# Patient Record
Sex: Female | Born: 1945
Health system: Southern US, Community
[De-identification: ages and names within clinical notes are randomized; demographics above are authoritative.]

## PROBLEM LIST (undated history)

## (undated) DIAGNOSIS — K861 Other chronic pancreatitis: Secondary | ICD-10-CM

## (undated) DIAGNOSIS — T4145XA Adverse effect of unspecified anesthetic, initial encounter: Secondary | ICD-10-CM

## (undated) DIAGNOSIS — T7840XA Allergy, unspecified, initial encounter: Secondary | ICD-10-CM

## (undated) DIAGNOSIS — E119 Type 2 diabetes mellitus without complications: Secondary | ICD-10-CM

## (undated) DIAGNOSIS — Z9889 Other specified postprocedural states: Secondary | ICD-10-CM

## (undated) DIAGNOSIS — T8859XA Other complications of anesthesia, initial encounter: Secondary | ICD-10-CM

## (undated) DIAGNOSIS — R112 Nausea with vomiting, unspecified: Secondary | ICD-10-CM

## (undated) DIAGNOSIS — J45909 Unspecified asthma, uncomplicated: Secondary | ICD-10-CM

## (undated) DIAGNOSIS — Z8739 Personal history of other diseases of the musculoskeletal system and connective tissue: Secondary | ICD-10-CM

## (undated) DIAGNOSIS — I10 Essential (primary) hypertension: Secondary | ICD-10-CM

## (undated) DIAGNOSIS — K859 Acute pancreatitis without necrosis or infection, unspecified: Secondary | ICD-10-CM

## (undated) DIAGNOSIS — E785 Hyperlipidemia, unspecified: Secondary | ICD-10-CM

## (undated) DIAGNOSIS — K219 Gastro-esophageal reflux disease without esophagitis: Secondary | ICD-10-CM

## (undated) HISTORY — PX: ABDOMINAL HYSTERECTOMY: SHX81

## (undated) HISTORY — DX: Hyperlipidemia, unspecified: E78.5

## (undated) HISTORY — PX: APPENDECTOMY: SHX54

## (undated) HISTORY — DX: Essential (primary) hypertension: I10

## (undated) HISTORY — PX: CHOLECYSTECTOMY: SHX55

## (undated) HISTORY — DX: Gastro-esophageal reflux disease without esophagitis: K21.9

## (undated) HISTORY — DX: Other chronic pancreatitis: K86.1

## (undated) HISTORY — DX: Allergy, unspecified, initial encounter: T78.40XA

## (undated) HISTORY — DX: Personal history of other diseases of the musculoskeletal system and connective tissue: Z87.39

## (undated) HISTORY — DX: Type 2 diabetes mellitus without complications: E11.9

## (undated) HISTORY — DX: Unspecified asthma, uncomplicated: J45.909

## (undated) HISTORY — PX: COLONOSCOPY: SHX174

---
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
## 1998-04-27 ENCOUNTER — Ambulatory Visit (HOSPITAL_COMMUNITY): Admission: RE | Admit: 1998-04-27 | Discharge: 1998-04-27 | Payer: Self-pay | Admitting: Internal Medicine

## 1998-04-27 ENCOUNTER — Encounter: Payer: Self-pay | Admitting: Internal Medicine

## 1998-04-28 ENCOUNTER — Emergency Department (HOSPITAL_COMMUNITY): Admission: EM | Admit: 1998-04-28 | Discharge: 1998-04-28 | Payer: Self-pay

## 1998-05-20 ENCOUNTER — Encounter: Admission: RE | Admit: 1998-05-20 | Discharge: 1998-06-12 | Payer: Self-pay | Admitting: *Deleted

## 2003-03-22 HISTORY — PX: OTHER SURGICAL HISTORY: SHX169

## 2003-05-05 ENCOUNTER — Encounter: Admission: RE | Admit: 2003-05-05 | Discharge: 2003-05-05 | Payer: Self-pay | Admitting: Family Medicine

## 2004-05-20 ENCOUNTER — Ambulatory Visit: Payer: Self-pay

## 2004-06-03 ENCOUNTER — Ambulatory Visit: Payer: Self-pay | Admitting: Oncology

## 2012-06-25 ENCOUNTER — Other Ambulatory Visit: Payer: Self-pay | Admitting: Family Medicine

## 2012-06-30 ENCOUNTER — Other Ambulatory Visit: Payer: Self-pay | Admitting: Family Medicine

## 2012-07-02 ENCOUNTER — Telehealth: Payer: Self-pay | Admitting: Physician Assistant

## 2012-07-02 MED ORDER — LISINOPRIL 20 MG PO TABS: 20.0000 mg | ORAL_TABLET | Freq: Every day | ORAL | Status: DC

## 2012-07-02 NOTE — Telephone Encounter (Signed)
Medication refilled per protocol.Patient needs to be seen before any further refills 

## 2012-09-25 ENCOUNTER — Other Ambulatory Visit: Payer: Self-pay | Admitting: Physician Assistant

## 2012-10-31 ENCOUNTER — Encounter: Payer: Self-pay | Admitting: Family Medicine

## 2012-10-31 ENCOUNTER — Other Ambulatory Visit: Payer: Self-pay | Admitting: Physician Assistant

## 2012-11-23 ENCOUNTER — Other Ambulatory Visit: Payer: Self-pay | Admitting: Physician Assistant

## 2012-11-23 NOTE — Telephone Encounter (Signed)
Medication refilled per protocol. 

## 2012-11-26 ENCOUNTER — Encounter: Payer: Self-pay | Admitting: Family Medicine

## 2012-11-28 ENCOUNTER — Encounter: Payer: Self-pay | Admitting: Physician Assistant

## 2012-11-28 ENCOUNTER — Ambulatory Visit (INDEPENDENT_AMBULATORY_CARE_PROVIDER_SITE_OTHER): Payer: Medicare Other | Admitting: Physician Assistant

## 2012-11-28 VITALS — BP 152/86 | HR 80 | Temp 98.0°F | Resp 18 | Ht 64.0 in | Wt 174.0 lb

## 2012-11-28 DIAGNOSIS — E119 Type 2 diabetes mellitus without complications: Secondary | ICD-10-CM

## 2012-11-28 DIAGNOSIS — K219 Gastro-esophageal reflux disease without esophagitis: Secondary | ICD-10-CM

## 2012-11-28 DIAGNOSIS — Z862 Personal history of diseases of the blood and blood-forming organs and certain disorders involving the immune mechanism: Secondary | ICD-10-CM

## 2012-11-28 DIAGNOSIS — T7840XA Allergy, unspecified, initial encounter: Secondary | ICD-10-CM | POA: Insufficient documentation

## 2012-11-28 DIAGNOSIS — Z8739 Personal history of other diseases of the musculoskeletal system and connective tissue: Secondary | ICD-10-CM

## 2012-11-28 DIAGNOSIS — E785 Hyperlipidemia, unspecified: Secondary | ICD-10-CM

## 2012-11-28 DIAGNOSIS — J45909 Unspecified asthma, uncomplicated: Secondary | ICD-10-CM | POA: Insufficient documentation

## 2012-11-28 DIAGNOSIS — E559 Vitamin D deficiency, unspecified: Secondary | ICD-10-CM

## 2012-11-28 DIAGNOSIS — I10 Essential (primary) hypertension: Secondary | ICD-10-CM

## 2012-11-28 LAB — COMPLETE METABOLIC PANEL WITH GFR
ALT: 21 U/L (ref 0–35)
CO2: 27 mEq/L (ref 19–32)
Calcium: 9.7 mg/dL (ref 8.4–10.5)
Chloride: 103 mEq/L (ref 96–112)
Creat: 0.68 mg/dL (ref 0.50–1.10)
GFR, Est African American: >89 mL/min
GFR, Est Non African American: >89 mL/min
Glucose, Bld: 109 mg/dL — ABNORMAL HIGH (ref 70–99)
Total Bilirubin: 0.4 mg/dL (ref 0.3–1.2)
Total Protein: 6.9 g/dL (ref 6.0–8.3)

## 2012-11-28 LAB — LIPID PANEL
HDL: 50 mg/dL (ref >39)
LDL Cholesterol: 68 mg/dL (ref 0–99)
Triglycerides: 363 mg/dL — ABNORMAL HIGH (ref <150)

## 2012-11-28 LAB — MICROALBUMIN, URINE: Microalb, Ur: 4.95 mg/dL — ABNORMAL HIGH (ref 0.00–1.89)

## 2012-11-28 LAB — HEMOGLOBIN A1C: Hgb A1c MFr Bld: 6.1 % — ABNORMAL HIGH (ref <5.7)

## 2012-11-28 NOTE — Progress Notes (Signed)
Patient ID: Jennifer Porter MRN: [id number], DOB: Sep 17, 1945, 67 y.o. Date of Encounter: @DATE @  Chief Complaint:  Chief Complaint  Patient presents with  . 6 month follow up    is fasting    HPI: 67 y.o. year old white obese female  presents for routine followup office visit.  Her last regular office visit with with me on 11/25/2011. She says that she did retire this past March. 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 She says that she does walk for 15-20 minutes once a day either in the morning or the evening depending on the weather etc. As well she is staying active with taking care of her chickens and also gardening and landscaping. As well she says that she is careful with her diet. She says she really has never been one to " snack and munch" and tries to watch what she does eat for meals.  She states that she checks her blood pressure periodically. She usually gets good readings. She states that she has not yet taken her medication today.  She has no complaints today. She has no chest pressure tightness or heaviness with her 20 minute walk.   Past Medical History  Diagnosis Date  . Hypertension   . GERD (gastroesophageal reflux disease)     hiatal hernia  . Diabetes mellitus without complication   . Allergy     seasonal  . Asthma   . H/O: gout   . Hyperlipidemia      Home Meds: See attached medication section for current medication list. Any medications entered into computer today will not appear on this note's list. The medications listed below were entered prior to today. Current Outpatient Prescriptions on File Prior to Visit  Medication Sig Dispense Refill  . Choline Fenofibrate (FENOFIBRIC ACID) 135 MG CPDR TAKE ONE CAPSULE BY MOUTH AT BEDTIME  90 capsule  0  . lisinopril (PRINIVIL,ZESTRIL) 20 MG tablet TAKE ONE TABLET   90 tablet  0   No current facility-administered medications on file prior to visit.    Allergies:  Allergies  Allergen Reactions   . Codeine Nausea And Vomiting  . Macrobid [Nitrofurantoin] Hives    History   Social History  . Marital Status: Single    Spouse Name: N/A    Number of Children: N/A  . Years of Education: N/A   Occupational History  . Not on file.   Social History Main Topics  . Smoking status: Never Smoker   . Smokeless tobacco: Never Used  . Alcohol Use: No  . Drug Use: No  . Sexual Activity: Not on file   Other Topics Concern  . Not on file   Social History Narrative   Retired March 2014.   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 Stroke Mother   . Diabetes Brother   . Cancer Neg Hx   . Heart disease Neg Hx      Review of Systems:  See HPI for pertinent ROS. All other ROS negative.    Physical Exam: Blood pressure 152/86, pulse 80, temperature 98 F (36.7 C), temperature source Oral, resp. rate 18, height 5\' 4"  (1.626 m), weight 174 lb (78.926 kg)., Body mass index is 29.85 kg/(m^2). General: Obese white female .Appears in no acute distress. Neck: Supple. No thyromegaly. No lymphadenopathy. No carotid bruits. Lungs: Clear bilaterally to auscultation without wheezes, rales, or rhonchi. Breathing is unlabored. Heart: RRR with  S1 S2. No murmurs, rubs, or gallops. Abdomen: Soft, non-tender, non-distended with normoactive bowel sounds. No hepatomegaly. No rebound/guarding. No obvious abdominal masses. Musculoskeletal:  Strength and tone normal for age. Extremities/Skin: Warm and dry. No clubbing or cyanosis. No edema. No rashes or suspicious lesions. Neuro: Alert and oriented X 3. Moves all extremities spontaneously. Gait is normal. CNII-XII grossly in tact. Psych:  Responds to questions appropriately with a normal affect. Eidetic for exam: See attached section. Inspection is normal. Sensation is normal there is trace to no palpable dorsalis pedis or posterior tibial pulses bilaterally.      ASSESSMENT AND PLAN:  67 y.o.  year old female with  1. Diabetes mellitus without complication - COMPLETE METABOLIC PANEL WITH GFR - Hemoglobin A1c - Microalbumin, urine-- last microalbumin was May 2013  She has no symptoms of peripheral neuropathy. Discuss diabetic eye exam with her at her next visit with me.  2. Hypertension I discussed with her the blood pressure reading today is elevated. However her last reading here have been good. As well she checks it regularly at home and gets good readings. She has not yet taken her medication today the last dose was greater than 24 hours ago. We'll monitor for now. Blood pressure elevated again at the next office visit then we'll increase medicines. - COMPLETE METABOLIC PANEL WITH GFR  3. Hyperlipidemia - COMPLETE METABOLIC PANEL WITH GFR - Lipid panel  4. GERD (gastroesophageal reflux disease)  5. H/O: gout  6. Vitamin D deficiency - Vit D  25 hydroxy (rtn osteoporosis monitoring)  7 hormone replacement therapy: We have discussed risk multiple times including cardiovascular risk blood clot risk and risk of breast and uterine cancer. She is aware of risk. Has opted to continue the medication. Without the medication she has severe hot flashes and other symptoms.  #8 she has a history of a hysterectomy. This was not performed secondary to any cancer. Therefore she does not need repeat Pap smear.  #9 mammogram: She says she had this performed in the spring of 2014. He did have to go back for a repeat test but this was negative.  10 DEXA scan: August 2011: Normal  11 colonoscopy: July 2008. Repeat 10 years.  12: Immunizations: Pneumovax given here November 2012 I discussed Fosamax with her November 2012. Have discussed it again since then. She kept forgetting to check with insurance regarding coverage. Today she is afraid that the insurance may have changed because of her recent retirement. She will contact the insurance again and then followup with [place] regarding  this  May 2013 CBC and TSH were performed and both were normal.  Needs to have regular office visit in 3 months.  Murray Hodgkins [place], [place], [facility] 11/28/2012 2:59 PM

## 2012-11-29 LAB — VITAMIN D 25 HYDROXY (VIT D DEFICIENCY, FRACTURES): Vit D, 25-Hydroxy: 45 ng/mL (ref 30–89)

## 2012-12-13 ENCOUNTER — Other Ambulatory Visit: Payer: Self-pay | Admitting: Physician Assistant

## 2012-12-13 NOTE — Telephone Encounter (Signed)
Medication refilled per protocol. 

## 2012-12-24 ENCOUNTER — Telehealth: Payer: Self-pay | Admitting: Family Medicine

## 2012-12-24 MED ORDER — OMEPRAZOLE 20 MG PO CPDR: 20.0000 mg | DELAYED_RELEASE_CAPSULE | Freq: Every day | ORAL | Status: DC

## 2012-12-24 MED ORDER — DICLOFENAC SODIUM ER 100 MG PO TB24: 100.0000 mg | ORAL_TABLET | Freq: Every day | ORAL | Status: DC

## 2012-12-24 NOTE — Telephone Encounter (Signed)
done

## 2012-12-24 NOTE — Telephone Encounter (Signed)
May refill both medications for one year.   

## 2012-12-24 NOTE — Telephone Encounter (Signed)
Pt has Diclofenac XR 100mg   One daily PRN       And Omeprazole 20mg  one daily as needed  Pt is requesting refills

## 2012-12-25 ENCOUNTER — Other Ambulatory Visit: Payer: Self-pay | Admitting: Physician Assistant

## 2012-12-25 NOTE — Telephone Encounter (Signed)
Medication refilled per protocol. 

## 2013-01-07 ENCOUNTER — Other Ambulatory Visit: Payer: Self-pay | Admitting: Family Medicine

## 2013-01-07 NOTE — Telephone Encounter (Signed)
Pt transferring all her medications to [facility] from [facility].  Does not need refills now. Just want to make  Pharmacy her Primary Pharmacy  DONE

## 2013-01-10 ENCOUNTER — Telehealth: Payer: Self-pay | Admitting: Family Medicine

## 2013-01-10 NOTE — Telephone Encounter (Signed)
Patient has transferred her presciptions for Metformin,Fenofibrate, Fluticasone NS and Gabapentin to [facility]

## 2013-01-11 ENCOUNTER — Telehealth: Payer: Self-pay | Admitting: Physician Assistant

## 2013-01-11 MED ORDER — GABAPENTIN 300 MG PO CAPS: 300.0000 mg | ORAL_CAPSULE | Freq: Two times a day (BID) | ORAL | Status: DC

## 2013-01-11 MED ORDER — METFORMIN HCL 500 MG PO TABS: 500.0000 mg | ORAL_TABLET | Freq: Two times a day (BID) | ORAL | Status: DC

## 2013-01-11 NOTE — Telephone Encounter (Signed)
gabapentin refilled  All her other med's have been refilled in the past month

## 2013-01-11 NOTE — Telephone Encounter (Signed)
Patient needs all meds refills except Metformin it has already been filled.  [facility]  [phone]

## 2013-01-11 NOTE — Telephone Encounter (Signed)
Patient needs her metformin 500 mg. To be called in to [facility]   [phone]

## 2013-01-11 NOTE — Telephone Encounter (Signed)
Done

## 2013-01-15 ENCOUNTER — Other Ambulatory Visit: Payer: Self-pay | Admitting: Family Medicine

## 2013-01-15 ENCOUNTER — Telehealth: Payer: Self-pay | Admitting: Family Medicine

## 2013-01-15 MED ORDER — FENOFIBRIC ACID 135 MG PO CPDR: 1.0000 | DELAYED_RELEASE_CAPSULE | Freq: Every day | ORAL | Status: DC

## 2013-01-15 MED ORDER — FLUTICASONE PROPIONATE 50 MCG/ACT NA SUSP: 2.0000 | Freq: Every day | NASAL | Status: DC

## 2013-01-15 NOTE — Telephone Encounter (Deleted)
[facility]  . They are needing the correct SIG: for

## 2013-01-15 NOTE — Telephone Encounter (Signed)
Medication refilled per protocol. 

## 2013-02-22 ENCOUNTER — Ambulatory Visit (INDEPENDENT_AMBULATORY_CARE_PROVIDER_SITE_OTHER): Payer: Medicare Other | Admitting: Family Medicine

## 2013-02-22 DIAGNOSIS — Z23 Encounter for immunization: Secondary | ICD-10-CM

## 2013-04-18 ENCOUNTER — Ambulatory Visit (INDEPENDENT_AMBULATORY_CARE_PROVIDER_SITE_OTHER): Payer: Medicare Other | Admitting: Physician Assistant

## 2013-04-18 ENCOUNTER — Encounter: Payer: Self-pay | Admitting: Physician Assistant

## 2013-04-18 VITALS — BP 164/94 | HR 96 | Temp 97.8°F | Resp 18 | Wt 174.0 lb

## 2013-04-18 DIAGNOSIS — R0789 Other chest pain: Secondary | ICD-10-CM

## 2013-04-18 DIAGNOSIS — R071 Chest pain on breathing: Secondary | ICD-10-CM

## 2013-04-18 MED ORDER — DICLOFENAC SODIUM ER 100 MG PO TB24: 100.0000 mg | ORAL_TABLET | Freq: Every day | ORAL | Status: DC | PRN

## 2013-04-18 NOTE — Progress Notes (Signed)
Patient ID: Jennifer Porter MRN: [id number], DOB: 03-Oct-1945, 68 y.o. Date of Encounter: @DATE @  Chief Complaint:  Chief Complaint  Patient presents with  . c/o chest pain x 2 days    ??reflux  head neck,back  pain  took Prilosec no help    HPI: 68 y.o. year old obese white female  presents with following symptoms:  She states that for about one week she was having some discomfort in her abdomen and had been using some Prilosec and Gaviscon for this. Then, she states that on Tuesday evening (04/16/2013), she started to also have discomfort into her chest. Because of the symptoms she had been having over the past week,  she felt this must be heartburn so she took Prilosec. However she got no relief with this.  On the following day, Wednesday, she changed and took Prevacid and also decreased her eating to eating just crackers.  However, the pain still did not improve. She states that there has been some amount of discomfort across her chest constantly ever since Tuesday evening. The pain has not been coming and going, but has been constant. She also notes that on Tuesday she took 2 walks with no problems. Yesterday  she was afraid to exert herself as much but she still walked for 5 or 10 minutes. She states  that the chest discomfort was the same while she was doing that exertion as it had been at rest. She had no increased chest discomfort and no increased dyspnea on exertion with walking.   Past Medical History  Diagnosis Date  . Hypertension   . GERD (gastroesophageal reflux disease)     hiatal hernia  . Diabetes mellitus without complication   . Allergy     seasonal  . Asthma   . H/O: gout   . Hyperlipidemia      Home Meds: See attached medication section for current medication list. Any medications entered into computer today will not appear on this note's list. The medications listed below were entered prior to today. Current Outpatient Prescriptions on File Prior to  Visit  Medication Sig Dispense Refill  . aspirin 81 MG tablet Take 81 mg by mouth daily.      . calcium carbonate (OS-CAL) 600 MG TABS tablet Take 600 mg by mouth daily with breakfast.      . Choline Fenofibrate (FENOFIBRIC ACID) 135 MG CPDR Take 1 capsule by mouth at bedtime.  90 capsule  1  . lisinopril (PRINIVIL,ZESTRIL) 20 MG tablet Take 1 tablet (20 mg total) by mouth daily.  90 tablet  1  . metFORMIN (GLUCOPHAGE) 500 MG tablet Take 1 tablet (500 mg total) by mouth 2 (two) times daily with a meal.  60 tablet  5  . Multiple Vitamin (MULTIVITAMIN) tablet Take 1 tablet by mouth daily.      Marland Kitchen omeprazole (PRILOSEC) 20 MG capsule Take 1 capsule (20 mg total) by mouth daily.  30 capsule  6  . PREMARIN 0.625 MG tablet TAKE ONE TABLET BY MOUTH EVERY DAY  30 tablet  11   No current facility-administered medications on file prior to visit.    Allergies:  Allergies  Allergen Reactions  . Codeine Nausea And Vomiting  . Macrobid [Nitrofurantoin] Hives    History   Social History  . Marital Status: Single    Spouse Name: N/A    Number of Children: N/A  . Years of Education: N/A   Occupational History  . Not on file.  Social History Main Topics  . Smoking status: Never Smoker   . Smokeless tobacco: Never Used  . Alcohol Use: No  . Drug Use: No  . Sexual Activity: Not on file   Other Topics Concern  . Not on file   Social History Narrative   Retired March 2014.   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 Stroke Mother   . Diabetes Brother   . Cancer Neg Hx   . Heart disease Neg Hx      Review of Systems:  See HPI for pertinent ROS. All other ROS negative.    Physical Exam: Blood pressure 144/84, pulse 96, temperature 97.8 F (36.6 C), temperature source Oral, resp. rate 18, weight 174 lb (78.926 kg), SpO2 99.00%., Body mass index is 29.85 kg/(m^2). General: Obese WF. Appears in no acute distress. Neck: Supple. No  thyromegaly. No lymphadenopathy. Lungs: Clear bilaterally to auscultation without wheezes, rales, or rhonchi. Breathing is unlabored. Heart: RRR with S1 S2. No murmurs, rubs, or gallops. Chest Wall: Extremely tender with palpation of entire chest wall, bilaterally. Extreme tenderness throughout the right chest, above the level of the breast, as well as under the breast. Also tender in the peristernal area bilaterally. Also tender with palpation of the left chest wall. Also very tender with palpation of the periscapular area of her back. Abdomen: Soft, non-tender, non-distended with normoactive bowel sounds. No hepatomegaly. No rebound/guarding. No obvious abdominal masses. Musculoskeletal:  Strength and tone normal for age. Extremities/Skin: Warm and dry.  No rashes or suspicious lesions. Neuro: Alert and oriented X 3. Moves all extremities spontaneously. Gait is normal. CNII-XII grossly in tact. Psych:  Responds to questions appropriately with a normal affect.   12-lead EKG shows normal sinus rhythm with nonspecific ST-T changes. She does have poor anterior R-wave progression but this was present on her EKG in her chart dated 05/18/2004  ASSESSMENT AND PLAN:  68 y.o. year old female with  1. Chest wall pain I reviewed her medication list and includes diclofenac. However she states she has not been taking meds in quite some time. She says that she only uses that when she is having severe back pain. I told her to start taking this daily for the next week--take it with food. We discussed that she does have diabetes, hypertension, hyperlipidemia which are put her at increased risk for developing heart disease. However, I discussed that her symptoms and her physical exam are not consistent with this current chest pain being secondary to CAD. However, if she were to develop different symptoms with exertional chest discomfort or exertional dyspnea, then followup immediately.   - Diclofenac Sodium CR 100  MG 24 hr tablet; Take 1 tablet (100 mg total) by mouth daily as needed.  Dispense: 30 tablet; Refill: 2   Signed, [address] [place], [place], [facility] 04/18/2013 1:52 PM

## 2013-05-09 ENCOUNTER — Encounter: Payer: Self-pay | Admitting: Physician Assistant

## 2013-06-06 ENCOUNTER — Ambulatory Visit (INDEPENDENT_AMBULATORY_CARE_PROVIDER_SITE_OTHER): Payer: Medicare Other | Admitting: Physician Assistant

## 2013-06-06 ENCOUNTER — Encounter: Payer: Self-pay | Admitting: Physician Assistant

## 2013-06-06 VITALS — BP 154/86 | HR 80 | Temp 97.8°F | Resp 18 | Ht 63.25 in | Wt 168.0 lb

## 2013-06-06 DIAGNOSIS — E119 Type 2 diabetes mellitus without complications: Secondary | ICD-10-CM

## 2013-06-06 DIAGNOSIS — J45909 Unspecified asthma, uncomplicated: Secondary | ICD-10-CM

## 2013-06-06 DIAGNOSIS — I10 Essential (primary) hypertension: Secondary | ICD-10-CM

## 2013-06-06 DIAGNOSIS — Z862 Personal history of diseases of the blood and blood-forming organs and certain disorders involving the immune mechanism: Secondary | ICD-10-CM

## 2013-06-06 DIAGNOSIS — Z8639 Personal history of other endocrine, nutritional and metabolic disease: Secondary | ICD-10-CM

## 2013-06-06 DIAGNOSIS — Z8739 Personal history of other diseases of the musculoskeletal system and connective tissue: Secondary | ICD-10-CM

## 2013-06-06 DIAGNOSIS — K219 Gastro-esophageal reflux disease without esophagitis: Secondary | ICD-10-CM

## 2013-06-06 DIAGNOSIS — Z23 Encounter for immunization: Secondary | ICD-10-CM

## 2013-06-06 DIAGNOSIS — E785 Hyperlipidemia, unspecified: Secondary | ICD-10-CM

## 2013-06-06 DIAGNOSIS — T7840XA Allergy, unspecified, initial encounter: Secondary | ICD-10-CM

## 2013-06-06 LAB — LIPID PANEL
Cholesterol: 183 mg/dL (ref 0–200)
HDL: 45 mg/dL (ref >39)
LDL Cholesterol: 93 mg/dL (ref 0–99)
Total CHOL/HDL Ratio: 4.1 Ratio
Triglycerides: 226 mg/dL — ABNORMAL HIGH (ref <150)
VLDL: 45 mg/dL — ABNORMAL HIGH (ref 0–40)

## 2013-06-06 LAB — COMPLETE METABOLIC PANEL WITH GFR
ALBUMIN: 4.2 g/dL (ref 3.5–5.2)
ALT: 13 U/L (ref 0–35)
AST: 19 U/L (ref 0–37)
Alkaline Phosphatase: 16 U/L — ABNORMAL LOW (ref 39–117)
BUN: 13 mg/dL (ref 6–23)
CO2: 25 meq/L (ref 19–32)
Calcium: 9.6 mg/dL (ref 8.4–10.5)
Chloride: 102 mEq/L (ref 96–112)
Creat: 0.65 mg/dL (ref 0.50–1.10)
GLUCOSE: 121 mg/dL — AB (ref 70–99)
POTASSIUM: 4.3 meq/L (ref 3.5–5.3)
Sodium: 142 mEq/L (ref 135–145)
Total Bilirubin: 0.5 mg/dL (ref 0.2–1.2)
Total Protein: 6.5 g/dL (ref 6.0–8.3)

## 2013-06-06 LAB — HEMOGLOBIN A1C
HEMOGLOBIN A1C: 6.4 % — AB (ref <5.7)
MEAN PLASMA GLUCOSE: 137 mg/dL — AB (ref <117)

## 2013-06-06 MED ORDER — AMLODIPINE BESYLATE 5 MG PO TABS: 5.0000 mg | ORAL_TABLET | Freq: Every day | ORAL | Status: DC

## 2013-06-06 NOTE — Progress Notes (Signed)
Patient ID: Jennifer Porter MRN: [id number], DOB: 25-Dec-1945, 68 y.o. Date of Encounter: @DATE @  Chief Complaint:  Chief Complaint  Patient presents with  . 6 mth diab check    is fasting    HPI: 68 y.o. year old female  presents for [name].  She has no complaints today. 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 Today she says that she and her husband will be getting busier coming up soon--planting their garden. Says that he also plans to buy 6 more chickens. Says that she already has a 6 chickens.  As well she has been walking 15-20 minutes once a day either in the morning or the evening depending on the weather weather etc.  As well she has told me in the past that she is careful with her diet. Says that she has never really been one to snack or munch and tries to watch what she eats for meals.  She is taking her lisinopril as directed. No adverse effects and no lightheadedness or edema.  She reports that even with exertion she has no chest pressure heaviness tightness or increased dyspnea.   Past Medical History  Diagnosis Date  . Hypertension   . GERD (gastroesophageal reflux disease)     hiatal hernia  . Diabetes mellitus without complication   . Allergy     seasonal  . Asthma   . H/O: gout   . Hyperlipidemia      Home Meds: See attached medication section for current medication list. Any medications entered into computer today will not appear on this note's list. The medications listed below were entered prior to today. Current Outpatient Prescriptions on File Prior to Visit  Medication Sig Dispense Refill  . aspirin 81 MG tablet Take 81 mg by mouth daily.      . calcium carbonate (OS-CAL) 600 MG TABS tablet Take 600 mg by mouth daily with breakfast.      . Choline Fenofibrate (FENOFIBRIC ACID) 135 MG CPDR Take 1 capsule by mouth at bedtime.  90 capsule  1  . Diclofenac Sodium CR 100 MG 24 hr tablet Take 1 tablet (100 mg total) by  mouth daily as needed.  30 tablet  2  . fluticasone (FLONASE) 50 MCG/ACT nasal spray Place 2 sprays into the nose daily as needed.      . gabapentin (NEURONTIN) 300 MG capsule Take 300 mg by mouth daily.      Marland Kitchen lisinopril (PRINIVIL,ZESTRIL) 20 MG tablet Take 1 tablet (20 mg total) by mouth daily.  90 tablet  1  . metFORMIN (GLUCOPHAGE) 500 MG tablet Take 1 tablet (500 mg total) by mouth 2 (two) times daily with a meal.  60 tablet  5  . Multiple Vitamin (MULTIVITAMIN) tablet Take 1 tablet by mouth daily.      Marland Kitchen omeprazole (PRILOSEC) 20 MG capsule Take 1 capsule (20 mg total) by mouth daily.  30 capsule  6  . PREMARIN 0.625 MG tablet TAKE ONE TABLET BY MOUTH EVERY DAY  30 tablet  11   No current facility-administered medications on file prior to visit.    Allergies:  Allergies  Allergen Reactions  . Codeine Nausea And Vomiting  . Macrobid [Nitrofurantoin] Hives    History   Social History  . Marital Status: Single    Spouse Name: N/A    Number of Children: N/A  . Years of Education: N/A   Occupational History  . Not on file.  Social History Main Topics  . Smoking status: Never Smoker   . Smokeless tobacco: Never Used  . Alcohol Use: No  . Drug Use: No  . Sexual Activity: Not on file   Other Topics Concern  . Not on file   Social History Narrative   Retired March 2014.   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 Stroke Mother   . Diabetes Brother   . Cancer Neg Hx   . Heart disease Neg Hx      Review of Systems:  See HPI for pertinent ROS. All other ROS negative.    Physical Exam: Blood pressure 154/86, pulse 80, temperature 97.8 F (36.6 C), temperature source Oral, resp. rate 18, height 5' 3.25" (1.607 m), weight 168 lb (76.204 kg)., Body mass index is 29.51 kg/(m^2). General: Obese WF. Appears in no acute distress. Neck: Supple. No thyromegaly. No lymphadenopathy. No carotid bruits. Lungs: Clear  bilaterally to auscultation without wheezes, rales, or rhonchi. Breathing is unlabored. Heart: RRR with S1 S2. No murmurs, rubs, or gallops. Abdomen: Soft, non-tender, non-distended with normoactive bowel sounds. No hepatomegaly. No rebound/guarding. No obvious abdominal masses. Musculoskeletal:  Strength and tone normal for age. Extremities/Skin: Warm and dry. No clubbing or cyanosis. No edema. No rashes or suspicious lesions. Neuro: Alert and oriented X 3. Moves all extremities spontaneously. Gait is normal. CNII-XII grossly in tact. Psych:  Responds to questions appropriately with a normal affect. Diabetic Foot exam: Inspection is normal. Sensation is normal and intact. There is trace palpable dorsalis pedis or posterior tibial pulses bilaterally.      ASSESSMENT AND PLAN:  68 y.o. year old female with  1. Diabetes mellitus without complication  Last microalbumin was 11/2012.  She is on ACE inhibitor. Lisinopril 20 mg daily. She is on no statin. However last LDL is at 68 which is goal. She is on aspirin 81 mg daily.  She has no symptoms of peripheral neuropathy. She has had no diabetic eye exam in over 2 years. He is agreeable for me to do referral to, which is today.  - COMPLETE METABOLIC PANEL WITH GFR - Hemoglobin A1c - Ambulatory referral to Ophthalmology  2. Hypertension Blood Pressure was elevated at last visit and is elevated again today. Will add Norvasc 5 mg. Cont.  lisinopril 20 mg. - COMPLETE METABOLIC PANEL WITH GFR - amLODipine (NORVASC) 5 MG tablet; Take 1 tablet (5 mg total) by mouth daily.  Dispense: 30 tablet; Refill: 3  3. Hyperlipidemia On no Statin. Last LDL 68. - COMPLETE METABOLIC PANEL WITH GFR - Lipid panel  4. GERD (gastroesophageal reflux disease)  5. Allergy  6. Asthma  7. H/O: gout No Recent flare.  8. Hormone replacement therapy: We have discussed risk multiple times including cardiovascular risk of blood clots risk and risk of breast  cancer and uterine cancer. She is aware of risk. Has opted to continue the medication. Without the medication she has severe hot flashes and other symptoms.  9. Has history of hysterectomy. This was not performed secondary to any cancer. Therefore she does not need repeat Pap smear.  10. Mammogram: She had this performed in 2014. Aware to repeat when due this year.  11.  DEXA.: August 2011: Normal  12. Colonoscopy: July 2008. Repeat 10 years.  13. Immunizations: Pneumonia vaccine:              --Pneumovax was given here in November 2012. We'll update  this into the Epic system today.  --Give  Prevnar 13 today. Zostavax: Discussed this multiple times but she still has not followed up with this. Flu vaccine--N/A Tetanus: Will not give as she has Medicare  Can wait 6 months for followup visit if labs are all stable. Otherwise will need followup 3 months.  Marin Olp [place], [place], [facility] 06/06/2013 8:26 AM

## 2013-06-06 NOTE — Addendum Note (Signed)
Addended by: Olena Mater on: 06/06/2013 08:50 AM   Modules accepted: Orders

## 2013-06-07 ENCOUNTER — Other Ambulatory Visit: Payer: Self-pay | Admitting: Physician Assistant

## 2013-06-07 DIAGNOSIS — E119 Type 2 diabetes mellitus without complications: Secondary | ICD-10-CM

## 2013-06-07 NOTE — Telephone Encounter (Signed)
Medication refilled per protocol. 

## 2013-06-17 ENCOUNTER — Other Ambulatory Visit: Payer: Self-pay | Admitting: Physician Assistant

## 2013-06-17 NOTE — Telephone Encounter (Signed)
Refill appropriate and filled per protocol. 

## 2013-07-04 ENCOUNTER — Encounter: Payer: Self-pay | Admitting: Family Medicine

## 2013-07-04 LAB — HM DIABETES EYE EXAM

## 2013-07-17 ENCOUNTER — Encounter: Payer: Self-pay | Admitting: *Deleted

## 2013-08-13 ENCOUNTER — Other Ambulatory Visit: Payer: Self-pay | Admitting: Physician Assistant

## 2013-08-13 NOTE — Telephone Encounter (Signed)
Medication refilled per protocol. 

## 2013-09-03 ENCOUNTER — Other Ambulatory Visit: Payer: Self-pay | Admitting: Family Medicine

## 2013-09-28 ENCOUNTER — Other Ambulatory Visit: Payer: Self-pay | Admitting: Family Medicine

## 2013-09-28 DIAGNOSIS — Z79899 Other long term (current) drug therapy: Secondary | ICD-10-CM

## 2013-09-28 DIAGNOSIS — E785 Hyperlipidemia, unspecified: Secondary | ICD-10-CM

## 2013-09-28 DIAGNOSIS — E119 Type 2 diabetes mellitus without complications: Secondary | ICD-10-CM

## 2013-09-28 DIAGNOSIS — I10 Essential (primary) hypertension: Secondary | ICD-10-CM

## 2013-09-30 ENCOUNTER — Other Ambulatory Visit: Payer: Self-pay | Admitting: Physician Assistant

## 2013-11-28 ENCOUNTER — Other Ambulatory Visit: Payer: Self-pay | Admitting: Physician Assistant

## 2013-12-02 ENCOUNTER — Other Ambulatory Visit: Payer: Self-pay | Admitting: *Deleted

## 2013-12-02 MED ORDER — METFORMIN HCL 500 MG PO TABS: ORAL_TABLET | ORAL | Status: DC

## 2013-12-02 NOTE — Telephone Encounter (Signed)
Received call from patient.   Reports that she was informed that medication refill had been denied. States that she has appointment scheduled for next week. Appointment confirmed.   Prescription sent to pharmacy.

## 2013-12-05 ENCOUNTER — Other Ambulatory Visit: Payer: Medicare Other

## 2013-12-05 DIAGNOSIS — Z79899 Other long term (current) drug therapy: Secondary | ICD-10-CM

## 2013-12-05 DIAGNOSIS — E785 Hyperlipidemia, unspecified: Secondary | ICD-10-CM

## 2013-12-05 DIAGNOSIS — E119 Type 2 diabetes mellitus without complications: Secondary | ICD-10-CM

## 2013-12-05 DIAGNOSIS — I10 Essential (primary) hypertension: Secondary | ICD-10-CM

## 2013-12-05 LAB — CBC WITH DIFFERENTIAL/PLATELET
Basophils Absolute: 0.1 10*3/uL (ref 0.0–0.1)
Basophils Relative: 1 % (ref 0–1)
EOS ABS: 0.5 10*3/uL (ref 0.0–0.7)
EOS PCT: 7 % — AB (ref 0–5)
HCT: 38.2 % (ref 36.0–46.0)
Hemoglobin: 13.2 g/dL (ref 12.0–15.0)
LYMPHS ABS: 2.2 10*3/uL (ref 0.7–4.0)
Lymphocytes Relative: 28 % (ref 12–46)
MCH: 30.6 pg (ref 26.0–34.0)
MCHC: 34.6 g/dL (ref 30.0–36.0)
MCV: 88.4 fL (ref 78.0–100.0)
MONO ABS: 0.6 10*3/uL (ref 0.1–1.0)
Monocytes Relative: 8 % (ref 3–12)
Neutro Abs: 4.4 10*3/uL (ref 1.7–7.7)
Neutrophils Relative %: 56 % (ref 43–77)
PLATELETS: 287 10*3/uL (ref 150–400)
RBC: 4.32 MIL/uL (ref 3.87–5.11)
RDW: 13.6 % (ref 11.5–15.5)
WBC: 7.8 10*3/uL (ref 4.0–10.5)

## 2013-12-05 LAB — COMPREHENSIVE METABOLIC PANEL
ALT: 21 U/L (ref 0–35)
AST: 20 U/L (ref 0–37)
Albumin: 4.3 g/dL (ref 3.5–5.2)
Alkaline Phosphatase: 18 U/L — ABNORMAL LOW (ref 39–117)
BUN: 20 mg/dL (ref 6–23)
CHLORIDE: 104 meq/L (ref 96–112)
CO2: 22 mEq/L (ref 19–32)
CREATININE: 0.81 mg/dL (ref 0.50–1.10)
Calcium: 9.3 mg/dL (ref 8.4–10.5)
GLUCOSE: 128 mg/dL — AB (ref 70–99)
Potassium: 4.4 mEq/L (ref 3.5–5.3)
Sodium: 138 mEq/L (ref 135–145)
Total Bilirubin: 0.5 mg/dL (ref 0.2–1.2)
Total Protein: 6.7 g/dL (ref 6.0–8.3)

## 2013-12-05 LAB — LIPID PANEL
CHOLESTEROL: 174 mg/dL (ref 0–200)
HDL: 49 mg/dL (ref >39)
LDL Cholesterol: 76 mg/dL (ref 0–99)
TRIGLYCERIDES: 246 mg/dL — AB (ref <150)
Total CHOL/HDL Ratio: 3.6 Ratio
VLDL: 49 mg/dL — AB (ref 0–40)

## 2013-12-05 LAB — HEMOGLOBIN A1C
Hgb A1c MFr Bld: 6.5 % — ABNORMAL HIGH (ref <5.7)
Mean Plasma Glucose: 140 mg/dL — ABNORMAL HIGH (ref <117)

## 2013-12-09 ENCOUNTER — Ambulatory Visit: Payer: Self-pay | Admitting: Physician Assistant

## 2013-12-11 ENCOUNTER — Encounter: Payer: Self-pay | Admitting: Physician Assistant

## 2013-12-11 ENCOUNTER — Ambulatory Visit (INDEPENDENT_AMBULATORY_CARE_PROVIDER_SITE_OTHER): Payer: Medicare Other | Admitting: Physician Assistant

## 2013-12-11 VITALS — BP 126/72 | HR 96 | Temp 98.0°F | Resp 18 | Wt 169.0 lb

## 2013-12-11 DIAGNOSIS — K219 Gastro-esophageal reflux disease without esophagitis: Secondary | ICD-10-CM

## 2013-12-11 DIAGNOSIS — J45909 Unspecified asthma, uncomplicated: Secondary | ICD-10-CM

## 2013-12-11 DIAGNOSIS — J0100 Acute maxillary sinusitis, unspecified: Secondary | ICD-10-CM

## 2013-12-11 DIAGNOSIS — E119 Type 2 diabetes mellitus without complications: Secondary | ICD-10-CM

## 2013-12-11 DIAGNOSIS — I10 Essential (primary) hypertension: Secondary | ICD-10-CM

## 2013-12-11 DIAGNOSIS — Z862 Personal history of diseases of the blood and blood-forming organs and certain disorders involving the immune mechanism: Secondary | ICD-10-CM

## 2013-12-11 DIAGNOSIS — Z8739 Personal history of other diseases of the musculoskeletal system and connective tissue: Secondary | ICD-10-CM

## 2013-12-11 DIAGNOSIS — E785 Hyperlipidemia, unspecified: Secondary | ICD-10-CM

## 2013-12-11 DIAGNOSIS — Z8639 Personal history of other endocrine, nutritional and metabolic disease: Secondary | ICD-10-CM

## 2013-12-11 DIAGNOSIS — J4520 Mild intermittent asthma, uncomplicated: Secondary | ICD-10-CM

## 2013-12-11 LAB — MICROALBUMIN, URINE: Microalb, Ur: 6.7 mg/dL — ABNORMAL HIGH (ref <2.0)

## 2013-12-11 MED ORDER — AMOXICILLIN-POT CLAVULANATE 875-125 MG PO TABS: 1.0000 | ORAL_TABLET | Freq: Two times a day (BID) | ORAL | Status: DC

## 2013-12-11 NOTE — Progress Notes (Signed)
Patient ID: LISSETT FAVORITE MRN: [id number], DOB: 1946-01-17, 68 y.o. Date of Encounter: @DATE @  Chief Complaint:  Chief Complaint  Patient presents with  . 6 month follow up    has had labs    HPI: 68 y.o. year old female  presents for Icard.  Today 12/11/13 she states that she has had a sinus infection for about a week now. Says that she gets drainage down her throat that causes her to cough. Says that sometimes she is able to cough this out and is thick dark mucus. Says that she's been having a lot of pressure and pain in both of her cheek areas. No other complaints. Otherwise she has been doing well since her last visit here 06/06/13.  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  At [place] 05/2013 she said that she and her husband would be getting busier coming up soon--planting their garden. Said that they also planned to buy 6 more chickens. Said that she already had 6 chickens.  Today she says she often has been volunteering several days at the school helping out. Continues to be active outside in the yard.  As well, at office visit 11/2013 she says that she has continued to do her walking--been walking 15-20 minutes once a day either in the morning or the evening depending on the weather weather etc.  As well she has told me in the past that she is careful with her diet. Says that she has never really been one to snack or munch and tries to watch what she eats for meals.  She is taking her lisinopril as directed. No adverse effects and no lightheadedness or edema.  She reports that even with exertion she has no chest pressure heaviness tightness or increased dyspnea.   Past Medical History  Diagnosis Date  . Hypertension   . GERD (gastroesophageal reflux disease)     hiatal hernia  . Diabetes mellitus without complication   . Allergy     seasonal  . Asthma   . H/O: gout   . Hyperlipidemia      Home Meds:  Outpatient Prescriptions Prior to  Visit  Medication Sig Dispense Refill  . amLODipine (NORVASC) 5 MG tablet TAKE ONE (1) TABLET EACH DAY  30 tablet  2  . aspirin 81 MG tablet Take 81 mg by mouth daily.      . calcium carbonate (OS-CAL) 600 MG TABS tablet Take 600 mg by mouth daily with breakfast.      . Choline Fenofibrate (FENOFIBRIC ACID) 135 MG CPDR TAKE ONE CAPSULE BY MOUTH EVERY NIGHT AT BEDTIME  90 capsule  1  . Diclofenac Sodium CR 100 MG 24 hr tablet Take 1 tablet (100 mg total) by mouth daily as needed.  30 tablet  2  . fluticasone (FLONASE) 50 MCG/ACT nasal spray Place 2 sprays into the nose daily as needed.      . gabapentin (NEURONTIN) 300 MG capsule Take 300 mg by mouth daily.      Marland Kitchen lisinopril (PRINIVIL,ZESTRIL) 20 MG tablet TAKE ONE TABLET BY MOUTH ONCE DAILY  90 tablet  2  . metFORMIN (GLUCOPHAGE) 500 MG tablet TAKE ONE TABLET BY MOUTH TWICE A DAY WITH A MEAL  180 tablet  1  . Multiple Vitamin (MULTIVITAMIN) tablet Take 1 tablet by mouth daily.      Marland Kitchen omeprazole (PRILOSEC) 20 MG capsule Take 1 capsule (20 mg total) by mouth daily.  30 capsule  6  . PREMARIN 0.625 MG tablet TAKE ONE TABLET BY MOUTH EVERY DAY  30 tablet  11  . gabapentin (NEURONTIN) 300 MG capsule TAKE ONE (1) CAPSULE BY MOUTH TWICE A DAY. (EVERY 12 HOURS.)  60 capsule  3  . gabapentin (NEURONTIN) 300 MG capsule TAKE ONE (1) CAPSULE BY MOUTH TWICE A DAY. (EVERY 12 HOURS.)  60 capsule  3   No facility-administered medications prior to visit.     Allergies:  Allergies  Allergen Reactions  . Codeine Nausea And Vomiting  . Macrobid [Nitrofurantoin] Hives    History   Social History  . Marital Status: Single    Spouse Name: N/A    Number of Children: N/A  . Years of Education: N/A   Occupational History  . Not on file.   Social History Main Topics  . Smoking status: Never Smoker   . Smokeless tobacco: Never Used  . Alcohol Use: No  . Drug Use: No  . Sexual Activity: Not on file   Other Topics Concern  . Not on file   Social  History Narrative   Retired March 2014.   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 Stroke Mother   . Diabetes Brother   . Cancer Neg Hx   . Heart disease Neg Hx      Review of Systems:  See HPI for pertinent ROS. All other ROS negative.    Physical Exam: Blood pressure 126/72, pulse 96, temperature 98 F (36.7 C), temperature source Oral, resp. rate 18, weight 169 lb (76.658 kg)., Body mass index is 29.68 kg/(m^2). General: Obese WF. Appears in no acute distress. Neck: Supple. No thyromegaly. No lymphadenopathy. No carotid bruits. Lungs: Clear bilaterally to auscultation without wheezes, rales, or rhonchi. Breathing is unlabored. Heart: RRR with S1 S2. No murmurs, rubs, or gallops. Abdomen: Soft, non-tender, non-distended with normoactive bowel sounds. No hepatomegaly. No rebound/guarding. No obvious abdominal masses. Musculoskeletal:  Strength and tone normal for age. Extremities/Skin: Warm and dry. No clubbing or cyanosis. No edema. No rashes or suspicious lesions. Neuro: Alert and oriented X 3. Moves all extremities spontaneously. Gait is normal. CNII-XII grossly in tact. Psych:  Responds to questions appropriately with a normal affect. Diabetic Foot exam: Inspection is normal. Sensation is normal and intact. There is trace palpable dorsalis pedis or posterior tibial pulses bilaterally.      ASSESSMENT AND PLAN:  68 y.o. year old female with  1. Diabetes mellitus without complication  She recently came in for fasting labs. A1c is 6.5.  Last microalbumin was 11/2012. Repeat now--12/11/2013  She is on ACE inhibitor. Lisinopril 20 mg daily. She is on no statin. However last LDL is at 9 which is goal. She is on aspirin 81 mg daily.  She has no symptoms of peripheral neuropathy. At [place] 3/ 2015 she reported she had had no diabetic eye exam in over 2 years. She was agreeable for me to do referral -- office visit 12/11/13 she  reports that she did have an eye exam at [facility].    2. Hypertension Blood Pressure was elevated at last 2 office visits--therfore at [place]- 05/2013--added norvasc 5mg  QD.   Blood Pressure now at goal. CMET normal   3. Hyperlipidemia On no Statin. FLP 11/2013--LDL = 76  4. GERD (gastroesophageal reflux disease)  5. Allergy  6. Asthma  7. H/O: gout No Recent flare.  8. Hormone replacement therapy: We have discussed risk  multiple times including cardiovascular risk of blood clots risk and risk of breast cancer and uterine cancer. She is aware of risk. Has opted to continue the medication. Without the medication she has severe hot flashes and other symptoms.  9. Has history of hysterectomy. This was not performed secondary to any cancer. Therefore she does not need repeat Pap smear.  10. Mammogram: She had this performed in 2014. Aware to repeat when due this year.  11.  DEXA.: August 2011: Normal  12. Colonoscopy: July 2008. Repeat 10 years.  13. Immunizations: Pneumonia vaccine:              --Pneumovax was given here in November 2012.  -- Prevnar 13 given here 06/06/2013 Zostavax: Discussed this multiple times but she still has not followed up with this. Flu vaccine--Discussed at [place] 12/11/2013--she will return to get the influenza vaccine here in a couple weeks after her sinus infection has resolved.  Tetanus: Will not give as she has Medicare   14. Acute maxillary sinusitis, recurrence not specified - amoxicillin-clavulanate (AUGMENTIN) 875-125 MG per tablet; Take 1 tablet by mouth 2 (two) times daily.  Dispense: 20 tablet; Refill: 0 Discussed Prednisone for symptomatic relief but she defers.  Can wait 6 months for followup visit if labs are all stable. Otherwise will need followup 3 months.  Marin Olp [place], [place], [facility] 12/11/2013 8:24 AM

## 2014-01-06 ENCOUNTER — Other Ambulatory Visit: Payer: Self-pay | Admitting: Physician Assistant

## 2014-01-06 NOTE — Telephone Encounter (Signed)
Medication refilled per protocol. 

## 2014-02-03 ENCOUNTER — Other Ambulatory Visit: Payer: Self-pay | Admitting: Physician Assistant

## 2014-02-04 NOTE — Telephone Encounter (Signed)
Medication refilled per protocol. 

## 2014-03-12 ENCOUNTER — Encounter: Payer: Self-pay | Admitting: Physician Assistant

## 2014-03-12 ENCOUNTER — Ambulatory Visit (INDEPENDENT_AMBULATORY_CARE_PROVIDER_SITE_OTHER): Payer: Medicare Other | Admitting: Physician Assistant

## 2014-03-12 VITALS — BP 134/70 | HR 96 | Temp 97.7°F | Resp 18 | Wt 169.0 lb

## 2014-03-12 DIAGNOSIS — J4520 Mild intermittent asthma, uncomplicated: Secondary | ICD-10-CM

## 2014-03-12 DIAGNOSIS — Z8639 Personal history of other endocrine, nutritional and metabolic disease: Secondary | ICD-10-CM

## 2014-03-12 DIAGNOSIS — E785 Hyperlipidemia, unspecified: Secondary | ICD-10-CM

## 2014-03-12 DIAGNOSIS — E119 Type 2 diabetes mellitus without complications: Secondary | ICD-10-CM

## 2014-03-12 DIAGNOSIS — Z23 Encounter for immunization: Secondary | ICD-10-CM

## 2014-03-12 DIAGNOSIS — Z8739 Personal history of other diseases of the musculoskeletal system and connective tissue: Secondary | ICD-10-CM

## 2014-03-12 DIAGNOSIS — I10 Essential (primary) hypertension: Secondary | ICD-10-CM

## 2014-03-12 DIAGNOSIS — K219 Gastro-esophageal reflux disease without esophagitis: Secondary | ICD-10-CM

## 2014-03-12 LAB — HEMOGLOBIN A1C, FINGERSTICK: Hgb A1C (fingerstick): 6 % — ABNORMAL HIGH (ref <5.7)

## 2014-03-12 NOTE — Progress Notes (Signed)
Patient ID: Jennifer Porter MRN: [id number], DOB: 03-Sep-1945, 68 y.o. Date of Encounter: @DATE @  Chief Complaint:  Chief Complaint  Patient presents with  . 3 mth checkup    is fasting  . Flu Vaccine    HPI: 68 y.o. year old female  presents for ROV.   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  At [place] 05/2013 she said that she and her husband would be getting busier coming up soon--planting their garden. Said that they also planned to buy 6 more chickens. Said that she already had 6 chickens.  Today she says she often has been volunteering several days at the school helping out. Continues to be active outside in the yard.  As well, at office visit 11/2013 she says that she has continued to do her walking--been walking 15-20 minutes once a day either in the morning or the evening depending on the weather weather etc.  As well she has told me in the past that she is careful with her diet. Says that she has never really been one to snack or munch and tries to watch what she eats for meals.  She is taking her lisinopril as directed. No adverse effects and no lightheadedness or edema.  She reports that even with exertion she has no chest pressure heaviness tightness or increased dyspnea.   Past Medical History  Diagnosis Date  . Hypertension   . GERD (gastroesophageal reflux disease)     hiatal hernia  . Diabetes mellitus without complication   . Allergy     seasonal  . Asthma   . H/O: gout   . Hyperlipidemia      Home Meds:  Outpatient Prescriptions Prior to Visit  Medication Sig Dispense Refill  . amLODipine (NORVASC) 5 MG tablet TAKE ONE (1) TABLET EACH DAY 30 tablet 5  . aspirin 81 MG tablet Take 81 mg by mouth daily.    . calcium carbonate (OS-CAL) 600 MG TABS tablet Take 600 mg by mouth daily with breakfast.    . Choline Fenofibrate (FENOFIBRIC ACID) 135 MG CPDR TAKE ONE CAPSULE BY MOUTH AT BEDTIME 90 capsule 0  . Diclofenac Sodium  CR 100 MG 24 hr tablet Take 1 tablet (100 mg total) by mouth daily as needed. 30 tablet 2  . fluticasone (FLONASE) 50 MCG/ACT nasal spray Place 2 sprays into the nose daily as needed.    . gabapentin (NEURONTIN) 300 MG capsule Take 300 mg by mouth daily.    Marland Kitchen lisinopril (PRINIVIL,ZESTRIL) 20 MG tablet TAKE ONE TABLET BY MOUTH ONCE DAILY 90 tablet 2  . metFORMIN (GLUCOPHAGE) 500 MG tablet TAKE ONE TABLET BY MOUTH TWICE A DAY WITH A MEAL 180 tablet 1  . Multiple Vitamin (MULTIVITAMIN) tablet Take 1 tablet by mouth daily.    Marland Kitchen omeprazole (PRILOSEC) 20 MG capsule Take 1 capsule (20 mg total) by mouth daily. 30 capsule 6  . PREMARIN 0.625 MG tablet TAKE ONE (1) TABLET BY MOUTH EVERY DAY 30 tablet 5  . amoxicillin-clavulanate (AUGMENTIN) 875-125 MG per tablet Take 1 tablet by mouth 2 (two) times daily. 20 tablet 0   No facility-administered medications prior to visit.     Allergies:  Allergies  Allergen Reactions  . Codeine Nausea And Vomiting  . Macrobid [Nitrofurantoin] Hives    History   Social History  . Marital Status: Single    Spouse Name: N/A    Number of Children: N/A  . Years of  Education: N/A   Occupational History  . Not on file.   Social History Main Topics  . Smoking status: Never Smoker   . Smokeless tobacco: Never Used  . Alcohol Use: No  . Drug Use: No  . Sexual Activity: Not on file   Other Topics Concern  . Not on file   Social History Narrative   Retired March 2014.   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 Stroke Mother   . Diabetes Brother   . Cancer Neg Hx   . Heart disease Neg Hx      Review of Systems:  See HPI for pertinent ROS. All other ROS negative.    Physical Exam: Blood pressure 134/70, pulse 96, temperature 97.7 F (36.5 C), temperature source Oral, resp. rate 18, weight 169 lb (76.658 kg)., Body mass index is 29.68 kg/(m^2). General: Obese WF. Appears in no acute  distress. Neck: Supple. No thyromegaly. No lymphadenopathy. No carotid bruits. Lungs: Clear bilaterally to auscultation without wheezes, rales, or rhonchi. Breathing is unlabored. Heart: RRR with S1 S2. No murmurs, rubs, or gallops. Abdomen: Soft, non-tender, non-distended with normoactive bowel sounds. No hepatomegaly. No rebound/guarding. No obvious abdominal masses. Musculoskeletal:  Strength and tone normal for age. Extremities/Skin: Warm and dry. No clubbing or cyanosis. No edema. No rashes or suspicious lesions. Neuro: Alert and oriented X 3. Moves all extremities spontaneously. Gait is normal. CNII-XII grossly in tact. Psych:  Responds to questions appropriately with a normal affect. Diabetic Foot exam: Inspection is normal. Sensation is normal and intact. There is trace palpable dorsalis pedis or posterior tibial pulses bilaterally.      ASSESSMENT AND PLAN:  68 y.o. year old female with  1. Diabetes mellitus without complication  Check G4W now.  Last microalbumin was    12/11/2013  She is on ACE inhibitor. Lisinopril 20 mg daily. She is on no statin. However last LDL is at 76 which is goal. She is on aspirin 81 mg daily.  She has no symptoms of peripheral neuropathy. Summer 2015 -- she did have an eye exam at [place].                           Says that she was told that she had no diabetic eye disease.   2. Hypertension    Blood Pressure now at goal. CMET normal  11/2013  3. Hyperlipidemia On no Statin. FLP 11/2013--LDL = 76  4. GERD (gastroesophageal reflux disease)  5. Allergy  6. Asthma  7. H/O: gout No Recent flare.  8. Hormone replacement therapy: We have discussed risk multiple times including cardiovascular risk of blood clots risk and risk of breast cancer and uterine cancer. She is aware of risk. Has opted to continue the medication. Without the medication she has severe hot flashes and other symptoms.  9. Has history of hysterectomy. This was not  performed secondary to any cancer. Therefore she does not need repeat Pap smear.  10. Mammogram: Says that she has a mammogram scheduled for 04/03/2014  11.  DEXA.: August 2011: Normal  12. Colonoscopy: July 2008. Repeat 10 years.  13. Immunizations: Pneumonia vaccine:              --Pneumovax was given here in November 2012.  -- Prevnar 13 given here 06/06/2013 Zostavax: Discussed this multiple times but she still has not followed up with this. 02/2014---She states that  she is getting new insurance beginning in January. Says that when she gets this         new insurance coverage she will find out their coverage of the shingles vaccine then follow-up accordingly. Flu vaccine--Given here 03/12/2014 Tetanus: Will not give as she has Medicare   Can wait 6 months for followup visit if labs are all stable. Otherwise will need followup 3 months.  [address] [place], [place], [facility] 03/12/2014 8:57 AM

## 2014-03-24 ENCOUNTER — Ambulatory Visit (INDEPENDENT_AMBULATORY_CARE_PROVIDER_SITE_OTHER): Payer: Medicare Other | Admitting: Physician Assistant

## 2014-03-24 ENCOUNTER — Other Ambulatory Visit: Payer: Self-pay | Admitting: Physician Assistant

## 2014-03-24 ENCOUNTER — Encounter: Payer: Self-pay | Admitting: Physician Assistant

## 2014-03-24 VITALS — BP 126/70 | HR 88 | Temp 98.0°F | Resp 20 | Wt 169.0 lb

## 2014-03-24 DIAGNOSIS — N390 Urinary tract infection, site not specified: Secondary | ICD-10-CM | POA: Diagnosis not present

## 2014-03-24 DIAGNOSIS — R319 Hematuria, unspecified: Secondary | ICD-10-CM | POA: Diagnosis not present

## 2014-03-24 DIAGNOSIS — R309 Painful micturition, unspecified: Secondary | ICD-10-CM

## 2014-03-24 LAB — URINALYSIS, ROUTINE W REFLEX MICROSCOPIC
Bilirubin Urine: NEGATIVE
Glucose, UA: 100 mg/dL — AB
KETONES UR: NEGATIVE mg/dL
NITRITE: NEGATIVE
Protein, ur: NEGATIVE mg/dL
Specific Gravity, Urine: 1.015 (ref 1.005–1.030)
UROBILINOGEN UA: 0.2 mg/dL (ref 0.0–1.0)
pH: 5.5 (ref 5.0–8.0)

## 2014-03-24 LAB — URINALYSIS, MICROSCOPIC ONLY

## 2014-03-24 MED ORDER — LISINOPRIL 20 MG PO TABS
20.0000 mg | ORAL_TABLET | Freq: Every day | ORAL | Status: DC
Start: 2014-03-24 — End: 2014-12-15

## 2014-03-24 MED ORDER — CIPROFLOXACIN HCL 500 MG PO TABS: 500.0000 mg | ORAL_TABLET | Freq: Two times a day (BID) | ORAL | Status: DC

## 2014-03-24 NOTE — Progress Notes (Signed)
Patient ID: BRIANY AYE MRN: [id number], DOB: 02-09-1946, 69 y.o. Date of Encounter: 03/24/2014, 11:57 AM    Chief Complaint:  Chief Complaint  Patient presents with  . c/o UTI    pain, pressure, burning     HPI: 69 y.o. year old white female says that her symptoms started Saturday, January 2.  Says that since then-- the symptoms were worsening. She did start taking some cranberry which seems to have helped a little bit. Says that she is having a lot of urinary frequency. Constantly feels like she needs to go back and urinate more. Also some pressure. Burning only when she urinates. No back pain and no fevers or chills.     Home Meds:   Outpatient Prescriptions Prior to Visit  Medication Sig Dispense Refill  . amLODipine (NORVASC) 5 MG tablet TAKE ONE (1) TABLET EACH DAY 30 tablet 5  . aspirin 81 MG tablet Take 81 mg by mouth daily.    . calcium carbonate (OS-CAL) 600 MG TABS tablet Take 600 mg by mouth daily with breakfast.    . Choline Fenofibrate (FENOFIBRIC ACID) 135 MG CPDR TAKE ONE CAPSULE BY MOUTH AT BEDTIME 90 capsule 0  . Diclofenac Sodium CR 100 MG 24 hr tablet Take 1 tablet (100 mg total) by mouth daily as needed. 30 tablet 2  . fluticasone (FLONASE) 50 MCG/ACT nasal spray Place 2 sprays into the nose daily as needed.    . gabapentin (NEURONTIN) 300 MG capsule Take 300 mg by mouth daily.    . metFORMIN (GLUCOPHAGE) 500 MG tablet TAKE ONE TABLET BY MOUTH TWICE A DAY WITH A MEAL 180 tablet 1  . Multiple Vitamin (MULTIVITAMIN) tablet Take 1 tablet by mouth daily.    Marland Kitchen omeprazole (PRILOSEC) 20 MG capsule Take 1 capsule (20 mg total) by mouth daily. 30 capsule 6  . PREMARIN 0.625 MG tablet TAKE ONE (1) TABLET BY MOUTH EVERY DAY 30 tablet 5  . lisinopril (PRINIVIL,ZESTRIL) 20 MG tablet TAKE ONE TABLET BY MOUTH ONCE DAILY 90 tablet 2   No facility-administered medications prior to visit.    Allergies:  Allergies  Allergen Reactions  . Codeine Nausea And Vomiting  .  Macrobid [Nitrofurantoin] Hives      Review of Systems: See HPI for pertinent ROS. All other ROS negative.    Physical Exam: Blood pressure 126/70, pulse 88, temperature 98 F (36.7 C), temperature source Oral, resp. rate 20, weight 169 lb (76.658 kg)., Body mass index is 29.68 kg/(m^2). General:  WF. Appears in no acute distress. Lungs: Clear bilaterally to auscultation without wheezes, rales, or rhonchi. Breathing is unlabored. Heart: Regular rhythm. No murmurs, rubs, or gallops. Abdomen: Soft, non-distended with normoactive bowel sounds. No hepatomegaly. No rebound/guarding. No obvious abdominal masses. Minimal tenderness with palpation of suprapubic area.  Msk:  Strength and tone normal for age. No tenderness with percussion of costophrenic angles bilaterally. Extremities/Skin: Warm and dry. Neuro: Alert and oriented X 3. Moves all extremities spontaneously. Gait is normal. CNII-XII grossly in tact. Psych:  Responds to questions appropriately with a normal affect.   Results for orders placed or performed in visit on 03/24/14  Urinalysis, Routine w reflex microscopic  Result Value Ref Range   Color, Urine YELLOW YELLOW   APPearance CLEAR CLEAR   Specific Gravity, Urine 1.015 1.005 - 1.030   pH 5.5 5.0 - 8.0   Glucose, UA 100 (A) NEG mg/dL   Bilirubin Urine NEG NEG   Ketones, ur NEG NEG mg/dL  Hgb urine dipstick MOD (A) NEG   Protein, ur NEG NEG mg/dL   Urobilinogen, UA 0.2 0.0 - 1.0 mg/dL   Nitrite NEG NEG   Leukocytes, UA TRACE (A) NEG  Urine Microscopic  Result Value Ref Range   Squamous Epithelial / LPF MANY (A) RARE   Crystals Uric Acid crystals noted NONE SEEN   Casts SEE NOTE (A) NONE SEEN   WBC, UA 7-10 (A) <3 WBC/hpf   RBC / HPF 3-6 (A) <3 RBC/hpf   Bacteria, UA FEW (A) RARE     ASSESSMENT AND PLAN:  69 y.o. year old female with  1. Urinary tract infection with hematuria, site unspecified - ciprofloxacin (CIPRO) 500 MG tablet; Take 1 tablet (500 mg total)  by mouth 2 (two) times daily.  Dispense: 14 tablet; Refill: 0  2. Painful urination - Urinalysis, Routine w reflex microscopic  Take antibiotic as directed and complete all of it. Follow-up if symptoms worsen or do not resolve after completion of antibiotic.  Signed, [address] [place], [place], [facility] 03/24/2014 11:57 AM

## 2014-03-24 NOTE — Telephone Encounter (Signed)
Medication refilled per protocol. 

## 2014-04-03 DIAGNOSIS — Z1231 Encounter for screening mammogram for malignant neoplasm of breast: Secondary | ICD-10-CM | POA: Diagnosis not present

## 2014-04-30 ENCOUNTER — Encounter: Payer: Self-pay | Admitting: Family Medicine

## 2014-05-05 ENCOUNTER — Other Ambulatory Visit: Payer: Self-pay | Admitting: Physician Assistant

## 2014-05-06 NOTE — Telephone Encounter (Signed)
Medication refilled per protocol. 

## 2014-05-13 ENCOUNTER — Other Ambulatory Visit: Payer: Self-pay | Admitting: Physician Assistant

## 2014-05-13 NOTE — Telephone Encounter (Signed)
Medication refilled per protocol. 

## 2014-06-02 ENCOUNTER — Other Ambulatory Visit: Payer: Self-pay | Admitting: Physician Assistant

## 2014-06-02 NOTE — Telephone Encounter (Signed)
Medication refilled per protocol. 

## 2014-06-23 ENCOUNTER — Ambulatory Visit: Payer: BC Managed Care – PPO | Admitting: Physician Assistant

## 2014-06-23 ENCOUNTER — Encounter: Payer: Self-pay | Admitting: Physician Assistant

## 2014-07-02 ENCOUNTER — Ambulatory Visit (INDEPENDENT_AMBULATORY_CARE_PROVIDER_SITE_OTHER): Payer: BC Managed Care – PPO | Admitting: Physician Assistant

## 2014-07-02 ENCOUNTER — Encounter: Payer: Self-pay | Admitting: Physician Assistant

## 2014-07-02 ENCOUNTER — Telehealth: Payer: Self-pay | Admitting: Physician Assistant

## 2014-07-02 VITALS — BP 122/76 | HR 84 | Temp 98.1°F | Resp 18 | Ht 63.0 in | Wt 169.0 lb

## 2014-07-02 DIAGNOSIS — M25562 Pain in left knee: Secondary | ICD-10-CM | POA: Diagnosis not present

## 2014-07-02 DIAGNOSIS — Z8639 Personal history of other endocrine, nutritional and metabolic disease: Secondary | ICD-10-CM

## 2014-07-02 DIAGNOSIS — I10 Essential (primary) hypertension: Secondary | ICD-10-CM | POA: Diagnosis not present

## 2014-07-02 DIAGNOSIS — K219 Gastro-esophageal reflux disease without esophagitis: Secondary | ICD-10-CM

## 2014-07-02 DIAGNOSIS — Z8739 Personal history of other diseases of the musculoskeletal system and connective tissue: Secondary | ICD-10-CM

## 2014-07-02 DIAGNOSIS — Z23 Encounter for immunization: Secondary | ICD-10-CM | POA: Diagnosis not present

## 2014-07-02 DIAGNOSIS — E119 Type 2 diabetes mellitus without complications: Secondary | ICD-10-CM

## 2014-07-02 DIAGNOSIS — J4520 Mild intermittent asthma, uncomplicated: Secondary | ICD-10-CM | POA: Diagnosis not present

## 2014-07-02 DIAGNOSIS — E785 Hyperlipidemia, unspecified: Secondary | ICD-10-CM

## 2014-07-02 LAB — HEMOGLOBIN A1C
Hgb A1c MFr Bld: 6.5 % — ABNORMAL HIGH (ref <5.7)
MEAN PLASMA GLUCOSE: 140 mg/dL — AB (ref <117)

## 2014-07-02 LAB — COMPLETE METABOLIC PANEL WITH GFR
ALBUMIN: 4.3 g/dL (ref 3.5–5.2)
ALT: 17 U/L (ref 0–35)
AST: 19 U/L (ref 0–37)
Alkaline Phosphatase: 16 U/L — ABNORMAL LOW (ref 39–117)
BUN: 18 mg/dL (ref 6–23)
CO2: 22 meq/L (ref 19–32)
CREATININE: 0.74 mg/dL (ref 0.50–1.10)
Calcium: 9.3 mg/dL (ref 8.4–10.5)
Chloride: 102 mEq/L (ref 96–112)
GFR, EST NON AFRICAN AMERICAN: 83 mL/min
GLUCOSE: 123 mg/dL — AB (ref 70–99)
Potassium: 4.2 mEq/L (ref 3.5–5.3)
Sodium: 141 mEq/L (ref 135–145)
TOTAL PROTEIN: 6.8 g/dL (ref 6.0–8.3)
Total Bilirubin: 0.5 mg/dL (ref 0.2–1.2)

## 2014-07-02 NOTE — Telephone Encounter (Signed)
Patient is letting us know that the shingles shot she received today needs to be filed with [facility] first, and everything else would need to be filed with [facility] first

## 2014-07-02 NOTE — Progress Notes (Signed)
Patient ID: Jennifer Porter MRN: [id number], DOB: 12/26/1945, 69 y.o. Date of Encounter: @DATE @  Chief Complaint:  Chief Complaint  Patient presents with  . 3 mth check up    is fasting    HPI: 69 y.o. year old female  presents for [place].   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  At [place] 05/2013 she said that she and her husband would be getting busier coming up soon--planting their garden. Said that they also planned to buy 6 more chickens. Said that she already had 6 chickens.  Today she says she often has been volunteering several days at the school helping out. Continues to be active outside in the yard.  As well, at office visit 11/2013 she says that she has continued to do her walking--been walking 15-20 minutes once a day either in the morning or the evening depending on the weather weather etc.  As well she has told me in the past that she is careful with her diet. Says that she has never really been one to snack or munch and tries to watch what she eats for meals.  She is taking her lisinopril as directed. No adverse effects and no lightheadedness or edema.  She reports that even with exertion she has no chest pressure heaviness tightness or increased dyspnea.  At [place] 06/2014--reports that she has been having a lot of problem with her left knee. Says that was one of the reasons she went ahead and retired was that that knee was really bothering her. However says it is bothering her now and affecting her activity. Says that when she does her walking she feels like it is bone on bone. Says yesterday she was at the grocery store and it was giving way--says that she was holding to the grocery cart but still it was giving way and causing problems. Says that she had arthroscopic surgery to that knee > 10 years ago.    Past Medical History  Diagnosis Date  . Hypertension   . GERD (gastroesophageal reflux disease)     hiatal hernia  . Diabetes  mellitus without complication   . Allergy     seasonal  . Asthma   . H/O: gout   . Hyperlipidemia      Home Meds:  Outpatient Prescriptions Prior to Visit  Medication Sig Dispense Refill  . amLODipine (NORVASC) 5 MG tablet TAKE ONE (1) TABLET EACH DAY 30 tablet 5  . aspirin 81 MG tablet Take 81 mg by mouth daily.    . calcium carbonate (OS-CAL) 600 MG TABS tablet Take 600 mg by mouth daily with breakfast.    . Choline Fenofibrate (FENOFIBRIC ACID) 135 MG CPDR TAKE ONE CAPSULE BY MOUTH AT BEDTIME 90 capsule 0  . Diclofenac Sodium CR 100 MG 24 hr tablet Take 1 tablet (100 mg total) by mouth daily as needed. 30 tablet 2  . fluticasone (FLONASE) 50 MCG/ACT nasal spray Place 2 sprays into the nose daily as needed.    . gabapentin (NEURONTIN) 300 MG capsule TAKE ONE (1) CAPSULE BY MOUTH TWICE A DAY. (EVERY 12 HOURS.) 60 capsule 5  . lisinopril (PRINIVIL,ZESTRIL) 20 MG tablet Take 1 tablet (20 mg total) by mouth daily. 90 tablet 2  . metFORMIN (GLUCOPHAGE) 500 MG tablet TAKE ONE TABLET BY MOUTH TWICE A DAY WITH A MEAL 180 tablet 0  . Multiple Vitamin (MULTIVITAMIN) tablet Take 1 tablet by mouth daily.    Marland Kitchen  omeprazole (PRILOSEC) 20 MG capsule Take 1 capsule (20 mg total) by mouth daily. 30 capsule 6  . PREMARIN 0.625 MG tablet TAKE ONE (1) TABLET BY MOUTH EVERY DAY 30 tablet 5  . ciprofloxacin (CIPRO) 500 MG tablet Take 1 tablet (500 mg total) by mouth 2 (two) times daily. 14 tablet 0   No facility-administered medications prior to visit.     Allergies:  Allergies  Allergen Reactions  . Codeine Nausea And Vomiting  . Macrobid [Nitrofurantoin] Hives    History   Social History  . Marital Status: Single    Spouse Name: N/A  . Number of Children: N/A  . Years of Education: N/A   Occupational History  . Not on file.   Social History Main Topics  . Smoking status: Never Smoker   . Smokeless tobacco: Never Used  . Alcohol Use: No  . Drug Use: No  . Sexual Activity: Not on file     Other Topics Concern  . Not on file   Social History Narrative   Retired March 2014.   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 Stroke Mother   . Diabetes Brother   . Cancer Neg Hx   . Heart disease Neg Hx      Review of Systems:  See HPI for pertinent ROS. All other ROS negative.    Physical Exam: Blood pressure 122/76, pulse 84, temperature 98.1 F (36.7 C), temperature source Oral, resp. rate 18, height 5\' 3"  (1.6 m), weight 169 lb (76.658 kg)., Body mass index is 29.94 kg/(m^2). General: Obese WF. Appears in no acute distress. Neck: Supple. No thyromegaly. No lymphadenopathy. No carotid bruits. Lungs: Clear bilaterally to auscultation without wheezes, rales, or rhonchi. Breathing is unlabored. Heart: RRR with S1 S2. No murmurs, rubs, or gallops. Abdomen: Soft, non-tender, non-distended with normoactive bowel sounds. No hepatomegaly. No rebound/guarding. No obvious abdominal masses. Musculoskeletal:  Strength and tone normal for age. Extremities/Skin: Warm and dry. No clubbing or cyanosis. No edema. No rashes or suspicious lesions. Neuro: Alert and oriented X 3. Moves all extremities spontaneously. Gait is normal. CNII-XII grossly in tact. Psych:  Responds to questions appropriately with a normal affect. Diabetic Foot exam: Inspection is normal. Sensation is normal and intact.  1+ - 2+ DP pulses bilaterally. No palpable PT  pulses bilaterally.      ASSESSMENT AND PLAN:  69 y.o. year old female with  1. Diabetes mellitus without complication  Check P7T now.  Last microalbumin was    12/11/2013  She is on ACE inhibitor. Lisinopril 20 mg daily. She is on no statin. However last 2  LDLs have been 68 and 76-- which is goal. She is on aspirin 81 mg daily.  She has no symptoms of peripheral neuropathy. Summer 2015 -- she did have an eye exam at [facility].                           Says that she was told that she had  no diabetic eye disease. At visit 06/2014 she states that she just received a letter stating that she was due for her eye exam. She has not yet called to schedule it, but will do so.   2. Hypertension    Blood Pressure now at goal. CMET normal  11/2013 On ACE Inh.  3. Hyperlipidemia On no Statin. FLP 11/2013--LDL = 76    4.  Left knee pain - Ambulatory referral to Orthopedic Surgery   4. GERD (gastroesophageal reflux disease)  5. Allergy  6. Asthma  7. H/O: gout No Recent flare.   8. Hormone replacement therapy: We have discussed risk multiple times including cardiovascular risk of blood clots risk and risk of breast cancer and uterine cancer. She is aware of risk. Has opted to continue the medication. Without the medication she has severe hot flashes and other symptoms.  9. Has history of hysterectomy. This was not performed secondary to any cancer. Therefore she does not need repeat Pap smear.  10. Mammogram: Says that she had  mammogram  04/03/2014--negative  11.  DEXA.: August 2011: Normal   At visit 06/2014 discussed the fact that it is been 5 years since last bone density test and things could have changed. However, she defers rechecking at this time. She is on HRT which decreases her risk for              developing osteoporosis. Will rediscuss in future.  --- Last vitamin D level was checked 11/28/2012. Level was normal at 45.  12. Colonoscopy: July 2008. Repeat 10 years.  13. Immunizations: Pneumonia vaccine:              --Pneumovax was given here in November 2012.  -- Prevnar 13 given here 06/06/2013 Zostavax:  07/02/2014--Insurance does pay for Zostavax-----Zostavax given 07/02/2014 Flu vaccine--Given here 03/12/2014 Tetanus: Will not give as she has Medicare   Can wait 6 months for followup visit if labs are all stable. Otherwise will need followup 3 months.  Marin Olp [place], [place], [facility] 07/02/2014 8:27 AM

## 2014-07-04 ENCOUNTER — Other Ambulatory Visit: Payer: Self-pay | Admitting: Physician Assistant

## 2014-07-04 NOTE — Telephone Encounter (Signed)
Medication refilled per protocol. 

## 2014-07-14 ENCOUNTER — Encounter: Payer: Self-pay | Admitting: Physician Assistant

## 2014-07-14 ENCOUNTER — Ambulatory Visit (INDEPENDENT_AMBULATORY_CARE_PROVIDER_SITE_OTHER): Payer: Medicare Other | Admitting: Physician Assistant

## 2014-07-14 VITALS — BP 142/80 | HR 96 | Temp 98.3°F | Resp 18 | Wt 167.0 lb

## 2014-07-14 DIAGNOSIS — J0140 Acute pansinusitis, unspecified: Secondary | ICD-10-CM

## 2014-07-14 MED ORDER — AMOXICILLIN-POT CLAVULANATE 875-125 MG PO TABS: 1.0000 | ORAL_TABLET | Freq: Two times a day (BID) | ORAL | Status: DC

## 2014-07-14 NOTE — Progress Notes (Signed)
Patient ID: Jennifer Porter MRN: [id number], DOB: 1945-04-04, 69 y.o. Date of Encounter: 07/14/2014, 12:42 PM    Chief Complaint:  Chief Complaint  Patient presents with  . sick x 5 days    sinus infection     HPI: 69 y.o. year old white female presents with above complaints. Points to forehead, area around nasal bridge, and bilateral cheeks as areas of a lot of pressure and pain. Blowing some mucus from nose. Has a cough but thinks it is secondary to drainage down throat, rather than congestion in chest. No ST, Ear Ache, Fever, Chills.     Home Meds:   Outpatient Prescriptions Prior to Visit  Medication Sig Dispense Refill  . amLODipine (NORVASC) 5 MG tablet TAKE ONE (1) TABLET EACH DAY 30 tablet 5  . aspirin 81 MG tablet Take 81 mg by mouth daily.    . calcium carbonate (OS-CAL) 600 MG TABS tablet Take 600 mg by mouth daily with breakfast.    . Choline Fenofibrate (FENOFIBRIC ACID) 135 MG CPDR TAKE ONE CAPSULE BY MOUTH AT BEDTIME 90 capsule 0  . Diclofenac Sodium CR 100 MG 24 hr tablet Take 1 tablet (100 mg total) by mouth daily as needed. 30 tablet 2  . fluticasone (FLONASE) 50 MCG/ACT nasal spray Place 2 sprays into the nose daily as needed.    . gabapentin (NEURONTIN) 300 MG capsule TAKE ONE (1) CAPSULE BY MOUTH TWICE A DAY. (EVERY 12 HOURS.) 60 capsule 5  . lisinopril (PRINIVIL,ZESTRIL) 20 MG tablet Take 1 tablet (20 mg total) by mouth daily. 90 tablet 2  . metFORMIN (GLUCOPHAGE) 500 MG tablet TAKE ONE TABLET BY MOUTH TWICE A DAY WITH A MEAL 180 tablet 0  . Multiple Vitamin (MULTIVITAMIN) tablet Take 1 tablet by mouth daily.    Marland Kitchen omeprazole (PRILOSEC) 20 MG capsule Take 1 capsule (20 mg total) by mouth daily. 30 capsule 6  . PREMARIN 0.625 MG tablet TAKE ONE (1) TABLET BY MOUTH EVERY DAY 30 tablet 11   No facility-administered medications prior to visit.    Allergies:  Allergies  Allergen Reactions  . Codeine Nausea And Vomiting  . Macrobid [Nitrofurantoin] Hives       Review of Systems: See HPI for pertinent ROS. All other ROS negative.    Physical Exam: Blood pressure 142/80, pulse 96, temperature 98.3 F (36.8 C), temperature source Oral, resp. rate 18, weight 167 lb (75.751 kg)., Body mass index is 29.59 kg/(m^2). General: Overweight WF.  Appears in no acute distress. HEENT: Normocephalic, atraumatic, eyes without discharge, sclera non-icteric, nares are without discharge. Bilateral auditory canals clear, TM's are without perforation, pearly grey and translucent with reflective cone of light bilaterally. Oral cavity moist, posterior pharynx without exudate, erythema, peritonsillar abscess.  Very tender with percussion of bilateral frontal and maxillary sinuses.   Neck: Supple. No thyromegaly. No lymphadenopathy. Lungs: Clear bilaterally to auscultation without wheezes, rales, or rhonchi. Breathing is unlabored. Heart: Regular rhythm. No murmurs, rubs, or gallops. Msk:  Strength and tone normal for age. Extremities/Skin: Warm and dry.  Neuro: Alert and oriented X 3. Moves all extremities spontaneously. Gait is normal. CNII-XII grossly in tact. Psych:  Responds to questions appropriately with a normal affect.     ASSESSMENT AND PLAN:  69 y.o. year old female with  1. Acute pansinusitis, recurrence not specified I recommended prednisone for symptom relief but she defers.  She is to use her Flonase. OTC Anti-Inflammatories.I gave her samples of Norel AD decongestant. Complete abx.  F/U if symptoms do not resolve with completion of abx.  - amoxicillin-clavulanate (AUGMENTIN) 875-125 MG per tablet; Take 1 tablet by mouth 2 (two) times daily.  Dispense: 28 tablet; Refill: 0   Signed, [address] [place], [place], [facility] 07/14/2014 12:42 PM

## 2014-07-21 DIAGNOSIS — M1712 Unilateral primary osteoarthritis, left knee: Secondary | ICD-10-CM | POA: Diagnosis not present

## 2014-08-15 ENCOUNTER — Other Ambulatory Visit: Payer: Self-pay | Admitting: Physician Assistant

## 2014-08-15 NOTE — Telephone Encounter (Signed)
Medication refilled per protocol. 

## 2014-09-01 ENCOUNTER — Other Ambulatory Visit: Payer: Self-pay | Admitting: Physician Assistant

## 2014-09-01 NOTE — Telephone Encounter (Signed)
Medication refill sent to pharmacy  

## 2014-10-02 ENCOUNTER — Ambulatory Visit: Payer: BC Managed Care – PPO | Admitting: Physician Assistant

## 2014-10-02 ENCOUNTER — Encounter: Payer: BC Managed Care – PPO | Admitting: Physician Assistant

## 2014-10-02 ENCOUNTER — Ambulatory Visit (INDEPENDENT_AMBULATORY_CARE_PROVIDER_SITE_OTHER): Payer: Medicare Other | Admitting: Physician Assistant

## 2014-10-02 ENCOUNTER — Encounter: Payer: Self-pay | Admitting: Physician Assistant

## 2014-10-02 VITALS — BP 138/72 | HR 78 | Temp 97.7°F | Resp 14 | Ht 63.0 in | Wt 169.0 lb

## 2014-10-02 DIAGNOSIS — M25562 Pain in left knee: Secondary | ICD-10-CM

## 2014-10-02 DIAGNOSIS — Z0000 Encounter for general adult medical examination without abnormal findings: Secondary | ICD-10-CM | POA: Diagnosis not present

## 2014-10-02 DIAGNOSIS — E785 Hyperlipidemia, unspecified: Secondary | ICD-10-CM

## 2014-10-02 DIAGNOSIS — J4520 Mild intermittent asthma, uncomplicated: Secondary | ICD-10-CM

## 2014-10-02 DIAGNOSIS — Z8639 Personal history of other endocrine, nutritional and metabolic disease: Secondary | ICD-10-CM | POA: Diagnosis not present

## 2014-10-02 DIAGNOSIS — E559 Vitamin D deficiency, unspecified: Secondary | ICD-10-CM | POA: Diagnosis not present

## 2014-10-02 DIAGNOSIS — K219 Gastro-esophageal reflux disease without esophagitis: Secondary | ICD-10-CM

## 2014-10-02 DIAGNOSIS — Z8739 Personal history of other diseases of the musculoskeletal system and connective tissue: Secondary | ICD-10-CM

## 2014-10-02 DIAGNOSIS — E119 Type 2 diabetes mellitus without complications: Secondary | ICD-10-CM

## 2014-10-02 DIAGNOSIS — I10 Essential (primary) hypertension: Secondary | ICD-10-CM

## 2014-10-02 LAB — CBC WITH DIFFERENTIAL/PLATELET
BASOS PCT: 1 % (ref 0–1)
Basophils Absolute: 0.1 10*3/uL (ref 0.0–0.1)
Eosinophils Absolute: 0.3 10*3/uL (ref 0.0–0.7)
Eosinophils Relative: 4 % (ref 0–5)
HCT: 40 % (ref 36.0–46.0)
Hemoglobin: 13.2 g/dL (ref 12.0–15.0)
LYMPHS PCT: 29 % (ref 12–46)
Lymphs Abs: 1.9 10*3/uL (ref 0.7–4.0)
MCH: 30.6 pg (ref 26.0–34.0)
MCHC: 33 g/dL (ref 30.0–36.0)
MCV: 92.6 fL (ref 78.0–100.0)
MONOS PCT: 7 % (ref 3–12)
MPV: 10.5 fL (ref 8.6–12.4)
Monocytes Absolute: 0.5 10*3/uL (ref 0.1–1.0)
NEUTROS PCT: 59 % (ref 43–77)
Neutro Abs: 4 10*3/uL (ref 1.7–7.7)
Platelets: 309 10*3/uL (ref 150–400)
RBC: 4.32 MIL/uL (ref 3.87–5.11)
RDW: 13.3 % (ref 11.5–15.5)
WBC: 6.7 10*3/uL (ref 4.0–10.5)

## 2014-10-02 LAB — COMPLETE METABOLIC PANEL WITH GFR
ALBUMIN: 4 g/dL (ref 3.5–5.2)
ALK PHOS: 17 U/L — AB (ref 39–117)
ALT: 12 U/L (ref 0–35)
AST: 15 U/L (ref 0–37)
BUN: 14 mg/dL (ref 6–23)
CALCIUM: 9.4 mg/dL (ref 8.4–10.5)
CHLORIDE: 102 meq/L (ref 96–112)
CO2: 24 mEq/L (ref 19–32)
CREATININE: 0.69 mg/dL (ref 0.50–1.10)
GFR, EST NON AFRICAN AMERICAN: 89 mL/min
GFR, Est African American: >89 mL/min
Glucose, Bld: 129 mg/dL — ABNORMAL HIGH (ref 70–99)
POTASSIUM: 4.1 meq/L (ref 3.5–5.3)
Sodium: 141 mEq/L (ref 135–145)
TOTAL PROTEIN: 6.7 g/dL (ref 6.0–8.3)
Total Bilirubin: 0.5 mg/dL (ref 0.2–1.2)

## 2014-10-02 LAB — LIPID PANEL
CHOL/HDL RATIO: 3.7 ratio
CHOLESTEROL: 169 mg/dL (ref 0–200)
HDL: 46 mg/dL (ref >=46)
LDL Cholesterol: 75 mg/dL (ref 0–99)
Triglycerides: 238 mg/dL — ABNORMAL HIGH (ref <150)
VLDL: 48 mg/dL — ABNORMAL HIGH (ref 0–40)

## 2014-10-02 LAB — TSH: TSH: 2.904 u[IU]/mL (ref 0.350–4.500)

## 2014-10-02 LAB — HEMOGLOBIN A1C
Hgb A1c MFr Bld: 6.5 % — ABNORMAL HIGH (ref <5.7)
Mean Plasma Glucose: 140 mg/dL — ABNORMAL HIGH (ref <117)

## 2014-10-02 NOTE — Progress Notes (Signed)
Patient ID: Jennifer Porter MRN: [id number], DOB: 07-Jun-1945, 69 y.o. Date of Encounter: @DATE @  Chief Complaint:  Chief Complaint  Patient presents with  . [employer] CPE    is fasting    HPI: 69 y.o. year old female  presents for ROV.   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  At [place] 05/2013 she said that she and her husband would be getting busier coming up soon--planting their garden. Said that they also planned to buy 6 more chickens. Said that she already had 6 chickens.  At f/u OV she said she often has been volunteering several days at the school helping out. Continues to be active outside in the yard.  As well, at office visit 11/2013 she says that she has continued to do her walking--been walking 15-20 minutes once a day either in the morning or the evening depending on the weather weather etc.  As well she has told me in the past that she is careful with her diet. Says that she has never really been one to snack or munch and tries to watch what she eats for meals.  She is taking her lisinopril as directed. No adverse effects and no lightheadedness or edema.  She reports that even with exertion she has no chest pressure heaviness tightness or increased dyspnea.  At [place] 06/2014--reports that she has been having a lot of problem with her left knee. Says that was one of the reasons she went ahead and retired was that that knee was really bothering her. However says it is bothering her now and affecting her activity. Says that when she does her walking she feels like it is bone on bone. Says yesterday she was at the grocery store and it was giving way--says that she was holding to the grocery cart but still it was giving way and causing problems. Says that she had arthroscopic surgery to that knee > 10 years ago.  At that OV I referred her to Ortho.  At [place] 10/02/2014:  Reports she saw Dr. Lyla Glassing at [facility].  Says she has "no cartilage.  Bone on bone." He treated with an injection. Told her some exercises to do to strengthen the muscles above the knee. Gave her some wedges for her shoes. Says that the alternative would be a total knee replacement. She says that she really does not want to have that done unless she absolutely has to. Says that the shot did help but seems to be wearing off. Not want to keep getting shots and left she was she absolutely has to either.  At this visit also states that her older sister recently died with colon cancer. Patient and her younger sister cared for her those last few weeks. No other complaints or concerns. Otherwise is been doing well. She has a garden with tomatoes squash cucumbers okra green beans etc. Also her chickens.   Past Medical History  Diagnosis Date  . Hypertension   . GERD (gastroesophageal reflux disease)     hiatal hernia  . Diabetes mellitus without complication   . Allergy     seasonal  . Asthma   . H/O: gout   . Hyperlipidemia      Home Meds:  Outpatient Prescriptions Prior to Visit  Medication Sig Dispense Refill  . aspirin 81 MG tablet Take 81 mg by mouth daily.    . calcium carbonate (OS-CAL) 600 MG TABS tablet Take 600 mg by mouth  daily with breakfast.    . Choline Fenofibrate (FENOFIBRIC ACID) 135 MG CPDR TAKE ONE CAPSULE BY MOUTH EVERY NIGHT AT BEDTIME 90 capsule 0  . Diclofenac Sodium CR 100 MG 24 hr tablet Take 1 tablet (100 mg total) by mouth daily as needed. 30 tablet 2  . fluticasone (FLONASE) 50 MCG/ACT nasal spray Place 2 sprays into the nose daily as needed.    . gabapentin (NEURONTIN) 300 MG capsule TAKE ONE (1) CAPSULE BY MOUTH TWICE A DAY. (EVERY 12 HOURS.) 60 capsule 5  . lisinopril (PRINIVIL,ZESTRIL) 20 MG tablet Take 1 tablet (20 mg total) by mouth daily. 90 tablet 2  . metFORMIN (GLUCOPHAGE) 500 MG tablet TAKE ONE TABLET BY MOUTH TWICE A DAY WITH A MEAL 180 tablet 0  . Multiple Vitamin (MULTIVITAMIN) tablet Take 1 tablet by mouth daily.    Marland Kitchen  omeprazole (PRILOSEC) 20 MG capsule Take 1 capsule (20 mg total) by mouth daily. 30 capsule 6  . PREMARIN 0.625 MG tablet TAKE ONE (1) TABLET BY MOUTH EVERY DAY 30 tablet 11  . amLODipine (NORVASC) 5 MG tablet TAKE ONE (1) TABLET EACH DAY (Patient not taking: Reported on 10/02/2014) 30 tablet 5  . amoxicillin-clavulanate (AUGMENTIN) 875-125 MG per tablet Take 1 tablet by mouth 2 (two) times daily. (Patient not taking: Reported on 10/02/2014) 28 tablet 0   No facility-administered medications prior to visit.     Allergies:  Allergies  Allergen Reactions  . Codeine Nausea And Vomiting  . Macrobid [Nitrofurantoin] Hives    History   Social History  . Marital Status: Single    Spouse Name: N/A  . Number of Children: N/A  . Years of Education: N/A   Occupational History  . Not on file.   Social History Main Topics  . Smoking status: Never Smoker   . Smokeless tobacco: Never Used  . Alcohol Use: No  . Drug Use: No  . Sexual Activity: Not on file   Other Topics Concern  . Not on file   Social History Narrative   Retired March 2014.   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 Stroke Mother   . Diabetes Brother   . Cancer Neg Hx   . Heart disease Neg Hx      Review of Systems:  See HPI for pertinent ROS. All other ROS negative.    Physical Exam: Blood pressure 138/72, pulse 78, temperature 97.7 F (36.5 C), temperature source Oral, resp. rate 14, height 5\' 3"  (1.6 m), weight 169 lb (76.658 kg)., Body mass index is 29.94 kg/(m^2). General: Obese WF. Appears in no acute distress. Neck: Supple. No thyromegaly. No lymphadenopathy. No carotid bruits. Lungs: Clear bilaterally to auscultation without wheezes, rales, or rhonchi. Breathing is unlabored. Heart: RRR with S1 S2. No murmurs, rubs, or gallops. Breast Exam:  Large breasts. Soft. No masses. No skin changes. No nipple discharge.  Abdomen: Soft, non-tender,  non-distended with normoactive bowel sounds. No hepatomegaly. No rebound/guarding. No obvious abdominal masses. Musculoskeletal:  Strength and tone normal for age. Pelvic Exam: Defers. Says she has no uterus no ovaries says there is nothing there to need to examine. Does not want to go through that. Extremities/Skin: Warm and dry. No clubbing or cyanosis. No edema. No rashes or suspicious lesions. Neuro: Alert and oriented X 3. Moves all extremities spontaneously. Gait is normal. CNII-XII grossly in tact. Psych:  Responds to questions appropriately with a normal affect. Diabetic  Foot exam: Inspection is normal. Sensation is normal and intact.  1+ - 2+ DP pulses bilaterally. No palpable PT  pulses bilaterally.      ASSESSMENT AND PLAN:  69 y.o. year old female with  1. Diabetes mellitus without complication  Check J5K now.  Last microalbumin was    12/11/2013  She is on ACE inhibitor. Lisinopril 20 mg daily. She is on no statin. However last 2  LDLs have been 68 and 76-- which is goal. She is on aspirin 81 mg daily.  She has no symptoms of peripheral neuropathy. Summer 2015 -- she did have an eye exam at [facility].                           Says that she was told that she had no diabetic eye disease. At visit 06/2014 she states that she just received a letter stating that she was due for her eye exam. She has not yet called to schedule it, but will do so.   2. Hypertension   Blood Pressure now at goal. CMET normal  11/2013 On ACE Inh.  3. Hyperlipidemia On no Statin. FLP 11/2013--LDL = 76  4. Left knee pain After office visit 06/2014 she saw Dr. Lyla Glassing at [facility]. Patient states that she was told she has "no cartilage and bone on bone" in that left knee. Says that she was treated with injection,  Exercises, wedges in in her shoes. Was told that the alternative would be a total knee replacement. She does not want to undergo this surgery unless absolutely necessary. Also  does not want to have to do repeat injections unless absolutely necessary.  5. GERD (gastroesophageal reflux disease) Symptoms controlled with current medication.  6 Allergy Stable.   7. Asthma No recent flares.   8. H/O: gout No Recent flare.  9. Hormone replacement therapy: We have discussed risk multiple times including cardiovascular risk of blood clots risk and risk of breast cancer and uterine cancer. She is aware of risk. Has opted to continue the medication. Without the medication she has severe hot flashes and other symptoms.  10. Has history of hysterectomy. This was not performed secondary to any cancer. Therefore she does not need repeat Pap smear. At visit 09/2014 she refuses/defers pelvic exam. Says that she has no uterus and has no ovaries and a half nothing there to examine and does not want to go through that.  11. Mammogram: Says that she had  mammogram  04/03/2014--negative  12.  DEXA.: August 2011: Normal   At visit 06/2014 discussed the fact that it is been 5 years since last bone density test and things could have changed. However, she defers rechecking at this time. She is on HRT   which   decreases her risk for developing osteoporosis. Will rediscuss in future. Rediscussed at [place] 09/2014--still defers.  --- Last vitamin D level was checked 11/28/2012. Level was normal at 45.  13. Colonoscopy: July 2008. Repeat 10 years.  14. Immunizations: Pneumonia vaccine:              --Pneumovax was given here in November 2012.  -- Prevnar 13 given here 06/06/2013 Zostavax:  07/02/2014--Insurance does pay for Zostavax-----Zostavax given 07/02/2014 Flu vaccine--Given here 03/12/2014 Tetanus: Will not give as she has Medicare   Can wait 6 months for followup visit if labs are all stable. Otherwise will need followup 3 months.    Subjective:   Patient presents  for Medicare Annual/Subsequent preventive examination.   Review Past Medical/Family/Social: These are all reviewed  updated and documented today.   Risk Factors  Current exercise habits: This is documented in history of present illness above. Dietary issues discussed: This is documented in history of present illness above. She eats a low carbohydrate low sodium low fat/low cholesterol diet.  Cardiac risk factors: Obesity, DM, HTN, HLD   Depression Screen  (Note: if answer to either of the following is "Yes", a more complete depression screening is indicated)  Over the past two weeks, have you felt down, depressed or hopeless? No Over the past two weeks, have you felt little interest or pleasure in doing things? No Have you lost interest or pleasure in daily life? No Do you often feel hopeless? No Do you cry easily over simple problems? No   Activities of Daily Living  In your present state of health, do you have any difficulty performing the following activities?:  Driving? No  Managing money? No  Feeding yourself? No  Getting from bed to chair? No  Climbing a flight of stairs? No  Preparing food and eating?: No  Bathing or showering? No  Getting dressed: No  Getting to the toilet? No  Using the toilet:No  Moving around from place to place: No  In the past year have you fallen or had a near fall?:No  Are you sexually active? No  Do you have more than one partner? No   Hearing Difficulties: No  Do you often ask people to speak up or repeat themselves? No  Do you experience ringing or noises in your ears? No Do you have difficulty understanding soft or whispered voices? No  Do you feel that you have a problem with memory? No Do you often misplace items? No  Do you feel safe at home? Yes  Cognitive Testing  Alert? Yes Normal Appearance?Yes  Oriented to person? Yes Place? Yes  Time? Yes  Recall of three objects? Yes  Can perform simple calculations? Yes  Displays appropriate judgment?Yes  Can read the correct time from a watch face?Yes   List the Names of Other  Physician/Practitioners you currently use:  Dr. Lyla Glassing at [place] any recent [place] you may have received from other than [facility] providers in the past year (date may be approximate).   Screening Tests / Date------ these are all documented above Colonoscopy                     Zostavax  Mammogram  Influenza Vaccine  Tetanus/tdap    Assessment:    Annual wellness medicare exam   Plan:    During the course of the visit the patient was educated and counseled about appropriate screening and preventive services including:  Screening mammography  Colorectal cancer screening  Shingles vaccine. Prescription given to that she can get the vaccine at the pharmacy or Medicare part D.  Screen + for depression. PHQ- 9 score of 12 (moderate depression). We discussed the options of counseling versus possibly a medication. I encouraged her strongly think about the counseling. She is going through some medical problems currently and her husband is as well Mrs. been very stressful for her. She says she will think about it. She does have Xanax to use as needed. Though she may benefit from an SSRI for her more depressive type symptoms but she wants to hold off at this time.  I aksed her to please have her cardioloist send  records since we have none on file.  Diet review for nutrition referral? Yes ____ Not Indicated __x__  Patient Instructions (the written plan) was given to the patient.  Medicare Attestation  I have personally reviewed:  The patient's medical and social history  Their use of alcohol, tobacco or illicit drugs  Their current medications and supplements  The patient's functional ability including ADLs,fall risks, home safety risks, cognitive, and hearing and visual impairment  Diet and physical activities  Evidence for depression or mood disorders  The patient's weight, height, BMI, and visual acuity have been recorded in the chart. I have made  referrals, counseling, and provided education to the patient based on review of the above and I have provided the patient with a written personalized care plan for preventive services.       Signed, [id number] [place], [place], [facility] 10/02/2014 8:20 AM

## 2014-10-03 ENCOUNTER — Other Ambulatory Visit: Payer: Self-pay | Admitting: Family Medicine

## 2014-10-03 DIAGNOSIS — E559 Vitamin D deficiency, unspecified: Secondary | ICD-10-CM | POA: Insufficient documentation

## 2014-10-03 LAB — VITAMIN D 25 HYDROXY (VIT D DEFICIENCY, FRACTURES): Vit D, 25-Hydroxy: 18 ng/mL — ABNORMAL LOW (ref 30–100)

## 2014-10-03 MED ORDER — VITAMIN D (ERGOCALCIFEROL) 1.25 MG (50000 UNIT) PO CAPS: 50000.0000 [IU] | ORAL_CAPSULE | ORAL | Status: DC

## 2014-11-04 ENCOUNTER — Other Ambulatory Visit: Payer: Self-pay | Admitting: Physician Assistant

## 2014-11-04 NOTE — Telephone Encounter (Signed)
Refill appropriate and filled per protocol. 

## 2014-12-02 ENCOUNTER — Other Ambulatory Visit: Payer: Self-pay | Admitting: Physician Assistant

## 2014-12-03 NOTE — Telephone Encounter (Signed)
Refill appropriate and filled per protocol. 

## 2014-12-15 ENCOUNTER — Other Ambulatory Visit: Payer: Self-pay | Admitting: Physician Assistant

## 2014-12-15 NOTE — Telephone Encounter (Signed)
Medication refilled per protocol. 

## 2014-12-29 NOTE — Telephone Encounter (Signed)
noted 

## 2014-12-30 ENCOUNTER — Ambulatory Visit (INDEPENDENT_AMBULATORY_CARE_PROVIDER_SITE_OTHER): Payer: Medicare Other | Admitting: *Deleted

## 2014-12-30 DIAGNOSIS — Z23 Encounter for immunization: Secondary | ICD-10-CM | POA: Diagnosis not present

## 2015-02-26 ENCOUNTER — Other Ambulatory Visit: Payer: Self-pay | Admitting: Physician Assistant

## 2015-02-26 NOTE — Telephone Encounter (Signed)
Medication refilled per protocol. 

## 2015-03-05 ENCOUNTER — Other Ambulatory Visit: Payer: Self-pay | Admitting: Physician Assistant

## 2015-03-05 ENCOUNTER — Encounter: Payer: Self-pay | Admitting: Family Medicine

## 2015-03-05 NOTE — Telephone Encounter (Signed)
Medication refill for one time only.  Patient needs to be seen.  Letter sent for patient to call and schedule 

## 2015-03-23 ENCOUNTER — Other Ambulatory Visit: Payer: Self-pay | Admitting: Physician Assistant

## 2015-03-24 NOTE — Telephone Encounter (Signed)
Medication refill for one time only.  Patient needs to be seen.  

## 2015-03-26 ENCOUNTER — Encounter: Payer: Self-pay | Admitting: Physician Assistant

## 2015-03-26 ENCOUNTER — Ambulatory Visit (INDEPENDENT_AMBULATORY_CARE_PROVIDER_SITE_OTHER): Payer: Medicare Other | Admitting: Physician Assistant

## 2015-03-26 VITALS — BP 152/82 | HR 96 | Temp 97.7°F | Resp 18 | Wt 162.0 lb

## 2015-03-26 DIAGNOSIS — K219 Gastro-esophageal reflux disease without esophagitis: Secondary | ICD-10-CM | POA: Diagnosis not present

## 2015-03-26 DIAGNOSIS — I10 Essential (primary) hypertension: Secondary | ICD-10-CM

## 2015-03-26 DIAGNOSIS — E559 Vitamin D deficiency, unspecified: Secondary | ICD-10-CM

## 2015-03-26 DIAGNOSIS — E119 Type 2 diabetes mellitus without complications: Secondary | ICD-10-CM | POA: Diagnosis not present

## 2015-03-26 DIAGNOSIS — E785 Hyperlipidemia, unspecified: Secondary | ICD-10-CM | POA: Diagnosis not present

## 2015-03-26 LAB — COMPLETE METABOLIC PANEL WITH GFR
ALBUMIN: 4.5 g/dL (ref 3.6–5.1)
ALK PHOS: 17 U/L — AB (ref 33–130)
ALT: 18 U/L (ref 6–29)
AST: 23 U/L (ref 10–35)
BILIRUBIN TOTAL: 0.6 mg/dL (ref 0.2–1.2)
BUN: 16 mg/dL (ref 7–25)
CALCIUM: 9.7 mg/dL (ref 8.6–10.4)
CO2: 25 mmol/L (ref 20–31)
CREATININE: 0.69 mg/dL (ref 0.60–0.93)
Chloride: 100 mmol/L (ref 98–110)
GFR, Est Non African American: 88 mL/min (ref >=60)
GLUCOSE: 135 mg/dL — AB (ref 70–99)
Potassium: 4.1 mmol/L (ref 3.5–5.3)
Sodium: 140 mmol/L (ref 135–146)
Total Protein: 7 g/dL (ref 6.1–8.1)

## 2015-03-26 MED ORDER — GABAPENTIN 300 MG PO CAPS: 300.0000 mg | ORAL_CAPSULE | Freq: Three times a day (TID) | ORAL | Status: DC

## 2015-03-26 MED ORDER — LISINOPRIL 20 MG PO TABS: 20.0000 mg | ORAL_TABLET | Freq: Every day | ORAL | Status: DC

## 2015-03-26 NOTE — Addendum Note (Signed)
Addended by: Dena Billet on: 03/26/2015 09:09 AM   Modules accepted: Orders

## 2015-03-26 NOTE — Progress Notes (Signed)
Patient ID: TYREKA SZELIGA MRN: [id number], DOB: Jan 08, 1946, 70 y.o. Date of Encounter: @DATE @  Chief Complaint:  Chief Complaint  Patient presents with  . 3 mth check up    also sick with stomach pains, diarrhea, is fasting    HPI: 70 y.o. year old female  presents for [place].   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  At [place] 05/2013 she said that she and her husband would be getting busier coming up soon--planting their garden. Said that they also planned to buy 6 more chickens. Said that she already had 6 chickens.  At f/u OV she said she often has been volunteering several days at the school helping out. Continues to be active outside in the yard.  As well, at office visit 11/2013 she says that she has continued to do her walking--been walking 15-20 minutes once a day either in the morning or the evening depending on the weather weather etc.  As well she has told me in the past that she is careful with her diet. Says that she has never really been one to snack or munch and tries to watch what she eats for meals.  She is taking her lisinopril as directed. No adverse effects and no lightheadedness or edema.  She reports that even with exertion she has no chest pressure heaviness tightness or increased dyspnea.  At [place] 06/2014--reports that she has been having a lot of problem with her left knee. Says that was one of the reasons she went ahead and retired was that that knee was really bothering her. However says it is bothering her now and affecting her activity. Says that when she does her walking she feels like it is bone on bone. Says yesterday she was at the grocery store and it was giving way--says that she was holding to the grocery cart but still it was giving way and causing problems. Says that she had arthroscopic surgery to that knee > 10 years ago.  At that OV I referred her to Ortho.  At [place] 10/02/2014:  Reports she saw Dr. Lyla Glassing at  [facility].  Says she has "no cartilage. Bone on bone." He treated with an injection. Told her some exercises to do to strengthen the muscles above the knee. Gave her some wedges for her shoes. Says that the alternative would be a total knee replacement. She says that she really does not want to have that done unless she absolutely has to. Says that the shot did help but seems to be wearing off. Not want to keep getting shots and left she was she absolutely has to either.  At this visit also states that her older sister recently died with colon cancer. Patient and her younger sister cared for her those last few weeks. No other complaints or concerns. Otherwise is been doing well. She has a garden with tomatoes squash cucumbers okra green beans etc. Also her chickens.  At [place] 03/26/2015: Says that on Monday she developed nausea and then abdominal cramping and pain diffusely. Diarrhea but no vomiting. Says regarding the diarrhea she had a lot on Monday and Tuesday. Wednesday it had decreased to about 4 or 5 episodes that day. Today so far has only had one episode. No other complaints or concerns. When doing her foot exam she does mention that her feet feel numb and that she occasionally feels tingle sensation/discomfort in her foot.   Past Medical History  Diagnosis  Date  . Hypertension   . GERD (gastroesophageal reflux disease)     hiatal hernia  . Diabetes mellitus without complication ([place])   . Allergy     seasonal  . Asthma   . H/O: gout   . Hyperlipidemia      Home Meds:  Outpatient Prescriptions Prior to Visit  Medication Sig Dispense Refill  . aspirin 81 MG tablet Take 81 mg by mouth daily.    . calcium carbonate (OS-CAL) 600 MG TABS tablet Take 600 mg by mouth daily with breakfast.    . Choline Fenofibrate (FENOFIBRIC ACID) 135 MG CPDR TAKE ONE CAPSULE BY MOUTH EVERY NIGHT AT BEDTIME 90 capsule 3  . Diclofenac Sodium CR 100 MG 24 hr tablet TAKE ONE (1) TABLET EACH DAY 30  tablet 2  . fluticasone (FLONASE) 50 MCG/ACT nasal spray Place 2 sprays into the nose daily as needed.    Marland Kitchen lisinopril (PRINIVIL,ZESTRIL) 20 MG tablet TAKE ONE TABLET BY MOUTH ONCE DAILY 30 tablet 0  . metFORMIN (GLUCOPHAGE) 500 MG tablet TAKE ONE TABLET TWICE DAILY WITH A MEAL 180 tablet 0  . Multiple Vitamin (MULTIVITAMIN) tablet Take 1 tablet by mouth daily.    Marland Kitchen omeprazole (PRILOSEC) 20 MG capsule Take 1 capsule (20 mg total) by mouth daily. 30 capsule 6  . PREMARIN 0.625 MG tablet TAKE ONE (1) TABLET BY MOUTH EVERY DAY 30 tablet 11  . gabapentin (NEURONTIN) 300 MG capsule TAKE ONE (1) CAPSULE BY MOUTH TWICE A DAY. (EVERY 12 HOURS.) 60 capsule 5  . Vitamin D, Ergocalciferol, (DRISDOL) 50000 UNITS CAPS capsule Take 1 capsule (50,000 Units total) by mouth every 7 (seven) days. 12 capsule 0   No facility-administered medications prior to visit.     Allergies:  Allergies  Allergen Reactions  . Codeine Nausea And Vomiting  . Macrobid [Nitrofurantoin] Hives    Social History   Social History  . Marital Status: Single    Spouse Name: N/A  . Number of Children: N/A  . Years of Education: N/A   Occupational History  . Not on file.   Social History Main Topics  . Smoking status: Never Smoker   . Smokeless tobacco: Never Used  . Alcohol Use: No  . Drug Use: No  . Sexual Activity: Not on file   Other Topics Concern  . Not on file   Social History Narrative   Retired March 2014.   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 Stroke Mother   . Diabetes Brother   . Cancer Neg Hx   . Heart disease Neg Hx      Review of Systems:  See HPI for pertinent ROS. All other ROS negative.    Physical Exam: Blood pressure 152/82, pulse 96, temperature 97.7 F (36.5 C), temperature source Oral, resp. rate 18, weight 162 lb (73.483 kg)., Body mass index is 28.7 kg/(m^2). General: Obese WF. Appears in no acute distress. Neck:  Supple. No thyromegaly. No lymphadenopathy. No carotid bruits. Lungs: Clear bilaterally to auscultation without wheezes, rales, or rhonchi. Breathing is unlabored. Heart: RRR with S1 S2. No murmurs, rubs, or gallops. Abdomen: Soft, non-tender, non-distended with normoactive bowel sounds. No hepatomegaly. No rebound/guarding. No obvious abdominal masses. Musculoskeletal:  Strength and tone normal for age. Extremities/Skin: Warm and dry. No clubbing or cyanosis. No edema. No rashes or suspicious lesions. Neuro: Alert and oriented X 3. Moves all extremities spontaneously. Gait is normal. CNII-XII grossly  in tact. Psych:  Responds to questions appropriately with a normal affect. Diabetic Foot exam: Inspection is normal. Sensation is normal and intact.  1+ - 2+ DP pulses bilaterally. No palpable PT  pulses bilaterally.      ASSESSMENT AND PLAN:  70 y.o. year old female with  1. Diabetes mellitus without complication  Check [id number] now.  Last microalbumin was 12/11/2013---recheck 03/26/2015  She is on ACE inhibitor. Lisinopril 20 mg daily. She is on no statin. However last 2  LDLs have been 68 and 76-- which is goal. She is on aspirin 81 mg daily.  At visit 03/26/15 she reports that her feet do feel numb and that occasionally she feels a tingly sensation in her foot. At that visit it was noted that she was taking her Neurontin just twice daily so was increased to 3 times a day. At Follow-up visit if symptoms don't not controlled can further increase the dose.  Summer 2015 -- she did have an eye exam at [place].                           Says that she was told that she had no diabetic eye disease. At visit 06/2014 she states that she just received a letter stating that she was due for her eye exam. She has not yet called to schedule it, but will do so.   2. Hypertension   Blood Pressure reading high at visit 03/26/15 but she is currently sick with GI virus. Review the blood pressure was normal and  controlled at visit 10/02/14. Not adjust medicines now but simply monitor at follow-up. CMET normal  11/2013 On ACE Inh.  3. Hyperlipidemia On no Statin. FLP 11/2013--LDL = 76  Vitamin D Deficiency 10/02/14 vitamin D level was 18. At that time, told her to take 50,000 units once a week for 12 weeks and once completed that, to take otc 2,000 units daily. Follow-up visit 03/26/15 she states that she did take the prescription strength as directed. Since she completed that, she has been taking over-the-counter 1000 units daily.  Says that she was able to find a 1000 strength but not the 2000 strength. At visit 03/26/15 we'll recheck vitamin D level and adjust dose accordingly.  Left knee pain After office visit 06/2014 she saw Dr. Lyla Glassing at [facility]. Patient states that she was told she has "no cartilage and bone on bone" in that left knee. Says that she was treated with injection,  Exercises, wedges in in her shoes. Was told that the alternative would be a total knee replacement. She does not want to undergo this surgery unless absolutely necessary. Also does not want to have to do repeat injections unless absolutely necessary.  GERD (gastroesophageal reflux disease) Symptoms controlled with current medication.  Allergy Stable.   Asthma No recent flares.   H/O: gout No Recent flare.  Hormone replacement therapy: We have discussed risk multiple times including cardiovascular risk of blood clots risk and risk of breast cancer and uterine cancer. She is aware of risk. Has opted to continue the medication. Without the medication she has severe hot flashes and other symptoms.  Has history of hysterectomy. This was not performed secondary to any cancer. Therefore she does not need repeat Pap smear. At visit 09/2014 she refuses/defers pelvic exam. Says that she has no uterus and has no ovaries and a half nothing there to examine and does not want to go through that.  Mammogram: Says  that she had  mammogram  04/03/2014--negative  DEXA.: August 2011: Normal   At visit 06/2014 discussed the fact that it is been 5 years since last bone density test and things could have changed. However, she defers rechecking at this time. She is on HRT   which   decreases her risk for developing osteoporosis. Will rediscuss in future. Rediscussed at [place] 09/2014--still defers.  --- Last vitamin D level was checked 11/28/2012. Level was normal at 45. See above Regarding Vitamin D Deficiency 09/2014, 03/2015  Colonoscopy: July 2008. Repeat 10 years.  Immunizations: Pneumonia vaccine:              --Pneumovax was given here in November 2012.  -- Prevnar 13 given here 06/06/2013 Zostavax:  07/02/2014--Insurance does pay for Zostavax-----Zostavax given 07/02/2014 Flu vaccine--Given here 03/12/2014 Tetanus: Will not give as she has Medicare  Viral GastroEnteritis: Liquid diet with chicken noodle soup and ginger ale. Follow-up if does not continue to improve/resolve over the next 48 hours   Can wait 6 months for followup visit if labs are all stable. Otherwise will need followup 3 months.    Signed, [address] [place], [place], [facility] 03/26/2015 8:30 AM

## 2015-03-27 LAB — VITAMIN D 25 HYDROXY (VIT D DEFICIENCY, FRACTURES): Vit D, 25-Hydroxy: 28 ng/mL — ABNORMAL LOW (ref 30–100)

## 2015-03-27 LAB — HEMOGLOBIN A1C
HEMOGLOBIN A1C: 6.4 % — AB (ref <5.7)
MEAN PLASMA GLUCOSE: 137 mg/dL — AB (ref <117)

## 2015-03-27 LAB — MICROALBUMIN, URINE: MICROALB UR: 27 mg/dL

## 2015-04-06 DIAGNOSIS — Z1231 Encounter for screening mammogram for malignant neoplasm of breast: Secondary | ICD-10-CM | POA: Diagnosis not present

## 2015-04-06 DIAGNOSIS — Z803 Family history of malignant neoplasm of breast: Secondary | ICD-10-CM | POA: Diagnosis not present

## 2015-04-06 LAB — HM MAMMOGRAPHY

## 2015-04-08 ENCOUNTER — Encounter: Payer: Self-pay | Admitting: Family Medicine

## 2015-05-25 ENCOUNTER — Ambulatory Visit (INDEPENDENT_AMBULATORY_CARE_PROVIDER_SITE_OTHER): Payer: Medicare Other | Admitting: Physician Assistant

## 2015-05-25 ENCOUNTER — Encounter: Payer: Self-pay | Admitting: Physician Assistant

## 2015-05-25 VITALS — BP 136/74 | HR 80 | Temp 98.1°F | Resp 18 | Wt 165.0 lb

## 2015-05-25 DIAGNOSIS — L729 Follicular cyst of the skin and subcutaneous tissue, unspecified: Secondary | ICD-10-CM

## 2015-05-25 DIAGNOSIS — L089 Local infection of the skin and subcutaneous tissue, unspecified: Secondary | ICD-10-CM

## 2015-05-25 MED ORDER — SULFAMETHOXAZOLE-TRIMETHOPRIM 800-160 MG PO TABS: 1.0000 | ORAL_TABLET | Freq: Two times a day (BID) | ORAL | Status: DC

## 2015-05-25 NOTE — Progress Notes (Signed)
    Patient ID: JAQUELIN RZEPECKI MRN: [id number], DOB: Oct 27, 1945, 70 y.o. Date of Encounter: 05/25/2015, 9:49 AM    Chief Complaint:  Chief Complaint  Patient presents with  . infected cyst on right side     HPI: 70 y.o. year old female presents with above.   Says that years ago she had this same problem. Says that at that time we did I&D but it has reoccurred. Cyst just recently became sore over the last couple of days and draining this morning.     Home Meds:   Outpatient Prescriptions Prior to Visit  Medication Sig Dispense Refill  . aspirin 81 MG tablet Take 81 mg by mouth daily.    . calcium carbonate (OS-CAL) 600 MG TABS tablet Take 600 mg by mouth daily with breakfast.    . cholecalciferol (VITAMIN D) 1000 units tablet Take 1,000 Units by mouth daily.    . Choline Fenofibrate (FENOFIBRIC ACID) 135 MG CPDR TAKE ONE CAPSULE BY MOUTH EVERY NIGHT AT BEDTIME 90 capsule 3  . Diclofenac Sodium CR 100 MG 24 hr tablet TAKE ONE (1) TABLET EACH DAY 30 tablet 2  . fluticasone (FLONASE) 50 MCG/ACT nasal spray Place 2 sprays into the nose daily as needed.    . gabapentin (NEURONTIN) 300 MG capsule Take 1 capsule (300 mg total) by mouth 3 (three) times daily. 90 capsule 5  . lisinopril (PRINIVIL,ZESTRIL) 20 MG tablet Take 1 tablet (20 mg total) by mouth daily. 90 tablet 1  . metFORMIN (GLUCOPHAGE) 500 MG tablet TAKE ONE TABLET TWICE DAILY WITH A MEAL 180 tablet 0  . Multiple Vitamin (MULTIVITAMIN) tablet Take 1 tablet by mouth daily.    Marland Kitchen omeprazole (PRILOSEC) 20 MG capsule Take 1 capsule (20 mg total) by mouth daily. 30 capsule 6  . PREMARIN 0.625 MG tablet TAKE ONE (1) TABLET BY MOUTH EVERY DAY 30 tablet 11   No facility-administered medications prior to visit.    Allergies:  Allergies  Allergen Reactions  . Codeine Nausea And Vomiting  . Macrobid [Nitrofurantoin] Hives      Review of Systems: See HPI for pertinent ROS. All other ROS negative.    Physical Exam: Blood  pressure 136/74, pulse 80, temperature 98.1 F (36.7 C), temperature source Oral, resp. rate 18, weight 165 lb (74.844 kg)., Body mass index is 29.24 kg/(m^2). General:  Obese WF. Appears in no acute distress. Neck: Supple. No thyromegaly. No lymphadenopathy. Lungs: Clear bilaterally to auscultation without wheezes, rales, or rhonchi. Breathing is unlabored. Heart: Regular rhythm. No murmurs, rubs, or gallops. Msk:  Strength and tone normal for age. Skin: Right Flank/Side--at level of bra strap---Approx 2 cm diameter area of erythema, firmness. Actively draining---I manually pressed out large amount of purulent drainage and cystic material.  Neuro: Alert and oriented X 3. Moves all extremities spontaneously. Gait is normal. CNII-XII grossly in tact. Psych:  Responds to questions appropriately with a normal affect.     ASSESSMENT AND PLAN:  70 y.o. year old female with  1. Infected cyst of skin Take Bactrim as directed. Start immediately. F/U if symptoms not improved in 48 hours. Will refer to dermatology to excise cyst. - sulfamethoxazole-trimethoprim (BACTRIM DS,SEPTRA DS) 800-160 MG tablet; Take 1 tablet by mouth 2 (two) times daily.  Dispense: 20 tablet; Refill: 0 - Ambulatory referral to Dermatology   Signed, [facility] [place], [place], [facility] 05/25/2015 9:49 AM

## 2015-06-01 ENCOUNTER — Other Ambulatory Visit: Payer: Self-pay | Admitting: Physician Assistant

## 2015-06-01 NOTE — Telephone Encounter (Signed)
Medication refilled per protocol. 

## 2015-06-04 DIAGNOSIS — L723 Sebaceous cyst: Secondary | ICD-10-CM | POA: Diagnosis not present

## 2015-06-18 DIAGNOSIS — L723 Sebaceous cyst: Secondary | ICD-10-CM | POA: Diagnosis not present

## 2015-07-01 ENCOUNTER — Other Ambulatory Visit: Payer: Self-pay | Admitting: Physician Assistant

## 2015-07-01 NOTE — Telephone Encounter (Signed)
Refill appropriate and filled per protocol. 

## 2015-08-03 ENCOUNTER — Other Ambulatory Visit: Payer: Self-pay | Admitting: Physician Assistant

## 2015-08-03 NOTE — Telephone Encounter (Signed)
Medication refilled per protocol. 

## 2015-09-23 ENCOUNTER — Ambulatory Visit (INDEPENDENT_AMBULATORY_CARE_PROVIDER_SITE_OTHER): Payer: Medicare Other | Admitting: Physician Assistant

## 2015-09-23 ENCOUNTER — Encounter: Payer: Self-pay | Admitting: Physician Assistant

## 2015-09-23 VITALS — BP 122/72 | Temp 98.0°F | Ht 63.0 in | Wt 166.0 lb

## 2015-09-23 DIAGNOSIS — I10 Essential (primary) hypertension: Secondary | ICD-10-CM | POA: Diagnosis not present

## 2015-09-23 DIAGNOSIS — E785 Hyperlipidemia, unspecified: Secondary | ICD-10-CM

## 2015-09-23 DIAGNOSIS — E538 Deficiency of other specified B group vitamins: Secondary | ICD-10-CM | POA: Diagnosis not present

## 2015-09-23 DIAGNOSIS — E119 Type 2 diabetes mellitus without complications: Secondary | ICD-10-CM | POA: Diagnosis not present

## 2015-09-23 DIAGNOSIS — K219 Gastro-esophageal reflux disease without esophagitis: Secondary | ICD-10-CM

## 2015-09-23 DIAGNOSIS — E559 Vitamin D deficiency, unspecified: Secondary | ICD-10-CM

## 2015-09-23 LAB — COMPLETE METABOLIC PANEL WITH GFR
ALT: 16 U/L (ref 6–29)
AST: 18 U/L (ref 10–35)
Albumin: 4.4 g/dL (ref 3.6–5.1)
Alkaline Phosphatase: 17 U/L — ABNORMAL LOW (ref 33–130)
BUN: 18 mg/dL (ref 7–25)
CHLORIDE: 102 mmol/L (ref 98–110)
CO2: 26 mmol/L (ref 20–31)
Calcium: 9.5 mg/dL (ref 8.6–10.4)
Creat: 0.77 mg/dL (ref 0.60–0.93)
GFR, EST NON AFRICAN AMERICAN: 78 mL/min (ref >=60)
Glucose, Bld: 106 mg/dL — ABNORMAL HIGH (ref 70–99)
POTASSIUM: 4.8 mmol/L (ref 3.5–5.3)
Sodium: 140 mmol/L (ref 135–146)
Total Bilirubin: 0.5 mg/dL (ref 0.2–1.2)
Total Protein: 6.6 g/dL (ref 6.1–8.1)

## 2015-09-23 LAB — LIPID PANEL
CHOL/HDL RATIO: 3.4 ratio (ref <=5.0)
Cholesterol: 174 mg/dL (ref 125–200)
HDL: 51 mg/dL (ref >=46)
LDL CALC: 76 mg/dL (ref <130)
TRIGLYCERIDES: 237 mg/dL — AB (ref <150)
VLDL: 47 mg/dL — AB (ref <30)

## 2015-09-23 LAB — MAGNESIUM: Magnesium: 1.2 mg/dL — ABNORMAL LOW (ref 1.5–2.5)

## 2015-09-23 LAB — VITAMIN B12: VITAMIN B 12: 235 pg/mL (ref 200–1100)

## 2015-09-23 LAB — HEMOGLOBIN A1C
Hgb A1c MFr Bld: 6.2 % — ABNORMAL HIGH (ref <5.7)
Mean Plasma Glucose: 131 mg/dL

## 2015-09-23 NOTE — Progress Notes (Signed)
Patient ID: MIRASOL BUTERA MRN: [id number], DOB: 09/09/1945, 70 y.o. Date of Encounter: @DATE @  Chief Complaint:  Chief Complaint  Patient presents with  . Routine follow up    6 mth follow up     HPI: 70 y.o. year old female  presents for ROV.   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  At [place] 05/2013 she said that she and her husband would be getting busier coming up soon--planting their garden. Said that they also planned to buy 6 more chickens. Said that she already had 6 chickens.  At f/u OV she said she often has been volunteering several days at the school helping out. Continues to be active outside in the yard.  As well, at office visit 11/2013 she says that she has continued to do her walking--been walking 15-20 minutes once a day either in the morning or the evening depending on the weather weather etc.  As well she has told me in the past that she is careful with her diet. Says that she has never really been one to snack or munch and tries to watch what she eats for meals.  She is taking her lisinopril as directed. No adverse effects and no lightheadedness or edema.  She reports that even with exertion she has no chest pressure heaviness tightness or increased dyspnea.  At [place] 06/2014--reports that she has been having a lot of problem with her left knee. Says that was one of the reasons she went ahead and retired was that that knee was really bothering her. However says it is bothering her now and affecting her activity. Says that when she does her walking she feels like it is bone on bone. Says yesterday she was at the grocery store and it was giving way--says that she was holding to the grocery cart but still it was giving way and causing problems. Says that she had arthroscopic surgery to that knee > 10 years ago.  At that OV I referred her to Ortho.  At [place] 10/02/2014:  Reports she saw Dr. Lyla Glassing at [facility].  Says she has  "no cartilage. Bone on bone." He treated with an injection. Told her some exercises to do to strengthen the muscles above the knee. Gave her some wedges for her shoes. Says that the alternative would be a total knee replacement. She says that she really does not want to have that done unless she absolutely has to. Says that the shot did help but seems to be wearing off. Not want to keep getting shots and left she was she absolutely has to either.  At this visit also states that her older sister recently died with colon cancer. Patient and her younger sister cared for her those last few weeks. No other complaints or concerns. Otherwise is been doing well. She has a garden with tomatoes squash cucumbers okra green beans etc. Also her chickens.  OV 09/23/2015: Diabetes: She is taking her metformin as directed. Also eats low carbohydrate diet. Also stays active and walks on a regular basis. She states that her peripheral neuropathy symptoms are controlled with current dose of gabapentin. Hypertension: She is taking her lisinopril as directed. No lightheadedness or other adverse effects. GERD: She states that she does not have to take her Prilosec every day but only as needed for acid reflux/heartburn symptoms. Vitamin D deficiency: She states that she is taking vitamin D as directed at last lab results note.  Past Medical History  Diagnosis Date  . Hypertension   . GERD (gastroesophageal reflux disease)     hiatal hernia  . Diabetes mellitus without complication ([place])   . Allergy     seasonal  . Asthma   . H/O: gout   . Hyperlipidemia      Home Meds:  Outpatient Prescriptions Prior to Visit  Medication Sig Dispense Refill  . aspirin 81 MG tablet Take 81 mg by mouth daily.    . calcium carbonate (OS-CAL) 600 MG TABS tablet Take 600 mg by mouth daily with breakfast.    . cholecalciferol (VITAMIN D) 1000 units tablet Take 1,000 Units by mouth daily.    . Choline Fenofibrate (FENOFIBRIC ACID)  135 MG CPDR TAKE ONE CAPSULE BY MOUTH EVERY NIGHT AT BEDTIME 90 capsule 3  . Diclofenac Sodium CR 100 MG 24 hr tablet TAKE ONE (1) TABLET EACH DAY 30 tablet 2  . fluticasone (FLONASE) 50 MCG/ACT nasal spray Place 2 sprays into the nose daily as needed.    . gabapentin (NEURONTIN) 300 MG capsule Take 1 capsule (300 mg total) by mouth 3 (three) times daily. 90 capsule 5  . lisinopril (PRINIVIL,ZESTRIL) 20 MG tablet Take 1 tablet (20 mg total) by mouth daily. 90 tablet 1  . metFORMIN (GLUCOPHAGE) 500 MG tablet TAKE ONE TABLET TWICE DAILY WITH A MEAL 180 tablet 1  . Multiple Vitamin (MULTIVITAMIN) tablet Take 1 tablet by mouth daily.    Marland Kitchen omeprazole (PRILOSEC) 20 MG capsule Take 1 capsule (20 mg total) by mouth daily. 30 capsule 6  . PREMARIN 0.625 MG tablet TAKE ONE (1) TABLET BY MOUTH EVERY DAY 90 tablet 0  . sulfamethoxazole-trimethoprim (BACTRIM DS,SEPTRA DS) 800-160 MG tablet Take 1 tablet by mouth 2 (two) times daily. 20 tablet 0   No facility-administered medications prior to visit.     Allergies:  Allergies  Allergen Reactions  . Codeine Nausea And Vomiting  . Macrobid [Nitrofurantoin] Hives    Social History   Social History  . Marital Status: Single    Spouse Name: N/A  . Number of Children: N/A  . Years of Education: N/A   Occupational History  . Not on file.   Social History Main Topics  . Smoking status: Never Smoker   . Smokeless tobacco: Never Used  . Alcohol Use: No  . Drug Use: No  . Sexual Activity: Not on file   Other Topics Concern  . Not on file   Social History Narrative   Retired March 2014.   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 Stroke Mother   . Diabetes Brother   . Cancer Neg Hx   . Heart disease Neg Hx      Review of Systems:  See HPI for pertinent ROS. All other ROS negative.    Physical Exam: Blood pressure 122/72, temperature 98 F (36.7 C), temperature source Oral, height  5\' 3"  (1.6 m), weight 166 lb (75.297 kg)., Body mass index is 29.41 kg/(m^2). General: Obese WF. Appears in no acute distress. Neck: Supple. No thyromegaly. No lymphadenopathy. No carotid bruits. Lungs: Clear bilaterally to auscultation without wheezes, rales, or rhonchi. Breathing is unlabored. Heart: RRR with S1 S2. No murmurs, rubs, or gallops. Abdomen: Soft, non-tender, non-distended with normoactive bowel sounds. No hepatomegaly. No rebound/guarding. No obvious abdominal masses. Musculoskeletal:  Strength and tone normal for age. Extremities/Skin: Warm and dry. No clubbing or cyanosis. No edema. No  rashes or suspicious lesions. Neuro: Alert and oriented X 3. Moves all extremities spontaneously. Gait is normal. CNII-XII grossly in tact. Psych:  Responds to questions appropriately with a normal affect. Diabetic Foot exam: Inspection is normal. Sensation is normal and intact.  1+ - 2+ DP pulses bilaterally. No palpable PT  pulses bilaterally.      ASSESSMENT AND PLAN:  70 y.o. year old female with  1. Diabetes mellitus without complication  Check [id number] now.  Last microalbumin was 12/11/2013---recheck 03/26/2015  She is on ACE inhibitor. Lisinopril 20 mg daily. She is on no statin. However last 2  LDLs have been 68 and 76-- which is goal. She is on aspirin 81 mg daily.  At visit 03/26/15 she reports that her feet do feel numb and that occasionally she feels a tingly sensation in her foot. At that visit it was noted that she was taking her Neurontin just twice daily so was increased to 3 times a day. At Follow-up visit if symptoms don't not controlled can further increase the dose.  Summer 2015 -- she did have an eye exam at [place].                           Says that she was told that she had no diabetic eye disease. At visit 06/2014 she states that she just received a letter stating that she was due for her eye exam. She has not yet called to schedule it, but will do so.   2.  Hypertension   Blood Pressure at goal/controlled. Cont current med. Check lab to monitor. CMET normal  11/2013 On ACE Inh.  3. Hyperlipidemia On no Statin. FLP 11/2013--LDL = 76  Vitamin D Deficiency 10/02/14 vitamin D level was 18. At that time, told her to take 50,000 units once a week for 12 weeks and once completed that, to take otc 2,000 units daily. Follow-up visit 03/26/15 she states that she did take the prescription strength as directed. Since she completed that, she has been taking over-the-counter 1000 units daily.  Says that she was able to find a 1000 strength but not the 2000 strength. At visit 03/26/15 we'll recheck vitamin D level and adjust dose accordingly.  Left knee pain After office visit 06/2014 she saw Dr. Lyla Glassing at [facility]. Patient states that she was told she has "no cartilage and bone on bone" in that left knee. Says that she was treated with injection,  Exercises, wedges in in her shoes. Was told that the alternative would be a total knee replacement. She does not want to undergo this surgery unless absolutely necessary. Also does not want to have to do repeat injections unless absolutely necessary.  GERD (gastroesophageal reflux disease) Symptoms controlled with current medication. Mg and B12 levels checked 09/23/2015   Allergy Stable.   Asthma No recent flares.   H/O: gout No Recent flare.  Hormone replacement therapy: We have discussed risk multiple times including cardiovascular risk of blood clots risk and risk of breast cancer and uterine cancer. She is aware of risk. Has opted to continue the medication. Without the medication she has severe hot flashes and other symptoms.  Has history of hysterectomy. This was not performed secondary to any cancer. Therefore she does not need repeat Pap smear. At visit 09/2014 she refuses/defers pelvic exam. Says that she has no uterus and has no ovaries and a half nothing there to examine and does not want  to go  through that.  Mammogram: Says that she had  mammogram  04/03/2014--negative  DEXA.: August 2011: Normal   At visit 06/2014 discussed the fact that it is been 5 years since last bone density test and things could have changed. However, she defers rechecking at this time. She is on HRT   which   decreases her risk for developing osteoporosis. Will rediscuss in future. Rediscussed at [place] 09/2014--still defers.  --- Last vitamin D level was checked 11/28/2012. Level was normal at 45. See above Regarding Vitamin D Deficiency 09/2014, 03/2015  Colonoscopy: July 2008. Repeat 10 years.  Immunizations: Pneumonia vaccine:              --Pneumovax was given here in November 2012.  -- Prevnar 13 given here 06/06/2013 Zostavax:  07/02/2014--Insurance does pay for Zostavax-----Zostavax given 07/02/2014 Flu vaccine--Given here 03/12/2014 Tetanus: Will not give as she has Medicare    Can wait 6 months for followup visit if labs are all stable. Otherwise will need followup 3 months.    Signed, [address] [place], [place], [facility] 09/23/2015 8:52 AM

## 2015-10-16 ENCOUNTER — Other Ambulatory Visit: Payer: Self-pay | Admitting: Physician Assistant

## 2015-10-16 NOTE — Telephone Encounter (Signed)
Refill appropriate and filled per protocol. 

## 2015-10-30 ENCOUNTER — Other Ambulatory Visit: Payer: Self-pay | Admitting: Physician Assistant

## 2015-10-30 NOTE — Telephone Encounter (Signed)
Medication refilled per protocol. 

## 2015-11-30 ENCOUNTER — Other Ambulatory Visit: Payer: Self-pay | Admitting: Physician Assistant

## 2015-11-30 NOTE — Telephone Encounter (Signed)
Refill appropriate and filled per protocol. 

## 2016-01-13 ENCOUNTER — Other Ambulatory Visit: Payer: Self-pay | Admitting: Physician Assistant

## 2016-02-29 ENCOUNTER — Other Ambulatory Visit: Payer: Self-pay | Admitting: Physician Assistant

## 2016-03-28 ENCOUNTER — Encounter: Payer: Self-pay | Admitting: Physician Assistant

## 2016-03-28 ENCOUNTER — Ambulatory Visit (INDEPENDENT_AMBULATORY_CARE_PROVIDER_SITE_OTHER): Payer: Medicare Other | Admitting: Physician Assistant

## 2016-03-28 VITALS — BP 124/80 | HR 85 | Temp 97.4°F | Resp 18 | Wt 162.8 lb

## 2016-03-28 DIAGNOSIS — R197 Diarrhea, unspecified: Secondary | ICD-10-CM | POA: Diagnosis not present

## 2016-03-28 DIAGNOSIS — E785 Hyperlipidemia, unspecified: Secondary | ICD-10-CM | POA: Diagnosis not present

## 2016-03-28 DIAGNOSIS — K219 Gastro-esophageal reflux disease without esophagitis: Secondary | ICD-10-CM | POA: Diagnosis not present

## 2016-03-28 DIAGNOSIS — E559 Vitamin D deficiency, unspecified: Secondary | ICD-10-CM

## 2016-03-28 DIAGNOSIS — I10 Essential (primary) hypertension: Secondary | ICD-10-CM | POA: Diagnosis not present

## 2016-03-28 DIAGNOSIS — E119 Type 2 diabetes mellitus without complications: Secondary | ICD-10-CM | POA: Diagnosis not present

## 2016-03-28 NOTE — Progress Notes (Addendum)
Patient ID: Jennifer Porter MRN: [id number], DOB: 10-25-1945, 71 y.o. Date of Encounter: @DATE @  Chief Complaint:  Chief Complaint  Patient presents with  . Blood Sugar Problem    6 mos f/u    HPI: 71 y.o. year old female  presents for ROV.   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  At multiple prior OVs  she said that she and her husband were busy with --planting their garden. Said that they also planned to buy 6 more chickens. Said that she already had 6 chickens.  At f/u OV she said she often has been volunteering several days at the school helping out. Continues to be active outside in the yard.  As well, at office visit 11/2013 she says that she has continued to do her walking--been walking 15-20 minutes once a day either in the morning or the evening depending on the weather weather etc.  As well she has told me in the past that she is careful with her diet. Says that she has never really been one to snack or munch and tries to watch what she eats for meals.   At [place] 06/2014--reports that she has been having a lot of problem with her left knee. Says that was one of the reasons she went ahead and retired was that that knee was really bothering her. However says it is bothering her now and affecting her activity. Says that when she does her walking she feels like it is bone on bone. Says yesterday she was at the grocery store and it was giving way--says that she was holding to the grocery cart but still it was giving way and causing problems. Says that she had arthroscopic surgery to that knee > 10 years ago.  At that OV I referred her to Ortho.  At [place] 10/02/2014:  Reports she saw Dr. Lyla Glassing at [facility].  Says she has "no cartilage. Bone on bone." He treated with an injection. Told her some exercises to do to strengthen the muscles above the knee. Gave her some wedges for her shoes. Says that the alternative would be a total knee  replacement. She says that she really does not want to have that done unless she absolutely has to. Says that the shot did help but seems to be wearing off. Not want to keep getting shots and left she was she absolutely has to either.  OV 03/28/2016: Today she does have one concern she wanted to discuss: Says that she will go for about 2 days and have normal stools. Then she will go about 2 days with loose stools. Also at times feels discomfort in her abdomen. Says that she has been noticing this getting worse over the past year. Says that she has seen commercials on TV for Viberzi but they say the medicine cannot be used if you do not have a gallbladder and she has had her gallbladder removed. Says that she isn't sure if it something she is eating so has tried to decrease her grits and her bread.  Has had no melena or hematochezia. No focal/localized abdominal pain. No other concerns today. Diabetes: She is taking her metformin as directed. Also eats low carbohydrate diet. Also stays active and walks on a regular basis. She states that her peripheral neuropathy symptoms are controlled with current dose of gabapentin. Hypertension: She is taking her lisinopril as directed. No lightheadedness or other adverse effects. GERD: She states that she  does not have to take her Prilosec every day but only as needed for acid reflux/heartburn symptoms. Vitamin D deficiency: She states that she is taking vitamin D as directed at last lab results note.  Past Medical History:  Diagnosis Date  . Allergy    seasonal  . Asthma   . Diabetes mellitus without complication ([place])   . GERD (gastroesophageal reflux disease)    hiatal hernia  . H/O: gout   . Hyperlipidemia   . Hypertension      Home Meds:  Outpatient Medications Prior to Visit  Medication Sig Dispense Refill  . aspirin 81 MG tablet Take 81 mg by mouth daily.    . calcium carbonate (OS-CAL) 600 MG TABS tablet Take 600 mg by mouth daily with  breakfast.    . cholecalciferol (VITAMIN D) 1000 units tablet Take 1,000 Units by mouth daily.    . Choline Fenofibrate (FENOFIBRIC ACID) 135 MG CPDR TAKE ONE CAPSULE BY MOUTH EVERY NIGHT AT BEDTIME 90 capsule 1  . Diclofenac Sodium CR 100 MG 24 hr tablet TAKE ONE (1) TABLET EACH DAY 30 tablet 2  . gabapentin (NEURONTIN) 300 MG capsule Take 1 capsule (300 mg total) by mouth 3 (three) times daily. 90 capsule 5  . lisinopril (PRINIVIL,ZESTRIL) 20 MG tablet TAKE ONE (1) TABLET BY MOUTH EVERY DAY 90 tablet 1  . metFORMIN (GLUCOPHAGE) 500 MG tablet TAKE ONE TABLET TWICE DAILY WITH A MEAL 180 tablet 1  . Multiple Vitamin (MULTIVITAMIN) tablet Take 1 tablet by mouth daily.    Marland Kitchen omeprazole (PRILOSEC) 20 MG capsule Take 1 capsule (20 mg total) by mouth daily. 30 capsule 6  . PREMARIN 0.625 MG tablet TAKE ONE (1) TABLET EACH DAY 90 tablet 1  . fluticasone (FLONASE) 50 MCG/ACT nasal spray Place 2 sprays into the nose daily as needed.     No facility-administered medications prior to visit.      Allergies:  Allergies  Allergen Reactions  . Codeine Nausea And Vomiting  . Macrobid [Nitrofurantoin] Hives    Social History   Social History  . Marital status: Single    Spouse name: N/A  . Number of children: N/A  . Years of education: N/A   Occupational History  . Not on file.   Social History Main Topics  . Smoking status: Never Smoker  . Smokeless tobacco: Never Used  . Alcohol use No  . Drug use: No  . Sexual activity: Not on file   Other Topics Concern  . Not on file   Social History Narrative   Retired March 2014.   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 Stroke Mother   . Diabetes Brother   . Cancer Neg Hx   . Heart disease Neg Hx      Review of Systems:  See HPI for pertinent ROS. All other ROS negative.    Physical Exam: Blood pressure 124/80, pulse 85, temperature 97.4 F (36.3 C), temperature source Oral,  resp. rate 18, weight 162 lb 12.8 oz (73.8 kg), SpO2 95 %., Body mass index is 28.84 kg/m. General: Obese WF. Appears in no acute distress. Neck: Supple. No thyromegaly. No lymphadenopathy. No carotid bruits. Lungs: Clear bilaterally to auscultation without wheezes, rales, or rhonchi. Breathing is unlabored. Heart: RRR with S1 S2. No murmurs, rubs, or gallops. Abdomen: Soft, non-tender, non-distended with normoactive bowel sounds. No hepatomegaly. No rebound/guarding. No obvious abdominal masses. Musculoskeletal:  Strength and  tone normal for age. Extremities/Skin: Warm and dry. No clubbing or cyanosis. No edema. No rashes or suspicious lesions. Neuro: Alert and oriented X 3. Moves all extremities spontaneously. Gait is normal. CNII-XII grossly in tact. Psych:  Responds to questions appropriately with a normal affect. Diabetic Foot exam: Inspection is normal. Sensation is normal and intact.  1+ - 2+ DP pulses bilaterally. No palpable PT  pulses bilaterally.      ASSESSMENT AND PLAN:  71 y.o. year old female with    1. Diarrhea, unspecified type--Suspect IBS-D 03/28/2016: Will check labs including celiac panel. Told her to start probiotic recommend that she get ultra flora IB made by [employer] from the pharmacy [facility]. If she cannot get this particular one in using any over-the-counter probiotic. Told her to keep a paper with her and write down everything that she eats and drinks and also write down her stools for that day as far as whether they are normal or loose. Will try to see if we can see any pattern to see if it may be wheat/gluten sensitivity or dairy/lactose sensitivity causing her symptoms. Not to use fiber see because she has had gallbladder out. Discussed medications to use the images been tell etc. or referral to GI. Will start with the labs and above. - CBC with Differential/Platelet - COMPLETE METABOLIC PANEL WITH GFR - TSH - Celiac panel  10   Hypomagnesemia 03/28/2016: Magnesium was low on prior lab. Recheck now. - Magnesium  Diabetes mellitus without complication  [id number]: Check A1C now.      Check Micro Albumin Last microalbumin was 12/11/2013---recheck 03/26/2015, 03/28/2016  She is on ACE inhibitor. Lisinopril 20 mg daily. She is on no statin. However last 2  LDLs have been 68 and 76-- which is goal. She is on aspirin 81 mg daily.  At visit 03/26/15 she reports that her feet do feel numb and that occasionally she feels a tingly sensation in her foot. At that visit it was noted that she was taking her Neurontin just twice daily so was increased to 3 times a day. At Follow-up visit if symptoms don't not controlled can further increase the dose.  Summer 2015 -- she did have an eye exam at [place].                           Says that she was told that she had no diabetic eye disease. At visit 06/2014 she states that she just received a letter stating that she was due for her eye exam. She has not yet called to schedule it, but will do so.   2. Hypertension 03/28/2016:   Blood Pressure at goal/controlled. Cont current med. Check lab to monitor. On ACE Inh.  3. Hyperlipidemia On no Statin. FLP 11/2013--LDL = 76  Vitamin D Deficiency 10/02/14 vitamin D level was 18. At that time, told her to take 50,000 units once a week for 12 weeks and once completed that, to take otc 2,000 units daily. Follow-up visit 03/26/15 she states that she did take the prescription strength as directed. Since she completed that, she has been taking over-the-counter 1000 units daily.  Says that she was able to find a 1000 strength but not the 2000 strength. At visit 03/26/15 we'll recheck vitamin D level and adjust dose accordingly.  Left knee pain After office visit 06/2014 she saw Dr. Lyla Glassing at [facility]. Patient states that she was told she has "no cartilage  and bone on bone" in that left knee. Says that she was treated with injection,   Exercises, wedges in in her shoes. Was told that the alternative would be a total knee replacement. She does not want to undergo this surgery unless absolutely necessary. Also does not want to have to do repeat injections unless absolutely necessary.  GERD (gastroesophageal reflux disease) Symptoms controlled with current medication. Mg and B12 levels checked 09/23/2015 03/28/2016: Mg was low at that time. Recheck magnesium level now.  Allergy Stable.   Asthma No recent flares.   H/O: gout No Recent flare.  Hormone replacement therapy: We have discussed risk multiple times including cardiovascular risk of blood clots risk and risk of breast cancer and uterine cancer. She is aware of risk. Has opted to continue the medication. Without the medication she has severe hot flashes and other symptoms.  Has history of hysterectomy. This was not performed secondary to any cancer. Therefore she does not need repeat Pap smear. At visit 09/2014 she refuses/defers pelvic exam. Says that she has no uterus and has no ovaries and a half nothing there to examine and does not want to go through that.  Mammogram:  04/06/2015--negative  DEXA.: August 2011: Normal   At visit 06/2014 discussed the fact that it is been 5 years since last bone density test and things could have changed. However, she defers rechecking at this time. She is on HRT which   decreases her risk for developing osteoporosis. Will rediscuss in future. Rediscussed at [place] 09/2014--still defers.  DEXA: 05/03/2016: Normal ---Cont Calcium, Vit D.  --- Last vitamin D level was checked 11/28/2012. Level was normal at 45. See above Regarding Vitamin D Deficiency 09/2014, 03/2015  Colonoscopy: July 2008. Repeat 10 years. 03/28/2016: Discussed the colonoscopy due this upcoming summer.  Immunizations: Pneumonia vaccine:              --Pneumovax was given here in November 2012.  -- Prevnar 13 given here 06/06/2013 Zostavax:  Received 07/02/2014 Flu  vaccine--Given here 03/12/2014-03/28/2016: States that she received this at [facility] Tetanus: Will not give as she has Medicare        Signed, [address] [place], [place], [facility] 03/28/2016 9:18 AM

## 2016-03-29 LAB — CBC WITH DIFFERENTIAL/PLATELET
BASOS ABS: 69 {cells}/uL (ref 0–200)
BASOS PCT: 1 %
EOS ABS: 276 {cells}/uL (ref 15–500)
Eosinophils Relative: 4 %
HEMATOCRIT: 40.9 % (ref 35.0–45.0)
HEMOGLOBIN: 13.5 g/dL (ref 12.0–15.0)
Lymphocytes Relative: 32 %
Lymphs Abs: 2208 cells/uL (ref 850–3900)
MCH: 31 pg (ref 27.0–33.0)
MCHC: 33 g/dL (ref 32.0–36.0)
MCV: 94 fL (ref 80.0–100.0)
MONO ABS: 483 {cells}/uL (ref 200–950)
MPV: 10 fL (ref 7.5–12.5)
Monocytes Relative: 7 %
NEUTROS ABS: 3864 {cells}/uL (ref 1500–7800)
Neutrophils Relative %: 56 %
PLATELETS: 291 10*3/uL (ref 140–400)
RBC: 4.35 MIL/uL (ref 3.80–5.10)
RDW: 13.3 % (ref 11.0–15.0)
WBC: 6.9 10*3/uL (ref 3.8–10.8)

## 2016-03-29 LAB — HEMOGLOBIN A1C
Hgb A1c MFr Bld: 6 % — ABNORMAL HIGH (ref <5.7)
Mean Plasma Glucose: 126 mg/dL

## 2016-03-29 LAB — COMPLETE METABOLIC PANEL WITH GFR
ALBUMIN: 4.2 g/dL (ref 3.6–5.1)
ALK PHOS: 15 U/L — AB (ref 33–130)
ALT: 13 U/L (ref 6–29)
AST: 20 U/L (ref 10–35)
BILIRUBIN TOTAL: 0.4 mg/dL (ref 0.2–1.2)
BUN: 15 mg/dL (ref 7–25)
CALCIUM: 9.1 mg/dL (ref 8.6–10.4)
CO2: 29 mmol/L (ref 20–31)
CREATININE: 0.63 mg/dL (ref 0.60–0.93)
Chloride: 104 mmol/L (ref 98–110)
GFR, Est Non African American: >89 mL/min (ref >=60)
Glucose, Bld: 102 mg/dL — ABNORMAL HIGH (ref 70–99)
Potassium: 4.3 mmol/L (ref 3.5–5.3)
Sodium: 142 mmol/L (ref 135–146)
TOTAL PROTEIN: 6.7 g/dL (ref 6.1–8.1)

## 2016-03-29 LAB — MAGNESIUM: Magnesium: 1.2 mg/dL — ABNORMAL LOW (ref 1.5–2.5)

## 2016-03-29 LAB — TSH: TSH: 2.65 mIU/L

## 2016-04-01 LAB — CELIAC PNL 2 RFLX ENDOMYSIAL AB TTR
(tTG) Ab, IgA: <1 U/mL
(tTG) Ab, IgG: <1 U/mL
Endomysial Ab IgA: NEGATIVE
GLIADIN(DEAM) AB,IGG: 2 U (ref <20)
Gliadin(Deam) Ab,IgA: 5 U (ref <20)
IMMUNOGLOBULIN A: 126 mg/dL (ref 81–463)

## 2016-04-05 ENCOUNTER — Other Ambulatory Visit: Payer: Self-pay

## 2016-04-05 MED ORDER — MAGNESIUM OXIDE -MG SUPPLEMENT 400 MG PO CAPS: 1.0000 | ORAL_CAPSULE | Freq: Every day | ORAL | 5 refills | Status: DC

## 2016-04-21 DIAGNOSIS — Z1231 Encounter for screening mammogram for malignant neoplasm of breast: Secondary | ICD-10-CM | POA: Diagnosis not present

## 2016-04-21 DIAGNOSIS — Z803 Family history of malignant neoplasm of breast: Secondary | ICD-10-CM | POA: Diagnosis not present

## 2016-04-21 LAB — HM MAMMOGRAPHY

## 2016-04-25 ENCOUNTER — Other Ambulatory Visit: Payer: Self-pay | Admitting: Physician Assistant

## 2016-04-25 ENCOUNTER — Encounter: Payer: Self-pay | Admitting: Family Medicine

## 2016-04-25 NOTE — Telephone Encounter (Signed)
Refill appropriate 

## 2016-05-03 DIAGNOSIS — M85832 Other specified disorders of bone density and structure, left forearm: Secondary | ICD-10-CM | POA: Diagnosis not present

## 2016-05-03 LAB — HM DEXA SCAN: HM DEXA SCAN: NORMAL

## 2016-05-04 ENCOUNTER — Other Ambulatory Visit: Payer: Self-pay | Admitting: Physician Assistant

## 2016-05-04 NOTE — Telephone Encounter (Signed)
Rx filler per protocol

## 2016-05-13 ENCOUNTER — Encounter: Payer: Self-pay | Admitting: Family Medicine

## 2016-05-13 LAB — HM DIABETES EYE EXAM

## 2016-05-17 ENCOUNTER — Encounter: Payer: Self-pay | Admitting: Family Medicine

## 2016-05-17 ENCOUNTER — Ambulatory Visit (INDEPENDENT_AMBULATORY_CARE_PROVIDER_SITE_OTHER): Payer: Medicare Other | Admitting: Family Medicine

## 2016-05-17 VITALS — BP 128/70 | HR 98 | Temp 98.8°F | Resp 16 | Ht 63.0 in | Wt 162.0 lb

## 2016-05-17 DIAGNOSIS — J0100 Acute maxillary sinusitis, unspecified: Secondary | ICD-10-CM | POA: Diagnosis not present

## 2016-05-17 DIAGNOSIS — J4520 Mild intermittent asthma, uncomplicated: Secondary | ICD-10-CM | POA: Diagnosis not present

## 2016-05-17 MED ORDER — ALBUTEROL SULFATE HFA 108 (90 BASE) MCG/ACT IN AERS: 2.0000 | INHALATION_SPRAY | Freq: Four times a day (QID) | RESPIRATORY_TRACT | 0 refills | Status: DC | PRN

## 2016-05-17 MED ORDER — AMOXICILLIN-POT CLAVULANATE 875-125 MG PO TABS: 1.0000 | ORAL_TABLET | Freq: Two times a day (BID) | ORAL | 0 refills | Status: DC

## 2016-05-17 NOTE — Patient Instructions (Addendum)
Robitussin DM Take augmentin antibiotics  Albuterol inhaler  F/U as neede

## 2016-05-17 NOTE — Progress Notes (Signed)
   Subjective:    Patient ID: Jennifer Porter, female    DOB: 06-13-1945, 71 y.o.   MRN: [id number]  Patient presents for Illness (x3 days- sinus pressure, facial pain, HA, ear pain, nonproductive cough)  Patient here with sinus pressure/ drainage with headache and nonproductive cough for the past few days. She has not had any fever. A little wheezing last night. She does have history of underlying asthma and allergies is also on treatment for diabetes mellitus. Has been using flonase and ibuprofen, no cough medicine at home +sick contacts, husband had flu like symptoms but not diagnosed   Review Of Systems:  GEN- denies fatigue, fever, weight loss,weakness, recent illness HEENT- denies eye drainage, change in vision, +nasal discharge, CVS- denies chest pain, palpitations RESP- denies SOB,+ cough, wheeze ABD- denies N/V, change in stools, abd pain GU- denies dysuria, hematuria, dribbling, incontinence MSK- denies joint pain, muscle aches, injury Neuro- denies headache, dizziness, syncope, seizure activity       Objective:    BP 128/70   Pulse 98   Temp 98.8 F (37.1 C) (Oral)   Resp 16   Ht 5\' 3"  (1.6 m)   Wt 162 lb (73.5 kg)   SpO2 98%   BMI 28.70 kg/m  GEN- NAD, alert and oriented x3 HEENT- PERRL, EOMI, non injected sclera, pink conjunctiva, MMM, oropharynx mild injection, TM clear bilat no effusion,  + maxillary sinus tenderness bilat , inflammed turbinates,  +Nasal drainage  Neck- Supple, no LAD CVS- RRR, no murmur RESP-CTAB, harsh cough  EXT- No edema Pulses- Radial 2+         Assessment & Plan:      Problem List Items Addressed This Visit    Asthma   Relevant Medications   albuterol (PROVENTIL HFA;VENTOLIN HFA) 108 (90 Base) MCG/ACT inhaler    Other Visit Diagnoses    Acute maxillary sinusitis, recurrence not specified    -  Primary   history of sinusitis in setting of allergies, drainage is causing some bronchial irritation that may be setting off her  asthma. Treat augmentin, albuterol, robiussin DM and she has some tussionex at home she will try Nasal saline, alternating with flonase for the drainage   Relevant Medications   amoxicillin-clavulanate (AUGMENTIN) 875-125 MG tablet      Note: This dictation was prepared with Dragon dictation along with smaller phrase technology. Any transcriptional errors that result from this process are unintentional.

## 2016-05-23 ENCOUNTER — Encounter: Payer: Self-pay | Admitting: Family Medicine

## 2016-05-25 ENCOUNTER — Telehealth: Payer: Self-pay

## 2016-05-25 NOTE — Telephone Encounter (Signed)
Called patient to go over bone density results and let her know that her bone density was normal

## 2016-06-27 ENCOUNTER — Ambulatory Visit (INDEPENDENT_AMBULATORY_CARE_PROVIDER_SITE_OTHER): Payer: Medicare Other | Admitting: Physician Assistant

## 2016-06-27 ENCOUNTER — Encounter: Payer: Self-pay | Admitting: Physician Assistant

## 2016-06-27 VITALS — BP 126/74 | HR 87 | Temp 97.8°F | Resp 14 | Wt 161.4 lb

## 2016-06-27 DIAGNOSIS — E559 Vitamin D deficiency, unspecified: Secondary | ICD-10-CM | POA: Diagnosis not present

## 2016-06-27 DIAGNOSIS — I10 Essential (primary) hypertension: Secondary | ICD-10-CM | POA: Diagnosis not present

## 2016-06-27 DIAGNOSIS — K219 Gastro-esophageal reflux disease without esophagitis: Secondary | ICD-10-CM | POA: Diagnosis not present

## 2016-06-27 DIAGNOSIS — E785 Hyperlipidemia, unspecified: Secondary | ICD-10-CM | POA: Diagnosis not present

## 2016-06-27 DIAGNOSIS — J4520 Mild intermittent asthma, uncomplicated: Secondary | ICD-10-CM | POA: Diagnosis not present

## 2016-06-27 DIAGNOSIS — E119 Type 2 diabetes mellitus without complications: Secondary | ICD-10-CM

## 2016-06-27 MED ORDER — OLOPATADINE HCL 0.2 % OP SOLN: 1.0000 [drp] | Freq: Every day | OPHTHALMIC | 2 refills | Status: DC | PRN

## 2016-06-27 NOTE — Progress Notes (Signed)
Patient ID: Jennifer Porter MRN: [id number], DOB: 1946/03/18, 71 y.o. Date of Encounter: @DATE @  Chief Complaint:  Chief Complaint  Patient presents with  . Hypertension    41month f/u  . Hyperlipidemia    HPI: 71 y.o. year old female  presents for ROV.   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  At multiple prior OVs  she said that she and her husband were busy with --planting their garden.   At f/u OV she said she often has been volunteering several days at the school helping out. Continues to be active outside in the yard.  As well, at office visit 11/2013 she says that she has continued to do her walking--been walking 15-20 minutes once a day either in the morning or the evening depending on the weather weather etc.  As well she has told me in the past that she is careful with her diet. Says that she has never really been one to snack or munch and tries to watch what she eats for meals.  Diabetes: She is taking her metformin as directed. Also eats low carbohydrate diet. Also stays active and walks on a regular basis. She states that her peripheral neuropathy symptoms are controlled with current dose of gabapentin. Hypertension: She is taking her lisinopril as directed. No lightheadedness or other adverse effects. GERD: She states that she does not have to take her Prilosec every day but only as needed for acid reflux/heartburn symptoms. Vitamin D deficiency: She states that she is taking vitamin D as directed at last lab results note. Low Magnesium--At Lab 03/2016--this was low--had her start daily Mg supplement. She is taking this daily now.  At [place] 03/2016 she had c/o diarrhea--at OV 06/27/2016--says this is better--taking probiotic daily.  Past Medical History:  Diagnosis Date  . Allergy    seasonal  . Asthma   . Diabetes mellitus without complication ([place])   . GERD (gastroesophageal reflux disease)    hiatal hernia  . H/O: gout   .  Hyperlipidemia   . Hypertension      Home Meds:  Outpatient Medications Prior to Visit  Medication Sig Dispense Refill  . albuterol (PROVENTIL HFA;VENTOLIN HFA) 108 (90 Base) MCG/ACT inhaler Inhale 2 puffs into the lungs every 6 (six) hours as needed for wheezing or shortness of breath. 1 Inhaler 0  . aspirin 81 MG tablet Take 81 mg by mouth daily.    . calcium carbonate (OS-CAL) 600 MG TABS tablet Take 600 mg by mouth daily with breakfast.    . cholecalciferol (VITAMIN D) 1000 units tablet Take 1,000 Units by mouth daily.    . Choline Fenofibrate (FENOFIBRIC ACID) 135 MG CPDR TAKE ONE CAPSULE BY MOUTH EVERY NIGHT ATBEDTIME 90 capsule 0  . Diclofenac Sodium CR 100 MG 24 hr tablet TAKE ONE (1) TABLET EACH DAY (Patient taking differently: TAKE ONE (1) TABLET EACH DAY as needed) 30 tablet 2  . fluticasone (FLONASE) 50 MCG/ACT nasal spray Place 2 sprays into the nose daily as needed.    . gabapentin (NEURONTIN) 300 MG capsule TAKE ONE (1) CAPSULE THREE (3) TIMES EACH DAY 90 capsule 1  . lisinopril (PRINIVIL,ZESTRIL) 20 MG tablet TAKE ONE (1) TABLET BY MOUTH EVERY DAY 90 tablet 1  . Magnesium Oxide -Mg Supplement 400 MG CAPS Take 1 capsule by mouth daily. 30 capsule 5  . metFORMIN (GLUCOPHAGE) 500 MG tablet TAKE ONE TABLET TWICE DAILY WITH A MEAL 180 tablet 1  .  Multiple Vitamin (MULTIVITAMIN) tablet Take 1 tablet by mouth daily.    Marland Kitchen omeprazole (PRILOSEC) 20 MG capsule Take 1 capsule (20 mg total) by mouth daily. (Patient taking differently: Take 20 mg by mouth as needed. ) 30 capsule 6  . PREMARIN 0.625 MG tablet TAKE ONE (1) TABLET EACH DAY 90 tablet 1  . amoxicillin-clavulanate (AUGMENTIN) 875-125 MG tablet Take 1 tablet by mouth 2 (two) times daily. 20 tablet 0   No facility-administered medications prior to visit.      Allergies:  Allergies  Allergen Reactions  . Codeine Nausea And Vomiting  . Macrobid [Nitrofurantoin] Hives    Social History   Social History  . Marital status:  Single    Spouse name: N/A  . Number of children: N/A  . Years of education: N/A   Occupational History  . Not on file.   Social History Main Topics  . Smoking status: Never Smoker  . Smokeless tobacco: Never Used  . Alcohol use No  . Drug use: No  . Sexual activity: Not on file   Other Topics Concern  . Not on file   Social History Narrative   Retired March 2014.   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 Stroke Mother   . Diabetes Brother   . Cancer Neg Hx   . Heart disease Neg Hx      Review of Systems:  See HPI for pertinent ROS. All other ROS negative.    Physical Exam: Blood pressure 126/74, pulse 87, temperature 97.8 F (36.6 C), temperature source Oral, resp. rate 14, weight 161 lb 6.4 oz (73.2 kg), SpO2 97 %., Body mass index is 28.59 kg/m. General: Obese WF. Appears in no acute distress. Neck: Supple. No thyromegaly. No lymphadenopathy. No carotid bruits. Lungs: Clear bilaterally to auscultation without wheezes, rales, or rhonchi. Breathing is unlabored. Heart: RRR with S1 S2. No murmurs, rubs, or gallops. Abdomen: Soft, non-tender, non-distended with normoactive bowel sounds. No hepatomegaly. No rebound/guarding. No obvious abdominal masses. Musculoskeletal:  Strength and tone normal for age. Extremities/Skin: Warm and dry. No clubbing or cyanosis. No edema. No rashes or suspicious lesions. Neuro: Alert and oriented X 3. Moves all extremities spontaneously. Gait is normal. CNII-XII grossly in tact. Psych:  Responds to questions appropriately with a normal affect. Diabetic Foot exam: Inspection is normal. Sensation is normal and intact.  1+ - 2+ DP pulses bilaterally. No palpable PT  pulses bilaterally.      ASSESSMENT AND PLAN:  71 y.o. year old female with     Hypomagnesemia At lab 03/2016 Magnesium was low. At that time started Mg 400mg  QD. Recheck level now - Magnesium  Diabetes mellitus  without complication  Check K4Y now. Check Micro Albumin  She is on ACE inhibitor. Lisinopril 20 mg daily. She is on no statin. However last 2  LDLs have been 68 and 76-- which is goal. She is on aspirin 81 mg daily.  She has Eye Exam at [facility] She is educated regarding proper foot care  2. Hypertension Blood Pressure at goal/controlled. Cont current med. Check lab to monitor. On ACE Inh.  3. Hyperlipidemia On no Statin. LDL in 70s.  Vitamin D Deficiency She is on Vit D supplement and has had labs to monitor  Left knee pain After office visit 06/2014 she saw Dr. Lyla Glassing at [facility]. Patient states that she was told she has "no cartilage and bone on bone"  in that left knee. Says that she was treated with injection,  Exercises, wedges in in her shoes. Was told that the alternative would be a total knee replacement. She does not want to undergo this surgery unless absolutely necessary. Also does not want to have to do repeat injections unless absolutely necessary. This is now stable.  GERD (gastroesophageal reflux disease) Symptoms controlled with current medication. Mg and B12 levels checked 09/23/2015 Mg was low 03/2016--added daily supplement. Recheck magnesium level now.  Allergy Stable.   Asthma No recent flares.   H/O: gout No Recent flare.  Hormone replacement therapy: We have discussed risk multiple times including cardiovascular risk of blood clots risk and risk of breast cancer and uterine cancer. She is aware of risk. Has opted to continue the medication. Without the medication she has severe hot flashes and other symptoms.  Has history of hysterectomy. This was not performed secondary to any cancer. Therefore she does not need repeat Pap smear. At visit 09/2014 she refuses/defers pelvic exam. Says that she has no uterus and has no ovaries and a half nothing there to examine and does not want to go through that.  Mammogram:  05/2016--negative  DEXA.:  August 2011: Normal   At visit 06/2014 discussed the fact that it is been 5 years since last bone density test and things could have changed. However, she defers rechecking at this time. She is on HRT which   decreases her risk for developing osteoporosis. Will rediscuss in future. Rediscussed at [place] 09/2014--still defers.  DEXA: 05/03/2016: Normal ---Cont Calcium, Vit D.  --- Last vitamin D level was checked 11/28/2012. Level was normal at 45. See above Regarding Vitamin D Deficiency 09/2014, 03/2015  Colonoscopy: July 2008. Repeat 10 years.  Discussed the colonoscopy due this summer 2018  Immunizations: Pneumonia vaccine:              --Pneumovax was given here in November 2012.  -- Prevnar 13 given here 06/06/2013 Zostavax:  Received 07/02/2014 Flu vaccine--Given here 03/12/2014-03/28/2016: States that she received this at [facility] Tetanus: Will not give as she has Medicare  Routine office visit 3 months. Sooner if needed.  Signed, [address] [place], [place], [facility] 06/27/2016 10:30 AM

## 2016-06-28 LAB — HEMOGLOBIN A1C
HEMOGLOBIN A1C: 6 % — AB (ref <5.7)
MEAN PLASMA GLUCOSE: 126 mg/dL

## 2016-06-28 LAB — MICROALBUMIN, URINE: MICROALB UR: 3.7 mg/dL

## 2016-06-28 LAB — MAGNESIUM: MAGNESIUM: 1.2 mg/dL — AB (ref 1.5–2.5)

## 2016-06-29 ENCOUNTER — Other Ambulatory Visit: Payer: Self-pay

## 2016-06-29 MED ORDER — MAGNESIUM OXIDE -MG SUPPLEMENT 400 MG PO CAPS: 2.0000 | ORAL_CAPSULE | Freq: Every day | ORAL | 5 refills | Status: DC

## 2016-07-11 ENCOUNTER — Other Ambulatory Visit: Payer: Self-pay | Admitting: Physician Assistant

## 2016-07-19 ENCOUNTER — Other Ambulatory Visit: Payer: Medicare Other

## 2016-07-20 ENCOUNTER — Other Ambulatory Visit: Payer: Self-pay

## 2016-07-20 LAB — MAGNESIUM: MAGNESIUM: 1.2 mg/dL — AB (ref 1.5–2.5)

## 2016-07-20 MED ORDER — RANITIDINE HCL 150 MG PO TABS: 150.0000 mg | ORAL_TABLET | Freq: Every day | ORAL | 2 refills | Status: DC

## 2016-07-21 ENCOUNTER — Other Ambulatory Visit: Payer: Self-pay

## 2016-07-21 MED ORDER — RANITIDINE HCL 150 MG PO TABS: 150.0000 mg | ORAL_TABLET | Freq: Every day | ORAL | 2 refills | Status: DC

## 2016-07-21 NOTE — Telephone Encounter (Signed)
Dispense amount  on Ranitidine needed to be changed from 1 to 30. There was an error

## 2016-08-02 ENCOUNTER — Other Ambulatory Visit: Payer: Self-pay | Admitting: Physician Assistant

## 2016-08-02 NOTE — Telephone Encounter (Signed)
Refill appropriate 

## 2016-08-29 ENCOUNTER — Other Ambulatory Visit: Payer: Self-pay | Admitting: Physician Assistant

## 2016-08-29 NOTE — Telephone Encounter (Signed)
Refill appropriate 

## 2016-10-07 ENCOUNTER — Other Ambulatory Visit: Payer: Self-pay | Admitting: Physician Assistant

## 2016-10-25 ENCOUNTER — Other Ambulatory Visit: Payer: Self-pay | Admitting: Physician Assistant

## 2016-10-25 NOTE — Telephone Encounter (Signed)
refill appropriate  

## 2016-10-31 ENCOUNTER — Other Ambulatory Visit: Payer: Self-pay | Admitting: Physician Assistant

## 2016-10-31 NOTE — Telephone Encounter (Signed)
Refill appropriate 

## 2016-11-23 ENCOUNTER — Other Ambulatory Visit: Payer: Self-pay | Admitting: Physician Assistant

## 2017-01-06 ENCOUNTER — Other Ambulatory Visit: Payer: Self-pay | Admitting: Physician Assistant

## 2017-01-12 ENCOUNTER — Encounter: Payer: Self-pay | Admitting: Physician Assistant

## 2017-01-12 ENCOUNTER — Ambulatory Visit (INDEPENDENT_AMBULATORY_CARE_PROVIDER_SITE_OTHER): Payer: Medicare Other | Admitting: Physician Assistant

## 2017-01-12 VITALS — BP 120/82 | HR 78 | Temp 97.7°F | Resp 16 | Ht 63.0 in | Wt 155.4 lb

## 2017-01-12 DIAGNOSIS — N951 Menopausal and female climacteric states: Secondary | ICD-10-CM | POA: Diagnosis not present

## 2017-01-12 DIAGNOSIS — Z1159 Encounter for screening for other viral diseases: Secondary | ICD-10-CM | POA: Diagnosis not present

## 2017-01-12 DIAGNOSIS — K219 Gastro-esophageal reflux disease without esophagitis: Secondary | ICD-10-CM | POA: Diagnosis not present

## 2017-01-12 DIAGNOSIS — I10 Essential (primary) hypertension: Secondary | ICD-10-CM

## 2017-01-12 DIAGNOSIS — E119 Type 2 diabetes mellitus without complications: Secondary | ICD-10-CM

## 2017-01-12 DIAGNOSIS — J4520 Mild intermittent asthma, uncomplicated: Secondary | ICD-10-CM

## 2017-01-12 DIAGNOSIS — E559 Vitamin D deficiency, unspecified: Secondary | ICD-10-CM | POA: Diagnosis not present

## 2017-01-12 DIAGNOSIS — Z23 Encounter for immunization: Secondary | ICD-10-CM | POA: Diagnosis not present

## 2017-01-12 DIAGNOSIS — Z79890 Hormone replacement therapy (postmenopausal): Secondary | ICD-10-CM | POA: Diagnosis not present

## 2017-01-12 NOTE — Progress Notes (Signed)
Patient ID: Jennifer Porter MRN: [id number], DOB: 01-Nov-1945, 71 y.o. Date of Encounter: @DATE @  Chief Complaint:  Chief Complaint  Patient presents with  . 6 month follow up  . Back Pain    HPI: 71 y.o. year old female  presents for ROV.   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  At multiple prior OVs  she said that she and her husband were busy with --planting their garden.   At f/u OV she said she often has been volunteering several days at the school helping out. Continues to be active outside in the yard.  As well, at office visit 11/2013 she says that she has continued to do her walking--been walking 15-20 minutes once a day either in the morning or the evening depending on the weather weather etc.  As well she has told me in the past that she is careful with her diet. Says that she has never really been one to snack or munch and tries to watch what she eats for meals.  Diabetes: She is taking her metformin as directed. Also eats low carbohydrate diet. Also stays active and walks on a regular basis. She states that her peripheral neuropathy symptoms are controlled with current dose of gabapentin. Hypertension: She is taking her lisinopril as directed. No lightheadedness or other adverse effects. GERD: She states that she does not have to take her Prilosec every day but only as needed for acid reflux/heartburn symptoms. Vitamin D deficiency: She states that she is taking vitamin D as directed at last lab results note. Low Magnesium--At Lab 03/2016--this was low--had her start daily Mg supplement. She is taking this daily now.  At [place] 03/2016 she had c/o diarrhea--at OV 06/27/2016--says this is better--taking probiotic daily. Hormone replacement therapy: We have discussed risk multiple times including cardiovascular risk of blood clots risk and risk of breast cancer and uterine cancer. She is aware of risk. Has opted to continue the medication.  Without the medication she has severe hot flashes and other symptoms. H/O Gout: She has had no recent flare in past year At [place] 01/12/2017: she also reports the following: Reports that about 3 weeks ago her nose started bleeding. She had held her hand up to her nose to catch the blood etc. but was so focused on that and trying to get into the bathroom that she fell on her way into the bathroom. Says that she actually fell forward but since then has been feeling some discomfort in her left low back. Thinks that she "must have just thrown something out of whack" says that if she moves a certain way she feels pain. Her husband has problems with his kidneys so she is very careful and does not want to damage her kidneys. Therefore his been taking her diclofenac only one every other day. This does keep the pain bearable. Just wanted to let me know about it since she is still feeling the discomfort and it has been 3 weeks. At this time she does not want x-rays or any further evaluation but will follow-up if it worsens. He has had no pain no numbness no tingling no weakness down either of her legs. No saddle anesthesia. No incontinence of bowel or bladder.   Past Medical History:  Diagnosis Date  . Allergy    seasonal  . Asthma   . Diabetes mellitus without complication ([place])   . GERD (gastroesophageal reflux disease)    hiatal hernia  .  H/O: gout   . Hyperlipidemia   . Hypertension      Home Meds:  Outpatient Medications Prior to Visit  Medication Sig Dispense Refill  . albuterol (PROVENTIL HFA;VENTOLIN HFA) 108 (90 Base) MCG/ACT inhaler Inhale 2 puffs into the lungs every 6 (six) hours as needed for wheezing or shortness of breath. 1 Inhaler 0  . aspirin 81 MG tablet Take 81 mg by mouth daily.    . calcium carbonate (OS-CAL) 600 MG TABS tablet Take 600 mg by mouth daily with breakfast.    . cholecalciferol (VITAMIN D) 1000 units tablet Take 1,000 Units by mouth daily.    . Choline Fenofibrate 135  MG capsule TAKE ONE CAPSULE EVERY NIGHT AT BEDTIME 90 capsule 0  . Diclofenac Sodium CR 100 MG 24 hr tablet TAKE ONE (1) TABLET EACH DAY 30 tablet 1  . fluticasone (FLONASE) 50 MCG/ACT nasal spray Place 2 sprays into the nose daily as needed.    . gabapentin (NEURONTIN) 300 MG capsule TAKE ONE (1) CAPSULE THREE (3) TIMES EACH DAY 90 capsule 0  . lisinopril (PRINIVIL,ZESTRIL) 20 MG tablet TAKE ONE (1) TABLET EACH DAY 90 tablet 0  . Magnesium Oxide -Mg Supplement 400 MG CAPS Take 2 capsules by mouth daily. 30 capsule 5  . metFORMIN (GLUCOPHAGE) 500 MG tablet TAKE ONE TABLET TWICE DAILY WITH A MEAL 180 tablet 1  . Multiple Vitamin (MULTIVITAMIN) tablet Take 1 tablet by mouth daily.    . Olopatadine HCl 0.2 % SOLN Apply 1 drop to eye daily as needed. 5 mL 2  . PREMARIN 0.625 MG tablet TAKE ONE (1) TABLET EACH DAY 90 tablet 0  . ranitidine (ZANTAC) 150 MG tablet Take 1 tablet (150 mg total) by mouth daily. 30 tablet 2   No facility-administered medications prior to visit.      Allergies:  Allergies  Allergen Reactions  . Codeine Nausea And Vomiting  . Macrobid [Nitrofurantoin] Hives    Social History   Social History  . Marital status: Single    Spouse name: N/A  . Number of children: N/A  . Years of education: N/A   Occupational History  . Not on file.   Social History Main Topics  . Smoking status: Never Smoker  . Smokeless tobacco: Never Used  . Alcohol use No  . Drug use: No  . Sexual activity: Not on file   Other Topics Concern  . Not on file   Social History Narrative   Retired March 2014.   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 Stroke Mother   . Diabetes Brother   . Cancer Neg Hx   . Heart disease Neg Hx      Review of Systems:  See HPI for pertinent ROS. All other ROS negative.    Physical Exam: Blood pressure 120/82, pulse 78, temperature 97.7 F (36.5 C), temperature source Oral, resp. rate  16, height 5\' 3"  (1.6 m), weight 70.5 kg (155 lb 6.4 oz), SpO2 98 %., There is no height or weight on file to calculate BMI. General: Obese WF. Appears in no acute distress. Neck: Supple. No thyromegaly. No lymphadenopathy. No carotid bruits. Lungs: Clear bilaterally to auscultation without wheezes, rales, or rhonchi. Breathing is unlabored. Heart: RRR with S1 S2. No murmurs, rubs, or gallops. Abdomen: Soft, non-tender, non-distended with normoactive bowel sounds. No hepatomegaly. No rebound/guarding. No obvious abdominal masses. Musculoskeletal:  Strength and tone normal for age. Minimal  tenderness with palpation to the left low back at about L4-L5 level. Extremities/Skin: Warm and dry.No LE edema. Neuro: Alert and oriented X 3. Moves all extremities spontaneously. Gait is normal. CNII-XII grossly in tact. Psych:  Responds to questions appropriately with a normal affect. Diabetic Foot exam: Inspection is normal. Sensation is normal and intact.  1+ - 2+ DP pulses bilaterally. No palpable PT  pulses bilaterally.      ASSESSMENT AND PLAN:  71 y.o. year old female with   Hypomagnesemia 01/12/2017:At lab 03/2016 Magnesium was low. At that time started Mg 400mg  QD. Recheck level now - Magnesium  Diabetes mellitus without complication  87/56/4332:RJJOA A1C now. Checked Micro Albumin 06/2016  She is on ACE inhibitor. Lisinopril 20 mg daily. She is on no statin. However last 2  LDLs have been 68 and 76-- which is goal. She is on aspirin 81 mg daily.  She has Eye Exam at [facility] She is educated regarding proper foot care  2. Hypertension 01/12/2017:Blood Pressure at goal/controlled. Cont current med. Check lab to monitor. On ACE Inh.  3. Hyperlipidemia 01/12/2017:On no Statin. LDL in 70s.  Vitamin D Deficiency 01/12/2017:She is on Vit D supplement and has had labs to monitor  Left knee pain After office visit 06/2014 she saw Dr. Lyla Glassing at [facility]. Patient states  that she was told she has "no cartilage and bone on bone" in that left knee. Says that she was treated with injection,  Exercises, wedges in in her shoes. Was told that the alternative would be a total knee replacement. She does not want to undergo this surgery unless absolutely necessary. Also does not want to have to do repeat injections unless absolutely necessary. 01/12/2017:This is now stable.  GERD (gastroesophageal reflux disease) 01/12/2017:Symptoms controlled with current medication. Mg and B12 levels checked 09/23/2015 01/12/2017:Mg was low 03/2016--added daily supplement. Recheck magnesium level now.  Allergy Stable.   Asthma 01/12/2017:No recent flares.   H/O: gout 01/12/2017:No Recent flare.  Hormone replacement therapy: 01/12/2017:We have discussed risk multiple times including cardiovascular risk of blood clots risk and risk of breast cancer and uterine cancer. She is aware of risk. Has opted to continue the medication. Without the medication she has severe hot flashes and other symptoms.  Has history of hysterectomy. This was not performed secondary to any cancer. Therefore she does not need repeat Pap smear. At visit 09/2014 she refuses/defers pelvic exam. Says that she has no uterus and has no ovaries and a half nothing there to examine and does not want to go through that.  Mammogram:  05/2016--negative  DEXA.: August 2011: Normal   At visit 06/2014 discussed the fact that it is been 5 years since last bone density test and things could have changed. However, she defers rechecking at this time. She is on HRT which   decreases her risk for developing osteoporosis. Will rediscuss in future. Rediscussed at [place] 09/2014--still defers.  DEXA: 05/03/2016: Normal ---Cont Calcium, Vit D.  --- Last vitamin D level was checked 11/28/2012. Level was normal at 45. See above Regarding Vitamin D Deficiency 09/2014, 03/2015  Colonoscopy: July 2008. Repeat 10 years.  Discussed the colonoscopy  due this summer 2018 01/12/2017: Discussed that her colonoscopy is due. She does not want repeat colonoscopy. She is age 47 and does not feel that risk versus benefit make sense for her to do another colonoscopy. I then discussed Hemoccult and cologuard. I had planned to just go ahead and give her the cologuard information today but  she told staff that she wanted to think about it and will let us know if she wants to pick it up. We did have conversation today about risk of procedures versus benefits and benefits of knowing that you have an early cancer. She will think this over further and let us know.  Immunizations: Pneumonia vaccine:              --Pneumovax was given here in November 2012.  -- Prevnar 13 given here 06/06/2013 Zostavax:  Received 07/02/2014 Flu vaccine--Given here 03/12/2014-01/12/2017 Tetanus: Will not give as she has Medicare  Routine office visit 3 months. Sooner if needed.  Signed, [address] [place], [place], [facility] 01/12/2017 8:11 AM

## 2017-01-13 LAB — COMPLETE METABOLIC PANEL WITH GFR
AG RATIO: 1.7 (calc) (ref 1.0–2.5)
ALT: 23 U/L (ref 6–29)
AST: 29 U/L (ref 10–35)
Albumin: 4.3 g/dL (ref 3.6–5.1)
Alkaline phosphatase (APISO): 17 U/L — ABNORMAL LOW (ref 33–130)
BUN: 20 mg/dL (ref 7–25)
CALCIUM: 9.5 mg/dL (ref 8.6–10.4)
CO2: 26 mmol/L (ref 20–32)
Chloride: 102 mmol/L (ref 98–110)
Creat: 0.78 mg/dL (ref 0.60–0.93)
GFR, EST AFRICAN AMERICAN: 88 mL/min/{1.73_m2} (ref >=60)
GFR, EST NON AFRICAN AMERICAN: 76 mL/min/{1.73_m2} (ref >=60)
Globulin: 2.5 g/dL (calc) (ref 1.9–3.7)
Glucose, Bld: 101 mg/dL — ABNORMAL HIGH (ref 65–99)
Potassium: 4.3 mmol/L (ref 3.5–5.3)
Sodium: 139 mmol/L (ref 135–146)
TOTAL PROTEIN: 6.8 g/dL (ref 6.1–8.1)
Total Bilirubin: 0.4 mg/dL (ref 0.2–1.2)

## 2017-01-13 LAB — HEPATITIS C ANTIBODY
HEP C AB: NONREACTIVE
SIGNAL TO CUT-OFF: 0.02 (ref <1.00)

## 2017-01-13 LAB — VITAMIN D 25 HYDROXY (VIT D DEFICIENCY, FRACTURES): Vit D, 25-Hydroxy: 34 ng/mL (ref 30–100)

## 2017-01-13 LAB — LIPID PANEL
CHOL/HDL RATIO: 3.4 (calc) (ref <5.0)
Cholesterol: 175 mg/dL (ref <200)
HDL: 52 mg/dL (ref >50)
LDL Cholesterol (Calc): 97 mg/dL (calc)
NON-HDL CHOLESTEROL (CALC): 123 mg/dL (ref <130)
TRIGLYCERIDES: 157 mg/dL — AB (ref <150)

## 2017-01-13 LAB — HEMOGLOBIN A1C
HEMOGLOBIN A1C: 5.9 %{Hb} — AB (ref <5.7)
MEAN PLASMA GLUCOSE: 123 (calc)
eAG (mmol/L): 6.8 (calc)

## 2017-01-13 LAB — MAGNESIUM: Magnesium: 1.6 mg/dL (ref 1.5–2.5)

## 2017-01-19 ENCOUNTER — Other Ambulatory Visit: Payer: Self-pay | Admitting: Physician Assistant

## 2017-01-20 NOTE — Telephone Encounter (Signed)
Refill appropriate 

## 2017-01-24 ENCOUNTER — Other Ambulatory Visit: Payer: Self-pay | Admitting: Physician Assistant

## 2017-04-10 ENCOUNTER — Other Ambulatory Visit: Payer: Self-pay | Admitting: Physician Assistant

## 2017-04-10 NOTE — Telephone Encounter (Signed)
Refill appropriate 

## 2017-04-17 ENCOUNTER — Encounter: Payer: Self-pay | Admitting: Physician Assistant

## 2017-04-17 ENCOUNTER — Ambulatory Visit (INDEPENDENT_AMBULATORY_CARE_PROVIDER_SITE_OTHER): Payer: Medicare Other | Admitting: Physician Assistant

## 2017-04-17 ENCOUNTER — Other Ambulatory Visit: Payer: Self-pay

## 2017-04-17 VITALS — BP 126/74 | HR 86 | Temp 97.6°F | Resp 14 | Ht 63.0 in | Wt 159.2 lb

## 2017-04-17 DIAGNOSIS — E782 Mixed hyperlipidemia: Secondary | ICD-10-CM

## 2017-04-17 DIAGNOSIS — N951 Menopausal and female climacteric states: Secondary | ICD-10-CM | POA: Diagnosis not present

## 2017-04-17 DIAGNOSIS — E559 Vitamin D deficiency, unspecified: Secondary | ICD-10-CM

## 2017-04-17 DIAGNOSIS — I10 Essential (primary) hypertension: Secondary | ICD-10-CM

## 2017-04-17 DIAGNOSIS — Z79890 Hormone replacement therapy (postmenopausal): Secondary | ICD-10-CM

## 2017-04-17 DIAGNOSIS — E119 Type 2 diabetes mellitus without complications: Secondary | ICD-10-CM | POA: Diagnosis not present

## 2017-04-17 DIAGNOSIS — J4520 Mild intermittent asthma, uncomplicated: Secondary | ICD-10-CM | POA: Diagnosis not present

## 2017-04-17 DIAGNOSIS — K219 Gastro-esophageal reflux disease without esophagitis: Secondary | ICD-10-CM

## 2017-04-17 MED ORDER — SIMVASTATIN 10 MG PO TABS: 10.0000 mg | ORAL_TABLET | Freq: Every evening | ORAL | 3 refills | Status: DC

## 2017-04-17 NOTE — Progress Notes (Signed)
Patient ID: PRINCESS KARNES MRN: [id number], DOB: [birth year], 72 y.o. Date of Encounter: @DATE @  Chief Complaint:  No chief complaint on file.   HPI: 72 y.o. year old female  presents for ROV.   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  At multiple prior OVs  she said that she and her husband were busy with --planting their garden.   At f/u OV she said she often has been volunteering several days at the school helping out. Continues to be active outside in the yard.  As well, at office visit 11/2013 she says that she has continued to do her walking--been walking 15-20 minutes once a day either in the morning or the evening depending on the weather weather etc.  As well she has told me in the past that she is careful with her diet. Says that she has never really been one to snack or munch and tries to watch what she eats for meals.  Diabetes: She is taking her metformin as directed. Also eats low carbohydrate diet. Also stays active and walks on a regular basis. She states that her peripheral neuropathy symptoms are controlled with current dose of gabapentin.  Hypertension: She is taking her lisinopril as directed. No lightheadedness or other adverse effects.  GERD: She states that she does not have to take her Prilosec every day but only as needed for acid reflux/heartburn symptoms.  Vitamin D deficiency: She states that she is taking vitamin D as directed.  Low Magnesium--At Lab 03/2016--this was low--had her start daily Mg supplement. She is taking this daily now.   At [place] 03/2016 she had c/o diarrhea--at OV 06/27/2016--says this is better--taking probiotic daily.  Hormone replacement therapy: We have discussed risk multiple times including cardiovascular risk of blood clots risk and risk of breast cancer and uterine cancer. She is aware of risk. Has opted to continue the medication. Without the medication, she has severe hot flashes and other  symptoms.  H/O Gout: She has had no recent flare in past year  At [place] 04/17/2017 she reports that her knee and back have been giving her some problems.  Especially her knee.  Says that she had one injection to her knee over a year ago.  Says that it helped for a while.  Says that the orthopedic had discussed knee replacement but she really does not want to have to go through knee replacement.  Constant pain there but some times has severe pain--- says that recently 1 day she was at the grocery store and developed severe pain in her knee.  Also is not able to do as much of her walking as she was because of her knee. She also states that her stomach also seems to "bother her in waves also ".  "Some days it is worse, and some times it is better."       Past Medical History:  Diagnosis Date  . Allergy    seasonal  . Asthma   . Diabetes mellitus without complication ([place])   . GERD (gastroesophageal reflux disease)    hiatal hernia  . H/O: gout   . Hyperlipidemia   . Hypertension      Home Meds:  Outpatient Medications Prior to Visit  Medication Sig Dispense Refill  . albuterol (PROVENTIL HFA;VENTOLIN HFA) 108 (90 Base) MCG/ACT inhaler Inhale 2 puffs into the lungs every 6 (six) hours as needed for wheezing or shortness of breath. 1 Inhaler 0  .  aspirin 81 MG tablet Take 81 mg by mouth daily.    . calcium carbonate (OS-CAL) 600 MG TABS tablet Take 600 mg by mouth daily with breakfast.    . cholecalciferol (VITAMIN D) 1000 units tablet Take 1,000 Units by mouth daily.    . Choline Fenofibrate 135 MG capsule TAKE ONE CAPSULE AT BEDTIME 90 capsule 0  . Diclofenac Sodium CR 100 MG 24 hr tablet TAKE ONE (1) TABLET EACH DAY 30 tablet 1  . fluticasone (FLONASE) 50 MCG/ACT nasal spray Place 2 sprays into the nose daily as needed.    . gabapentin (NEURONTIN) 300 MG capsule TAKE ONE (1) CAPSULE THREE (3) TIMES EACH DAY 90 capsule 1  . lisinopril (PRINIVIL,ZESTRIL) 20 MG tablet TAKE ONE (1) TABLET  BY MOUTH EVERY DAY 90 tablet 0  . Magnesium Oxide -Mg Supplement 400 MG CAPS Take 2 capsules by mouth daily. 30 capsule 5  . metFORMIN (GLUCOPHAGE) 500 MG tablet TAKE ONE TABLET TWICE DAILY WITH A MEAL 180 tablet 1  . Multiple Vitamin (MULTIVITAMIN) tablet Take 1 tablet by mouth daily.    . Olopatadine HCl 0.2 % SOLN Apply 1 drop to eye daily as needed. 5 mL 2  . PREMARIN 0.625 MG tablet TAKE ONE (1) TABLET EACH DAY 90 tablet 0  . ranitidine (ZANTAC) 150 MG tablet Take 1 tablet (150 mg total) by mouth daily. 30 tablet 2   No facility-administered medications prior to visit.      Allergies:  Allergies  Allergen Reactions  . Codeine Nausea And Vomiting  . Macrobid [Nitrofurantoin] Hives    Social History   Socioeconomic History  . Marital status: Single    Spouse name: Not on file  . Number of children: Not on file  . Years of education: Not on file  . Highest education level: Not on file  Social Needs  . Financial resource strain: Not on file  . Food insecurity - worry: Not on file  . Food insecurity - inability: Not on file  . Transportation needs - medical: Not on file  . Transportation needs - non-medical: Not on file  Occupational History  . Not on file  Tobacco Use  . Smoking status: Never Smoker  . Smokeless tobacco: Never Used  Substance and Sexual Activity  . Alcohol use: No  . Drug use: No  . Sexual activity: Not on file  Other Topics Concern  . Not on file  Social History Narrative   Retired March 2014.   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 Stroke Mother   . Diabetes Brother   . Cancer Neg Hx   . Heart disease Neg Hx      Review of Systems:  See HPI for pertinent ROS. All other ROS negative.    Physical Exam: Blood pressure 126/74, pulse 86, temperature 97.6 F (36.4 C), temperature source Oral, resp. rate 14, height 5\' 3"  (1.6 m), weight 72.2 kg (159 lb 3.2 oz), SpO2 98 %., There is no  height or weight on file to calculate BMI. General: Obese WF. Appears in no acute distress. Neck: Supple. No thyromegaly. No lymphadenopathy. No carotid bruits. Lungs: Clear bilaterally to auscultation without wheezes, rales, or rhonchi. Breathing is unlabored. Heart: RRR with S1 S2. No murmurs, rubs, or gallops. Abdomen: Soft, non-tender, non-distended with normoactive bowel sounds. No hepatomegaly. No rebound/guarding. No obvious abdominal masses. Musculoskeletal:  Strength and tone normal for age. Minimal tenderness with  palpation to the left low back at about L4-L5 level. Extremities/Skin: Warm and dry.No LE edema. Neuro: Alert and oriented X 3. Moves all extremities spontaneously. Gait is normal. CNII-XII grossly in tact. Psych:  Responds to questions appropriately with a normal affect. Diabetic Foot exam: Inspection is normal. Sensation is normal and intact.  1+ - 2+ DP pulses bilaterally. No palpable PT  pulses bilaterally.      ASSESSMENT AND PLAN:  72 y.o. year old female with   Hypomagnesemia At lab 03/2016 Magnesium was low. At that time started Mg 400mg  QD.  - Magnesium level normal at lab 01/12/2017--cont current dose of Mg  Diabetes mellitus without complication  0/93/2671:IWPYK A1C now. Checked Micro Albumin 06/2016  --------------------04/17/2017: Today I discussed with her adding a low-dose statin.  She is agreeable with this.  Will add simvastatin 10 mg daily.  Reviewed that lipid panel 01/12/17 showed LDL 97.  LFTs were normal.  Recheck LFTs today to make sure that these are normal and if so we will start simvastatin 10 mg.------------------------------------ She is already on fenofibrate for high triglycerides.  Continue this.   She is on ACE inhibitor. Lisinopril 20 mg daily. She is on aspirin 81 mg daily.  She has Eye Exam at [facility] She is educated regarding proper foot care  Hypertension 04/17/2017: Blood Pressure at goal/controlled. Cont current med. Check  lab to monitor. On ACE Inh.  Hyperlipidemia -------------------04/17/2017: Today I discussed with her adding a low-dose statin.  She is agreeable with this.  Will add simvastatin 10 mg daily.  Reviewed that lipid panel 01/12/17 showed LDL 97.  LFTs were normal.  Recheck LFTs today to make sure that these are normal and if so we will start simvastatin 10 mg.------------------------------------ She is already on fenofibrate for high triglycerides.  Continue this.      Vitamin D Deficiency 04/17/2017:She is on Vit D supplement and has had labs to monitor  Left knee pain After office visit 06/2014 she saw Dr. Lyla Glassing at [facility]. Patient states that she was told she has "no cartilage and bone on bone" in that left knee. Says that she was treated with injection,  Exercises, wedges in in her shoes. Was told that the alternative would be a total knee replacement. She does not want to undergo this surgery unless absolutely necessary. Also does not want to have to do repeat injections unless absolutely necessary. 04/17/2017:  I encouraged her to follow-up with orthopedics--- especially if this is limiting her ability to walk and get exercise and especially if it is preventing her from doing ADLs and grocery shopping.  GERD (gastroesophageal reflux disease) 01/12/2017:Symptoms controlled with current medication. Mg and B12 levels checked 09/23/2015 Mg was low 03/2016--added daily supplement.  01/12/2017: Recheck magnesium level normal--cont current dose Mg supplement 04/17/2017: Last magnesium level was within normal limits.  Continue current dose of magnesium.  Allergy Stable.   Asthma 04/17/2017:No recent flares.   H/O: gout 04/17/2017:No Recent flare.  Hormone replacement therapy: We have discussed risk multiple times including cardiovascular risk of blood clots risk and risk of breast cancer and uterine cancer. She is aware of risk. Has opted to continue the medication. Without the  medication she has severe hot flashes and other symptoms.  Has history of hysterectomy. This was not performed secondary to any cancer. Therefore she does not need repeat Pap smear. At visit 09/2014 she refuses/defers pelvic exam. Says that she has no uterus and has no ovaries and a half nothing there to  examine and does not want to go through that.  Mammogram:  05/2016--negative  DEXA.: August 2011: Normal   At visit 06/2014 discussed the fact that it is been 5 years since last bone density test and things could have changed. However, she defers rechecking at this time. She is on HRT which   decreases her risk for developing osteoporosis. Will rediscuss in future. Rediscussed at [place] 09/2014--still defers.  DEXA: 05/03/2016: Normal ---Cont Calcium, Vit D.  --- Last vitamin D level was checked 11/28/2012. Level was normal at 45. See above Regarding Vitamin D Deficiency 09/2014, 03/2015  Colonoscopy: July 2008. Repeat 10 years.  Discussed the colonoscopy due this summer 2018 01/12/2017: Discussed that her colonoscopy is due. She does not want repeat colonoscopy. She is age 3 and does not feel that risk versus benefit make sense for her to do another colonoscopy. I then discussed Hemoccult and cologuard. I had planned to just go ahead and give her the cologuard information today but she told staff that she wanted to think about it and will let us know if she wants to pick it up. We did have conversation today about risk of procedures versus benefits and benefits of knowing that you have an early cancer. She will think this over further and let us know.  Immunizations: Pneumonia vaccine:              --Pneumovax was given here in November 2012.  -- Prevnar 13 given here 06/06/2013 Zostavax:  Received 07/02/2014 Flu vaccine--Given here 03/12/2014-01/12/2017 Tetanus: Will not give as she has Medicare  Routine office visit 3 months. Sooner if needed.  Signed, [address] [place], [place], [facility] 04/17/2017 8:05  AM

## 2017-04-18 LAB — HEPATIC FUNCTION PANEL
AG Ratio: 1.8 (calc) (ref 1.0–2.5)
ALBUMIN MSPROF: 4.3 g/dL (ref 3.6–5.1)
ALT: 14 U/L (ref 6–29)
AST: 17 U/L (ref 10–35)
Alkaline phosphatase (APISO): 17 U/L — ABNORMAL LOW (ref 33–130)
Bilirubin, Direct: 0.1 mg/dL (ref 0.0–0.2)
GLOBULIN: 2.4 g/dL (ref 1.9–3.7)
Indirect Bilirubin: 0.3 mg/dL (calc) (ref 0.2–1.2)
TOTAL PROTEIN: 6.7 g/dL (ref 6.1–8.1)
Total Bilirubin: 0.4 mg/dL (ref 0.2–1.2)

## 2017-04-18 LAB — HEMOGLOBIN A1C
Hgb A1c MFr Bld: 6 % of total Hgb — ABNORMAL HIGH (ref <5.7)
Mean Plasma Glucose: 126 (calc)
eAG (mmol/L): 7 (calc)

## 2017-04-20 ENCOUNTER — Other Ambulatory Visit: Payer: Self-pay | Admitting: Physician Assistant

## 2017-04-21 NOTE — Telephone Encounter (Signed)
Refill appropriate 

## 2017-05-01 ENCOUNTER — Other Ambulatory Visit: Payer: Self-pay | Admitting: Physician Assistant

## 2017-05-01 NOTE — Telephone Encounter (Signed)
Refill appropriate 

## 2017-05-09 DIAGNOSIS — M1712 Unilateral primary osteoarthritis, left knee: Secondary | ICD-10-CM | POA: Diagnosis not present

## 2017-05-26 ENCOUNTER — Other Ambulatory Visit: Payer: Self-pay | Admitting: Physician Assistant

## 2017-07-06 ENCOUNTER — Other Ambulatory Visit: Payer: Self-pay | Admitting: Physician Assistant

## 2017-07-19 ENCOUNTER — Ambulatory Visit: Payer: Medicare Other | Admitting: Physician Assistant

## 2017-07-19 ENCOUNTER — Other Ambulatory Visit: Payer: Self-pay | Admitting: Physician Assistant

## 2017-07-24 ENCOUNTER — Encounter: Payer: Self-pay | Admitting: Physician Assistant

## 2017-07-24 ENCOUNTER — Ambulatory Visit (INDEPENDENT_AMBULATORY_CARE_PROVIDER_SITE_OTHER): Payer: Medicare Other | Admitting: Physician Assistant

## 2017-07-24 ENCOUNTER — Other Ambulatory Visit: Payer: Self-pay

## 2017-07-24 VITALS — BP 128/72 | HR 80 | Temp 97.7°F | Resp 18 | Ht 63.0 in | Wt 164.4 lb

## 2017-07-24 DIAGNOSIS — E559 Vitamin D deficiency, unspecified: Secondary | ICD-10-CM

## 2017-07-24 DIAGNOSIS — E785 Hyperlipidemia, unspecified: Secondary | ICD-10-CM | POA: Diagnosis not present

## 2017-07-24 DIAGNOSIS — K219 Gastro-esophageal reflux disease without esophagitis: Secondary | ICD-10-CM

## 2017-07-24 DIAGNOSIS — E119 Type 2 diabetes mellitus without complications: Secondary | ICD-10-CM

## 2017-07-24 DIAGNOSIS — I10 Essential (primary) hypertension: Secondary | ICD-10-CM

## 2017-07-24 NOTE — Progress Notes (Signed)
Patient ID: Jennifer Porter MRN: [id number], DOB: February 06, 1946, 72 y.o. Date of Encounter: @DATE @  Chief Complaint:  Chief Complaint  Patient presents with  . follow up with diabetes    HPI: 72 y.o. year old female  presents for ROV.   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  At multiple prior OVs  she said that she and her husband were busy with --planting their garden.   At f/u OV she said she often has been volunteering several days at the school helping out. Continues to be active outside in the yard.  As well, at office visit 11/2013 she says that she has continued to do her walking--been walking 15-20 minutes once a day either in the morning or the evening depending on the weather weather etc.  As well she has told me in the past that she is careful with her diet. Says that she has never really been one to snack or munch and tries to watch what she eats for meals.  At [place] 07/24/2017--she reports that she has been having problems with her knee.  States that she had an injection and is scheduled for another injection in a week or so.  Is seeing Dr. Lyla Glassing for this. States that she has had to decrease her walking because of this knee pain.  Is walking every day for exercise but now only doing this about 3 days a week and not walking as far.  States that she walks down the road and back.  Diabetes:  07/24/2017: She is taking her metformin as directed. Also eats low carbohydrate diet. Also stays active and walks on a regular basis.  At visit 07/24/2017 she states she has had to decrease the amount of walking secondary to left knee pain.  See above regarding that. She states that her peripheral neuropathy symptoms are controlled with current dose of gabapentin.  Hypertension:  07/24/2017:She is taking her lisinopril as directed. No lightheadedness or other adverse effects.  GERD:  07/24/2017:She states that she does not have to take her Prilosec every day  but only as needed for acid reflux/heartburn symptoms.  Vitamin D deficiency:  07/24/2017:She states that she is taking vitamin D as directed.  Low Magnesium--At Lab 03/2016--this was low--had her start daily Mg supplement. She is taking this daily now.  07/24/2017: Continues to take daily magnesium supplement.   Hormone replacement therapy: We have discussed risk multiple times including cardiovascular risk of blood clots risk and risk of breast cancer and uterine cancer. She is aware of risk. Has opted to continue the medication. Without the medication, she has severe hot flashes and other symptoms. 07/24/2017: Will continue current dose of HRT.  H/O Gout:  07/24/2017:She has had no recent flare in past year  Hyperlipidemia: 07/24/2017: She states that she was taking the simvastatin each evening but she quit taking it just over the past 1 week.  States that she would take the simvastatin when she ate her dinner and then a little later would develop a headache.  The headache was secondary to the simvastatin.  Therefore stopped taking the simvastatin about a week ago.  Adds that she does have a lot of problems with allergies and pollen and that it may all be a sinus headache.  Discussed with her that any medicine can cause any side effect for different people but that headache is not a common side effect of statin.  He states that she will try  taking the simvastatin at lunchtime and see whether she develops headache right after taking it or whether the headache is not related to it.     Past Medical History:  Diagnosis Date  . Allergy    seasonal  . Asthma   . Diabetes mellitus without complication ([place])   . GERD (gastroesophageal reflux disease)    hiatal hernia  . H/O: gout   . Hyperlipidemia   . Hypertension      Home Meds:  Outpatient Medications Prior to Visit  Medication Sig Dispense Refill  . albuterol (PROVENTIL HFA;VENTOLIN HFA) 108 (90 Base) MCG/ACT inhaler Inhale 2 puffs into  the lungs every 6 (six) hours as needed for wheezing or shortness of breath. 1 Inhaler 0  . aspirin 81 MG tablet Take 81 mg by mouth daily.    . calcium carbonate (OS-CAL) 600 MG TABS tablet Take 600 mg by mouth daily with breakfast.    . cholecalciferol (VITAMIN D) 1000 units tablet Take 1,000 Units by mouth daily.    . Choline Fenofibrate (FENOFIBRIC ACID) 135 MG CPDR TAKE ONE CAPSULE BY MOUTH EVERY NIGHT AT BEDTIME 90 capsule 0  . Diclofenac Sodium CR 100 MG 24 hr tablet TAKE ONE (1) TABLET EACH DAY (Patient taking differently: TAKE ONE (1) TABLET EACH DAY prn) 30 tablet 1  . fluticasone (FLONASE) 50 MCG/ACT nasal spray Place 2 sprays into the nose daily as needed.    . gabapentin (NEURONTIN) 300 MG capsule TAKE 1 CAPSULE BY MOUTH THREE TIMES A DAY 90 capsule 1  . lisinopril (PRINIVIL,ZESTRIL) 20 MG tablet TAKE ONE (1) TABLET BY MOUTH EVERY DAY 90 tablet 0  . Magnesium Oxide -Mg Supplement 400 MG CAPS Take 2 capsules by mouth daily. 30 capsule 5  . metFORMIN (GLUCOPHAGE) 500 MG tablet TAKE ONE TABLET BY MOUTH TWICE A DAY WITH A MEAL 180 tablet 1  . Multiple Vitamin (MULTIVITAMIN) tablet Take 1 tablet by mouth daily.    Marland Kitchen PREMARIN 0.625 MG tablet TAKE ONE (1) TABLET BY MOUTH EVERY DAY 90 tablet 0  . ranitidine (ZANTAC) 150 MG tablet Take 1 tablet (150 mg total) by mouth daily. 30 tablet 2  . simvastatin (ZOCOR) 10 MG tablet Take 1 tablet (10 mg total) by mouth every evening. 30 tablet 3   No facility-administered medications prior to visit.      Allergies:  Allergies  Allergen Reactions  . Codeine Nausea And Vomiting  . Macrobid [Nitrofurantoin] Hives    Social History   Socioeconomic History  . Marital status: Single    Spouse name: Not on file  . Number of children: Not on file  . Years of education: Not on file  . Highest education level: Not on file  Occupational History  . Not on file  Social Needs  . Financial resource strain: Not on file  . Food insecurity:    Worry:  Not on file    Inability: Not on file  . Transportation needs:    Medical: Not on file    Non-medical: Not on file  Tobacco Use  . Smoking status: Never Smoker  . Smokeless tobacco: Never Used  Substance and Sexual Activity  . Alcohol use: No  . Drug use: No  . Sexual activity: Not on file  Lifestyle  . Physical activity:    Days per week: Not on file    Minutes per session: Not on file  . Stress: Not on file  Relationships  . Social connections:    Talks  on phone: Not on file    Gets together: Not on file    Attends religious service: Not on file    Active member of club or organization: Not on file    Attends meetings of clubs or organizations: Not on file    Relationship status: Not on file  . Intimate partner violence:    Fear of current or ex partner: Not on file    Emotionally abused: Not on file    Physically abused: Not on file    Forced sexual activity: Not on file  Other Topics Concern  . Not on file  Social History Narrative   Retired March 2014.   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 Stroke Mother   . Diabetes Brother   . Cancer Neg Hx   . Heart disease Neg Hx      Review of Systems:  See HPI for pertinent ROS. All other ROS negative.    Physical Exam: Blood pressure 128/72, pulse 80, temperature 97.7 F (36.5 C), temperature source Oral, resp. rate 18, height 5\' 3"  (1.6 m), weight 74.6 kg (164 lb 6.4 oz), SpO2 96 %., Body mass index is 29.12 kg/m. General: WF. Appears in no acute distress. Neck: Supple. No thyromegaly. No lymphadenopathy.  No carotid bruit. Lungs: Clear bilaterally to auscultation without wheezes, rales, or rhonchi. Breathing is unlabored. Heart: RRR with S1 S2. No murmurs, rubs, or gallops. Abdomen: Soft, non-tender, non-distended with normoactive bowel sounds. No hepatomegaly. No rebound/guarding. No obvious abdominal masses. Musculoskeletal:  Strength and tone normal  for age. Extremities/Skin: Warm and dry.  No edema.  Diabetic foot exam: Inspection is normal bilaterally.  Sensation is intact bilaterally.  1+-2+ DP and PT pulses bilaterally. Neuro: Alert and oriented X 3. Moves all extremities spontaneously. Gait is normal. CNII-XII grossly in tact. Psych:  Responds to questions appropriately with a normal affect.     ASSESSMENT AND PLAN:  72 y.o. year old female with   Hypomagnesemia At lab 03/2016 Magnesium was low. At that time started Mg 400mg  QD.  - Magnesium level normal at lab 01/12/2017--cont current dose of Mg 07/24/2017: Recheck magnesium level today.  Diabetes mellitus without complication  06/21/5954:LOVFI A1C now.  Checked Micro Albumin 06/2016  --------------------04/17/2017: Today I discussed with her adding a low-dose statin.  She is agreeable with this.  Will add simvastatin 10 mg daily.  Reviewed that lipid panel 01/12/17 showed LDL 97.  LFTs were normal.  Recheck LFTs today to make sure that these are normal and if so we will start simvastatin 10 mg.------------------------------------ She is already on fenofibrate for high triglycerides.  Continue this.   She is on ACE inhibitor. Lisinopril 20 mg daily. She is on aspirin 81 mg daily.  She has Eye Exam at [facility] She is educated regarding proper foot care  Hypertension 07/24/2017: Blood Pressure at goal/controlled. Cont current med. Check lab to monitor. On ACE Inh.  Hyperlipidemia -------------------04/17/2017: Today I discussed with her adding a low-dose statin.  She is agreeable with this.  Will add simvastatin 10 mg daily.  Reviewed that lipid panel 01/12/17 showed LDL 97.  LFTs were normal.  Recheck LFTs today to make sure that these are normal and if so we will start simvastatin 10 mg.------------------------------------ She is already on fenofibrate for high triglycerides.  Continue this.  07/24/2017: Today she reports that she has been taking the simvastatin at dinnertime and  then that evening  will have a headache.  Thought that the simvastatin was the cause of the headache so stopped the simvastatin last week. Today I discussed with her that any medicine can cause any kind of side effect with different people but that this is not a common side effect for statins to cause headache. She then states that she does have a lot of problems with allergies and pollen and that she may just be having sinus headache. States that she will adjust the time of day that she takes the simvastatin and monitor to see if headache is in relation to the simvastatin or not.    Vitamin D Deficiency 07/24/2017:She is on Vit D supplement and has had labs to monitor  Left knee pain After office visit 06/2014 she saw Dr. Lyla Glassing at [facility]. Patient states that she was told she has "no cartilage and bone on bone" in that left knee. Says that she was treated with injection,  Exercises, wedges in in her shoes. Was told that the alternative would be a total knee replacement. She does not want to undergo this surgery unless absolutely necessary. Also does not want to have to do repeat injections unless absolutely necessary. 04/17/2017:  I encouraged her to follow-up with orthopedics--- especially if this is limiting her ability to walk and get exercise and especially if it is preventing her from doing ADLs and grocery shopping. 07/24/2017: He is now seeing Dr. Lyla Glassing at [place] regarding her knee pain.  Is receiving injections.  GERD (gastroesophageal reflux disease) 01/12/2017:Symptoms controlled with current medication. Mg and B12 levels checked 09/23/2015 Mg was low 03/2016--added daily supplement.  01/12/2017: Recheck magnesium level normal--cont current dose Mg supplement 04/17/2017: Last magnesium level was within normal limits.  Continue current dose of magnesium. 07/24/2017: She has continued on magnesium supplement.  Will recheck magnesium level.  Allergy Stable.    Asthma 07/24/2017:No recent flares.   H/O: gout 07/24/2017:No Recent flare.  Hormone replacement therapy: We have discussed risk multiple times including cardiovascular risk of blood clots risk and risk of breast cancer and uterine cancer. She is aware of risk. Has opted to continue the medication. Without the medication she has severe hot flashes and other symptoms. 07/24/2017: Continue current dose of HRT  Has history of hysterectomy. This was not performed secondary to any cancer. Therefore she does not need repeat Pap smear. At visit 09/2014 she refuses/defers pelvic exam. Says that she has no uterus and has no ovaries and a half nothing there to examine and does not want to go through that.  Mammogram:  05/2016--negative  DEXA.: August 2011: Normal   At visit 06/2014 discussed the fact that it is been 5 years since last bone density test and things could have changed. However, she defers rechecking at this time. She is on HRT which   decreases her risk for developing osteoporosis. Will rediscuss in future. Rediscussed at [place] 09/2014--still defers.  DEXA: 05/03/2016: Normal ---Cont Calcium, Vit D.  --- Last vitamin D level was checked 11/28/2012. Level was normal at 45. See above Regarding Vitamin D Deficiency 09/2014, 03/2015  Colonoscopy: July 2008. Repeat 10 years.  Discussed the colonoscopy due this summer 2018 01/12/2017: Discussed that her colonoscopy is due. She does not want repeat colonoscopy. She is age 39 and does not feel that risk versus benefit make sense for her to do another colonoscopy. I then discussed Hemoccult and cologuard. I had planned to just go ahead and give her the cologuard information today but she  told staff that she wanted to think about it and will let us know if she wants to pick it up. We did have conversation today about risk of procedures versus benefits and benefits of knowing that you have an early cancer. She will think this over further and let us  know.  Immunizations: Pneumonia vaccine:              --Pneumovax was given here in November 2012.  -- Prevnar 13 given here 06/06/2013 Zostavax:  Received 07/02/2014 Flu vaccine--Given here 03/12/2014-01/12/2017 Tetanus: Will not give as she has Medicare  Routine office visit 3 months. Sooner if needed.  Signed, [address] [place], [place], [facility] 07/24/2017 11:43 AM

## 2017-07-25 LAB — HEMOGLOBIN A1C
Hgb A1c MFr Bld: 6.2 % of total Hgb — ABNORMAL HIGH (ref <5.7)
MEAN PLASMA GLUCOSE: 131 (calc)
eAG (mmol/L): 7.3 (calc)

## 2017-07-25 LAB — COMPLETE METABOLIC PANEL WITH GFR
AG RATIO: 1.9 (calc) (ref 1.0–2.5)
ALKALINE PHOSPHATASE (APISO): 16 U/L — AB (ref 33–130)
ALT: 13 U/L (ref 6–29)
AST: 18 U/L (ref 10–35)
Albumin: 4.3 g/dL (ref 3.6–5.1)
BUN: 17 mg/dL (ref 7–25)
CALCIUM: 9.5 mg/dL (ref 8.6–10.4)
CO2: 26 mmol/L (ref 20–32)
Chloride: 103 mmol/L (ref 98–110)
Creat: 0.74 mg/dL (ref 0.60–0.93)
GFR, EST NON AFRICAN AMERICAN: 81 mL/min/{1.73_m2} (ref >=60)
GFR, Est African American: 94 mL/min/{1.73_m2} (ref >=60)
GLOBULIN: 2.3 g/dL (ref 1.9–3.7)
Glucose, Bld: 88 mg/dL (ref 65–99)
POTASSIUM: 4.4 mmol/L (ref 3.5–5.3)
Sodium: 138 mmol/L (ref 135–146)
Total Bilirubin: 0.4 mg/dL (ref 0.2–1.2)
Total Protein: 6.6 g/dL (ref 6.1–8.1)

## 2017-07-25 LAB — MAGNESIUM: Magnesium: 1.2 mg/dL — ABNORMAL LOW (ref 1.5–2.5)

## 2017-07-27 ENCOUNTER — Telehealth: Payer: Self-pay

## 2017-07-27 ENCOUNTER — Other Ambulatory Visit: Payer: Self-pay | Admitting: Physician Assistant

## 2017-07-27 NOTE — Telephone Encounter (Signed)
Call placed to patient to discuss her medications she states she has been taking magnesium 400mg  1 daily.  Patient was advised to take 400mg  bid. Patient verbalizes understanding

## 2017-07-27 NOTE — Telephone Encounter (Signed)
-----   Message from Orlena Sheldon, PA-C sent at 07/25/2017  7:21 AM EDT ----- Magnesium level is low again. Is she still taking the Magnesium supplement? Is she taking 400mg  2 daily (to equal 800mg  per day)?  If she is not taking that dose, then re-start. If she is taking that dose, then increase to taking 3 per day.  A1C is good at 6.2. Cont current treatment for diabetes the same with no changes.

## 2017-09-12 DIAGNOSIS — M25562 Pain in left knee: Secondary | ICD-10-CM | POA: Diagnosis not present

## 2017-09-12 DIAGNOSIS — M1712 Unilateral primary osteoarthritis, left knee: Secondary | ICD-10-CM | POA: Diagnosis not present

## 2017-09-18 ENCOUNTER — Other Ambulatory Visit: Payer: Self-pay | Admitting: Physician Assistant

## 2017-09-28 ENCOUNTER — Other Ambulatory Visit: Payer: Self-pay | Admitting: Physician Assistant

## 2017-10-16 ENCOUNTER — Other Ambulatory Visit: Payer: Self-pay | Admitting: Physician Assistant

## 2017-10-26 ENCOUNTER — Encounter: Payer: Self-pay | Admitting: Physician Assistant

## 2017-10-26 ENCOUNTER — Other Ambulatory Visit: Payer: Self-pay

## 2017-10-26 ENCOUNTER — Ambulatory Visit (INDEPENDENT_AMBULATORY_CARE_PROVIDER_SITE_OTHER): Payer: Medicare Other | Admitting: Physician Assistant

## 2017-10-26 VITALS — BP 118/74 | HR 81 | Temp 97.6°F | Resp 18 | Ht 63.0 in | Wt 165.2 lb

## 2017-10-26 DIAGNOSIS — E559 Vitamin D deficiency, unspecified: Secondary | ICD-10-CM | POA: Diagnosis not present

## 2017-10-26 DIAGNOSIS — E785 Hyperlipidemia, unspecified: Secondary | ICD-10-CM

## 2017-10-26 DIAGNOSIS — I10 Essential (primary) hypertension: Secondary | ICD-10-CM | POA: Diagnosis not present

## 2017-10-26 DIAGNOSIS — E119 Type 2 diabetes mellitus without complications: Secondary | ICD-10-CM

## 2017-10-26 DIAGNOSIS — K219 Gastro-esophageal reflux disease without esophagitis: Secondary | ICD-10-CM

## 2017-10-26 MED ORDER — PRAVASTATIN SODIUM 10 MG PO TABS: 10.0000 mg | ORAL_TABLET | Freq: Every day | ORAL | 3 refills | Status: DC

## 2017-10-26 NOTE — Progress Notes (Signed)
Patient ID: Jennifer Porter MRN: [id number], DOB: 04/07/1945, 72 y.o. Date of Encounter: @DATE @  Chief Complaint:  Chief Complaint  Patient presents with  . follow up with cholesterol  . follow up with diabetes    HPI: 72 y.o. year old female  presents for ROV.   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  At multiple prior OVs  she said that she and her husband were busy with --planting their garden.   At f/u OV she said she often has been volunteering several days at the school helping out. Continues to be active outside in the yard.  As well, at office visit 11/2013 she says that she has continued to do her walking--been walking 15-20 minutes once a day either in the morning or the evening depending on the weather weather etc.  As well she has told me in the past that she is careful with her diet. Says that she has never really been one to snack or munch and tries to watch what she eats for meals.  Knee: At [facility] 07/24/2017--she reports that she has been having problems with her knee.  States that she had an injection and is scheduled for another injection in a week or so.  Is seeing Dr. Lyla Glassing for this. States that she has had to decrease her walking because of this knee pain.  Is walking every day for exercise but now only doing this about 3 days a week and not walking as far.  States that she walks down the road and back.   10/26/2017: She reports that she has continued to get injections in her knee.  Orthopedic/Dr. Lyla Glassing has told her that she ideally needs to have replacement surgery but she is trying to put that off if at all possible.   Diabetes:  07/24/2017: She is taking her metformin as directed. Also eats low carbohydrate diet. Also stays active and walks on a regular basis.  At visit 07/24/2017 she states she has had to decrease the amount of walking secondary to left knee pain.  See above regarding that. She states that her peripheral  neuropathy symptoms are controlled with current dose of gabapentin. 10/26/2017: Again today states that orthopedics has told her to limit her walking secondary to her knee pain.  She has continued low carbohydrate diet.  Does continue to take diabetic meds as directed.  They are causing no adverse effects.  Hypertension:  10/26/2017:She is taking her lisinopril as directed. No lightheadedness or other adverse effects.  GERD:  10/26/2017:She states that she does not have to take her Prilosec every day but only as needed for acid reflux/heartburn symptoms.  Vitamin D deficiency:  8/82019:She states that she is taking vitamin D as directed.  Low Magnesium--At [facility] 03/2016--this was low--had her start daily Mg supplement. She is taking this daily now.  10/26/2017: Continues to take daily magnesium supplement.   Hormone replacement therapy: We have discussed risk multiple times including cardiovascular risk of blood clots risk and risk of breast cancer and uterine cancer. She is aware of risk. Has opted to continue the medication. Without the medication, she has severe hot flashes and other symptoms. 10/26/2017: Will continue current dose of HRT.  H/O Gout:  10/26/2017:She has had no recent flare in past year  Hyperlipidemia: 07/24/2017: She states that she was taking the simvastatin each evening but she quit taking it just over the past 1 week.  States that she would take  the simvastatin when she ate her dinner and then a little later would develop a headache.  The headache was secondary to the simvastatin.  Therefore stopped taking the simvastatin about a week ago.  Adds that she does have a lot of problems with allergies and pollen and that it may all be a sinus headache.  Discussed with her that any medicine can cause any side effect for different people but that headache is not a common side effect of statin.  He states that she will try taking the simvastatin at lunchtime and see whether she develops  headache right after taking it or whether the headache is not related to it.  10/26/2017: Reports that after last visit she did start back on the simvastatin and did continue it for another 2 or 3 weeks but it definitely did cause headache to reoccur.  She then went back off of the simvastatin and the headache did resolve.  Feels certain that the simvastatin was the cause of the headache. Today discussed replacing simvastatin with pravastatin and she is agreeable.    Past Medical History:  Diagnosis Date  . Allergy    seasonal  . Asthma   . Diabetes mellitus without complication ([facility])   . GERD (gastroesophageal reflux disease)    hiatal hernia  . H/O: gout   . Hyperlipidemia   . Hypertension      Home Meds:  Outpatient Medications Prior to Visit  Medication Sig Dispense Refill  . albuterol (PROVENTIL HFA;VENTOLIN HFA) 108 (90 Base) MCG/ACT inhaler Inhale 2 puffs into the lungs every 6 (six) hours as needed for wheezing or shortness of breath. 1 Inhaler 0  . aspirin 81 MG tablet Take 81 mg by mouth daily.    . calcium carbonate (OS-CAL) 600 MG TABS tablet Take 600 mg by mouth daily with breakfast.    . cholecalciferol (VITAMIN D) 1000 units tablet Take 1,000 Units by mouth daily.    . Choline Fenofibrate 135 MG capsule TAKE ONE CAPSULE BY MOUTH EVERY NIGHT ATBEDTIME 90 capsule 0  . Diclofenac Sodium CR 100 MG 24 hr tablet TAKE ONE (1) TABLET EACH DAY prn 30 tablet 0  . fluticasone (FLONASE) 50 MCG/ACT nasal spray Place 2 sprays into the nose daily as needed.    . gabapentin (NEURONTIN) 300 MG capsule TAKE 1 CAPSULE BY MOUTH THREE TIMES A DAY 90 capsule 1  . lisinopril (PRINIVIL,ZESTRIL) 20 MG tablet TAKE ONE (1) TABLET BY MOUTH EVERY DAY 90 tablet 0  . Magnesium Oxide -Mg Supplement 400 MG CAPS Take 2 capsules by mouth daily. 30 capsule 5  . metFORMIN (GLUCOPHAGE) 500 MG tablet TAKE ONE TABLET BY MOUTH TWICE A DAY WITH A MEAL 180 tablet 1  . Multiple Vitamin (MULTIVITAMIN) tablet Take  1 tablet by mouth daily.    Marland Kitchen PREMARIN 0.625 MG tablet TAKE ONE (1) TABLET BY MOUTH EVERY DAY 90 tablet 0  . ranitidine (ZANTAC) 150 MG tablet Take 1 tablet (150 mg total) by mouth daily. 30 tablet 2  . simvastatin (ZOCOR) 10 MG tablet Take 1 tablet (10 mg total) by mouth every evening. 30 tablet 3   No facility-administered medications prior to visit.      Allergies:  Allergies  Allergen Reactions  . Codeine Nausea And Vomiting  . Macrobid [Nitrofurantoin] Hives    Social History   Socioeconomic History  . Marital status: Single    Spouse name: Not on file  . Number of children: Not on file  . Years  of education: Not on file  . Highest education level: Not on file  Occupational History  . Not on file  Social Needs  . Financial resource strain: Not on file  . Food insecurity:    Worry: Not on file    Inability: Not on file  . Transportation needs:    Medical: Not on file    Non-medical: Not on file  Tobacco Use  . Smoking status: Never Smoker  . Smokeless tobacco: Never Used  Substance and Sexual Activity  . Alcohol use: No  . Drug use: No  . Sexual activity: Not on file  Lifestyle  . Physical activity:    Days per week: Not on file    Minutes per session: Not on file  . Stress: Not on file  Relationships  . Social connections:    Talks on phone: Not on file    Gets together: Not on file    Attends religious service: Not on file    Active member of club or organization: Not on file    Attends meetings of clubs or organizations: Not on file    Relationship status: Not on file  . Intimate partner violence:    Fear of current or ex partner: Not on file    Emotionally abused: Not on file    Physically abused: Not on file    Forced sexual activity: Not on file  Other Topics Concern  . Not on file  Social History Narrative   Retired March 2014.   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 Stroke Mother    . Diabetes Brother   . Cancer Neg Hx   . Heart disease Neg Hx      Review of Systems:  See HPI for pertinent ROS. All other ROS negative.    Physical Exam: Blood pressure 118/74, pulse 81, temperature 97.6 F (36.4 C), temperature source Oral, resp. rate 18, height 5\' 3"  (1.6 m), weight 74.9 kg, SpO2 98 %., Body mass index is 29.26 kg/m. General: WF. Appears in no acute distress. Neck: Supple. No thyromegaly. No lymphadenopathy. No carotid bruits. Lungs: Clear bilaterally to auscultation without wheezes, rales, or rhonchi. Breathing is unlabored. Heart: RRR with S1 S2. No murmurs, rubs, or gallops. Abdomen: Soft, non-tender, non-distended with normoactive bowel sounds. No hepatomegaly. No rebound/guarding. No obvious abdominal masses. Musculoskeletal:  Strength and tone normal for age. Extremities/Skin: Warm and dry.  No LE edema.  Neuro: Alert and oriented X 3. Moves all extremities spontaneously. Gait is normal. CNII-XII grossly in tact. Psych:  Responds to questions appropriately with a normal affect.    Diabetic foot exam: Inspection is normal bilaterally.  Sensation is intact bilaterally.  1+-2+ DP and PT pulses bilaterally.     ASSESSMENT AND PLAN:  72 y.o. year old female with   Hypomagnesemia At lab 03/2016 Magnesium was low. At that time started Mg 400mg  QD.  - Magnesium level normal at lab 01/12/2017--cont current dose of Mg 07/24/2017: Recheck magnesium level today. 10/26/2017: Mg level was low again at last check.  Dose adjusted.  Recheck magnesium level today.   Diabetes mellitus without complication  0/08/3014:WFUXN A1C now, Check MicroAlbumin  Checked Micro Albumin 06/2016  10/26/2017: See Below Regarding statin  She is on ACE inhibitor. Lisinopril 20 mg daily. She is on aspirin 81 mg daily.  She has Eye Exam at [facility] She is educated regarding proper foot care    Hypertension  10/26/2017: Blood Pressure at goal/controlled. Cont current med. Check lab to  monitor. On ACE Inh.  Hyperlipidemia -------------------04/17/2017: Today I discussed with her adding a low-dose statin.  She is agreeable with this.  Will add simvastatin 10 mg daily.  Reviewed that lipid panel 01/12/17 showed LDL 97.  LFTs were normal.  Recheck LFTs today to make sure that these are normal and if so we will start simvastatin 10 mg.------------------------------------ She is already on fenofibrate for high triglycerides.  Continue this.  07/24/2017: Today she reports that she has been taking the simvastatin at dinnertime and then that evening will have a headache.  Thought that the simvastatin was the cause of the headache so stopped the simvastatin last week. Today I discussed with her that any medicine can cause any kind of side effect with different people but that this is not a common side effect for statins to cause headache. She then states that she does have a lot of problems with allergies and pollen and that she may just be having sinus headache. States that she will adjust the time of day that she takes the simvastatin and monitor to see if headache is in relation to the simvastatin or not. 10/26/2017:  10/26/2017: Reports that after last visit she did start back on the simvastatin and did continue it for another 2 or 3 weeks but it definitely did cause headache to reoccur.  She then went back off of the simvastatin and the headache did resolve.  Feels certain that the simvastatin was the cause of the headache. Today discussed replacing simvastatin with pravastatin and she is agreeable. Today I have removed simvastatin 10 mg from the med list.  I have replaced with pravastatin 10 mg daily.  Have discussed with patient.  She is agreeable to start the pravastatin.  If she thinks this is causing any adverse effects then follow-up with me.  Vitamin D Deficiency 10/26/2017:She is on Vit D supplement and has had labs to monitor  Left knee pain After office visit 06/2014 she saw Dr.  Lyla Glassing at [facility]. Patient states that she was told she has "no cartilage and bone on bone" in that left knee. Says that she was treated with injection,  Exercises, wedges in in her shoes. Was told that the alternative would be a total knee replacement. She does not want to undergo this surgery unless absolutely necessary. Also does not want to have to do repeat injections unless absolutely necessary. 04/17/2017:  I encouraged her to follow-up with orthopedics--- especially if this is limiting her ability to walk and get exercise and especially if it is preventing her from doing ADLs and grocery shopping. 10/26/2017: She is now seeing Dr. Lyla Glassing at [facility] regarding her knee pain.  Is receiving injections.  GERD (gastroesophageal reflux disease) 01/12/2017:Symptoms controlled with current medication. Mg and B12 levels checked 09/23/2015 Mg was low 03/2016--added daily supplement.  01/12/2017: Recheck magnesium level normal--cont current dose Mg supplement 04/17/2017: Last magnesium level was within normal limits.  Continue current dose of magnesium. 10/26/2017: She has continued on magnesium supplement.  Will recheck magnesium level.  Allergy Stable.   Asthma 10/26/2017:No recent flares.   H/O: gout 10/26/2017:No Recent flare.  Hormone replacement therapy: We have discussed risk multiple times including cardiovascular risk of blood clots risk and risk of breast cancer and uterine cancer. She is aware of risk. Has opted to continue the medication. Without the medication she has severe hot flashes and other symptoms. 10/26/2017: Continue current dose  of HRT  Has history of hysterectomy. This was not performed secondary to any cancer. Therefore she does not need repeat Pap smear. At visit 09/2014 she refuses/defers pelvic exam. Says that she has no uterus and has no ovaries and a half nothing there to examine and does not want to go through that.  Mammogram:   05/2016--negative  DEXA.: August 2011: Normal   At visit 06/2014 discussed the fact that it is been 5 years since last bone density test and things could have changed. However, she defers rechecking at this time. She is on HRT which   decreases her risk for developing osteoporosis. Will rediscuss in future. Rediscussed at [facility] 09/2014--still defers.  DEXA: 05/03/2016: Normal ---Cont Calcium, Vit D.  --- Last vitamin D level was checked 11/28/2012. Level was normal at 45. See above Regarding Vitamin D Deficiency 09/2014, 03/2015  Colonoscopy: July 2008. Repeat 10 years.  Discussed the colonoscopy due this summer 2018 01/12/2017: Discussed that her colonoscopy is due. She does not want repeat colonoscopy. She is age 43 and does not feel that risk versus benefit make sense for her to do another colonoscopy. I then discussed Hemoccult and cologuard. I had planned to just go ahead and give her the cologuard information today but she told staff that she wanted to think about it and will let us know if she wants to pick it up. We did have conversation today about risk of procedures versus benefits and benefits of knowing that you have an early cancer. She will think this over further and let us know.  Immunizations: Pneumonia vaccine:              --Pneumovax was given here in November 2012.  -- Prevnar 13 given here 06/06/2013 Zostavax:  Received 07/02/2014 Flu vaccine--Given here 03/12/2014-01/12/2017 Tetanus: Will not give as she has Medicare  Routine office visit 3 months. Sooner if needed.  Marin Olp [place], [place], [facility] 10/26/2017 8:24 AM

## 2017-10-27 LAB — LIPID PANEL
CHOLESTEROL: 195 mg/dL (ref <200)
HDL: 56 mg/dL (ref >50)
LDL CHOLESTEROL (CALC): 108 mg/dL — AB
Non-HDL Cholesterol (Calc): 139 mg/dL (calc) — ABNORMAL HIGH (ref <130)
TRIGLYCERIDES: 185 mg/dL — AB (ref <150)
Total CHOL/HDL Ratio: 3.5 (calc) (ref <5.0)

## 2017-10-27 LAB — MICROALBUMIN, URINE: Microalb, Ur: 2.5 mg/dL

## 2017-10-27 LAB — MAGNESIUM: MAGNESIUM: 1.6 mg/dL (ref 1.5–2.5)

## 2017-10-27 LAB — HEMOGLOBIN A1C
EAG (MMOL/L): 7.7 (calc)
Hgb A1c MFr Bld: 6.5 % of total Hgb — ABNORMAL HIGH (ref <5.7)
MEAN PLASMA GLUCOSE: 140 (calc)

## 2017-10-30 ENCOUNTER — Other Ambulatory Visit: Payer: Self-pay | Admitting: Physician Assistant

## 2017-11-21 ENCOUNTER — Other Ambulatory Visit: Payer: Self-pay | Admitting: Physician Assistant

## 2017-12-15 DIAGNOSIS — M1712 Unilateral primary osteoarthritis, left knee: Secondary | ICD-10-CM | POA: Diagnosis not present

## 2017-12-27 ENCOUNTER — Encounter: Payer: Self-pay | Admitting: Physician Assistant

## 2017-12-27 ENCOUNTER — Ambulatory Visit (INDEPENDENT_AMBULATORY_CARE_PROVIDER_SITE_OTHER): Payer: Medicare Other | Admitting: Physician Assistant

## 2017-12-27 VITALS — BP 144/76 | HR 93 | Temp 98.9°F | Resp 18 | Wt 163.4 lb

## 2017-12-27 DIAGNOSIS — J019 Acute sinusitis, unspecified: Secondary | ICD-10-CM

## 2017-12-27 MED ORDER — FLUTICASONE PROPIONATE 50 MCG/ACT NA SUSP: 2.0000 | Freq: Every day | NASAL | 5 refills | Status: DC | PRN

## 2017-12-27 MED ORDER — AMOXICILLIN-POT CLAVULANATE 875-125 MG PO TABS: 1.0000 | ORAL_TABLET | Freq: Two times a day (BID) | ORAL | 0 refills | Status: AC

## 2017-12-27 MED ORDER — PREDNISONE 20 MG PO TABS: ORAL_TABLET | ORAL | 0 refills | Status: DC

## 2017-12-27 NOTE — Progress Notes (Signed)
Patient ID: Jennifer Porter MRN: [id number], DOB: 1946/01/26, 72 y.o. Date of Encounter: 12/27/2017, 3:05 PM    Chief Complaint:  Chief Complaint  Patient presents with  . Facial Pain     HPI: 72 y.o. year old female presents with above.   Reports that she is having a lot of pain and pressure in her frontal sinus region and maxillary sinus region.  Also points to the bridge of her nose as area that feels very congested.  That it just feels stopped up and a lot of pressure.  She has been using her Flonase nasal spray and been taking ibuprofen.  Has gotten out some mucus but mostly it just feels very stopped up.  Has had no chest congestion.  Very little cough secondary to drainage down the throat.  No significant sore throat.  No fevers or chills.     Home Meds:   Outpatient Medications Prior to Visit  Medication Sig Dispense Refill  . albuterol (PROVENTIL HFA;VENTOLIN HFA) 108 (90 Base) MCG/ACT inhaler Inhale 2 puffs into the lungs every 6 (six) hours as needed for wheezing or shortness of breath. 1 Inhaler 0  . aspirin 81 MG tablet Take 81 mg by mouth daily.    . calcium carbonate (OS-CAL) 600 MG TABS tablet Take 600 mg by mouth daily with breakfast.    . cholecalciferol (VITAMIN D) 1000 units tablet Take 1,000 Units by mouth daily.    . Choline Fenofibrate (FENOFIBRIC ACID) 135 MG CPDR TAKE ONE CAPSULE BY MOUTH EVERY NIGHT AT BEDTIME 90 capsule 0  . Diclofenac Sodium CR 100 MG 24 hr tablet TAKE ONE (1) TABLET EACH DAY prn 30 tablet 0  . fluticasone (FLONASE) 50 MCG/ACT nasal spray Place 2 sprays into the nose daily as needed.    . gabapentin (NEURONTIN) 300 MG capsule TAKE 1 CAPSULE BY MOUTH THREE TIMES A DAY 90 capsule 1  . lisinopril (PRINIVIL,ZESTRIL) 20 MG tablet TAKE ONE (1) TABLET BY MOUTH EVERY DAY 90 tablet 0  . Magnesium Oxide -Mg Supplement 400 MG CAPS Take 2 capsules by mouth daily. 30 capsule 5  . metFORMIN (GLUCOPHAGE) 500 MG tablet TAKE ONE TABLET BY MOUTH TWICE A  DAY WITH A MEAL 180 tablet 1  . Multiple Vitamin (MULTIVITAMIN) tablet Take 1 tablet by mouth daily.    . pravastatin (PRAVACHOL) 10 MG tablet Take 1 tablet (10 mg total) by mouth daily. 30 tablet 3  . PREMARIN 0.625 MG tablet TAKE ONE (1) TABLET BY MOUTH EVERY DAY 90 tablet 0  . ranitidine (ZANTAC) 150 MG tablet Take 1 tablet (150 mg total) by mouth daily. 30 tablet 2   No facility-administered medications prior to visit.     Allergies:  Allergies  Allergen Reactions  . Codeine Nausea And Vomiting  . Macrobid [Nitrofurantoin] Hives  . Simvastatin     headache      Review of Systems: See HPI for pertinent ROS. All other ROS negative.    Physical Exam: Blood pressure (!) 144/76, pulse 93, temperature 98.9 F (37.2 C), temperature source Oral, resp. rate 18, weight 74.1 kg, SpO2 98 %., Body mass index is 28.95 kg/m. General:  WF. Appears in no acute distress. HEENT: Normocephalic, atraumatic, eyes without discharge, sclera non-icteric, nares are without discharge. Bilateral auditory canals clear, TM's are without perforation, pearly grey and translucent with reflective cone of light bilaterally. Oral cavity moist, posterior pharynx without exudate, erythema.  She does have mild tenderness with percussion to frontal and  maxillary sinuses.  Neck: Supple. No thyromegaly. No lymphadenopathy. Lungs: Clear bilaterally to auscultation without wheezes, rales, or rhonchi. Breathing is unlabored. Heart: Regular rhythm. No murmurs, rubs, or gallops. Msk:  Strength and tone normal for age. Extremities/Skin: Warm and dry.  Neuro: Alert and oriented X 3. Moves all extremities spontaneously. Gait is normal. CNII-XII grossly in tact. Psych:  Responds to questions appropriately with a normal affect.     ASSESSMENT AND PLAN:  72 y.o. year old female with  1. Acute sinusitis, recurrence not specified, unspecified location Discussed that I was going to prescribe Augmentin and some prednisone.  She  defers prednisone.  Therefore she will just continue using Flonase nasal spray to help with the inflammation swelling and congestion in the sinuses.  Will take the Augmentin as directed.  Follow-up if symptoms do not resolve with this. - amoxicillin-clavulanate (AUGMENTIN) 875-125 MG tablet; Take 1 tablet by mouth 2 (two) times daily for 14 days.  Dispense: 28 tablet; Refill: 0    Signed, [address] [place], [place], [facility] 12/27/2017 3:05 PM

## 2018-01-01 ENCOUNTER — Other Ambulatory Visit: Payer: Self-pay | Admitting: Physician Assistant

## 2018-01-15 ENCOUNTER — Other Ambulatory Visit: Payer: Self-pay | Admitting: Physician Assistant

## 2018-01-24 LAB — HM DIABETES EYE EXAM

## 2018-01-31 ENCOUNTER — Other Ambulatory Visit: Payer: Self-pay | Admitting: Physician Assistant

## 2018-01-31 ENCOUNTER — Ambulatory Visit (INDEPENDENT_AMBULATORY_CARE_PROVIDER_SITE_OTHER): Payer: Medicare Other | Admitting: Family Medicine

## 2018-01-31 ENCOUNTER — Other Ambulatory Visit: Payer: Self-pay

## 2018-01-31 ENCOUNTER — Ambulatory Visit: Payer: Medicare Other | Admitting: Physician Assistant

## 2018-01-31 ENCOUNTER — Encounter: Payer: Self-pay | Admitting: Family Medicine

## 2018-01-31 VITALS — BP 132/68 | HR 88 | Temp 98.2°F | Resp 12 | Ht 63.0 in | Wt 163.0 lb

## 2018-01-31 DIAGNOSIS — E785 Hyperlipidemia, unspecified: Secondary | ICD-10-CM

## 2018-01-31 DIAGNOSIS — I10 Essential (primary) hypertension: Secondary | ICD-10-CM

## 2018-01-31 DIAGNOSIS — E119 Type 2 diabetes mellitus without complications: Secondary | ICD-10-CM

## 2018-01-31 DIAGNOSIS — G8929 Other chronic pain: Secondary | ICD-10-CM

## 2018-01-31 DIAGNOSIS — E559 Vitamin D deficiency, unspecified: Secondary | ICD-10-CM

## 2018-01-31 DIAGNOSIS — M25562 Pain in left knee: Secondary | ICD-10-CM

## 2018-01-31 DIAGNOSIS — J4520 Mild intermittent asthma, uncomplicated: Secondary | ICD-10-CM

## 2018-01-31 DIAGNOSIS — K219 Gastro-esophageal reflux disease without esophagitis: Secondary | ICD-10-CM

## 2018-01-31 DIAGNOSIS — K589 Irritable bowel syndrome without diarrhea: Secondary | ICD-10-CM

## 2018-01-31 DIAGNOSIS — Z23 Encounter for immunization: Secondary | ICD-10-CM

## 2018-01-31 MED ORDER — TRAMADOL HCL 50 MG PO TABS: 50.0000 mg | ORAL_TABLET | Freq: Two times a day (BID) | ORAL | 0 refills | Status: DC | PRN

## 2018-01-31 MED ORDER — PRAVASTATIN SODIUM 10 MG PO TABS: 10.0000 mg | ORAL_TABLET | Freq: Every day | ORAL | 1 refills | Status: DC

## 2018-01-31 MED ORDER — PANTOPRAZOLE SODIUM 40 MG PO TBEC: 40.0000 mg | DELAYED_RELEASE_TABLET | Freq: Every day | ORAL | 0 refills | Status: DC

## 2018-01-31 NOTE — Patient Instructions (Addendum)
Get from your local pharmacy slow release magnesium supplement - take in place of your current magnesium  Take Protonix medicine on an empty stomach -this can be first thing in the morning and do not eat for 1 hour afterwards or lasting at night without eating 2 hours prior.    Going to wait and see what your sugar levels are and see if we can adjust your metformin to an extended release formula, may also limit some of your GI side effects.  Follow-up in 2 weeks to recheck your abdominal symptoms

## 2018-01-31 NOTE — Progress Notes (Signed)
Patient ID: Jennifer Porter, female    DOB: 1946/03/06, 72 y.o.   MRN: [id number]  PCP: Orlena Sheldon, PA-C  Chief Complaint  Patient presents with  . Follow-up    is fasting    Subjective:   Jennifer Porter is a 72 y.o. female, presents to clinic with CC of HTN, HLD, DM, GERD, follow up, labs and med refills for multiple conditions, also worsening GERD and dyspepsia and diarrhea secondary to meds and supplements  HLD - 01/31/18 Tolerating pravastatin and fenofibrate Fasting labs today  DM - on metformin 500 mg q , last A1C increased to 6.5, repeat A1C today No SE with metformin  HTN - lisinopril 20 mg, tolerating, no SE  GERD -worsening reflux and abd indigestion with frequent NSAIDs for chronic left knee pain.   Wt Readings from Last 5 Encounters:  01/31/18 163 lb (73.9 kg)  12/27/17 163 lb 6.4 oz (74.1 kg)  10/26/17 165 lb 3.2 oz (74.9 kg)  07/24/17 164 lb 6.4 oz (74.6 kg)  04/17/17 159 lb 3.2 oz (72.2 kg)     Most recent HPI from pt's PCP:  Knee: At [facility] 07/24/2017--she reports that she has been having problems with her knee.  States that she had an injection and is scheduled for another injection in a week or so.  Is seeing Dr. Lyla Glassing for this. States that she has had to decrease her walking because of this knee pain.  Is walking every day for exercise but now only doing this about 3 days a week and not walking as far.  States that she walks down the road and back.   10/26/2017: She reports that she has continued to get injections in her knee.  Orthopedic/Dr. Lyla Glassing has told her that she ideally needs to have replacement surgery but she is trying to put that off if at all possible.   Diabetes:  07/24/2017: She is taking her metformin as directed. Also eats low carbohydrate diet. Also stays active and walks on a regular basis.  At visit 07/24/2017 she states she has had to decrease the amount of walking secondary to left knee pain.  See above regarding that. She  states that her peripheral neuropathy symptoms are controlled with current dose of gabapentin. 10/26/2017: Again today states that orthopedics has told her to limit her walking secondary to her knee pain.  She has continued low carbohydrate diet.  Does continue to take diabetic meds as directed.  They are causing no adverse effects.  Hypertension:  10/26/2017:She is taking her lisinopril as directed. No lightheadedness or other adverse effects.  GERD:  10/26/2017:She states that she does not have to take her Prilosec every day but only as needed for acid reflux/heartburn symptoms.  Vitamin D deficiency:  8/82019:She states that she is taking vitamin D as directed.  Low Magnesium--At [facility] 03/2016--this was low--had her start daily Mg supplement. She is taking this daily now.  10/26/2017: Continues to take daily magnesium supplement.   Hormone replacement therapy: We have discussed risk multiple times including cardiovascular risk of blood clots risk and risk of breast cancer and uterine cancer. She is aware of risk. Has opted to continue the medication. Without the medication, she has severe hot flashes and other symptoms. 10/26/2017: Will continue current dose of HRT.  H/O Gout:  10/26/2017:She has had no recent flare in past year  Hyperlipidemia: 07/24/2017: She states that she was taking the simvastatin each evening but she quit taking it just over  the past 1 week.  States that she would take the simvastatin when she ate her dinner and then a little later would develop a headache.  The headache was secondary to the simvastatin.  Therefore stopped taking the simvastatin about a week ago.  Adds that she does have a lot of problems with allergies and pollen and that it may all be a sinus headache.  Discussed with her that any medicine can cause any side effect for different people but that headache is not a common side effect of statin.  He states that she will try taking the simvastatin at lunchtime  and see whether she develops headache right after taking it or whether the headache is not related to it.  10/26/2017: Reports that after last visit she did start back on the simvastatin and did continue it for another 2 or 3 weeks but it definitely did cause headache to reoccur.  She then went back off of the simvastatin and the headache did resolve.  Feels certain that the simvastatin was the cause of the headache. Today discussed replacing simvastatin with pravastatin and she is agreeable.   Patient Active Problem List   Diagnosis Date Noted  . Hormone replacement therapy 01/12/2017  . Hot flashes due to menopause 01/12/2017  . Hypomagnesemia 06/27/2016  . Diarrhea 03/28/2016  . Vitamin D deficiency 10/03/2014  . Left knee pain 07/02/2014  . Hypertension   . GERD (gastroesophageal reflux disease)   . Diabetes mellitus without complication ([place])   . Allergy   . Asthma   . H/O: gout   . Hyperlipidemia     Current Meds  Medication Sig  . albuterol (PROVENTIL HFA;VENTOLIN HFA) 108 (90 Base) MCG/ACT inhaler Inhale 2 puffs into the lungs every 6 (six) hours as needed for wheezing or shortness of breath.  Marland Kitchen aspirin 81 MG tablet Take 81 mg by mouth daily.  . calcium carbonate (OS-CAL) 600 MG TABS tablet Take 600 mg by mouth daily with breakfast.  . cholecalciferol (VITAMIN D) 1000 units tablet Take 1,000 Units by mouth daily.  . Choline Fenofibrate (FENOFIBRIC ACID) 135 MG CPDR TAKE ONE CAPSULE BY MOUTH EVERY NIGHT AT BEDTIME  . Diclofenac Sodium CR 100 MG 24 hr tablet TAKE ONE (1) TABLET EACH DAY prn  . fluticasone (FLONASE) 50 MCG/ACT nasal spray Place 2 sprays into both nostrils daily as needed.  . gabapentin (NEURONTIN) 300 MG capsule TAKE 1 CAPSULE BY MOUTH THREE TIMES A DAY  . lisinopril (PRINIVIL,ZESTRIL) 20 MG tablet TAKE ONE (1) TABLET BY MOUTH EVERY DAY  . Magnesium Oxide -Mg Supplement 400 MG CAPS Take 2 capsules by mouth daily.  . metFORMIN (GLUCOPHAGE) 500 MG tablet TAKE ONE  TABLET BY MOUTH TWICE A DAY WITH A MEAL  . Multiple Vitamin (MULTIVITAMIN) tablet Take 1 tablet by mouth daily.  . pravastatin (PRAVACHOL) 10 MG tablet Take 1 tablet (10 mg total) by mouth daily.  Marland Kitchen PREMARIN 0.625 MG tablet TAKE ONE (1) TABLET BY MOUTH EVERY DAY     Review of Systems  Constitutional: Negative.  Negative for activity change, appetite change, fatigue and unexpected weight change.  HENT: Negative.   Eyes: Negative.   Respiratory: Negative.  Negative for shortness of breath.   Cardiovascular: Negative.  Negative for chest pain, palpitations and leg swelling.  Gastrointestinal: Negative.  Negative for abdominal pain and blood in stool.  Endocrine: Negative.   Genitourinary: Negative.   Musculoskeletal: Positive for arthralgias. Negative for gait problem, joint swelling and myalgias.  Skin:  Negative.  Negative for color change, pallor and rash.  Allergic/Immunologic: Negative.   Neurological: Negative.  Negative for syncope and weakness.  Hematological: Negative.   Psychiatric/Behavioral: Negative.  Negative for confusion, dysphoric mood, self-injury and suicidal ideas. The patient is not nervous/anxious.        Objective:    Vitals:   01/31/18 0824  BP: 132/68  Pulse: 88  Resp: 12  Temp: 98.2 F (36.8 C)  TempSrc: Oral  SpO2: 98%  Weight: 163 lb (73.9 kg)  Height: 5\' 3"  (1.6 m)  Body mass index is 28.87 kg/m.    Physical Exam  Constitutional: She is oriented to person, place, and time. She appears well-developed and well-nourished.  Non-toxic appearance. No distress.  HENT:  Head: Normocephalic and atraumatic.  Right Ear: External ear normal.  Left Ear: External ear normal.  Nose: Nose normal.  Mouth/Throat: Uvula is midline, oropharynx is clear and moist and mucous membranes are normal.  Eyes: Pupils are equal, round, and reactive to light. Conjunctivae, EOM and lids are normal.  Neck: Normal range of motion and phonation normal. Neck supple. No  tracheal deviation present.  Cardiovascular: Normal rate, regular rhythm, normal heart sounds and normal pulses. Exam reveals no gallop and no friction rub.  No murmur heard. Pulses:      Radial pulses are 2+ on the right side, and 2+ on the left side.       Posterior tibial pulses are 2+ on the right side, and 2+ on the left side.  Pulmonary/Chest: Effort normal and breath sounds normal. No stridor. No respiratory distress. She has no wheezes. She has no rhonchi. She has no rales. She exhibits no tenderness.  Abdominal: Soft. Normal appearance and bowel sounds are normal. She exhibits no distension. There is no tenderness. There is no rebound and no guarding.  Musculoskeletal: Normal range of motion. She exhibits no edema or deformity.  Lymphadenopathy:    She has no cervical adenopathy.  Neurological: She is alert and oriented to person, place, and time. She exhibits normal muscle tone. Gait normal.  Skin: Skin is warm, dry and intact. Capillary refill takes less than 2 seconds. No rash noted. She is not diaphoretic. No pallor.  Psychiatric: She has a normal mood and affect. Her speech is normal and behavior is normal.          Assessment & Plan:    72 year old female here for routine follow-up on multiple chronic conditions      ICD-10-CM   1. Essential hypertension E33 COMPLETE METABOLIC PANEL WITH GFR    Hemoglobin A1c    Lipid panel    CBC with Differential/Platelet   at goal, no med SE, con't meds, check labs  2. Encounter for immunization Z23 Flu vaccine HIGH DOSE PF  3. Diabetes mellitus without complication (HCC) I95.1 COMPLETE METABOLIC PANEL WITH GFR    Hemoglobin A1c    Lipid panel    CBC with Differential/Platelet    metFORMIN (GLUCOPHAGE-XR) 750 MG 24 hr tablet   metformin 500 mg BID, some abdominal upset and loose stools, check labs  4. Hyperlipidemia, unspecified hyperlipidemia type O84.1 COMPLETE METABOLIC PANEL WITH GFR    Hemoglobin A1c    Lipid panel     CBC with Differential/Platelet    pravastatin (PRAVACHOL) 10 MG tablet   tolerating statin - recheck labs  5. Mild intermittent asthma without complication Y60.63    uncomplicated, well controlled, SABA prn  6. Vitamin D deficiency E55.9 VITAMIN D 25 Hydroxy (Vit-D  Deficiency, Fractures)   recheck vit D level  7. Gastroesophageal reflux disease, esophagitis presence not specified K21.9 pantoprazole (PROTONIX) 40 MG tablet   worse with frequent NSAIDs, hold nsaids, tx PPI  8. Irritable bowel syndrome, unspecified type K58.9   9. Chronic pain of left knee M25.562    G89.29    managed by ortho for injection - give tramadol to help pt avoid NSAIDS    Patient having some abdominal pain and symptoms with multiple medications that may be affecting her.  Metformin is low dose twice a day, may be causing some of her diarrhea, she is taking magnesium supplements which can also cause diarrhea.  She has been taking a lot of NSAIDs for left chronic knee pain has worsening GERD and gastritis type symptoms, will give her pain medication, hold NSAIDs for several weeks, take PPI to allow abdomen stomach to heal.  She can switch to a slow release magnesium supplement by checking with her local pharmacist, hopefully this will decrease the frequency of bowel movements and hopefully help some of the indigestion and dyspepsia.  Check A1c, would like to adjust patient's metformin to extended release to see if this would improve GI side effects.  Administration of Protonix reviewed and printed for the patient.  2-week follow-up for abdominal sx recheck  Delsa Grana, PA-C 01/31/18 8:42 AM

## 2018-02-01 LAB — CBC WITH DIFFERENTIAL/PLATELET
BASOS ABS: 101 {cells}/uL (ref 0–200)
Basophils Relative: 1.5 %
EOS ABS: 221 {cells}/uL (ref 15–500)
EOS PCT: 3.3 %
HCT: 39.8 % (ref 35.0–45.0)
Hemoglobin: 13.6 g/dL (ref 11.7–15.5)
LYMPHS ABS: 1923 {cells}/uL (ref 850–3900)
MCH: 31.1 pg (ref 27.0–33.0)
MCHC: 34.2 g/dL (ref 32.0–36.0)
MCV: 91.1 fL (ref 80.0–100.0)
MPV: 11.1 fL (ref 7.5–12.5)
Monocytes Relative: 7.1 %
NEUTROS PCT: 59.4 %
Neutro Abs: 3980 cells/uL (ref 1500–7800)
PLATELETS: 296 10*3/uL (ref 140–400)
RBC: 4.37 10*6/uL (ref 3.80–5.10)
RDW: 12 % (ref 11.0–15.0)
Total Lymphocyte: 28.7 %
WBC mixed population: 476 cells/uL (ref 200–950)
WBC: 6.7 10*3/uL (ref 3.8–10.8)

## 2018-02-01 LAB — COMPLETE METABOLIC PANEL WITH GFR
AG RATIO: 1.9 (calc) (ref 1.0–2.5)
ALBUMIN MSPROF: 4.4 g/dL (ref 3.6–5.1)
ALT: 14 U/L (ref 6–29)
AST: 19 U/L (ref 10–35)
Alkaline phosphatase (APISO): 19 U/L — ABNORMAL LOW (ref 33–130)
BUN: 18 mg/dL (ref 7–25)
CALCIUM: 10.1 mg/dL (ref 8.6–10.4)
CO2: 27 mmol/L (ref 20–32)
Chloride: 102 mmol/L (ref 98–110)
Creat: 0.78 mg/dL (ref 0.60–0.93)
GFR, EST AFRICAN AMERICAN: 87 mL/min/{1.73_m2} (ref >=60)
GFR, EST NON AFRICAN AMERICAN: 75 mL/min/{1.73_m2} (ref >=60)
GLOBULIN: 2.3 g/dL (ref 1.9–3.7)
Glucose, Bld: 144 mg/dL — ABNORMAL HIGH (ref 65–99)
POTASSIUM: 4.6 mmol/L (ref 3.5–5.3)
SODIUM: 141 mmol/L (ref 135–146)
Total Bilirubin: 0.5 mg/dL (ref 0.2–1.2)
Total Protein: 6.7 g/dL (ref 6.1–8.1)

## 2018-02-01 LAB — LIPID PANEL
CHOL/HDL RATIO: 3.9 (calc) (ref <5.0)
CHOLESTEROL: 178 mg/dL (ref <200)
HDL: 46 mg/dL — AB (ref >50)
LDL Cholesterol (Calc): 95 mg/dL (calc)
NON-HDL CHOLESTEROL (CALC): 132 mg/dL — AB (ref <130)
TRIGLYCERIDES: 245 mg/dL — AB (ref <150)

## 2018-02-01 LAB — HEMOGLOBIN A1C
HEMOGLOBIN A1C: 6.7 %{Hb} — AB (ref <5.7)
MEAN PLASMA GLUCOSE: 146 (calc)
eAG (mmol/L): 8.1 (calc)

## 2018-02-01 LAB — VITAMIN D 25 HYDROXY (VIT D DEFICIENCY, FRACTURES): Vit D, 25-Hydroxy: 29 ng/mL — ABNORMAL LOW (ref 30–100)

## 2018-02-02 ENCOUNTER — Encounter: Payer: Self-pay | Admitting: *Deleted

## 2018-02-02 MED ORDER — METFORMIN HCL ER 750 MG PO TB24: 750.0000 mg | ORAL_TABLET | Freq: Every day | ORAL | 0 refills | Status: DC

## 2018-02-06 ENCOUNTER — Encounter: Payer: Self-pay | Admitting: Family Medicine

## 2018-02-14 ENCOUNTER — Encounter: Payer: Self-pay | Admitting: Family Medicine

## 2018-02-14 ENCOUNTER — Ambulatory Visit (INDEPENDENT_AMBULATORY_CARE_PROVIDER_SITE_OTHER): Payer: Medicare Other | Admitting: Family Medicine

## 2018-02-14 VITALS — BP 110/74 | HR 87 | Temp 97.9°F | Resp 16 | Ht 63.0 in | Wt 163.5 lb

## 2018-02-14 DIAGNOSIS — E119 Type 2 diabetes mellitus without complications: Secondary | ICD-10-CM | POA: Diagnosis not present

## 2018-02-14 DIAGNOSIS — K589 Irritable bowel syndrome without diarrhea: Secondary | ICD-10-CM | POA: Diagnosis not present

## 2018-02-14 DIAGNOSIS — E785 Hyperlipidemia, unspecified: Secondary | ICD-10-CM

## 2018-02-14 DIAGNOSIS — E559 Vitamin D deficiency, unspecified: Secondary | ICD-10-CM | POA: Diagnosis not present

## 2018-02-14 DIAGNOSIS — G8929 Other chronic pain: Secondary | ICD-10-CM

## 2018-02-14 DIAGNOSIS — K219 Gastro-esophageal reflux disease without esophagitis: Secondary | ICD-10-CM | POA: Diagnosis not present

## 2018-02-14 DIAGNOSIS — M25562 Pain in left knee: Secondary | ICD-10-CM | POA: Diagnosis not present

## 2018-02-14 DIAGNOSIS — R197 Diarrhea, unspecified: Secondary | ICD-10-CM

## 2018-02-14 NOTE — Patient Instructions (Addendum)
  Continue protonix as needed  Keep doing extended release metformin and slow release magnesium, and come to do a fasting lab appointment in 3 months.  After results we will see if we need to have you come in for an OV

## 2018-02-14 NOTE — Assessment & Plan Note (Signed)
managed by ortho, abd pain/GERD better, continue PPI, sparingly use of NSAID

## 2018-02-14 NOTE — Assessment & Plan Note (Signed)
Slow release magnesium supplement better tolerated, no more severe, daily diarrhea Check mag level with next labs

## 2018-02-14 NOTE — Assessment & Plan Note (Signed)
Not addressed today - future labs 3 months

## 2018-02-14 NOTE — Assessment & Plan Note (Signed)
Improved on protonix 40 mg daily Continue for another 1-2 weeks and see if she can wean off, if not continue PPI daily and caution with NSAIDs

## 2018-02-14 NOTE — Assessment & Plan Note (Signed)
Improved with med changes as already notated

## 2018-02-14 NOTE — Progress Notes (Signed)
Patient ID: Jennifer Porter, female    DOB: 10-31-1945, 72 y.o.   MRN: [id number]  PCP: Orlena Sheldon, PA-C  Chief Complaint  Patient presents with  . Gastroesophageal Reflux  . Abdominal Pain  . Diarrhea  . Diabetes  . Knee Pain    Subjective:   Jennifer Porter is a 72 y.o. female, presents to clinic with CC of follow-up for multiple conditions with medication changes at routine visit 2 weeks ago.  She was having difficulty tolerating metformin was causing abdominal upset, diarrhea.  Her medication was changed to 750 mg extended release metformin and she has tolerated this medication change, she believes that her abdominal upset and diarrhea has improved because of the change to the medicine.  Her A1c is slightly increased from 3 months ago, was 6.5 is now 6.7.  She was taking magnesium supplement for hypomagnesia, this was causing severe diarrhea as well.  She was instructed to take a delayed release formula for magnesium supplementation and she has changed this as well and this is also decreased diarrhea.  She was having severe acid reflux and indigestion.  She has been taking Protonix for the past 2 weeks and she feels that her reflux is much better and became significantly better after taking the medication for 1 week.  She has some continued abdominal bloating and discomfort but overall her abdomen feels much better than it did a few weeks ago.  She has not stopped taking the Protonix or skipped any days.   He did hold NSAIDs that were prescribed for her for her chronic knee and back pain.  She states that she did not take this regularly and usually a 30-day supply would last her several months - diclofenac sodium CR 100 mg 24 hr tab.  She states that her knee pain is severe a few times over the last 2 weeks and tramadol made her feel ill and slightly dizzy so she would like to resume taking diclofenac as needed.    Patient Active Problem List   Diagnosis Date Noted  . Hormone  replacement therapy 01/12/2017  . Hot flashes due to menopause 01/12/2017  . Hypomagnesemia 06/27/2016  . Diarrhea 03/28/2016  . Vitamin D deficiency 10/03/2014  . Left knee pain 07/02/2014  . Hypertension   . GERD (gastroesophageal reflux disease)   . Diabetes mellitus without complication ([place])   . Allergy   . Asthma   . H/O: gout   . Hyperlipidemia     Current Meds  Medication Sig  . albuterol (PROVENTIL HFA;VENTOLIN HFA) 108 (90 Base) MCG/ACT inhaler Inhale 2 puffs into the lungs every 6 (six) hours as needed for wheezing or shortness of breath.  Marland Kitchen aspirin 81 MG tablet Take 81 mg by mouth daily.  . calcium carbonate (OS-CAL) 600 MG TABS tablet Take 600 mg by mouth daily with breakfast.  . cholecalciferol (VITAMIN D) 1000 units tablet Take 1,000 Units by mouth daily.  . Choline Fenofibrate (FENOFIBRIC ACID) 135 MG CPDR TAKE ONE CAPSULE BY MOUTH EVERY NIGHT AT BEDTIME  . fluticasone (FLONASE) 50 MCG/ACT nasal spray Place 2 sprays into both nostrils daily as needed.  . gabapentin (NEURONTIN) 300 MG capsule TAKE 1 CAPSULE BY MOUTH THREE TIMES A DAY  . lisinopril (PRINIVIL,ZESTRIL) 20 MG tablet TAKE ONE (1) TABLET BY MOUTH EVERY DAY  . metFORMIN (GLUCOPHAGE-XR) 750 MG 24 hr tablet Take 1 tablet (750 mg total) by mouth daily with breakfast.  . Multiple Vitamin (MULTIVITAMIN) tablet Take  1 tablet by mouth daily.  . pantoprazole (PROTONIX) 40 MG tablet Take 1 tablet (40 mg total) by mouth daily. One empty stomach  . pravastatin (PRAVACHOL) 10 MG tablet Take 1 tablet (10 mg total) by mouth at bedtime.  Marland Kitchen PREMARIN 0.625 MG tablet TAKE ONE (1) TABLET BY MOUTH EVERY DAY     Review of Systems  Constitutional: Negative.   HENT: Negative.   Eyes: Negative.   Respiratory: Negative.   Cardiovascular: Negative.   Gastrointestinal: Negative.   Endocrine: Negative.   Genitourinary: Negative.   Musculoskeletal: Negative.   Skin: Negative.   Allergic/Immunologic: Negative.   Neurological:  Negative.   Hematological: Negative.   Psychiatric/Behavioral: Negative.   All other systems reviewed and are negative.      Objective:    Vitals:   02/14/18 0802  Height: 5\' 3"  (1.6 m)      Physical Exam  Constitutional: She is oriented to person, place, and time. She appears well-developed and well-nourished.  Non-toxic appearance. She does not appear ill. No distress.  HENT:  Head: Normocephalic and atraumatic.  Right Ear: External ear normal.  Left Ear: External ear normal.  Nose: Nose normal.  Mouth/Throat: Uvula is midline, oropharynx is clear and moist and mucous membranes are normal.  Eyes: Pupils are equal, round, and reactive to light. Conjunctivae, EOM and lids are normal. No scleral icterus.  Neck: Normal range of motion and phonation normal. Neck supple. No tracheal deviation present.  Cardiovascular: Normal rate, regular rhythm, normal heart sounds, intact distal pulses and normal pulses. Exam reveals no gallop and no friction rub.  No murmur heard. Pulses:      Radial pulses are 2+ on the right side, and 2+ on the left side.  Pulmonary/Chest: Effort normal and breath sounds normal. No stridor. No respiratory distress. She has no wheezes. She has no rhonchi. She has no rales. She exhibits no tenderness.  Abdominal: Soft. Normal appearance and bowel sounds are normal. She exhibits no distension. There is no tenderness. There is no rebound and no guarding.  Musculoskeletal: Normal range of motion. She exhibits no edema or deformity.  Neurological: She is alert and oriented to person, place, and time. She exhibits normal muscle tone. Gait normal.  Skin: Skin is warm, dry and intact. Capillary refill takes less than 2 seconds. No rash noted. She is not diaphoretic. No pallor.  Psychiatric: She has a normal mood and affect. Her speech is normal and behavior is normal.  Nursing note and vitals reviewed.         Assessment & Plan:   72 y/o female returns for 2 week  recheck of DM, GERD/abdominal pain/diarrhea after med changes and PPI trial     Problem List Items Addressed This Visit      Digestive   GERD (gastroesophageal reflux disease) - Primary    Improved on protonix 40 mg daily Continue for another 1-2 weeks and see if she can wean off, if not continue PPI daily and caution with NSAIDs        Endocrine   Diabetes mellitus without complication (HCC)    Much better tolerance with 750 mg XR metformin F/up in 3 months and recheck A1C      Relevant Orders   COMPLETE METABOLIC PANEL WITH GFR   Hemoglobin A1c     Other   Hyperlipidemia    Not addressed today - future labs 3 months      Relevant Orders   Lipid panel   Left  knee pain    managed by ortho, abd pain/GERD better, continue PPI, sparingly use of NSAID      Vitamin D deficiency    Reviewed labs, near goal, continue supplement      Diarrhea    Improved with med changes as already notated      Hypomagnesemia    Slow release magnesium supplement better tolerated, no more severe, daily diarrhea Check mag level with next labs      Relevant Orders   Magnesium    Other Visit Diagnoses    Irritable bowel syndrome, unspecified type       Relevant Orders   COMPLETE METABOLIC PANEL WITH GFR   TSH      Pt has improved sx and SE after med changes.  She will continue PPI, see if she can wean off and if not she will continue PPI daily and we can discuss decreasing dose in the future. She did not tolerate tramadol and has severe chronic knee pain so she will resume taking diclofenac sparingly, does not take a daily.  Caution to follow-up or be rechecked if she has any worsening abdominal symptoms abdominal pain, melena, hematochezia that occurs when taking NSAIDs.  Patient will otherwise follow-up routinely in 3 months for her other chronic conditions and future lab work was ordered.    Delsa Grana, PA-C 02/14/18 8:07 AM

## 2018-02-14 NOTE — Assessment & Plan Note (Signed)
Reviewed labs, near goal, continue supplement

## 2018-02-14 NOTE — Assessment & Plan Note (Signed)
Much better tolerance with 750 mg XR metformin F/up in 3 months and recheck A1C

## 2018-03-19 DIAGNOSIS — M1712 Unilateral primary osteoarthritis, left knee: Secondary | ICD-10-CM | POA: Diagnosis not present

## 2018-03-22 ENCOUNTER — Encounter: Payer: Self-pay | Admitting: *Deleted

## 2018-03-29 DIAGNOSIS — Z1231 Encounter for screening mammogram for malignant neoplasm of breast: Secondary | ICD-10-CM | POA: Diagnosis not present

## 2018-03-29 LAB — HM MAMMOGRAPHY

## 2018-04-05 ENCOUNTER — Other Ambulatory Visit: Payer: Self-pay | Admitting: Family Medicine

## 2018-04-05 ENCOUNTER — Other Ambulatory Visit: Payer: Self-pay | Admitting: *Deleted

## 2018-04-05 MED ORDER — LISINOPRIL 20 MG PO TABS: ORAL_TABLET | ORAL | 0 refills | Status: DC

## 2018-04-07 ENCOUNTER — Encounter: Payer: Self-pay | Admitting: *Deleted

## 2018-04-17 ENCOUNTER — Other Ambulatory Visit: Payer: Self-pay | Admitting: Family Medicine

## 2018-04-23 ENCOUNTER — Other Ambulatory Visit: Payer: Self-pay | Admitting: Family Medicine

## 2018-04-30 ENCOUNTER — Other Ambulatory Visit: Payer: Self-pay | Admitting: Family Medicine

## 2018-04-30 DIAGNOSIS — E119 Type 2 diabetes mellitus without complications: Secondary | ICD-10-CM

## 2018-04-30 DIAGNOSIS — K219 Gastro-esophageal reflux disease without esophagitis: Secondary | ICD-10-CM

## 2018-06-05 ENCOUNTER — Telehealth: Payer: Self-pay | Admitting: Family Medicine

## 2018-06-05 DIAGNOSIS — M25562 Pain in left knee: Principal | ICD-10-CM

## 2018-06-05 DIAGNOSIS — G8929 Other chronic pain: Secondary | ICD-10-CM

## 2018-06-05 NOTE — Telephone Encounter (Signed)
Patient called in requesting a refill on her Diclofenac 100MG . It looks like it was discontinued on 01/31/18. Please advise?

## 2018-06-06 MED ORDER — DICLOFENAC SODIUM ER 100 MG PO TB24: 100.0000 mg | ORAL_TABLET | Freq: Every day | ORAL | 3 refills | Status: DC

## 2018-06-06 NOTE — Telephone Encounter (Signed)
Spoke with patient and informed her that refill was sent in also advised patient of recommendations and symptoms to look out for. Patient verbalized understanding.

## 2018-06-06 NOTE — Telephone Encounter (Signed)
I sent in a refill.  I would make sure to tell her that she can get back on or add on PPI later if it irritates her stomach or causes worsening GERD sx.  There are some other NSAIDs that might be better if it causes problems.  And please encourage her to contact us if she has any severe joint pain with new sx, or any concerning GI SE with restarting the diclofenac.   thanks

## 2018-06-06 NOTE — Addendum Note (Signed)
Addended by: Delsa Grana on: 06/06/2018 03:44 PM   Modules accepted: Orders

## 2018-06-19 DIAGNOSIS — M1712 Unilateral primary osteoarthritis, left knee: Secondary | ICD-10-CM | POA: Diagnosis not present

## 2018-07-05 ENCOUNTER — Ambulatory Visit (INDEPENDENT_AMBULATORY_CARE_PROVIDER_SITE_OTHER): Payer: Medicare Other | Admitting: Family Medicine

## 2018-07-05 ENCOUNTER — Encounter: Payer: Self-pay | Admitting: Family Medicine

## 2018-07-05 ENCOUNTER — Other Ambulatory Visit: Payer: Self-pay

## 2018-07-05 VITALS — Ht 64.0 in | Wt 159.0 lb

## 2018-07-05 DIAGNOSIS — J302 Other seasonal allergic rhinitis: Secondary | ICD-10-CM | POA: Diagnosis not present

## 2018-07-05 DIAGNOSIS — J0141 Acute recurrent pansinusitis: Secondary | ICD-10-CM | POA: Diagnosis not present

## 2018-07-05 MED ORDER — FLUTICASONE PROPIONATE 50 MCG/ACT NA SUSP: 2.0000 | Freq: Every day | NASAL | 5 refills | Status: DC | PRN

## 2018-07-05 MED ORDER — AMOXICILLIN-POT CLAVULANATE 875-125 MG PO TABS: 1.0000 | ORAL_TABLET | Freq: Two times a day (BID) | ORAL | 0 refills | Status: AC

## 2018-07-05 MED ORDER — CETIRIZINE HCL 10 MG PO TBDP: 10.0000 mg | ORAL_TABLET | Freq: Every day | ORAL | 0 refills | Status: DC

## 2018-07-05 NOTE — Progress Notes (Signed)
Virtual Visit via Telephone Note  Phone visit arranged with@ for 07/05/18 at  9:30 AM EDT  Services provided today were via telemedicine through telephone call. Start of phone call:  10:05 AM  I verified that I was speaking with the correct person using two identifiers. Patient reported their location during encounter was home  Patient consented to telephone visit  I conducted telephone visit from [place] [facility]  Referring Provider:   Delsa Grana, PA-C   All participants in encounter:  Myself and patient I discussed the limitations, risks, security and privacy concerns of performing an evaluation and management service by telephone and the availability of in person appointments. I also discussed with the patient that there may be a patient responsible charge related to this service. The patient expressed understanding and agreed to proceed.   History of Present Illness: Pt has recurrent seasonal allergies brought on by pollen, started with sneezing, runny nose, itchy watery eyes than now has improved somewhat but now has severe sinus pain and pressure She gets at least once a year.  In the past nose spray helps and antibiotics help - believes she usually gets amoxicillin or augmentin. She also is not doing any allergy meds because she's afraid of what she should not take with her BP meds Pain gradually started after severe allergies over a week ago, pain is in forehead, eyes, cheeks, intermittently in ears.  She had a few days of intermittent mild post-nasal drip that made her throat scratchy but she currently denies sore throat, cough, chest congestion, SOB, CP, wheeze.  Sinusitis  This is a recurrent problem. The current episode started in the past 7 days. The problem has been gradually worsening since onset. There has been no fever. The pain is severe. Associated symptoms include congestion, ear pain, headaches, sinus pressure and sneezing. Pertinent negatives  include no chills, coughing, diaphoresis, hoarse voice, neck pain, shortness of breath, sore throat or swollen glands. Past treatments include nothing.         Observations/Objective: Vitals:   07/05/18 1021  Weight: 159 lb (72.1 kg)  Height: 5\' 4"  (1.626 m)   Pt reports afebrile Objective limited by telephone encounter Pt alert, answering questions appropriately, normal phonation, no audible wheeze or stridor, speaking in complete sentences  Assessment and Plan:   ICD-10-CM   1. Acute recurrent pansinusitis J01.41    7+ days of sx with acute worsening, tx for ABS with augmentin, f/up if not improving  2. Seasonal allergies J30.2    resume OTC nasal steroid spray and 2nd gen antihistamine like zyrtec 10 mg daily at bedtime     Follow Up Instructions: F/up PRN (in 5-7d) if not gradually improving   I discussed the assessment and treatment plan with the patient. The patient was provided an opportunity to ask questions and all were answered. The patient agreed with the plan and demonstrated an understanding of the instructions.   The patient was advised to call back or seek an in-person evaluation if the symptoms worsen or if the condition fails to improve as anticipated.    Phone call concluded at 10:19 AM  I provided 14 minutes of non-face-to-face time during this encounter.   Delsa Grana, PA-C

## 2018-07-16 ENCOUNTER — Other Ambulatory Visit: Payer: Self-pay | Admitting: Family Medicine

## 2018-07-16 DIAGNOSIS — E785 Hyperlipidemia, unspecified: Secondary | ICD-10-CM

## 2018-07-24 ENCOUNTER — Encounter: Payer: Self-pay | Admitting: Family Medicine

## 2018-07-24 ENCOUNTER — Other Ambulatory Visit: Payer: Self-pay

## 2018-07-24 ENCOUNTER — Ambulatory Visit (INDEPENDENT_AMBULATORY_CARE_PROVIDER_SITE_OTHER): Payer: Medicare Other | Admitting: Family Medicine

## 2018-07-24 DIAGNOSIS — K219 Gastro-esophageal reflux disease without esophagitis: Secondary | ICD-10-CM

## 2018-07-24 MED ORDER — PANTOPRAZOLE SODIUM 40 MG PO TBEC: 40.0000 mg | DELAYED_RELEASE_TABLET | Freq: Two times a day (BID) | ORAL | 2 refills | Status: DC

## 2018-07-24 NOTE — Progress Notes (Signed)
Patient ID: Jennifer Porter, female    DOB: Jan 01, 1946, 73 y.o.   MRN: [id number]  PCP: Delsa Grana, PA-C  Virtual Visit via telephone  Phone visit arranged with [name] for 07/24/18 at  9:30 AM EDT  Services provided today were via telemedicine through telephone call. Start of phone call:  10:07 AM  I verified that I was speaking with the correct person using two identifiers. Patient reported their location during encounter was at home   Patient consented to telephone visit  I conducted telephone visit from [place] [facility]  Referring Provider:   Delsa Grana, PA-C   All participants in encounter:  Myself and the patient   I discussed the limitations, risks, security and privacy concerns of performing an evaluation and management service by telephone and the availability of in person appointments. I also discussed with the patient that there may be a patient responsible charge related to this service. The patient expressed understanding and agreed to proceed.  Chief Complaint  Patient presents with  . Abdominal Pain    Subjective:   Jennifer Porter is a 73 y.o. female, with CC of abdominal pain described as burning and reflux, started 3 days ago.  Located in [place] with radiation up chest to neck and some radiation to back, pain moderate severity, constant, consistently described as a burning.  She has taken protonix intermittently over the past week.   Last 90 d supply of protonox was about 3 months ago and she tried to wean off of it her GERD worsened.  Vomited several times of 3 days ago was "pure acid."   No vomiting since, no D, no sick contacts. Denies associated fever, chills, sweats, HA, URI sx, respiratory sx, rash.    Patient Active Problem List   Diagnosis Date Noted  . Hormone replacement therapy 01/12/2017  . Hot flashes due to menopause 01/12/2017  . Hypomagnesemia 06/27/2016  . Diarrhea 03/28/2016  . Vitamin D deficiency  10/03/2014  . Left knee pain 07/02/2014  . Hypertension   . GERD (gastroesophageal reflux disease)   . Diabetes mellitus without complication ([place])   . Allergy   . Asthma   . H/O: gout   . Hyperlipidemia     Prior to Admission medications   Medication Sig Start Date End Date Taking? Authorizing Provider  albuterol (PROVENTIL HFA;VENTOLIN HFA) 108 (90 Base) MCG/ACT inhaler Inhale 2 puffs into the lungs every 6 (six) hours as needed for wheezing or shortness of breath. 05/17/16   Alycia Rossetti, MD  aspirin 81 MG tablet Take 81 mg by mouth daily.    [provider]  calcium carbonate (OS-CAL) 600 MG TABS tablet Take 600 mg by mouth daily with breakfast.    [provider]  Cetirizine HCl (ZYRTEC ALLERGY) 10 MG TBDP Take 10 mg by mouth at bedtime. 07/05/18   Delsa Grana, PA-C  cholecalciferol (VITAMIN D) 1000 units tablet Take 1,000 Units by mouth daily.    [provider]  Choline Fenofibrate (FENOFIBRIC ACID) 135 MG CPDR TAKE ONE CAPSULE BY MOUTH EVERY NIGHT AT BEDTIME 07/16/18   Delsa Grana, PA-C  Diclofenac Sodium CR 100 MG 24 hr tablet Take 1 tablet (100 mg total) by mouth daily. Hold and contact PCP with any GI upset 06/06/18   Delsa Grana, PA-C  fluticasone (FLONASE) 50 MCG/ACT nasal spray Place 2 sprays into both nostrils daily as needed. 07/05/18   Delsa Grana, PA-C  gabapentin (NEURONTIN) 300 MG  capsule TAKE 1 CAPSULE BY MOUTH THREE TIMES A DAY 07/20/17   Dixon, Stanton Kidney B, PA-C  lisinopril (PRINIVIL,ZESTRIL) 20 MG tablet TAKE ONE (1) TABLET BY MOUTH EVERY DAY 04/06/18   Delsa Grana, PA-C  metFORMIN (GLUCOPHAGE-XR) 750 MG 24 hr tablet TAKE ONE TABLET (750MG  TOTAL) BY MOUTH DAILY WITH BREAKFAST 04/30/18   Delsa Grana, PA-C  Multiple Vitamin (MULTIVITAMIN) tablet Take 1 tablet by mouth daily.    [provider]  pantoprazole (PROTONIX) 40 MG tablet TAKE ONE TABLET (40MG  TOTAL) BY MOUTH DAILY ON AN EMPTY STOMACH 04/30/18   Delsa Grana, PA-C  pravastatin  (PRAVACHOL) 10 MG tablet TAKE ONE TABLET (10MG  TOTAL) BY MOUTH ATBEDTIME 07/16/18   Delsa Grana, PA-C  PREMARIN 0.625 MG tablet TAKE ONE (1) TABLET BY MOUTH EVERY DAY 07/16/18   Delsa Grana, PA-C    Allergies  Allergen Reactions  . Codeine Nausea And Vomiting  . Macrobid [Nitrofurantoin] Hives  . Simvastatin     headache    Review of Systems  Constitutional: Negative.   HENT: Negative.   Eyes: Negative.   Respiratory: Negative.   Cardiovascular: Negative.   Gastrointestinal: Negative.   Endocrine: Negative.   Genitourinary: Negative.   Musculoskeletal: Negative.   Skin: Negative.   Allergic/Immunologic: Negative.   Neurological: Negative.   Hematological: Negative.   Psychiatric/Behavioral: Negative.   All other systems reviewed and are negative.      Objective:    There were no vitals filed for this visit.  99.0 temp oral - per pt 159 lbs - no change to weight  Physical Exam  Limited due to telephone encounter Pt able to speak in complete sentences without audible wheeze or stridor, no coughing, alert and pleasant.     Assessment & Plan:     ICD-10-CM   1. Gastroesophageal reflux disease, esophagitis presence not specified K21.9 pantoprazole (PROTONIX) 40 MG tablet   restart protonix BID for the next 1-2 weeks then can try decrease to daily again, lifestyle/foods reviewed, f/up if not improving in 1 week     Encouraged her to f/up sooner if any worsening ER precautions reviewed at length with pt and she verbalized understanding.  I discussed the assessment and treatment plan with the patient. The patient was provided an opportunity to ask questions and all were answered. The patient agreed with the plan and demonstrated an understanding of the instructions.   The patient was advised to call back or seek an in-person evaluation if the symptoms worsen or if the condition fails to improve as anticipated.  Phone call concluded at 10:19 AM  I provided 12 minutes  of non-face-to-face time during this encounter.  Delsa Grana, PA-C 07/24/18 10:07 AM

## 2018-07-30 ENCOUNTER — Other Ambulatory Visit: Payer: Self-pay | Admitting: Family Medicine

## 2018-07-30 DIAGNOSIS — E119 Type 2 diabetes mellitus without complications: Secondary | ICD-10-CM

## 2018-08-01 ENCOUNTER — Ambulatory Visit (HOSPITAL_COMMUNITY)
Admission: RE | Admit: 2018-08-01 | Discharge: 2018-08-01 | Disposition: A | Discharge status: Home / Self Care | Payer: Medicare Other | Source: Ambulatory Visit | Attending: Family Medicine | Admitting: Family Medicine

## 2018-08-01 ENCOUNTER — Encounter: Payer: Self-pay | Admitting: Family Medicine

## 2018-08-01 ENCOUNTER — Other Ambulatory Visit: Payer: Self-pay

## 2018-08-01 ENCOUNTER — Ambulatory Visit (INDEPENDENT_AMBULATORY_CARE_PROVIDER_SITE_OTHER): Payer: Medicare Other | Admitting: Family Medicine

## 2018-08-01 VITALS — BP 128/70 | HR 86 | Temp 97.7°F | Resp 18 | Ht 64.0 in | Wt 157.2 lb

## 2018-08-01 DIAGNOSIS — R079 Chest pain, unspecified: Secondary | ICD-10-CM

## 2018-08-01 DIAGNOSIS — R110 Nausea: Secondary | ICD-10-CM | POA: Insufficient documentation

## 2018-08-01 DIAGNOSIS — N200 Calculus of kidney: Secondary | ICD-10-CM | POA: Diagnosis not present

## 2018-08-01 DIAGNOSIS — J310 Chronic rhinitis: Secondary | ICD-10-CM

## 2018-08-01 DIAGNOSIS — N281 Cyst of kidney, acquired: Secondary | ICD-10-CM | POA: Diagnosis not present

## 2018-08-01 DIAGNOSIS — J329 Chronic sinusitis, unspecified: Secondary | ICD-10-CM

## 2018-08-01 DIAGNOSIS — R109 Unspecified abdominal pain: Secondary | ICD-10-CM | POA: Insufficient documentation

## 2018-08-01 DIAGNOSIS — R1084 Generalized abdominal pain: Secondary | ICD-10-CM | POA: Diagnosis not present

## 2018-08-01 DIAGNOSIS — K219 Gastro-esophageal reflux disease without esophagitis: Secondary | ICD-10-CM

## 2018-08-01 DIAGNOSIS — K859 Acute pancreatitis without necrosis or infection, unspecified: Secondary | ICD-10-CM

## 2018-08-01 DIAGNOSIS — E119 Type 2 diabetes mellitus without complications: Secondary | ICD-10-CM

## 2018-08-01 DIAGNOSIS — R7309 Other abnormal glucose: Secondary | ICD-10-CM | POA: Diagnosis not present

## 2018-08-01 DIAGNOSIS — K5730 Diverticulosis of large intestine without perforation or abscess without bleeding: Secondary | ICD-10-CM | POA: Diagnosis not present

## 2018-08-01 LAB — URINALYSIS, ROUTINE W REFLEX MICROSCOPIC
Bilirubin Urine: NEGATIVE
Glucose, UA: NEGATIVE
Hgb urine dipstick: NEGATIVE
Ketones, ur: NEGATIVE
Leukocytes,Ua: NEGATIVE
Nitrite: NEGATIVE
Protein, ur: NEGATIVE
Specific Gravity, Urine: 1.02 (ref 1.001–1.03)
pH: 7 (ref 5.0–8.0)

## 2018-08-01 MED ORDER — SUCRALFATE 1 G PO TABS: 1.0000 g | ORAL_TABLET | Freq: Three times a day (TID) | ORAL | 1 refills | Status: DC

## 2018-08-01 NOTE — Progress Notes (Signed)
Patient ID: Jennifer Porter, female    DOB: 03/05/1946, 73 y.o.   MRN: [id number]  PCP: Delsa Grana, PA-C  Chief Complaint  Patient presents with  . Chest Pain    sharp, not constant, pain radiates around to back    Subjective:   Jennifer Porter is a 73 y.o. female, presents to clinic with CC of epigastric pain radiates to left upper abdomen with radiation to back.  Onset of pain was over 2 weeks ago.  I previously did a telephone visit with her about 8 days ago.  Pain has been consistent since then but changed a little bit.  She is concerned since there was epigastric tenderness radiating up her chest that has seemed to improve a little bit with restarting Protonix but she does have epigastric pain radiating to the left upper quadrant also radiating to her back and flank.  She describes the pain as sharp and intermittent, is very severe at times.  Pain does seem to get worse with eating.  Is associated with nausea, indigestion, bloating across her upper abdomen.  Her reflux and burning symptoms that were rating up her central chest to her neck have improved but not completely resolved.  The pain is more severe in the epigastrium and left upper quadrant than it had been previously.  8 days ago it was not radiating to her back and now it is.  She wanted to get checked out in person to make sure was not anything in her chest her heart or her lungs. To summarize last encounter with her she was having some nausea had 1 day of vomiting which she describes as acid which started severe indigestion reflux symptoms, pain with eating, nausea and decreased appetite.   She is still not eating very much since onset of her pain.  She denies any recurrent vomiting.  She also notes that she is not drinking very much, when she does she drinks water and sweet tea, she is avoiding anything acidic spicy, carbonated.  Pain does get worse with eating anything she has tried to eat much smaller amounts of food and bland  food such as hard-boiled eggs, small sandwiches, toast or applesauce.   She is propping herself up at night denies any changed pain with different positional changes but in laying her down on the exam table does seem to be slightly more uncomfortable when laying on her back.   Her symptoms started after being treated with antibiotics for sinusitis, she did take Augmentin.  He continues to have congestion and drainage in her sinuses, that makes her feel like she is having difficulty breathing but she specifies that the nasal congestion which makes her feel that way she has no shortness of breath, wheeze, tightness or pain with breathing.  She has no pain anywhere else in her chest other than when she has reflux.  She denies any palpitations, chest pressure, exertional symptoms, near syncope.  She does feel generally weak.  She denies any change in her stool states she is having normal solid stools.  Denies any diarrhea, melena, hematochezia.  Denies any urinary symptoms or vaginal symptoms.  She states her urine has been a normal color light yellow and clear.   Reviewed her medical history with her and updated aspects of the chart -specifically reviewed surgical history: Abdominal/pelvic surgeries s/p cholecystectomy, appendectomy, TAH BSO Hx diverticulitis a "long time ago" Hx of GERD and hiatal hernia that she states a surgeon reported to her that  he fixed when he was taking out her gallbladder She denies any history of diverticulitis, denies ever drinking alcohol She states that her last colonoscopy was 11 years ago and most recently, January of this year she had a "house call" from insurance company that comes out once a year to her house and they did a stool test.  I reviewed available records in chart/media tab - does not state what test was done and do not have copy of lab results to verify.  Oldest sister died colon cancer -died 3 years ago, she had not done any screening at that point and there is  no other family history of colon cancer. Father had MI and mother had DM and strokes.     Patient Active Problem List   Diagnosis Date Noted  . Hormone replacement therapy 01/12/2017  . Hot flashes due to menopause 01/12/2017  . Hypomagnesemia 06/27/2016  . Diarrhea 03/28/2016  . Vitamin D deficiency 10/03/2014  . Left knee pain 07/02/2014  . Hypertension   . GERD (gastroesophageal reflux disease)   . Diabetes mellitus without complication ([place])   . Allergy   . Asthma   . H/O: gout   . Hyperlipidemia      Prior to Admission medications   Medication Sig Start Date End Date Taking? Authorizing Provider  albuterol (PROVENTIL HFA;VENTOLIN HFA) 108 (90 Base) MCG/ACT inhaler Inhale 2 puffs into the lungs every 6 (six) hours as needed for wheezing or shortness of breath. 05/17/16  Yes Howard, Modena Nunnery, MD  aspirin 81 MG tablet Take 81 mg by mouth daily.   Yes [provider]  calcium carbonate (OS-CAL) 600 MG TABS tablet Take 600 mg by mouth daily with breakfast.   Yes [provider]  Cetirizine HCl (ZYRTEC ALLERGY) 10 MG TBDP Take 10 mg by mouth at bedtime. 07/05/18  Yes Delsa Grana, PA-C  cholecalciferol (VITAMIN D) 1000 units tablet Take 1,000 Units by mouth daily.   Yes [provider]  Choline Fenofibrate (FENOFIBRIC ACID) 135 MG CPDR TAKE ONE CAPSULE BY MOUTH EVERY NIGHT AT BEDTIME 07/16/18  Yes Delsa Grana, PA-C  Diclofenac Sodium CR 100 MG 24 hr tablet Take 1 tablet (100 mg total) by mouth daily. Hold and contact PCP with any GI upset 06/06/18  Yes Delsa Grana, PA-C  fluticasone (FLONASE) 50 MCG/ACT nasal spray Place 2 sprays into both nostrils daily as needed. 07/05/18  Yes Delsa Grana, PA-C  gabapentin (NEURONTIN) 300 MG capsule TAKE 1 CAPSULE BY MOUTH THREE TIMES A DAY 07/20/17  Yes Dixon, Mary B, PA-C  lisinopril (PRINIVIL,ZESTRIL) 20 MG tablet TAKE ONE (1) TABLET BY MOUTH EVERY DAY 04/06/18  Yes Delsa Grana, PA-C  metFORMIN (GLUCOPHAGE-XR) 750 MG 24  hr tablet TAKE ONE (1) TABLET BY MOUTH EVERY DAY 07/30/18  Yes Delsa Grana, PA-C  Multiple Vitamin (MULTIVITAMIN) tablet Take 1 tablet by mouth daily.   Yes [provider]  pantoprazole (PROTONIX) 40 MG tablet Take 1 tablet (40 mg total) by mouth 2 (two) times daily. 07/24/18  Yes Delsa Grana, PA-C  pravastatin (PRAVACHOL) 10 MG tablet TAKE ONE TABLET (10MG  TOTAL) BY MOUTH ATBEDTIME 07/16/18  Yes Delsa Grana, PA-C  PREMARIN 0.625 MG tablet TAKE ONE (1) TABLET BY MOUTH EVERY DAY 07/16/18  Yes Delsa Grana, PA-C     Allergies  Allergen Reactions  . Codeine Nausea And Vomiting  . Macrobid [Nitrofurantoin] Hives  . Simvastatin     headache     Family History  Problem Relation Age of  Onset  . Stroke Mother   . Diabetes Brother   . Cancer Neg Hx   . Heart disease Neg Hx      Social History   Socioeconomic History  . Marital status: Single    Spouse name: Not on file  . Number of children: Not on file  . Years of education: Not on file  . Highest education level: Not on file  Occupational History  . Not on file  Social Needs  . Financial resource strain: Not on file  . Food insecurity:    Worry: Not on file    Inability: Not on file  . Transportation needs:    Medical: Not on file    Non-medical: Not on file  Tobacco Use  . Smoking status: Never Smoker  . Smokeless tobacco: Never Used  Substance and Sexual Activity  . Alcohol use: No  . Drug use: No  . Sexual activity: Not on file  Lifestyle  . Physical activity:    Days per week: Not on file    Minutes per session: Not on file  . Stress: Not on file  Relationships  . Social connections:    Talks on phone: Not on file    Gets together: Not on file    Attends religious service: Not on file    Active member of club or organization: Not on file    Attends meetings of clubs or organizations: Not on file    Relationship status: Not on file  . Intimate partner violence:    Fear of current or ex partner: Not  on file    Emotionally abused: Not on file    Physically abused: Not on file    Forced sexual activity: Not on file  Other Topics Concern  . Not on file  Social History Narrative   Retired March 2014.   Did teach Kindergarten at [employer]     Review of Systems  Constitutional: Positive for appetite change. Negative for activity change, chills, diaphoresis, fatigue and fever.  HENT: Positive for congestion, ear pain, postnasal drip, rhinorrhea, sinus pressure and sinus pain. Negative for sneezing, sore throat and trouble swallowing.   Eyes: Negative.   Respiratory: Negative.  Negative for cough, choking, chest tightness, shortness of breath, wheezing and stridor.   Cardiovascular: Negative for palpitations and leg swelling.  Gastrointestinal: Positive for abdominal pain and nausea. Negative for blood in stool, constipation, diarrhea and vomiting.  Endocrine: Negative.   Genitourinary: Negative.  Negative for decreased urine volume, difficulty urinating, frequency, hematuria, pelvic pain and urgency.  Musculoskeletal: Negative.   Skin: Negative.  Negative for color change, pallor and rash.  Allergic/Immunologic: Negative.   Neurological: Negative.  Negative for dizziness, tremors, syncope, light-headedness and headaches.  Hematological: Negative.   Psychiatric/Behavioral: Negative.   All other systems reviewed and are negative.      Objective:    Vitals:   08/01/18 1044  BP: 128/70  Pulse: 86  Resp: 18  Temp: 97.7 F (36.5 C)  SpO2: 98%  Weight: 157 lb 3.2 oz (71.3 kg)  Height: 5\' 4"  (1.626 m)      Physical Exam Vitals signs and nursing note reviewed.  Constitutional:      General: She is not in acute distress.    Appearance: Normal appearance. She is well-developed. She is not ill-appearing, toxic-appearing or diaphoretic.  HENT:     Head: Normocephalic and atraumatic.     Right Ear: Hearing, tympanic membrane, ear canal and external ear normal.  No  tenderness. No middle ear effusion. Tympanic membrane is not erythematous or bulging.     Left Ear: Hearing, tympanic membrane, ear canal and external ear normal. No tenderness.  No middle ear effusion. Tympanic membrane is not erythematous or bulging.     Nose:     Right Sinus: Maxillary sinus tenderness and frontal sinus tenderness present.     Left Sinus: Maxillary sinus tenderness and frontal sinus tenderness present.     Comments: Nasal mucosa diffusely erythematous and raw appearing    Mouth/Throat:     Pharynx: Uvula midline.  Eyes:     General: Lids are normal.     Conjunctiva/sclera: Conjunctivae normal.     Pupils: Pupils are equal, round, and reactive to light.  Neck:     Musculoskeletal: Normal range of motion and neck supple.     Trachea: Phonation normal. No tracheal deviation.  Cardiovascular:     Rate and Rhythm: Normal rate and regular rhythm.  No extrasystoles are present.    Chest Wall: PMI is not displaced.     Pulses: Normal pulses.          Radial pulses are 2+ on the right side and 2+ on the left side.       Posterior tibial pulses are 2+ on the right side and 2+ on the left side.     Heart sounds: Normal heart sounds. Heart sounds not distant. No murmur. No friction rub. No gallop.   Pulmonary:     Effort: Pulmonary effort is normal. No tachypnea, accessory muscle usage or respiratory distress.     Breath sounds: Normal breath sounds. No stridor. No decreased breath sounds, wheezing, rhonchi or rales.  Chest:     Chest wall: No tenderness.  Abdominal:     General: Bowel sounds are normal. There is no distension or abdominal bruit.     Palpations: Abdomen is soft. There is no hepatomegaly or mass.     Tenderness: There is abdominal tenderness in the epigastric area and left upper quadrant. There is guarding. There is no right CVA tenderness, left CVA tenderness or rebound.     Comments: Obese soft abdomen  Genitourinary:    Comments: Pt refused DRE/hemoccult  Musculoskeletal: Normal range of motion.        General: No deformity.     Right lower leg: No edema.     Left lower leg: No edema.  Lymphadenopathy:     Cervical: No cervical adenopathy.  Skin:    General: Skin is warm and dry.     Capillary Refill: Capillary refill takes less than 2 seconds.     Coloration: Skin is not cyanotic or pale.     Findings: No erythema or rash.     Nails: There is no clubbing.   Neurological:     General: No focal deficit present.     Mental Status: She is alert and oriented to person, place, and time.     Sensory: Sensation is intact.     Motor: No tremor or abnormal muscle tone.     Coordination: Coordination is intact.     Gait: Gait normal.     Comments: Mild generalized weakness with getting up from chair in room to climbing up onto exam table, did not require assistance, just moves slowly and carefully Steady gait 5/5 b/l grip strength, dorsiflexion and plantarflexion  Psychiatric:        Mood and Affect: Mood normal.  Speech: Speech normal.        Behavior: Behavior normal.       EKG:  NSR, rate 91, left axis, no ST elevation or depression     Assessment & Plan:      ICD-10-CM   1. Chest pain, unspecified type R07.9 EKG 12-Lead   EKG done to reassure pt, cardiac and pulm exam normal, EKG unremarkable, hx of pain suspicious for GI, doubt cardiac/ACS  2. Generalized abdominal pain R10.84 CT Abdomen Pelvis Wo Contrast    Urinalysis, Routine w reflex microscopic    CBC with Differential/Platelet    COMPLETE METABOLIC PANEL WITH GFR    Lipase   dx from ordering CT - see other abd pain below  3. Abdominal pain, unspecified abdominal location R10.9 CT Abdomen Pelvis Wo Contrast    Urinalysis, Routine w reflex microscopic    CBC with Differential/Platelet    COMPLETE METABOLIC PANEL WITH GFR    Lipase    Fecal Globin By Immunochemistry   epigastrum to LUQ, ddx gastritis, gastric ulcer, PUD, pancreatitis, colitis, tender on exam -  CT, labs obtained  4. Nausea R11.0 CT Abdomen Pelvis Wo Contrast    Urinalysis, Routine w reflex microscopic    CBC with Differential/Platelet    COMPLETE METABOLIC PANEL WITH GFR    Lipase  5. Diabetes mellitus without complication ([facility]) Q11.9 Hemoglobin A1c  6. Rhinosinusitis J32.9    erythematous nasal mucosa, sinus ttp, symptomatic - tx again with abx?  considering cefdinir     Pt with more than 2 weeks epigastric pain, n, decreased appetite, indigestion, bloating.  Some of the reflux and burning improved with restarting protonix, but she did not take the way I prescribed with last phone visit, instructed to take BID, and she is taking in the AM.  Pain is epigastric to LUQ radiating to her back, she is ttp on exam, non-distended, with + BSx4.  She is weak and moved around exam room slowly.  She refused DRE for hemoccult.  Ordered CT abd to r/o colitis/pancreatitis.  Still may be gastritis or PUD.  Strongly urged her to increase PPI to BID, add carafate, use OTC pepcid/maalox/mylanta for pain.   Increase fluids and diet as tolerated.  She denies any diarrhea, melena, hematochezia, urinary sx. She is generally weak and tired, denies SOB, cough, palpitations.  No CP currently while in exam room, but EKG was done since this was one of her concerns that it may be cardiac or her lungs.  She had RRR, no M, G, R, lungs CTA A&P.  She has signs of continued URI/sinusitis, hesitate to tx with antibiotics right now until abdominal pain is dx or at least improving.    Sent to [facility] for stat CT. Labs done but will not be resulted until tomorrow. [place] refused in office, take home hemoccult test ordered per pt preference - explained it would rule out occult bleeding from ulcer/gastritis, but she continued to refuse.  She did not have any other signs/sx of anemia, and I suspect her weakness/fatigue is from decreased PO intake. UA done here unremarkable.  Results for orders placed or performed in  visit on 08/01/18  Urinalysis, Routine w reflex microscopic  Result Value Ref Range   Color, Urine YELLOW YELLOW   APPearance CLEAR CLEAR   Specific Gravity, Urine 1.020 1.001 - 1.03   pH 7.0 5.0 - 8.0   Glucose, UA NEGATIVE NEGATIVE   Bilirubin Urine NEGATIVE NEGATIVE   Ketones, ur NEGATIVE NEGATIVE  Hgb urine dipstick NEGATIVE NEGATIVE   Protein, ur NEGATIVE NEGATIVE   Nitrite NEGATIVE NEGATIVE   Leukocytes,Ua NEGATIVE NEGATIVE   UA neg  Delsa Grana, PA-C 08/01/18 2:47 PM

## 2018-08-02 DIAGNOSIS — R109 Unspecified abdominal pain: Secondary | ICD-10-CM | POA: Diagnosis not present

## 2018-08-02 LAB — HEMOGLOBIN A1C
Hgb A1c MFr Bld: 7.5 % of total Hgb — ABNORMAL HIGH (ref <5.7)
Mean Plasma Glucose: 169 (calc)
eAG (mmol/L): 9.3 (calc)

## 2018-08-02 LAB — CBC WITH DIFFERENTIAL/PLATELET
Absolute Monocytes: 533 cells/uL (ref 200–950)
Basophils Absolute: 98 cells/uL (ref 0–200)
Basophils Relative: 1.3 %
Eosinophils Absolute: 210 cells/uL (ref 15–500)
Eosinophils Relative: 2.8 %
HCT: 38.4 % (ref 35.0–45.0)
Hemoglobin: 13.3 g/dL (ref 11.7–15.5)
Lymphs Abs: 1860 cells/uL (ref 850–3900)
MCH: 31.6 pg (ref 27.0–33.0)
MCHC: 34.6 g/dL (ref 32.0–36.0)
MCV: 91.2 fL (ref 80.0–100.0)
MPV: 11 fL (ref 7.5–12.5)
Monocytes Relative: 7.1 %
Neutro Abs: 4800 cells/uL (ref 1500–7800)
Neutrophils Relative %: 64 %
Platelets: 345 10*3/uL (ref 140–400)
RBC: 4.21 10*6/uL (ref 3.80–5.10)
RDW: 12.1 % (ref 11.0–15.0)
Total Lymphocyte: 24.8 %
WBC: 7.5 10*3/uL (ref 3.8–10.8)

## 2018-08-02 LAB — COMPLETE METABOLIC PANEL WITH GFR
AG Ratio: 1.8 (calc) (ref 1.0–2.5)
ALT: 13 U/L (ref 6–29)
AST: 17 U/L (ref 10–35)
Albumin: 4.3 g/dL (ref 3.6–5.1)
Alkaline phosphatase (APISO): 17 U/L — ABNORMAL LOW (ref 37–153)
BUN: 19 mg/dL (ref 7–25)
CO2: 25 mmol/L (ref 20–32)
Calcium: 9.7 mg/dL (ref 8.6–10.4)
Chloride: 103 mmol/L (ref 98–110)
Creat: 0.79 mg/dL (ref 0.60–0.93)
GFR, Est African American: 86 mL/min/{1.73_m2} (ref >=60)
GFR, Est Non African American: 74 mL/min/{1.73_m2} (ref >=60)
Globulin: 2.4 g/dL (calc) (ref 1.9–3.7)
Glucose, Bld: 163 mg/dL — ABNORMAL HIGH (ref 65–99)
Potassium: 4.6 mmol/L (ref 3.5–5.3)
Sodium: 140 mmol/L (ref 135–146)
Total Bilirubin: 0.4 mg/dL (ref 0.2–1.2)
Total Protein: 6.7 g/dL (ref 6.1–8.1)

## 2018-08-02 LAB — TEST AUTHORIZATION

## 2018-08-02 LAB — LIPID PANEL
Cholesterol: 167 mg/dL (ref <200)
HDL: 49 mg/dL — ABNORMAL LOW (ref >=50)
LDL Cholesterol (Calc): 91 mg/dL (calc)
Non-HDL Cholesterol (Calc): 118 mg/dL (calc) (ref <130)
Total CHOL/HDL Ratio: 3.4 (calc) (ref <5.0)
Triglycerides: 167 mg/dL — ABNORMAL HIGH (ref <150)

## 2018-08-02 LAB — LIPASE: Lipase: 340 U/L — ABNORMAL HIGH (ref 7–60)

## 2018-08-02 MED ORDER — ONDANSETRON 4 MG PO TBDP: 4.0000 mg | ORAL_TABLET | Freq: Three times a day (TID) | ORAL | 0 refills | Status: DC | PRN

## 2018-08-02 MED ORDER — HYDROCODONE-ACETAMINOPHEN 5-325 MG PO TABS: 0.5000 | ORAL_TABLET | Freq: Three times a day (TID) | ORAL | 0 refills | Status: DC | PRN

## 2018-08-02 NOTE — Addendum Note (Signed)
Addended by: Delsa Grana on: 08/02/2018 11:58 AM   Modules accepted: Orders

## 2018-08-02 NOTE — Progress Notes (Signed)
CT scan results called to me via pager to on-call provider in the evening last night, pt was "long gone" from the radiology department.  She will be called today with results  Multiple abnormalities found on CT Signs of chronic calcific pancreatitis  Pt does not drink alcohol, no hx of pancreatitis, trying to add on lipid panel to check triglycerides.  Unclear if this CT finding with elevated lipase is the etiology of her pain?  She also has GERD and some gastritis contributing.  Will refer to GI for further eval. Having her hold calcium supplements Treating GERD/gastritis more agressively - as instructed previously but pt was noncompliant (protonix BID + carafate) Instructed to eat small meals, push ample fluids Zofran for nausea and low dose norco 5-325 1/2 -1 tablet TID PRN for severe pain. Close follow up  IMPRESSION: 1. Nonobstructive right nephrolithiasis. 2. Sigmoid diverticulosis without appreciable diverticulitis. 3. Other imaging findings of potential clinical significance: Aortic Atherosclerosis (ICD10-I70.0). Coronary atherosclerosis. Skin thickening along both breasts, uncertain significance, correlate with mammography and/or direct inspection. 2.5 cm myelolipoma of the right adrenal gland. Pancreatic calcifications suggest chronic calcific pancreatitis. Lower lumbar spondylosis and degenerative disc disease with impingement at L3-4, L4-5, and L5-S1. Right renal cyst with several small hypodense exophytic renal lesions which are technically too small to characterize

## 2018-08-03 ENCOUNTER — Other Ambulatory Visit: Payer: Self-pay

## 2018-08-03 ENCOUNTER — Other Ambulatory Visit: Payer: Medicare Other

## 2018-08-03 ENCOUNTER — Telehealth: Payer: Self-pay | Admitting: Family Medicine

## 2018-08-03 NOTE — Telephone Encounter (Signed)
Patient calling to let you know that her blood sugar was 215 today  [phone]

## 2018-08-03 NOTE — Telephone Encounter (Signed)
See note

## 2018-08-04 LAB — FECAL GLOBIN BY IMMUNOCHEMISTRY
FECAL GLOBIN RESULT:: NOT DETECTED
MICRO NUMBER:: 478807
SPECIMEN QUALITY:: ADEQUATE

## 2018-08-06 ENCOUNTER — Telehealth: Payer: Self-pay | Admitting: Family Medicine

## 2018-08-06 NOTE — Telephone Encounter (Signed)
Received critical results via fax from [facility]. Patient Lipase was 340. Per patient's chart this lab was addressed last week.

## 2018-08-07 NOTE — Telephone Encounter (Signed)
Ok, thanks.

## 2018-08-07 NOTE — Telephone Encounter (Signed)
Can you please call pt and ask her to bring in her glucometer with her to her f/up visit Thursday.  Thanks!

## 2018-08-07 NOTE — Telephone Encounter (Signed)
Pt is not coming in clinic. Telephone visit.

## 2018-08-09 ENCOUNTER — Other Ambulatory Visit: Payer: Self-pay

## 2018-08-09 ENCOUNTER — Encounter: Payer: Self-pay | Admitting: Family Medicine

## 2018-08-09 ENCOUNTER — Ambulatory Visit (INDEPENDENT_AMBULATORY_CARE_PROVIDER_SITE_OTHER): Payer: Medicare Other | Admitting: Family Medicine

## 2018-08-09 DIAGNOSIS — E119 Type 2 diabetes mellitus without complications: Secondary | ICD-10-CM | POA: Diagnosis not present

## 2018-08-09 DIAGNOSIS — N281 Cyst of kidney, acquired: Secondary | ICD-10-CM | POA: Insufficient documentation

## 2018-08-09 DIAGNOSIS — I700 Atherosclerosis of aorta: Secondary | ICD-10-CM

## 2018-08-09 DIAGNOSIS — K219 Gastro-esophageal reflux disease without esophagitis: Secondary | ICD-10-CM | POA: Diagnosis not present

## 2018-08-09 DIAGNOSIS — K859 Acute pancreatitis without necrosis or infection, unspecified: Secondary | ICD-10-CM

## 2018-08-09 DIAGNOSIS — K5730 Diverticulosis of large intestine without perforation or abscess without bleeding: Secondary | ICD-10-CM | POA: Diagnosis not present

## 2018-08-09 DIAGNOSIS — D1779 Benign lipomatous neoplasm of other sites: Secondary | ICD-10-CM | POA: Insufficient documentation

## 2018-08-09 DIAGNOSIS — I2510 Atherosclerotic heart disease of native coronary artery without angina pectoris: Secondary | ICD-10-CM | POA: Insufficient documentation

## 2018-08-09 MED ORDER — METFORMIN HCL 500 MG PO TABS: 500.0000 mg | ORAL_TABLET | Freq: Two times a day (BID) | ORAL | 1 refills | Status: DC

## 2018-08-09 NOTE — Assessment & Plan Note (Signed)
A1C increased after dose change from 500 BID to XR 750 daily, from 6.7 to 7.5.  After previous med change pt noted improved stomach upset.  Unclear if her pancreatitis is contributing to worsening DM/A1C, or med changed or both.   Change back to 500 mg BID, monitor fasting blood sugar and capture a few post-prandial and bedtime blood sugar.   F/up in 1 month A1C recheck August 2020

## 2018-08-09 NOTE — Assessment & Plan Note (Signed)
GERD sx better with protonix Needs to continue protonix daily Can decrease BID dosing and carafate was PRN will likely be out soon If she has pancreatic insufficiency or continues to have pain secondary to pancreatitis, will need to strictly continue PPI if we tx with pancreatic enzyme supplementation Continue Lifestyle/diet choices for gerd

## 2018-08-09 NOTE — Progress Notes (Signed)
Patient ID: Jennifer Porter, female    DOB: March 11, 1946, 73 y.o.   MRN: [id number]  PCP: Delsa Grana, PA-C  Virtual Visit via telephone  Phone visit arranged with [name] for 08/09/18 at 10:00 AM EDT  Services provided today were via telemedicine through telephone call. Start of phone call:  4:14 PM  I verified that I was speaking with the correct person using two identifiers. Patient reported their location during encounter was at home   Patient consented to telephone visit  I conducted telephone visit from [place] [facility]  Referring Provider:   Delsa Grana, PA-C   All participants in encounter:  Myself and the patient   I discussed the limitations, risks, security and privacy concerns of performing an evaluation and management service by telephone and the availability of in person appointments. I also discussed with the patient that there may be a patient responsible charge related to this service. The patient expressed understanding and agreed to proceed.  Chief Complaint  Patient presents with   Abdominal Pain    GERD and Pancreatitis f/up    Subjective:   Jennifer Porter is a 73 y.o. female, with CC of abdominal pain from pancreatitis and severe flared up GERD.  She is following up today, CT last week showed the chronic pancreatitis and lipase was elevated.  Her GERD was worse about 3 weeks ago and restarting protonix and taking carafate has improved her GERD sx gradually over the past two weeks. She notes overall her pain is much better, appetite is getting a little better, but she still needs to eat small amounts, and still feels a little weak.  She has had no nausea vomiting diarrhea fever sweats chills.  She is starting to work out a little bit to try and gain back her strength.  She states that over the past month she is lost about 10 pounds.   Last week she was referred to GI urgently but she has not been contacted or seen by them  yet.  She explains that today she was able to eat chicken salad on toast, and eggs with toast, neither of these foods hurt her stomach and she is trying to start to add in variety of what she eats.  She still avoiding anything spicy or acidic.  She never did get the Norco or Zofran.  Patient today states that she has had the same pain come and go for several years but she is always been told she just had GERD and this episode of abdominal pain was much more severe lasting much longer and cause much more nausea.  Of note I did review office visits where I saw her a few times last year and prior to that her old PA/PCP saw her mom routinely all of her symptoms for this visits have been described as indigestion and reflux, responded fairly quickly to Protonix, recently earlier this month her symptoms worsened when she had been not taking her PPI and the symptoms did also start with one episode of vomiting up very strong acid.  The remaining abdominal pain and left upper quadrant pain progressed after the GERD symptoms started.  I reviewed her labs back to 2014 and I did not see any lipase or amylase tested before.     She does state that her mom had a history of hypertriglyceridemia and she does to his that treated with fenofibrate.  Her last labs show triglycerides barely out of normal range  at 167.  Going back about 6 years the highest reading for triglycerides was in the 300s  Patient also notes that her blood sugars have been elevated ranging 140s to almost 200s at various times of the day.  Not too long ago we decreased her metformin from 500 mg twice daily to extended release 750 mg to see if her abdominal discomfort would improve with the extended release and decreased dose at that time her diabetes was well controlled.   However over the past year to 6 months her hemoglobin A1c has increased from 6.5-7.5.  She wants to know if we can go back to metformin 500 mg BID.    Patient Active Problem  List   Diagnosis Date Noted   Hormone replacement therapy 01/12/2017   Hot flashes due to menopause 01/12/2017   Hypomagnesemia 06/27/2016   Diarrhea 03/28/2016   Vitamin D deficiency 10/03/2014   Left knee pain 07/02/2014   Hypertension    GERD (gastroesophageal reflux disease)    Diabetes mellitus without complication ([place])    Allergy    Asthma    H/O: gout    Hyperlipidemia     Prior to Admission medications   Medication Sig Start Date End Date Taking? Authorizing Provider  albuterol (PROVENTIL HFA;VENTOLIN HFA) 108 (90 Base) MCG/ACT inhaler Inhale 2 puffs into the lungs every 6 (six) hours as needed for wheezing or shortness of breath. 05/17/16   Alycia Rossetti, MD  aspirin 81 MG tablet Take 81 mg by mouth daily.    [provider]  Cetirizine HCl (ZYRTEC ALLERGY) 10 MG TBDP Take 10 mg by mouth at bedtime. 07/05/18   Delsa Grana, PA-C  cholecalciferol (VITAMIN D) 1000 units tablet Take 1,000 Units by mouth daily.    [provider]  Choline Fenofibrate (FENOFIBRIC ACID) 135 MG CPDR TAKE ONE CAPSULE BY MOUTH EVERY NIGHT AT BEDTIME 07/16/18   Delsa Grana, PA-C  fluticasone (FLONASE) 50 MCG/ACT nasal spray Place 2 sprays into both nostrils daily as needed. 07/05/18   Delsa Grana, PA-C  gabapentin (NEURONTIN) 300 MG capsule TAKE 1 CAPSULE BY MOUTH THREE TIMES A DAY 07/20/17   Orlena Sheldon, PA-C  HYDROcodone-acetaminophen (NORCO/VICODIN) 5-325 MG tablet Take 0.5-1 tablets by mouth 3 (three) times daily as needed for severe pain. 08/02/18   Delsa Grana, PA-C  lisinopril (PRINIVIL,ZESTRIL) 20 MG tablet TAKE ONE (1) TABLET BY MOUTH EVERY DAY 04/06/18   Delsa Grana, PA-C  metFORMIN (GLUCOPHAGE-XR) 750 MG 24 hr tablet TAKE ONE (1) TABLET BY MOUTH EVERY DAY 07/30/18   Delsa Grana, PA-C  Multiple Vitamin (MULTIVITAMIN) tablet Take 1 tablet by mouth daily.    [provider]  ondansetron (ZOFRAN-ODT) 4 MG disintegrating tablet Take 1 tablet (4 mg total)  by mouth every 8 (eight) hours as needed for nausea or vomiting. 08/02/18   Delsa Grana, PA-C  pantoprazole (PROTONIX) 40 MG tablet Take 1 tablet (40 mg total) by mouth 2 (two) times daily. 07/24/18   Delsa Grana, PA-C  pravastatin (PRAVACHOL) 10 MG tablet TAKE ONE TABLET (10MG  TOTAL) BY MOUTH ATBEDTIME 07/16/18   Delsa Grana, PA-C  PREMARIN 0.625 MG tablet TAKE ONE (1) TABLET BY MOUTH EVERY DAY 07/16/18   Delsa Grana, PA-C  sucralfate (CARAFATE) 1 g tablet Take 1 tablet (1 g total) by mouth 4 (four) times daily -  with meals and at bedtime. 08/01/18   Delsa Grana, PA-C    Allergies  Allergen Reactions   Codeine Nausea And Vomiting   Macrobid [  Nitrofurantoin] Hives   Simvastatin     headache    Review of Systems 10 Systems reviewed and are negative for acute change except as noted in the HPI.     Objective:    There were no vitals filed for this visit.   Physical Exam  PE limited with telephone visit.  Pts voice was clear, speaking in full and complete sentences no audible stridor or wheeze.   Results for orders placed or performed in visit on 08/01/18  Urinalysis, Routine w reflex microscopic  Result Value Ref Range   Color, Urine YELLOW YELLOW   APPearance CLEAR CLEAR   Specific Gravity, Urine 1.020 1.001 - 1.03   pH 7.0 5.0 - 8.0   Glucose, UA NEGATIVE NEGATIVE   Bilirubin Urine NEGATIVE NEGATIVE   Ketones, ur NEGATIVE NEGATIVE   Hgb urine dipstick NEGATIVE NEGATIVE   Protein, ur NEGATIVE NEGATIVE   Nitrite NEGATIVE NEGATIVE   Leukocytes,Ua NEGATIVE NEGATIVE  CBC with Differential/Platelet  Result Value Ref Range   WBC 7.5 3.8 - 10.8 Thousand/uL   RBC 4.21 3.80 - 5.10 Million/uL   Hemoglobin 13.3 11.7 - 15.5 g/dL   HCT 38.4 35.0 - 45.0 %   MCV 91.2 80.0 - 100.0 fL   MCH 31.6 27.0 - 33.0 pg   MCHC 34.6 32.0 - 36.0 g/dL   RDW 12.1 11.0 - 15.0 %   Platelets 345 140 - 400 Thousand/uL   MPV 11.0 7.5 - 12.5 fL   Neutro Abs 4,800 1,500 - 7,800 cells/uL   Lymphs Abs  1,860 850 - 3,900 cells/uL   Absolute Monocytes 533 200 - 950 cells/uL   Eosinophils Absolute 210 15 - 500 cells/uL   Basophils Absolute 98 0 - 200 cells/uL   Neutrophils Relative % 64 %   Total Lymphocyte 24.8 %   Monocytes Relative 7.1 %   Eosinophils Relative 2.8 %   Basophils Relative 1.3 %  COMPLETE METABOLIC PANEL WITH GFR  Result Value Ref Range   Glucose, Bld 163 (H) 65 - 99 mg/dL   BUN 19 7 - 25 mg/dL   Creat 0.79 0.60 - 0.93 mg/dL   GFR, Est Non African American 74 > OR = 60 mL/min/1.54m2   GFR, Est African American 86 > OR = 60 mL/min/1.74m2   BUN/Creatinine Ratio NOT APPLICABLE 6 - 22 (calc)   Sodium 140 135 - 146 mmol/L   Potassium 4.6 3.5 - 5.3 mmol/L   Chloride 103 98 - 110 mmol/L   CO2 25 20 - 32 mmol/L   Calcium 9.7 8.6 - 10.4 mg/dL   Total Protein 6.7 6.1 - 8.1 g/dL   Albumin 4.3 3.6 - 5.1 g/dL   Globulin 2.4 1.9 - 3.7 g/dL (calc)   AG Ratio 1.8 1.0 - 2.5 (calc)   Total Bilirubin 0.4 0.2 - 1.2 mg/dL   Alkaline phosphatase (APISO) 17 (L) 37 - 153 U/L   AST 17 10 - 35 U/L   ALT 13 6 - 29 U/L  Lipase  Result Value Ref Range   Lipase 340 (H) 7 - 60 U/L  Fecal Globin By Immunochemistry  Result Value Ref Range   MICRO NUMBER: [id number]    SPECIMEN QUALITY: Adequate    Source: STOOL    STATUS: FINAL    FECAL GLOBIN RESULT: Not Detected   Hemoglobin A1c  Result Value Ref Range   Hgb A1c MFr Bld 7.5 (H) <5.7 % of total Hgb   Mean Plasma Glucose 169 (calc)   eAG (  mmol/L) 9.3 (calc)  Lipid panel  Result Value Ref Range   Cholesterol 167 <200 mg/dL   HDL 49 (L) > OR = 50 mg/dL   Triglycerides 167 (H) <150 mg/dL   LDL Cholesterol (Calc) 91 mg/dL (calc)   Total CHOL/HDL Ratio 3.4 <5.0 (calc)   Non-HDL Cholesterol (Calc) 118 <130 mg/dL (calc)  TEST AUTHORIZATION  Result Value Ref Range   TEST NAME: LIPID PANEL, STANDARD    TEST CODE: [id number]    CLIENT CONTACT: JANINE M    REPORT ALWAYS MESSAGE SIGNATURE     CT ABD/PELVIS w/o contrast:  CLINICAL DATA:   Mid abdominal pain and flank pain for several weeks.  EXAM: CT ABDOMEN AND PELVIS WITHOUT CONTRAST  IMPRESSION: 1. Nonobstructive right nephrolithiasis. 2. Sigmoid diverticulosis without appreciable diverticulitis. 3. Other imaging findings of potential clinical significance:  Aortic Atherosclerosis (ICD10-I70.0).  Coronary atherosclerosis.  Skin thickening along both breasts, uncertain significance, correlate with mammography and/or direct inspection.  2.5 cm myelolipoma of the right adrenal gland.  Pancreatic calcifications suggest chronic calcific pancreatitis.  Lower lumbar spondylosis and degenerative disc disease with impingement at L3-4, L4-5, and L5-S1.  Right renal cyst with several small hypodense exophytic renal lesions which are technically too small to characterize   Electronically Signed   By: Van Clines M.D.   On: 08/01/2018 15:11      Assessment & Plan:   73 y/o follow up after abdominal pain with severe GERD, new dx of pancreatitis one week ago after several weeks of epigastric abd pain/reflux, pain with eating, N, weightloss and weakness.  Lipase was elevated and CT abd showed chronic calcific pancreatitis - in addition to many other incidental findings/abnormalities - which have not yet been addressed or reviewed with pt at length - will need to do at another in person f/up appt.  Problem List Items Addressed This Visit      Cardiovascular and Mediastinum   3-vessel CAD    Pt to follow up to discuss She is on ASA and statin - only tolerates pravastatin, may need to increase or try higher intensity Cardiology referral? For further assessment?        Aortic atherosclerosis (HCC)     Digestive   Gastroesophageal reflux disease    GERD sx better with protonix Needs to continue protonix daily Can decrease BID dosing and carafate was PRN will likely be out soon If she has pancreatic insufficiency or continues to have pain secondary to pancreatitis,  will need to strictly continue PPI if we tx with pancreatic enzyme supplementation Continue Lifestyle/diet choices for gerd      Relevant Orders   COMPLETE METABOLIC PANEL WITH GFR   Acute pancreatitis without infection or necrosis - Primary    Referred to GI urgently, will f/up on that referral likely delayed with COVID restrictions Continue slow advancement of diet, low fat foods, ample clear fluids Recheck labs (lipase)      Relevant Orders   Lipase   COMPLETE METABOLIC PANEL WITH GFR     Endocrine   Diabetes mellitus without complication (HCC)    J1O increased after dose change from 500 BID to XR 750 daily, from 6.7 to 7.5.  After previous med change pt noted improved stomach upset.  Unclear if her pancreatitis is contributing to worsening DM/A1C, or med changed or both.   Change back to 500 mg BID, monitor fasting blood sugar and capture a few post-prandial and bedtime blood sugar.   F/up in 1 month A1C recheck  August 2020      Relevant Medications   metFORMIN (GLUCOPHAGE) 500 MG tablet   Other Relevant Orders   COMPLETE METABOLIC PANEL WITH GFR   Myelolipoma of right adrenal gland     Genitourinary   Renal cyst, right     Other   Sigmoid diverticulosis     Patient is still relatively new to me and I did spend extensive amount of time today reviewing her chart, reviewing the incidental findings and researching follow-up for them.  I reviewed again patient's EKG from 08/01/2018 which showed normal sinus rhythm with a rate of 90, left axis deviation, left anterior fascicular block and no ST elevation or depression, her symptoms were epigastric and GI was not suspicious at all for ACS, she has not had any exertional symptoms chest pain shortness of breath.  Her CT scan of her abdomen and pelvis did show extensive atherosclerotic disease including coronary arteries/three-vessel disease and aortoiliac atherosclerotic vascular disease.  Patient is on pravastatin 10 mg and baby  aspirin, follow-up today was rechecking her abdominal pain, I will need to schedule her for a routine follow-up to discuss the remaining incidental findings, see if she would be agreeable to more aggressive statin therapy to prevent a major cardiac event stroke.  Other incidental findings were right renal cyst, right renal myelolipoma, right kidney stones, thoracolumbar spondylosis and degenerative changes, sigmoid diverticulosis. Right-sided findings do not correlate with any symptoms.  I am going to consult with the physicians at my practice see if they recommend any certain follow-up.   I feel like asymptomatic kidney stones there is nothing to do there, the renal cyst and renal mass are likely benign according to up-to-date.  Patient is not symptomatic with her back pain as far as I have discussed with her.  And cardiac and atherosclerotic findings she also does not have any symptoms of and has some medical therapy already in place.     I do a follow-up for the pancreatitis, following up on the referral.  Patient will need to come back in the next 2 to 4 weeks to recheck her abdominal pain appetite, weight, blood sugars and repeat her labs.  I discussed the assessment and treatment plan with the patient. The patient was provided an opportunity to ask questions and all were answered. The patient agreed with the plan and demonstrated an understanding of the instructions.   The patient was advised to call back or seek an in-person evaluation if the symptoms worsen or if the condition fails to improve as anticipated.  Phone call concluded at 4:39 PM  I provided 25 minutes of non-face-to-face time during this encounter.  Delsa Grana, PA-C 08/09/18 10:16 AM

## 2018-08-09 NOTE — Assessment & Plan Note (Signed)
Pt to follow up to discuss She is on ASA and statin - only tolerates pravastatin, may need to increase or try higher intensity Cardiology referral? For further assessment?

## 2018-08-09 NOTE — Assessment & Plan Note (Signed)
Referred to GI urgently, will f/up on that referral likely delayed with COVID restrictions Continue slow advancement of diet, low fat foods, ample clear fluids Recheck labs (lipase)

## 2018-08-15 ENCOUNTER — Encounter: Payer: Self-pay | Admitting: Internal Medicine

## 2018-08-22 ENCOUNTER — Ambulatory Visit (INDEPENDENT_AMBULATORY_CARE_PROVIDER_SITE_OTHER): Payer: Medicare Other | Admitting: Family Medicine

## 2018-08-22 ENCOUNTER — Other Ambulatory Visit: Payer: Self-pay

## 2018-08-22 VITALS — BP 128/80 | HR 80 | Temp 98.4°F | Resp 18 | Ht 64.0 in | Wt 156.4 lb

## 2018-08-22 DIAGNOSIS — K861 Other chronic pancreatitis: Secondary | ICD-10-CM

## 2018-08-22 DIAGNOSIS — E119 Type 2 diabetes mellitus without complications: Secondary | ICD-10-CM | POA: Diagnosis not present

## 2018-08-22 DIAGNOSIS — K8689 Other specified diseases of pancreas: Secondary | ICD-10-CM | POA: Diagnosis not present

## 2018-08-22 DIAGNOSIS — K219 Gastro-esophageal reflux disease without esophagitis: Secondary | ICD-10-CM

## 2018-08-22 DIAGNOSIS — K859 Acute pancreatitis without necrosis or infection, unspecified: Secondary | ICD-10-CM | POA: Diagnosis not present

## 2018-08-22 DIAGNOSIS — I700 Atherosclerosis of aorta: Secondary | ICD-10-CM | POA: Diagnosis not present

## 2018-08-22 NOTE — Progress Notes (Signed)
Subjective:    Patient ID: Jennifer Porter, female    DOB: [birth year], 73 y.o.   MRN: [id number]  Patient presents for Pancreatitis Here to follow-up pancreatitis.  She had newly diagnosed pancreatitis about 3 weeks ago.  She presented with abdominal discomfort nausea.  Please see note from 515 and 518.  CT scan showed multiple abnormalities some but  Benign  which I reviewed in detail with her today.   IMPRESSION: 1. Nonobstructive right nephrolithiasis. 2. Sigmoid diverticulosis without appreciable diverticulitis. 3. Other imaging findings of potential clinical significance: Aortic Atherosclerosis (ICD10-I70.0). Coronary atherosclerosis. Skin thickening along both breasts, uncertain significance, correlate with mammography and/or direct inspection. 2.5 cm myelolipoma of the right adrenal gland. Pancreatic calcifications suggest chronic calcific pancreatitis. Lower lumbar spondylosis and degenerative disc disease with impingement at L3-4, L4-5, and L5-S1. Right renal cyst with several small hypodense exophytic renal lesions which are technically too small to characterize  Diabetes mellitus her blood sugars have been up and down her last A1c 7.5%.  She does feel like she is tolerating the metformin 500 mg twice a day better than the extended release.  Has any hypoglycemia symptoms.  Her appetite is now starting to pick up as well.  Review Of Systems:  GEN- denies fatigue, fever, weight loss,weakness, recent illness HEENT- denies eye drainage, change in vision, nasal discharge, CVS- denies chest pain, palpitations RESP- denies SOB, cough, wheeze ABD- +N/V, change in stools,+bd pain GU- denies dysuria, hematuria, dribbling, incontinence MSK- denies joint pain, muscle aches, injury Neuro- denies headache, dizziness, syncope, seizure activity       Objective:    BP 128/80   Pulse 80   Temp 98.4 F (36.9 C)   Resp 18   Ht 5\' 4"  (1.626 m)   Wt 156 lb 6.4 oz (70.9 kg)   SpO2  95%   BMI 26.85 kg/m  GEN- NAD, alert and oriented x3 HEENT- PERRL, EOMI, non injected sclera, pink conjunctiva, MMM, oropharynx clear Neck- Supple, no thyromegaly CVS- RRR, no murmur RESP-CTAB ABD-NABS,soft,mild TTP upper quadrants, no rebound, no guarding, no mass palpated  EXT- No edema Pulses- Radial 2+        Assessment & Plan:    Recheck lipase and metabolic panel To the entire CT scan all her labs with her.  She was also given copies of this.   Problem List Items Addressed This Visit      Unprioritized   Aortic atherosclerosis ([place])    She is on pravastatin although this is a weaker statin drug in the setting of her GI upset pancreatitis she is tolerating this for now what other things settle out I would actually move her up to a high dose statin such as Lipitor or Crestor      Diabetes mellitus without complication ([place])    She is tolerating the metformin to 500 mg twice a day.  Hopefully once we get her pancreas settle patient see some improvement with the fluctuations in her blood sugars as well.      Gastroesophageal reflux disease    We will continue the pantoprazole.  She is not requiring the Carafate at this time.  It is likely that some of her episodes in the past for pancreatic flares when she was having epigastric discomfort.  She does state however during those times it would only last for few days and then she would be fine      Pancreatic calcification    CT scan significant elevated lipase concerning for  recurrent pancreatitis episodes.  Will call GI got her appointment moved up to this Friday.  She is now slowly progressing her diet without any vomiting.  I think that she will likely need pancreatic enzymes but will defer to gastroenterology.       Other Visit Diagnoses    Chronic pancreatitis, unspecified pancreatitis type ([place])    -  Primary      Note: This dictation was prepared with Dragon dictation along with smaller phrase technology. Any  transcriptional errors that result from this process are unintentional.

## 2018-08-22 NOTE — Patient Instructions (Addendum)
F/U as previous with Leisa  Stop the lisinopril 20mg  if blood pressure gets that low again

## 2018-08-23 ENCOUNTER — Encounter: Payer: Self-pay | Admitting: Family Medicine

## 2018-08-23 LAB — COMPLETE METABOLIC PANEL WITH GFR
AG Ratio: 2 (calc) (ref 1.0–2.5)
ALT: 13 U/L (ref 6–29)
AST: 15 U/L (ref 10–35)
Albumin: 4.3 g/dL (ref 3.6–5.1)
Alkaline phosphatase (APISO): 15 U/L — ABNORMAL LOW (ref 37–153)
BUN: 18 mg/dL (ref 7–25)
CO2: 25 mmol/L (ref 20–32)
Calcium: 9.5 mg/dL (ref 8.6–10.4)
Chloride: 103 mmol/L (ref 98–110)
Creat: 0.77 mg/dL (ref 0.60–0.93)
GFR, Est African American: 89 mL/min/{1.73_m2} (ref >=60)
GFR, Est Non African American: 77 mL/min/{1.73_m2} (ref >=60)
Globulin: 2.2 g/dL (calc) (ref 1.9–3.7)
Glucose, Bld: 225 mg/dL — ABNORMAL HIGH (ref 65–99)
Potassium: 4.4 mmol/L (ref 3.5–5.3)
Sodium: 140 mmol/L (ref 135–146)
Total Bilirubin: 0.4 mg/dL (ref 0.2–1.2)
Total Protein: 6.5 g/dL (ref 6.1–8.1)

## 2018-08-23 LAB — LIPASE: Lipase: 150 U/L — ABNORMAL HIGH (ref 7–60)

## 2018-08-23 NOTE — Assessment & Plan Note (Signed)
She is on pravastatin although this is a weaker statin drug in the setting of her GI upset pancreatitis she is tolerating this for now what other things settle out I would actually move her up to a high dose statin such as Lipitor or Crestor

## 2018-08-23 NOTE — Assessment & Plan Note (Signed)
She is tolerating the metformin to 500 mg twice a day.  Hopefully once we get her pancreas settle patient see some improvement with the fluctuations in her blood sugars as well.

## 2018-08-23 NOTE — Assessment & Plan Note (Signed)
CT scan significant elevated lipase concerning for recurrent pancreatitis episodes.  Will call GI got her appointment moved up to this Friday.  She is now slowly progressing her diet without any vomiting.  I think that she will likely need pancreatic enzymes but will defer to gastroenterology.

## 2018-08-23 NOTE — Assessment & Plan Note (Signed)
We will continue the pantoprazole.  She is not requiring the Carafate at this time.  It is likely that some of her episodes in the past for pancreatic flares when she was having epigastric discomfort.  She does state however during those times it would only last for few days and then she would be fine

## 2018-08-24 ENCOUNTER — Encounter: Payer: Self-pay | Admitting: Gastroenterology

## 2018-08-24 ENCOUNTER — Other Ambulatory Visit: Payer: Self-pay

## 2018-08-24 ENCOUNTER — Encounter (HOSPITAL_COMMUNITY): Payer: Self-pay

## 2018-08-24 ENCOUNTER — Ambulatory Visit (INDEPENDENT_AMBULATORY_CARE_PROVIDER_SITE_OTHER): Payer: Medicare Other | Admitting: Gastroenterology

## 2018-08-24 ENCOUNTER — Emergency Department (HOSPITAL_COMMUNITY): Payer: Medicare Other

## 2018-08-24 ENCOUNTER — Emergency Department (HOSPITAL_COMMUNITY)
Admission: EM | Admit: 2018-08-24 | Discharge: 2018-08-24 | Disposition: A | Discharge status: Home / Self Care | Payer: Medicare Other | Attending: Emergency Medicine | Admitting: Emergency Medicine

## 2018-08-24 VITALS — BP 166/84 | HR 97 | Temp 97.2°F | Ht 64.0 in | Wt 154.2 lb

## 2018-08-24 DIAGNOSIS — Z7982 Long term (current) use of aspirin: Secondary | ICD-10-CM | POA: Insufficient documentation

## 2018-08-24 DIAGNOSIS — J45909 Unspecified asthma, uncomplicated: Secondary | ICD-10-CM | POA: Diagnosis not present

## 2018-08-24 DIAGNOSIS — Z7984 Long term (current) use of oral hypoglycemic drugs: Secondary | ICD-10-CM | POA: Insufficient documentation

## 2018-08-24 DIAGNOSIS — N200 Calculus of kidney: Secondary | ICD-10-CM | POA: Diagnosis not present

## 2018-08-24 DIAGNOSIS — R195 Other fecal abnormalities: Secondary | ICD-10-CM

## 2018-08-24 DIAGNOSIS — K5790 Diverticulosis of intestine, part unspecified, without perforation or abscess without bleeding: Secondary | ICD-10-CM | POA: Diagnosis not present

## 2018-08-24 DIAGNOSIS — I10 Essential (primary) hypertension: Secondary | ICD-10-CM | POA: Diagnosis not present

## 2018-08-24 DIAGNOSIS — R1084 Generalized abdominal pain: Secondary | ICD-10-CM | POA: Diagnosis not present

## 2018-08-24 DIAGNOSIS — K861 Other chronic pancreatitis: Secondary | ICD-10-CM

## 2018-08-24 DIAGNOSIS — K859 Acute pancreatitis without necrosis or infection, unspecified: Secondary | ICD-10-CM | POA: Diagnosis not present

## 2018-08-24 DIAGNOSIS — K219 Gastro-esophageal reflux disease without esophagitis: Secondary | ICD-10-CM | POA: Diagnosis not present

## 2018-08-24 DIAGNOSIS — Z79899 Other long term (current) drug therapy: Secondary | ICD-10-CM | POA: Insufficient documentation

## 2018-08-24 DIAGNOSIS — E119 Type 2 diabetes mellitus without complications: Secondary | ICD-10-CM | POA: Diagnosis not present

## 2018-08-24 DIAGNOSIS — R1013 Epigastric pain: Secondary | ICD-10-CM | POA: Diagnosis present

## 2018-08-24 DIAGNOSIS — R110 Nausea: Secondary | ICD-10-CM | POA: Diagnosis not present

## 2018-08-24 LAB — URINALYSIS, ROUTINE W REFLEX MICROSCOPIC
Bacteria, UA: NONE SEEN
Bilirubin Urine: NEGATIVE
Glucose, UA: 150 mg/dL — AB
Ketones, ur: NEGATIVE mg/dL
Leukocytes,Ua: NEGATIVE
Nitrite: NEGATIVE
Protein, ur: NEGATIVE mg/dL
Specific Gravity, Urine: 1.016 (ref 1.005–1.030)
pH: 5 (ref 5.0–8.0)

## 2018-08-24 LAB — COMPREHENSIVE METABOLIC PANEL
ALT: 17 U/L (ref 0–44)
AST: 20 U/L (ref 15–41)
Albumin: 4.4 g/dL (ref 3.5–5.0)
Alkaline Phosphatase: 17 U/L — ABNORMAL LOW (ref 38–126)
Anion gap: 15 (ref 5–15)
BUN: 16 mg/dL (ref 8–23)
CO2: 23 mmol/L (ref 22–32)
Calcium: 9.8 mg/dL (ref 8.9–10.3)
Chloride: 102 mmol/L (ref 98–111)
Creatinine, Ser: 0.86 mg/dL (ref 0.44–1.00)
GFR calc Af Amer: >60 mL/min (ref >60)
GFR calc non Af Amer: >60 mL/min (ref >60)
Glucose, Bld: 193 mg/dL — ABNORMAL HIGH (ref 70–99)
Potassium: 3.8 mmol/L (ref 3.5–5.1)
Sodium: 140 mmol/L (ref 135–145)
Total Bilirubin: 0.7 mg/dL (ref 0.3–1.2)
Total Protein: 7.2 g/dL (ref 6.5–8.1)

## 2018-08-24 LAB — LIPASE, BLOOD: Lipase: 72 U/L — ABNORMAL HIGH (ref 11–51)

## 2018-08-24 LAB — CBC
HCT: 37.3 % (ref 36.0–46.0)
Hemoglobin: 12.4 g/dL (ref 12.0–15.0)
MCH: 30.3 pg (ref 26.0–34.0)
MCHC: 33.2 g/dL (ref 30.0–36.0)
MCV: 91.2 fL (ref 80.0–100.0)
Platelets: 242 10*3/uL (ref 150–400)
RBC: 4.09 MIL/uL (ref 3.87–5.11)
RDW: 12.5 % (ref 11.5–15.5)
WBC: 7.3 10*3/uL (ref 4.0–10.5)
nRBC: 0 % (ref 0.0–0.2)

## 2018-08-24 MED ORDER — IOHEXOL 300 MG/ML  SOLN
100.0000 mL | Freq: Once | INTRAMUSCULAR | Status: AC | PRN
  Administered 2018-08-24: 100 mL via INTRAVENOUS

## 2018-08-24 MED ORDER — SODIUM CHLORIDE 0.9 % IV BOLUS
1000.0000 mL | Freq: Once | INTRAVENOUS | Status: AC
  Administered 2018-08-24: 1000 mL via INTRAVENOUS

## 2018-08-24 MED ORDER — SODIUM CHLORIDE 0.9% FLUSH
3.0000 mL | Freq: Once | INTRAVENOUS | Status: AC
  Administered 2018-08-24: 3 mL via INTRAVENOUS

## 2018-08-24 NOTE — Assessment & Plan Note (Addendum)
Loose stools several times a day. Present now and prior to this acute onset of epigastric pain. No blood or melena. I suspect this is due to her chronic pancreatitis. We will evaluate this further as an outpatient once this acute episode is resolved. Patient is going to the ED at this time. She likely needs to be on creon.

## 2018-08-24 NOTE — Progress Notes (Signed)
Primary Care Physician:  Delsa Grana, PA-C Primary Gastroenterologist:  Dr. Gala Romney  Chief Complaint  Patient presents with  . Abdominal Pain    states she has pancreatitis  . Gastroesophageal Reflux    HPI:   Jennifer Porter is a 73 y.o. female presenting today at the request of Delsa Grana, PA-C. Past medical history significant for diabetes, hyperlipidemia, hypertension, and GERD.   She was seen has been seen by her PCP in the past for GERD with intermittent episodes of increasing epigastric pain that was attributed to GERD flares. Seen by PCP on 5/5 by PCP with worsening abdominal pain and nausea for 3 days. Subsequently on 08/01/18, lipase was elevated at 340 and a CT suggesting chronic pancreatitis with punctate calcifications along the pancreatic parenchyma. Gallbladder absent and hepatobiliary on CT unremarkable. Lipid panel on 5/13- triglycerides 167, HDL 49, and LDL 167. Labs on 6/3 remarkable for Lipase 150, glucose 225, and Alk phos 15. Hgb, Hematocrit, BUN, and Cr all within normal limits.   Today she states she is "not doing too good." Pain today 8/10. Initially pain was across upper abdomen, now more diffuse in nature. Somewhat worsened with movement. Not taking anything for pain at this time. Not able to eat well. Small amounts of food like a cup of Jello, a boiled egg, or a piece of toast. Feels like she is not getting the nutrition she needs. Feel some pain, fullness, and uncomfortable with eating. Only drinking 1-2 bottles of water and a glass of milk daily. Feels very week. Almost passed out a few days ago, checked blood pressure and it was 90/40s at that time. Blood sugars have been elevated. BMs are soft-runny. Typically BM prior to this were loose several times a day. No blood or melena. Occasional nausea, vomiting early May, not for a couple weeks now.   Weight is down about 5 lbs. Lightheadedness with position change. No fevers or chills.      Multiple family members  with history of cancer including uterine, colon, and pancreatic.   Denies ever drinking alcohol, cholecystectomy in the past, hyperlipidemia on pravastatin.   Tries not to take NSAIDs.   Past Medical History:  Diagnosis Date  . Allergy    seasonal  . Asthma   . Diabetes mellitus without complication ([place])   . GERD (gastroesophageal reflux disease)    hiatal hernia  . H/O: gout   . Hyperlipidemia   . Hypertension     Past Surgical History:  Procedure Laterality Date  . ABDOMINAL HYSTERECTOMY    . APPENDECTOMY    . Arthroscopic Left Knee Left 03/22/2003  . CHOLECYSTECTOMY      Current Outpatient Medications  Medication Sig Dispense Refill  . albuterol (PROVENTIL HFA;VENTOLIN HFA) 108 (90 Base) MCG/ACT inhaler Inhale 2 puffs into the lungs every 6 (six) hours as needed for wheezing or shortness of breath. 1 Inhaler 0  . aspirin 81 MG tablet Take 81 mg by mouth daily.    . cholecalciferol (VITAMIN D) 1000 units tablet Take 1,000 Units by mouth daily.    . Choline Fenofibrate (FENOFIBRIC ACID) 135 MG CPDR TAKE ONE CAPSULE BY MOUTH EVERY NIGHT AT BEDTIME 90 capsule 0  . fluticasone (FLONASE) 50 MCG/ACT nasal spray Place 2 sprays into both nostrils daily as needed. 16 g 5  . gabapentin (NEURONTIN) 300 MG capsule TAKE 1 CAPSULE BY MOUTH THREE TIMES A DAY (Patient taking differently: Take 300 mg by mouth daily. ) 90 capsule 1  .  lisinopril (PRINIVIL,ZESTRIL) 20 MG tablet TAKE ONE (1) TABLET BY MOUTH EVERY DAY 90 tablet 1  . magnesium oxide (MAG-OX) 400 MG tablet Take 400 mg by mouth daily.    . metFORMIN (GLUCOPHAGE) 500 MG tablet Take 1 tablet (500 mg total) by mouth 2 (two) times daily with a meal. 60 tablet 1  . pantoprazole (PROTONIX) 40 MG tablet Take 1 tablet (40 mg total) by mouth 2 (two) times daily. 60 tablet 2  . pravastatin (PRAVACHOL) 10 MG tablet TAKE ONE TABLET (10MG TOTAL) BY MOUTH ATBEDTIME 90 tablet 0  . PREMARIN 0.625 MG tablet TAKE ONE (1) TABLET BY MOUTH EVERY DAY 90  tablet 0   No current facility-administered medications for this visit.     Allergies as of 08/24/2018 - Review Complete 08/24/2018  Allergen Reaction Noted  . Codeine Nausea And Vomiting 11/28/2012  . Macrobid [nitrofurantoin] Hives 11/28/2012  . Simvastatin  10/26/2017    Family History  Problem Relation Age of Onset  . Stroke Mother   . Diabetes Mother   . Vision loss Mother        eye removed  . Cancer Mother        cancer in eye - removed  . Diabetes Brother   . Heart disease Father   . Colon cancer Sister   . Uterine cancer Paternal Grandmother   . Uterine cancer Other   . Pancreatic cancer Maternal Aunt     Social History   Socioeconomic History  . Marital status: Single    Spouse name: Not on file  . Number of children: Not on file  . Years of education: Not on file  . Highest education level: Not on file  Occupational History  . Not on file  Social Needs  . Financial resource strain: Not on file  . Food insecurity:    Worry: Not on file    Inability: Not on file  . Transportation needs:    Medical: Not on file    Non-medical: Not on file  Tobacco Use  . Smoking status: Never Smoker  . Smokeless tobacco: Never Used  Substance and Sexual Activity  . Alcohol use: No  . Drug use: No  . Sexual activity: Not on file  Lifestyle  . Physical activity:    Days per week: Not on file    Minutes per session: Not on file  . Stress: Not on file  Relationships  . Social connections:    Talks on phone: Not on file    Gets together: Not on file    Attends religious service: Not on file    Active member of club or organization: Not on file    Attends meetings of clubs or organizations: Not on file    Relationship status: Not on file  . Intimate partner violence:    Fear of current or ex partner: Not on file    Emotionally abused: Not on file    Physically abused: Not on file    Forced sexual activity: Not on file  Other Topics Concern  . Not on file   Social History Narrative   Retired March 2014.   Did teach Kindergarten at [employer]    Review of Systems: Gen: See HPI  CV: Denies chest pain, heart palpitations  Resp: Denies shortness of breath and cough  GI: See HPI GU : Denies pain or burning with urination, urinary frequency,  MS: Admits to chronic low back pain  Endo: notes 5lb weight loss Heme:  See HPI. All others negative except as per HPI.  Physical Exam: BP (!) 166/84   Pulse 97   Temp (!) 97.2 F (36.2 C) (Oral)   Ht _0  (1.626 m)   Wt 154 lb 3.2 oz (69.9 kg)   BMI 26.47 kg/m  General:   Alert and oriented. Appears uncomfortable and in pain.   Head:  Normocephalic and atraumatic. Eyes:  Without icterus, sclera clear and conjunctiva pink.  Ears:  Normal auditory acuity. Mouth:  Mucosa pink and appears somewhat dry, no deformity or lesions  Lungs:  Clear to auscultation bilaterally. No wheezes, rales, or rhonchi. No distress.  Heart:  Positive for tachycardia.S1, S2 present without murmurs appreciated.   Abdomen:  +BS. Very tender with light palpation especially in the upper abdomen but also tender diffusely. Exam somewhat limited due to pain. Rectal:  Deferred  Msk:  Symmetrical without gross deformities. Normal posture. Extremities:  Without edema. Neurologic:  Alert and  oriented x4;  grossly normal neurologically. Skin:  Intact without significant lesions or rashes. Psych:  Alert and cooperative. Normal mood and affect.

## 2018-08-24 NOTE — ED Provider Notes (Signed)
[facility] EMERGENCY DEPARTMENT Provider Note   CSN: [id number] Arrival date & time: 08/24/18  1120    History   Chief Complaint Chief Complaint  Patient presents with  . Abdominal Pain    HPI Jennifer Porter is a 73 y.o. female past medical history of GERD, hypertension, asthma, type 2 diabetes, presenting to the emergency department from [facility] gastroenterology for pancreatitis.  Patient has been dealing with an episode of pancreatitis since beginning of May, and was sent to the ED for further evaluation by GI.  She states initially her pain was epigastric and sharp, however has been somewhat improving and is more dull and achy.  She has had intermittent nausea no vomiting.  She has also had loose stools.  She states she has not been treating her pain at home due to personal preference.  She has been managed by her PCP, however recently had a referral to GI, today was her first visit.  Per chart review, GI sent patient to the ED for anorexia, increasing weakness, and to the fact that pancreatitis was not resolved yet.  Per provider documentation, it was recommended she obtain CT scan of the abdomen today.  It appears in the beginning of May her lipase was 340, however has been downtrending with last check of 150.  No fevers or chills or other associated symptoms.  Past abdominal surgeries include cholecystectomy, appendectomy, hysterectomy.     The history is provided by the patient and medical records.    Past Medical History:  Diagnosis Date  . Allergy    seasonal  . Asthma   . Diabetes mellitus without complication ([place])   . GERD (gastroesophageal reflux disease)    hiatal hernia  . H/O: gout   . Hyperlipidemia   . Hypertension     Patient Active Problem List   Diagnosis Date Noted  . Acute on chronic pancreatitis ([place]) 08/24/2018  . Loose stools 08/24/2018  . Pancreatic calcification 08/22/2018  . Acute pancreatitis without infection or necrosis 08/09/2018  .  Sigmoid diverticulosis 08/09/2018  . 3-vessel CAD 08/09/2018  . Aortic atherosclerosis ([place]) 08/09/2018  . Myelolipoma of right adrenal gland 08/09/2018  . Renal cyst, right 08/09/2018  . Hormone replacement therapy 01/12/2017  . Hot flashes due to menopause 01/12/2017  . Hypomagnesemia 06/27/2016  . Diarrhea 03/28/2016  . Vitamin D deficiency 10/03/2014  . Left knee pain 07/02/2014  . Hypertension   . Gastroesophageal reflux disease   . Diabetes mellitus without complication ([place])   . Allergy   . Asthma   . H/O: gout   . Hyperlipidemia     Past Surgical History:  Procedure Laterality Date  . ABDOMINAL HYSTERECTOMY    . APPENDECTOMY    . Arthroscopic Left Knee Left 03/22/2003  . CHOLECYSTECTOMY       OB History   No obstetric history on file.      Home Medications    Prior to Admission medications   Medication Sig Start Date End Date Taking? Authorizing Provider  albuterol (PROVENTIL HFA;VENTOLIN HFA) 108 (90 Base) MCG/ACT inhaler Inhale 2 puffs into the lungs every 6 (six) hours as needed for wheezing or shortness of breath. 05/17/16   Alycia Rossetti, MD  aspirin 81 MG tablet Take 81 mg by mouth daily.    [provider]  cholecalciferol (VITAMIN D) 1000 units tablet Take 1,000 Units by mouth daily.    [provider]  Choline Fenofibrate (FENOFIBRIC ACID) 135 MG CPDR TAKE ONE CAPSULE BY  MOUTH EVERY NIGHT AT BEDTIME 07/16/18   Delsa Grana, PA-C  fluticasone (FLONASE) 50 MCG/ACT nasal spray Place 2 sprays into both nostrils daily as needed. 07/05/18   Delsa Grana, PA-C  gabapentin (NEURONTIN) 300 MG capsule TAKE 1 CAPSULE BY MOUTH THREE TIMES A DAY Patient taking differently: Take 300 mg by mouth daily.  07/20/17   Dena Billet B, PA-C  lisinopril (PRINIVIL,ZESTRIL) 20 MG tablet TAKE ONE (1) TABLET BY MOUTH EVERY DAY 04/06/18   Delsa Grana, PA-C  magnesium oxide (MAG-OX) 400 MG tablet Take 400 mg by mouth daily.    [provider]  metFORMIN  (GLUCOPHAGE) 500 MG tablet Take 1 tablet (500 mg total) by mouth 2 (two) times daily with a meal. 08/09/18   Delsa Grana, PA-C  pantoprazole (PROTONIX) 40 MG tablet Take 1 tablet (40 mg total) by mouth 2 (two) times daily. 07/24/18   Delsa Grana, PA-C  pravastatin (PRAVACHOL) 10 MG tablet TAKE ONE TABLET (10MG  TOTAL) BY MOUTH ATBEDTIME 07/16/18   Delsa Grana, PA-C  PREMARIN 0.625 MG tablet TAKE ONE (1) TABLET BY MOUTH EVERY DAY 07/16/18   Delsa Grana, PA-C    Family History Family History  Problem Relation Age of Onset  . Stroke Mother   . Diabetes Mother   . Vision loss Mother        eye removed  . Cancer Mother        cancer in eye - removed  . Diabetes Brother   . Heart disease Father   . Colon cancer Sister   . Uterine cancer Paternal Grandmother   . Uterine cancer Maternal Aunt   . Pancreatic cancer Maternal Aunt     Social History Social History   Tobacco Use  . Smoking status: Never Smoker  . Smokeless tobacco: Never Used  Substance Use Topics  . Alcohol use: No  . Drug use: No     Allergies   Codeine; Macrobid [nitrofurantoin]; and Simvastatin   Review of Systems Review of Systems  Constitutional: Positive for appetite change. Negative for fever.  Gastrointestinal: Positive for abdominal pain, diarrhea and nausea. Negative for vomiting.  All other systems reviewed and are negative.    Physical Exam Updated Vital Signs BP 136/63   Pulse 95   Temp 98.3 F (36.8 C) (Oral)   Resp 18   Ht 5\' 4"  (1.626 m)   Wt 69.9 kg   SpO2 98%   BMI 26.43 kg/m   Physical Exam Vitals signs and nursing note reviewed.  Constitutional:      General: She is not in acute distress.    Appearance: She is well-developed. She is not ill-appearing.  HENT:     Head: Normocephalic and atraumatic.     Mouth/Throat:     Mouth: Mucous membranes are moist.  Eyes:     Conjunctiva/sclera: Conjunctivae normal.  Cardiovascular:     Rate and Rhythm: Normal rate and regular rhythm.   Pulmonary:     Effort: Pulmonary effort is normal. No respiratory distress.     Breath sounds: Normal breath sounds.  Chest:     Chest wall: No tenderness.  Abdominal:     General: Bowel sounds are normal. There is no distension.     Palpations: Abdomen is soft.     Tenderness: There is abdominal tenderness. There is no guarding or rebound.     Comments: Generalized tenderness, however worse in the epigastrium.  Skin:    General: Skin is warm.  Neurological:  Mental Status: She is alert.  Psychiatric:        Behavior: Behavior normal.      ED Treatments / Results  Labs (all labs ordered are listed, but only abnormal results are displayed) Labs Reviewed  LIPASE, BLOOD - Abnormal; Notable for the following components:      Result Value   Lipase 72 (*)    All other components within normal limits  COMPREHENSIVE METABOLIC PANEL - Abnormal; Notable for the following components:   Glucose, Bld 193 (*)    Alkaline Phosphatase 17 (*)    All other components within normal limits  URINALYSIS, ROUTINE W REFLEX MICROSCOPIC - Abnormal; Notable for the following components:   APPearance HAZY (*)    Glucose, UA 150 (*)    Hgb urine dipstick SMALL (*)    All other components within normal limits  CBC    EKG None  Radiology Ct Abdomen Pelvis W Contrast  Result Date: 08/24/2018 CLINICAL DATA:  History of pancreatitis.  Nausea and abdominal pain. EXAM: CT ABDOMEN AND PELVIS WITH CONTRAST TECHNIQUE: Multidetector CT imaging of the abdomen and pelvis was performed using the standard protocol following bolus administration of intravenous contrast. CONTRAST:  160mL OMNIPAQUE IOHEXOL 300 MG/ML  SOLN COMPARISON:  08/01/2018 FINDINGS: Lower chest: No acute abnormality. Hepatobiliary: No focal liver abnormality is seen. No gallstones, gallbladder wall thickening, or biliary dilatation. Pancreas: Mild peripancreatic inflammatory changes around the pancreatic head. Pancreas enhances normally and  homogeneously. No pancreatic mass or necrosis. Spleen: Normal in size without focal abnormality. Adrenals/Urinary Tract: Normal adrenal glands. Stable bilateral renal cysts. Largest right renal cyst measures 6.2 cm. Nonobstructing right renal calculi. No obstructive uropathy. Normal bladder. Stomach/Bowel: Stomach is within normal limits. No evidence of bowel wall thickening, distention, or inflammatory changes. Diverticulosis without evidence of diverticulitis. Vascular/Lymphatic: Normal caliber abdominal aorta with mild atherosclerosis. No lymphadenopathy. Reproductive: Status post hysterectomy. No adnexal masses. Other: No abdominal wall hernia or abnormality. No abdominopelvic ascites. Musculoskeletal: No acute osseous abnormality. No aggressive osseous lesion. Degenerative disease with disc height loss at L3-4, L4-5 and L5-S1 with bilateral facet arthropathy. IMPRESSION: 1. Mild pancreatitis. No pancreatic pseudocyst or pancreatic necrosis. 2. Nonobstructing right renal calculi. 3.  Aortic Atherosclerosis (ICD10-I70.0). 4. Diverticulosis without evidence of diverticulitis. Electronically Signed   By: Kathreen Devoid   On: 08/24/2018 14:13    Procedures Procedures (including critical care time)  Medications Ordered in ED Medications  sodium chloride flush (NS) 0.9 % injection 3 mL (3 mLs Intravenous Given 08/24/18 1200)  sodium chloride 0.9 % bolus 1,000 mL (0 mLs Intravenous Stopped 08/24/18 1627)  iohexol (OMNIPAQUE) 300 MG/ML solution 100 mL (100 mLs Intravenous Contrast Given 08/24/18 1355)     Initial Impression / Assessment and Plan / ED Course  I have reviewed the triage vital signs and the nursing notes.  Pertinent labs & imaging results that were available during my care of the patient were reviewed by me and considered in my medical decision making (see chart for details).  Clinical Course as of Aug 23 1628  Fri Aug 24, 2018  1600 Patient discussed with Dr. Gala Romney.  At this time, plan to  discharge clear liquid diet for 48 hours for pancreas rest, and follow-up beginning of the week with GI.  Return if worsening.  Appreciate consult.   [JR]    Clinical Course User Index [JR] Nyaisha Simao, [place] N, PA-C       Patient with history of acute on chronic pancreatitis, sent to the ED  by GI for repeat CT scan and further evaluation, given current episode of pancreatitis is still not resolved.  Overall, patient reports pain is been improving.  She has associated nausea without vomiting, and diarrhea.  Patient is nontoxic and nonseptic appearing.  Abdominal exam reveals a soft abdomen with normal bowel sounds.  There is generalized tenderness, however worse in the epigastrium.  No peritoneal signs appreciated.  Vital signs are normal, afebrile.  Labs reveal a lipase of 72, much improved from recent.  No leukocytosis.  LFTs are normal.  Urine is negative.  CT reveals mild pancreatitis, no necrosis or other acute findings.  Patient hydrated in the ED with 1 L of IV fluids.  She continues to decline any pain medication, stating her mother had issues with pain medication and she would prefer to avoid them.  This time, patient is well-appearing and may be safe for discharge, however will consult GI.  Patient discussed with Dr. Gala Romney.  He is agreeable to plan at this time with discharge with clear liquid diet.  He agrees labs and CT scan are reassuring.  Patient is agreeable to this plan as well.  Will discharge with clear liquid diet for 48 hours, and plan to follow-up outpatient with GI in the beginning of the week.  Strict return precautions discussed.  Patient agreeable to plan and safe for discharge.  Discussed results, findings, treatment and follow up. Patient advised of return precautions. Patient verbalized understanding and agreed with plan.   Final Clinical Impressions(s) / ED Diagnoses   Final diagnoses:  Acute on chronic pancreatitis [facility])    ED Discharge Orders    None        Sherrill Buikema, [place] N, PA-C 08/24/18 1634    Milton Ferguson, MD 08/25/18 [phone]

## 2018-08-24 NOTE — Assessment & Plan Note (Signed)
History of GERD with flares in the past causing epigastric pain. Initially, the onset of symptoms leading to the diagnosis of acute on chronic pancreatitis was also thought to be a GERD exacerbation. Currently, she is having minimal reflux symptoms. Continue Protonix 40 mg BID.

## 2018-08-24 NOTE — Discharge Instructions (Addendum)
We recommend a clear liquid diet for the next 48 hours. Avoid any solid foods. Call your GI clinic in the beginning of the week to schedule a follow up appointment. Return to the emergency department if you develop severely worsening abdominal pain, uncontrollable vomiting, fever, or new or concerning symptoms.

## 2018-08-24 NOTE — Assessment & Plan Note (Addendum)
73 y.o. female with acute on chronic pancreatitis diagnosed on 08/01/18. Lipase at that time was 340 and CT without contrast with evidence of chronic pancreatitis. Lipase 150 on 6/3. Patient has been managed as an outpatient since symptom onset on 5/5; however, clinically, patient isn't improving adequately. Now with persistent abdominal pain 8/10, inadequate oral intake, dizziness with position change, weakness, and tachycardia in office today. Etiology of pancreatitis unclear as patient doesn't have a gallbaldder, doesn't drink alcohol, and cholesterol fairly well managed on pravastatin.   Considering patients age, persistent symptoms with minimal improvement, tachycardia, inability to take in adequate nutrition, and feeling "very week", I have advised patient to go to [facility] ED at this time. She is likely in need of fluids, repeat CT with contrast to evaluate for any developing complications, and pain management. I have called Triage at [facility] to let them know.  We will follow up with patient as an outpatient once this acute episode has been adequately managed.

## 2018-08-24 NOTE — Patient Instructions (Signed)
Please go to [facility] Emergency Department for further evaluation and management of your pancreatitis. You may get admitted.  We will follow up with you as an out patient after this acute episode resolves for further management of chronic pancreatitis.   Aliene Altes, PA-C [facility]

## 2018-08-24 NOTE — ED Triage Notes (Signed)
Pt sent by her doctor for abd pain with nausea and pain she does have pancreatitis and her doctor sent her for that.

## 2018-08-27 NOTE — Progress Notes (Signed)
CC'D TO PCP °

## 2018-10-02 ENCOUNTER — Encounter: Payer: Self-pay | Admitting: Family Medicine

## 2018-10-02 ENCOUNTER — Other Ambulatory Visit: Payer: Self-pay

## 2018-10-02 ENCOUNTER — Ambulatory Visit (INDEPENDENT_AMBULATORY_CARE_PROVIDER_SITE_OTHER): Payer: Medicare Other | Admitting: Family Medicine

## 2018-10-02 VITALS — BP 136/70 | HR 86 | Temp 98.1°F | Resp 18 | Ht 64.0 in | Wt 150.0 lb

## 2018-10-02 DIAGNOSIS — J310 Chronic rhinitis: Secondary | ICD-10-CM | POA: Diagnosis not present

## 2018-10-02 DIAGNOSIS — I10 Essential (primary) hypertension: Secondary | ICD-10-CM

## 2018-10-02 DIAGNOSIS — E119 Type 2 diabetes mellitus without complications: Secondary | ICD-10-CM

## 2018-10-02 MED ORDER — LISINOPRIL 20 MG PO TABS: 20.0000 mg | ORAL_TABLET | Freq: Every day | ORAL | 3 refills | Status: DC

## 2018-10-02 MED ORDER — LEVOCETIRIZINE DIHYDROCHLORIDE 5 MG PO TABS: 5.0000 mg | ORAL_TABLET | Freq: Every evening | ORAL | 2 refills | Status: DC

## 2018-10-02 NOTE — Progress Notes (Signed)
Patient ID: Jennifer Porter, female    DOB: 1945/10/17, 73 y.o.   MRN: [id number]  PCP: Danelle Berry, PA-C  Chief Complaint  Patient presents with  . Blood Sugar Problem    pt states running high for several weeks    Subjective:   Jennifer Porter is a 73 y.o. female, presents to clinic with CC of high blood sugars. On XR metformin only in the morning  Blood sugars around 160 - mostly fasting (range 130-250) Has been to GI, still having work up and treatment for pancreatitis and GERD.  No polyuria, polydipsia, nocturia, weight loss, dry mouth, weakness.  She believes metformin did not cause her abd pain but pancreatitis and GERD did.     She is still having nasal sx with pain, congestion and drainage, chronic, abx in the past did not help much.    Patient Active Problem List   Diagnosis Date Noted  . Acute on chronic pancreatitis (HCC) 08/24/2018  . Loose stools 08/24/2018  . Pancreatic calcification 08/22/2018  . Acute pancreatitis without infection or necrosis 08/09/2018  . Sigmoid diverticulosis 08/09/2018  . 3-vessel CAD 08/09/2018  . Aortic atherosclerosis (HCC) 08/09/2018  . Myelolipoma of right adrenal gland 08/09/2018  . Renal cyst, right 08/09/2018  . Hormone replacement therapy 01/12/2017  . Hot flashes due to menopause 01/12/2017  . Hypomagnesemia 06/27/2016  . Diarrhea 03/28/2016  . Vitamin D deficiency 10/03/2014  . Left knee pain 07/02/2014  . Hypertension   . Gastroesophageal reflux disease   . Diabetes mellitus without complication (HCC)   . Allergy   . Asthma   . H/O: gout   . Hyperlipidemia      Prior to Admission medications   Medication Sig Start Date End Date Taking? Authorizing Provider  albuterol (PROVENTIL HFA;VENTOLIN HFA) 108 (90 Base) MCG/ACT inhaler Inhale 2 puffs into the lungs every 6 (six) hours as needed for wheezing or shortness of breath. 05/17/16  Yes LaPlace, Velna Hatchet, MD  aspirin 81 MG tablet Take 81 mg by mouth daily.   Yes  [provider]  cholecalciferol (VITAMIN D) 1000 units tablet Take 1,000 Units by mouth daily.   Yes [provider]  Choline Fenofibrate (FENOFIBRIC ACID) 135 MG CPDR TAKE ONE CAPSULE BY MOUTH EVERY NIGHT AT BEDTIME 07/16/18  Yes Davanna He, PA-C  fluticasone (FLONASE) 50 MCG/ACT nasal spray Place 2 sprays into both nostrils daily as needed. 07/05/18  Yes Danelle Berry, PA-C  gabapentin (NEURONTIN) 300 MG capsule TAKE 1 CAPSULE BY MOUTH THREE TIMES A DAY Patient taking differently: Take 300 mg by mouth daily.  07/20/17  Yes Dixon, Corrie Dandy B, PA-C  lisinopril (PRINIVIL,ZESTRIL) 20 MG tablet TAKE ONE (1) TABLET BY MOUTH EVERY DAY 04/06/18  Yes Danelle Berry, PA-C  magnesium oxide (MAG-OX) 400 MG tablet Take 400 mg by mouth daily.   Yes [provider]  metFORMIN (GLUCOPHAGE) 500 MG tablet Take 1 tablet (500 mg total) by mouth 2 (two) times daily with a meal. 08/09/18  Yes Danelle Berry, PA-C  pantoprazole (PROTONIX) 40 MG tablet Take 1 tablet (40 mg total) by mouth 2 (two) times daily. 07/24/18  Yes Danelle Berry, PA-C  pravastatin (PRAVACHOL) 10 MG tablet TAKE ONE TABLET (10MG  TOTAL) BY MOUTH ATBEDTIME 07/16/18  Yes Danelle Berry, PA-C  PREMARIN 0.625 MG tablet TAKE ONE (1) TABLET BY MOUTH EVERY DAY 07/16/18  Yes Danelle Berry, PA-C     Allergies  Allergen Reactions  . Codeine Nausea And Vomiting  .  Macrobid [Nitrofurantoin] Hives  . Simvastatin     headache     Family History  Problem Relation Age of Onset  . Stroke Mother   . Diabetes Mother   . Vision loss Mother        eye removed  . Cancer Mother        cancer in eye - removed  . Diabetes Brother   . Heart disease Father   . Colon cancer Sister   . Uterine cancer Paternal Grandmother   . Uterine cancer Maternal Aunt   . Pancreatic cancer Maternal Aunt      Social History   Socioeconomic History  . Marital status: Married    Spouse name: Not on file  . Number of children: Not on file  . Years of  education: Not on file  . Highest education level: Not on file  Occupational History  . Not on file  Social Needs  . Financial resource strain: Not on file  . Food insecurity    Worry: Not on file    Inability: Not on file  . Transportation needs    Medical: Not on file    Non-medical: Not on file  Tobacco Use  . Smoking status: Never Smoker  . Smokeless tobacco: Never Used  Substance and Sexual Activity  . Alcohol use: No  . Drug use: No  . Sexual activity: Not on file  Lifestyle  . Physical activity    Days per week: Not on file    Minutes per session: Not on file  . Stress: Not on file  Relationships  . Social Musician on phone: Not on file    Gets together: Not on file    Attends religious service: Not on file    Active member of club or organization: Not on file    Attends meetings of clubs or organizations: Not on file    Relationship status: Not on file  . Intimate partner violence    Fear of current or ex partner: Not on file    Emotionally abused: Not on file    Physically abused: Not on file    Forced sexual activity: Not on file  Other Topics Concern  . Not on file  Social History Narrative   Retired March 2014.   Did teach Kindergarten at [employer]     Review of Systems  Constitutional: Negative.   HENT: Negative.   Eyes: Negative.   Respiratory: Negative.   Cardiovascular: Negative.   Gastrointestinal: Negative.   Endocrine: Negative.   Genitourinary: Negative.   Musculoskeletal: Negative.   Skin: Negative.   Allergic/Immunologic: Negative.   Neurological: Negative.   Hematological: Negative.   Psychiatric/Behavioral: Negative.   All other systems reviewed and are negative.      Objective:    Vitals:   10/02/18 0950  BP: 136/70  Pulse: 86  Resp: 18  Temp: 98.1 F (36.7 C)  SpO2: 98%  Weight: 150 lb (68 kg)  Height: 5\' 4"  (1.626 m)      Physical Exam Vitals signs and nursing note reviewed.   Constitutional:      General: She is not in acute distress.    Appearance: She is well-developed. She is not ill-appearing, toxic-appearing or diaphoretic.  HENT:     Head: Normocephalic and atraumatic.     Right Ear: Tympanic membrane, ear canal and external ear normal.     Left Ear: External ear normal.     Nose: Nasal tenderness  and congestion present.     Right Sinus: Maxillary sinus tenderness and frontal sinus tenderness present.     Left Sinus: Maxillary sinus tenderness and frontal sinus tenderness present.     Mouth/Throat:     Mouth: Mucous membranes are moist.     Pharynx: Oropharynx is clear. No posterior oropharyngeal erythema.  Eyes:     General:        Right eye: No discharge.        Left eye: No discharge.     Conjunctiva/sclera: Conjunctivae normal.  Neck:     Trachea: No tracheal deviation.  Cardiovascular:     Rate and Rhythm: Normal rate and regular rhythm.     Pulses: Normal pulses.     Heart sounds: Normal heart sounds. No murmur. No friction rub. No gallop.   Pulmonary:     Effort: Pulmonary effort is normal. No respiratory distress.     Breath sounds: Normal breath sounds. No stridor.  Musculoskeletal: Normal range of motion.     Right lower leg: No edema.     Left lower leg: No edema.  Skin:    General: Skin is warm and dry.     Capillary Refill: Capillary refill takes less than 2 seconds.     Coloration: Skin is not jaundiced.     Findings: No bruising or rash.  Neurological:     Mental Status: She is alert.     Motor: No abnormal muscle tone.     Coordination: Coordination normal.  Psychiatric:        Mood and Affect: Mood is depressed.        Behavior: Behavior normal.           Assessment & Plan:   Problem List Items Addressed This Visit      Cardiovascular and Mediastinum   Hypertension    BP controlled, excellent compliance with meds, no SE Labs done two months ago Refill and continue lisinopril 20 mg daily in am      Relevant  Medications   lisinopril (ZESTRIL) 20 MG tablet   Other Relevant Orders   COMPLETE METABOLIC PANEL WITH GFR   Hemoglobin A1c   Lipid Panel     Endocrine   Diabetes mellitus without complication (HCC)    Pt here today with concerns of worsening blood sugar, last A1C 7.5 2 months ago, will do labs again in one month. Anticipating GI assessment and tx of pancreas, suspect pancreatic pathology causing some worse blood sugar Will titrate up metformin to 1000 mg BID if she tolerates Meds sent to pharmacy.      Relevant Medications   lisinopril (ZESTRIL) 20 MG tablet   metFORMIN (GLUCOPHAGE) 500 MG tablet   metFORMIN (GLUCOPHAGE) 1000 MG tablet (Start on 11/09/2018)   Other Relevant Orders   COMPLETE METABOLIC PANEL WITH GFR   Hemoglobin A1c   Lipid Panel    Other Visit Diagnoses    Chronic rhinitis    -  Primary   recurrent, change to xyzal, f/up ENT, treated multiple times in the past for acute bacterial sinusitis, and tx allergies, no improvement   Relevant Medications   levocetirizine (XYZAL) 5 MG tablet   Other Relevant Orders   Ambulatory referral to ENT        BP stable, needs refill, labs done 2 months ago, will come to repeat in another month for DM, HLD, and check CMP.  Refill on lisinopril given today  Blood sugar higher even without eating - need  to increase metformin - f/up with pharmacy for which med she is on XR vs metformin BID   F/up with GI for pancreas - pancreatic enzyme supplement?  Next PCP f/up would be in ~ 4 months for routine f/up, close care gaps etc.  Danelle Berry, PA-C 10/02/18 10:21 AM

## 2018-10-02 NOTE — Patient Instructions (Addendum)
Come back in one month to do fasting labs to recheck your sugars  Fasting labs Mid august  Follow up with GI to continue working on your stomach/pancreas issues.  I will call you after calling your pharmacy to figure out which diabetes metfomin medicine to have you on, cause we will want to increase the dose.    Routine follow up in 4-6 Months  Can come to [facility] in [place], [place] (call in August to make an appointment) OR make an appointment here with Dr. Buelah Manis or Dr. Dennard Schaumann for your next follow up.

## 2018-10-09 MED ORDER — METFORMIN HCL 1000 MG PO TABS: 1000.0000 mg | ORAL_TABLET | Freq: Two times a day (BID) | ORAL | 1 refills | Status: DC

## 2018-10-09 MED ORDER — METFORMIN HCL 500 MG PO TABS: 1000.0000 mg | ORAL_TABLET | Freq: Two times a day (BID) | ORAL | 0 refills | Status: DC

## 2018-10-09 NOTE — Assessment & Plan Note (Signed)
BP controlled, excellent compliance with meds, no SE Labs done two months ago Refill and continue lisinopril 20 mg daily in am

## 2018-10-09 NOTE — Assessment & Plan Note (Signed)
Pt here today with concerns of worsening blood sugar, last A1C 7.5 2 months ago, will do labs again in one month. Anticipating GI assessment and tx of pancreas, suspect pancreatic pathology causing some worse blood sugar Will titrate up metformin to 1000 mg BID if she tolerates Meds sent to pharmacy.

## 2018-10-15 ENCOUNTER — Other Ambulatory Visit: Payer: Self-pay | Admitting: Family Medicine

## 2018-10-17 ENCOUNTER — Ambulatory Visit: Payer: Medicare Other | Admitting: Nurse Practitioner

## 2018-10-19 ENCOUNTER — Encounter

## 2018-10-19 ENCOUNTER — Other Ambulatory Visit: Payer: Self-pay

## 2018-10-19 ENCOUNTER — Telehealth: Payer: Self-pay | Admitting: Gastroenterology

## 2018-10-19 ENCOUNTER — Encounter: Payer: Self-pay | Admitting: Gastroenterology

## 2018-10-19 ENCOUNTER — Ambulatory Visit (INDEPENDENT_AMBULATORY_CARE_PROVIDER_SITE_OTHER): Payer: Medicare Other | Admitting: Gastroenterology

## 2018-10-19 VITALS — BP 149/68 | HR 100 | Temp 97.0°F | Ht 64.0 in | Wt 145.4 lb

## 2018-10-19 DIAGNOSIS — K219 Gastro-esophageal reflux disease without esophagitis: Secondary | ICD-10-CM

## 2018-10-19 DIAGNOSIS — K859 Acute pancreatitis without necrosis or infection, unspecified: Secondary | ICD-10-CM

## 2018-10-19 DIAGNOSIS — K861 Other chronic pancreatitis: Secondary | ICD-10-CM

## 2018-10-19 MED ORDER — FAMOTIDINE 20 MG PO TABS: 20.0000 mg | ORAL_TABLET | Freq: Two times a day (BID) | ORAL | 5 refills | Status: DC

## 2018-10-19 MED ORDER — PANCRELIPASE (LIP-PROT-AMYL) 36000-114000 UNITS PO CPEP: ORAL_CAPSULE | ORAL | 5 refills | Status: DC

## 2018-10-19 NOTE — H&P (View-Only) (Signed)
Primary Care Physician: Alycia Rossetti, MD  Primary Gastroenterologist:  Garfield Cornea, MD   Chief Complaint  Patient presents with  . Abdominal Pain    upper abd, comes/goes x few months. about 95% better  . Gastroesophageal Reflux    HPI: Jennifer Porter is a 72 y.o. female here for follow-up.  She was initially seen by [facility] back in June.  For years she has had issues with her stomach. Has been treated for GERD chronically off/on. No prior EGD. She has had issues with postprandial abdominal pain and loose stools. Last colonoscopy about 11 years ago and unremarkable. Advised for 10 year follow up.   In May, she had more severe bout with abdominal pain. At that time, lipase elevated at 340, CT suggesting chronic pancreatitis with punctate calcifications along the pancreatic parenchyma, prior cholecystectomy, no biliary abnormalities.  Triglycerides 167.  Chronically on Fenofibric acid and pravastatin. No alcohol use. No new medications.  FH of pancreatic cancer, maternal aunt. Mother had severely elevated triglycerides but no one with h/o pancreatitis.   When she was seen back in June she was complaining of ongoing abdominal pain, poor oral intake and she was referred to the ED.  She had another CT abdomen pelvis with contrast on June 5 showing mild peripancreatic inflammatory changes around the pancreatic head.  The CT reported normal gallbladder however she has a history of cholecystectomy.  Repeat labs with lipase down to 72, LFTs normal, CBC normal.  She states she feels maybe 95% better but still has abdominal pain. Some days better than others. She continues to struggle with eating. She has lost 15 pounds since this started in May. She is having increasing difficulty controlling her sugars. In later part of June and first 1/2 of July, her glucose was consistently over 200. She was concerned that pantoprazole may be playing an affect so she stopped it.   She currently is having  more reflux symptoms. Now looking back she realizes she has had chronic abdominal pain off/on for years as well as postprandially loose stools. Frequent GERD. Significant "flares" which she thought was gerd but now wonders if it was pancreatitis.  Last couple of years symptoms have been worse. Denies dysphagia. No melena, brbpr. Heme negative in 07/2018.     Current Outpatient Medications  Medication Sig Dispense Refill  . albuterol (PROVENTIL HFA;VENTOLIN HFA) 108 (90 Base) MCG/ACT inhaler Inhale 2 puffs into the lungs every 6 (six) hours as needed for wheezing or shortness of breath. 1 Inhaler 0  . aspirin 81 MG tablet Take 81 mg by mouth daily.    . cholecalciferol (VITAMIN D) 1000 units tablet Take 1,000 Units by mouth daily.    . Choline Fenofibrate (FENOFIBRIC ACID) 135 MG CPDR TAKE ONE CAPSULE BY MOUTH EVERY NIGHT AT BEDTIME 90 capsule 0  . fluticasone (FLONASE) 50 MCG/ACT nasal spray Place 2 sprays into both nostrils daily as needed. 16 g 5  . gabapentin (NEURONTIN) 300 MG capsule TAKE 1 CAPSULE BY MOUTH THREE TIMES A DAY (Patient taking differently: Take 300 mg by mouth daily. ) 90 capsule 1  . lisinopril (ZESTRIL) 20 MG tablet Take 1 tablet (20 mg total) by mouth daily. 90 tablet 3  . magnesium oxide (MAG-OX) 400 MG tablet Take 400 mg by mouth daily.    . metFORMIN (GLUCOPHAGE) 500 MG tablet Take 2 tablets (1,000 mg total) by mouth 2 (two) times daily with a meal. (Patient taking differently: Take 500 mg  by mouth every evening. ) 120 tablet 0  . metFORMIN (GLUCOPHAGE-XR) 750 MG 24 hr tablet Take 1 tablet by mouth every morning.    . pantoprazole (PROTONIX) 40 MG tablet Take 1 tablet (40 mg total) by mouth 2 (two) times daily. (Patient taking differently: Take 40 mg by mouth as needed. 1-2 times per week) 60 tablet 2  . pravastatin (PRAVACHOL) 10 MG tablet TAKE ONE TABLET (10MG  TOTAL) BY MOUTH ATBEDTIME 90 tablet 0  . PREMARIN 0.625 MG tablet TAKE ONE (1) TABLET BY MOUTH EVERY DAY 90 tablet  0   No current facility-administered medications for this visit.     Allergies as of 10/19/2018 - Review Complete 10/19/2018  Allergen Reaction Noted  . Codeine Nausea And Vomiting 11/28/2012  . Macrobid [nitrofurantoin] Hives 11/28/2012  . Simvastatin  10/26/2017    ROS:  General: Negative for   fever, chills, fatigue, weakness. See hpi. ENT: Negative for hoarseness, difficulty swallowing , nasal congestion. CV: Negative for chest pain, angina, palpitations, dyspnea on exertion, peripheral edema.  Respiratory: Negative for dyspnea at rest, dyspnea on exertion, cough, sputum, wheezing.  GI: See history of present illness. GU:  Negative for dysuria, hematuria, urinary incontinence, urinary frequency, nocturnal urination.  Endo: Negative for unusual weight change. See hpi   Physical Examination:   BP (!) 149/68   Pulse 100   Temp (!) 97 F (36.1 C) (Oral)   Ht 5\' 4"  (1.626 m)   Wt 145 lb 6.4 oz (66 kg)   BMI 24.96 kg/m   General: Well-nourished, well-developed in no acute distress.  Eyes: No icterus. Mouth: Oropharyngeal mucosa moist and pink , no lesions erythema or exudate. Lungs: Clear to auscultation bilaterally.  Heart: Regular rate and rhythm, no murmurs rubs or gallops.  Abdomen: Bowel sounds are normal, nontender, nondistended, no hepatosplenomegaly or masses, no abdominal bruits or hernia , no rebound or guarding.   Extremities: No lower extremity edema. No clubbing or deformities. Neuro: Alert and oriented x 4   Skin: Warm and dry, no jaundice.   Psych: Alert and cooperative, normal mood and affect.  Labs:  Lab Results  Component Value Date   LIPASE 72 (H) 08/24/2018   Lab Results  Component Value Date   CHOL 167 08/01/2018   HDL 49 (L) 08/01/2018   LDLCALC 91 08/01/2018   TRIG 167 (H) 08/01/2018   CHOLHDL 3.4 08/01/2018   Lab Results  Component Value Date   CREATININE 0.86 08/24/2018   BUN 16 08/24/2018   NA 140 08/24/2018   K 3.8 08/24/2018    CL 102 08/24/2018   CO2 23 08/24/2018   Lab Results  Component Value Date   ALT 17 08/24/2018   AST 20 08/24/2018   ALKPHOS 17 (L) 08/24/2018   BILITOT 0.7 08/24/2018   Lab Results  Component Value Date   WBC 7.3 08/24/2018   HGB 12.4 08/24/2018   HCT 37.3 08/24/2018   MCV 91.2 08/24/2018   PLT 242 08/24/2018    Imaging Studies: No results found.

## 2018-10-19 NOTE — Telephone Encounter (Signed)
EUS scheduled for 11/08/18 and COVID 11/05/18 Left message on machine to call back

## 2018-10-19 NOTE — Telephone Encounter (Signed)
-----   Message from Mahala Menghini, PA-C sent at 10/19/2018 12:56 PM EDT ----- Hi! This is a nice 73 y/o lady with chronic GERD, intermittent flares of abdominal pain, who we recently saw for new diagnosis of acute on chronic pancreatitis. She had bump in lipase and CT imaging consistent with acute on chronic pancreatitis. Etiology unclear at this time.   She has lost 15 pounds in setting of recent illness. Maternal aunt, pancreatic cancer.   Would you consider EUS to rule out underlying malignancy as well as to evaluate biliary/pancreatic ducts for any etiology for her pancreatitis?  She also has had chronic and somewhat refractory GERD and would benefit from EGD in the future, maybe at time of EUS if deemed necessary?  I appreciate any input!  Magda Paganini

## 2018-10-19 NOTE — Assessment & Plan Note (Signed)
Chronic GERD, currently not on medication because of her concerns of interference between pantoprazole and metformin.  I do not see a correlation.  Suspect poorly managed glucose related to acute on chronic pancreatitis.  Would encourage her to resume pantoprazole 40 mg once daily.  If she is not comfortable, she can try Pepcid 40 mg twice daily.  Prescription provided.  At some point would consider upper endoscopy, possibly at time of EUS if indicated.  Further recommendations to follow.

## 2018-10-19 NOTE — Telephone Encounter (Signed)
Magda Paganini I looked at her to recent CT scans, lab tests and your office notes.  I do think that endoscopic ultrasound evaluation is probably a good next step.  I have some concern for possible underlying neoplasm especially given her weight loss.  We will reach out to her to schedule it.  At the same time as the EUS, we will be sure to get a good evaluation of her upper GI tract given her chronic GERD symptoms.  Thanks   Patty, She needs upper endoscopic ultrasound.  First available appointment with myself or Dr. Rush Landmark, indication recent mild acute pancreatitis of unclear etiology, weight loss.  Thanks   Wynetta Fines

## 2018-10-19 NOTE — Patient Instructions (Signed)
1. Start Creon two with meals and one with snacks up to 8 per day. Take with first bite of food.  2. I don't see an interaction between metformin and pantoprazole when I look it up. I would recommend either back down to pantoprazole once daily OR start Pepcid twice daily. I sent in RX for Pepcid if you choose to do that.  3. We will be in touch once Dr. Ardis Hughs reviews your case regarding possible ultrasound from inside your stomach.

## 2018-10-19 NOTE — Progress Notes (Signed)
Primary Care Physician: Alycia Rossetti, MD  Primary Gastroenterologist:  Garfield Cornea, MD   Chief Complaint  Patient presents with  . Abdominal Pain    upper abd, comes/goes x few months. about 95% better  . Gastroesophageal Reflux    HPI: Jennifer Porter is a 73 y.o. female here for follow-up.  She was initially seen by [facility] back in June.  For years she has had issues with her stomach. Has been treated for GERD chronically off/on. No prior EGD. She has had issues with postprandial abdominal pain and loose stools. Last colonoscopy about 11 years ago and unremarkable. Advised for 10 year follow up.   In May, she had more severe bout with abdominal pain. At that time, lipase elevated at 340, CT suggesting chronic pancreatitis with punctate calcifications along the pancreatic parenchyma, prior cholecystectomy, no biliary abnormalities.  Triglycerides 167.  Chronically on Fenofibric acid and pravastatin. No alcohol use. No new medications.  FH of pancreatic cancer, maternal aunt. Mother had severely elevated triglycerides but no one with h/o pancreatitis.   When she was seen back in June she was complaining of ongoing abdominal pain, poor oral intake and she was referred to the ED.  She had another CT abdomen pelvis with contrast on June 5 showing mild peripancreatic inflammatory changes around the pancreatic head.  The CT reported normal gallbladder however she has a history of cholecystectomy.  Repeat labs with lipase down to 72, LFTs normal, CBC normal.  She states she feels maybe 95% better but still has abdominal pain. Some days better than others. She continues to struggle with eating. She has lost 15 pounds since this started in May. She is having increasing difficulty controlling her sugars. In later part of June and first 1/2 of July, her glucose was consistently over 200. She was concerned that pantoprazole may be playing an affect so she stopped it.   She currently is having  more reflux symptoms. Now looking back she realizes she has had chronic abdominal pain off/on for years as well as postprandially loose stools. Frequent GERD. Significant "flares" which she thought was gerd but now wonders if it was pancreatitis.  Last couple of years symptoms have been worse. Denies dysphagia. No melena, brbpr. Heme negative in 07/2018.     Current Outpatient Medications  Medication Sig Dispense Refill  . albuterol (PROVENTIL HFA;VENTOLIN HFA) 108 (90 Base) MCG/ACT inhaler Inhale 2 puffs into the lungs every 6 (six) hours as needed for wheezing or shortness of breath. 1 Inhaler 0  . aspirin 81 MG tablet Take 81 mg by mouth daily.    . cholecalciferol (VITAMIN D) 1000 units tablet Take 1,000 Units by mouth daily.    . Choline Fenofibrate (FENOFIBRIC ACID) 135 MG CPDR TAKE ONE CAPSULE BY MOUTH EVERY NIGHT AT BEDTIME 90 capsule 0  . fluticasone (FLONASE) 50 MCG/ACT nasal spray Place 2 sprays into both nostrils daily as needed. 16 g 5  . gabapentin (NEURONTIN) 300 MG capsule TAKE 1 CAPSULE BY MOUTH THREE TIMES A DAY (Patient taking differently: Take 300 mg by mouth daily. ) 90 capsule 1  . lisinopril (ZESTRIL) 20 MG tablet Take 1 tablet (20 mg total) by mouth daily. 90 tablet 3  . magnesium oxide (MAG-OX) 400 MG tablet Take 400 mg by mouth daily.    . metFORMIN (GLUCOPHAGE) 500 MG tablet Take 2 tablets (1,000 mg total) by mouth 2 (two) times daily with a meal. (Patient taking differently: Take 500 mg  by mouth every evening. ) 120 tablet 0  . metFORMIN (GLUCOPHAGE-XR) 750 MG 24 hr tablet Take 1 tablet by mouth every morning.    . pantoprazole (PROTONIX) 40 MG tablet Take 1 tablet (40 mg total) by mouth 2 (two) times daily. (Patient taking differently: Take 40 mg by mouth as needed. 1-2 times per week) 60 tablet 2  . pravastatin (PRAVACHOL) 10 MG tablet TAKE ONE TABLET (10MG  TOTAL) BY MOUTH ATBEDTIME 90 tablet 0  . PREMARIN 0.625 MG tablet TAKE ONE (1) TABLET BY MOUTH EVERY DAY 90 tablet  0   No current facility-administered medications for this visit.     Allergies as of 10/19/2018 - Review Complete 10/19/2018  Allergen Reaction Noted  . Codeine Nausea And Vomiting 11/28/2012  . Macrobid [nitrofurantoin] Hives 11/28/2012  . Simvastatin  10/26/2017    ROS:  General: Negative for   fever, chills, fatigue, weakness. See hpi. ENT: Negative for hoarseness, difficulty swallowing , nasal congestion. CV: Negative for chest pain, angina, palpitations, dyspnea on exertion, peripheral edema.  Respiratory: Negative for dyspnea at rest, dyspnea on exertion, cough, sputum, wheezing.  GI: See history of present illness. GU:  Negative for dysuria, hematuria, urinary incontinence, urinary frequency, nocturnal urination.  Endo: Negative for unusual weight change. See hpi   Physical Examination:   BP (!) 149/68   Pulse 100   Temp (!) 97 F (36.1 C) (Oral)   Ht 5\' 4"  (1.626 m)   Wt 145 lb 6.4 oz (66 kg)   BMI 24.96 kg/m   General: Well-nourished, well-developed in no acute distress.  Eyes: No icterus. Mouth: Oropharyngeal mucosa moist and pink , no lesions erythema or exudate. Lungs: Clear to auscultation bilaterally.  Heart: Regular rate and rhythm, no murmurs rubs or gallops.  Abdomen: Bowel sounds are normal, nontender, nondistended, no hepatosplenomegaly or masses, no abdominal bruits or hernia , no rebound or guarding.   Extremities: No lower extremity edema. No clubbing or deformities. Neuro: Alert and oriented x 4   Skin: Warm and dry, no jaundice.   Psych: Alert and cooperative, normal mood and affect.  Labs:  Lab Results  Component Value Date   LIPASE 72 (H) 08/24/2018   Lab Results  Component Value Date   CHOL 167 08/01/2018   HDL 49 (L) 08/01/2018   LDLCALC 91 08/01/2018   TRIG 167 (H) 08/01/2018   CHOLHDL 3.4 08/01/2018   Lab Results  Component Value Date   CREATININE 0.86 08/24/2018   BUN 16 08/24/2018   NA 140 08/24/2018   K 3.8 08/24/2018    CL 102 08/24/2018   CO2 23 08/24/2018   Lab Results  Component Value Date   ALT 17 08/24/2018   AST 20 08/24/2018   ALKPHOS 17 (L) 08/24/2018   BILITOT 0.7 08/24/2018   Lab Results  Component Value Date   WBC 7.3 08/24/2018   HGB 12.4 08/24/2018   HCT 37.3 08/24/2018   MCV 91.2 08/24/2018   PLT 242 08/24/2018    Imaging Studies: No results found.

## 2018-10-19 NOTE — Assessment & Plan Note (Signed)
Pleasant 73 year old female with acute on chronic pancreatitis initially diagnosed back in May.  No prior episodes of pancreatitis documented although patient reports chronic intermittent flares of pain which she always felt was reflux related.  CT imaging twice in the past couple months as outlined.  Gallbladder has been removed remotely.  No evidence of biliary dilatation on imaging.  Triglycerides well managed.  No hypercalcemia.  No known family history of pancreatitis.  Never consumed alcohol.  No new medications.  Etiology unclear.  She is starting to feel better with less abdominal pain, stools not as loose.  Continues to have difficulty managing her hyperglycemia although some improvement in last 2 weeks.  We will plan to add pancreatic enzymes.  Discussed with patient, etiology of her pancreatitis is not clear at this time.  She may benefit from further imaging to get a better look at her pancreas and exclude any underlying malignancy, ductal stones, etc.  We will ask our colleagues out of our GI if EUS would be helpful.  If so would recommend EGD at the same time to evaluate chronic GERD which is been poorly managed.  Further recommendations to follow.

## 2018-10-19 NOTE — Telephone Encounter (Signed)
The patient has been notified of this information and all questions answered.   EUS scheduled, pt instructed and medications reviewed.  Patient instructions mailed to home.  Patient to call with any questions or concerns.

## 2018-10-22 NOTE — Telephone Encounter (Signed)
Correspondence noted. Thank You!

## 2018-10-22 NOTE — Progress Notes (Signed)
cc'ed to pcp °

## 2018-10-25 ENCOUNTER — Other Ambulatory Visit: Payer: Self-pay | Admitting: Family Medicine

## 2018-11-05 ENCOUNTER — Other Ambulatory Visit (HOSPITAL_COMMUNITY)
Admission: RE | Admit: 2018-11-05 | Discharge: 2018-11-05 | Disposition: A | Discharge status: Home / Self Care | Payer: Medicare Other | Source: Ambulatory Visit | Attending: Gastroenterology | Admitting: Gastroenterology

## 2018-11-05 DIAGNOSIS — Z20828 Contact with and (suspected) exposure to other viral communicable diseases: Secondary | ICD-10-CM | POA: Insufficient documentation

## 2018-11-05 DIAGNOSIS — Z01812 Encounter for preprocedural laboratory examination: Secondary | ICD-10-CM | POA: Insufficient documentation

## 2018-11-05 LAB — SARS CORONAVIRUS 2 (TAT 6-24 HRS): SARS Coronavirus 2: NEGATIVE

## 2018-11-06 ENCOUNTER — Other Ambulatory Visit: Payer: Medicare Other

## 2018-11-07 ENCOUNTER — Other Ambulatory Visit: Payer: Self-pay

## 2018-11-07 ENCOUNTER — Encounter (HOSPITAL_COMMUNITY): Payer: Self-pay | Admitting: *Deleted

## 2018-11-08 ENCOUNTER — Telehealth: Payer: Self-pay

## 2018-11-08 ENCOUNTER — Ambulatory Visit (HOSPITAL_COMMUNITY): Payer: Medicare Other | Admitting: Anesthesiology

## 2018-11-08 ENCOUNTER — Ambulatory Visit (INDEPENDENT_AMBULATORY_CARE_PROVIDER_SITE_OTHER): Payer: Medicare Other | Admitting: Family Medicine

## 2018-11-08 ENCOUNTER — Telehealth: Payer: Self-pay | Admitting: *Deleted

## 2018-11-08 ENCOUNTER — Encounter (HOSPITAL_COMMUNITY): Payer: Self-pay | Admitting: *Deleted

## 2018-11-08 ENCOUNTER — Ambulatory Visit (HOSPITAL_COMMUNITY)
Admission: RE | Admit: 2018-11-08 | Discharge: 2018-11-08 | Disposition: A | Discharge status: Home / Self Care | Payer: Medicare Other | Attending: Gastroenterology | Admitting: Gastroenterology

## 2018-11-08 ENCOUNTER — Encounter (HOSPITAL_COMMUNITY)
Admission: RE | Disposition: A | Discharge status: Home / Self Care | Payer: Self-pay | Source: Home / Self Care | Attending: Gastroenterology

## 2018-11-08 DIAGNOSIS — K30 Functional dyspepsia: Secondary | ICD-10-CM

## 2018-11-08 DIAGNOSIS — R1013 Epigastric pain: Secondary | ICD-10-CM | POA: Diagnosis not present

## 2018-11-08 DIAGNOSIS — J45909 Unspecified asthma, uncomplicated: Secondary | ICD-10-CM | POA: Insufficient documentation

## 2018-11-08 DIAGNOSIS — Z7984 Long term (current) use of oral hypoglycemic drugs: Secondary | ICD-10-CM | POA: Insufficient documentation

## 2018-11-08 DIAGNOSIS — E785 Hyperlipidemia, unspecified: Secondary | ICD-10-CM | POA: Diagnosis not present

## 2018-11-08 DIAGNOSIS — J019 Acute sinusitis, unspecified: Secondary | ICD-10-CM | POA: Diagnosis not present

## 2018-11-08 DIAGNOSIS — Z7982 Long term (current) use of aspirin: Secondary | ICD-10-CM | POA: Diagnosis not present

## 2018-11-08 DIAGNOSIS — K859 Acute pancreatitis without necrosis or infection, unspecified: Secondary | ICD-10-CM

## 2018-11-08 DIAGNOSIS — R933 Abnormal findings on diagnostic imaging of other parts of digestive tract: Secondary | ICD-10-CM

## 2018-11-08 DIAGNOSIS — B9689 Other specified bacterial agents as the cause of diseases classified elsewhere: Secondary | ICD-10-CM | POA: Diagnosis not present

## 2018-11-08 DIAGNOSIS — Z79899 Other long term (current) drug therapy: Secondary | ICD-10-CM | POA: Insufficient documentation

## 2018-11-08 DIAGNOSIS — K219 Gastro-esophageal reflux disease without esophagitis: Secondary | ICD-10-CM | POA: Diagnosis not present

## 2018-11-08 DIAGNOSIS — I10 Essential (primary) hypertension: Secondary | ICD-10-CM | POA: Diagnosis not present

## 2018-11-08 DIAGNOSIS — K8689 Other specified diseases of pancreas: Secondary | ICD-10-CM | POA: Diagnosis not present

## 2018-11-08 DIAGNOSIS — E119 Type 2 diabetes mellitus without complications: Secondary | ICD-10-CM | POA: Diagnosis not present

## 2018-11-08 DIAGNOSIS — R109 Unspecified abdominal pain: Secondary | ICD-10-CM | POA: Diagnosis present

## 2018-11-08 DIAGNOSIS — R591 Generalized enlarged lymph nodes: Secondary | ICD-10-CM | POA: Diagnosis not present

## 2018-11-08 HISTORY — DX: Other specified postprocedural states: Z98.890

## 2018-11-08 HISTORY — PX: ESOPHAGOGASTRODUODENOSCOPY (EGD) WITH PROPOFOL: SHX5813

## 2018-11-08 HISTORY — DX: Other complications of anesthesia, initial encounter: T88.59XA

## 2018-11-08 HISTORY — DX: Nausea with vomiting, unspecified: R11.2

## 2018-11-08 HISTORY — PX: EUS: SHX5427

## 2018-11-08 LAB — GLUCOSE, CAPILLARY: Glucose-Capillary: 181 mg/dL — ABNORMAL HIGH (ref 70–99)

## 2018-11-08 SURGERY — UPPER ENDOSCOPIC ULTRASOUND (EUS) RADIAL
Anesthesia: Monitor Anesthesia Care

## 2018-11-08 MED ORDER — LIDOCAINE 2% (20 MG/ML) 5 ML SYRINGE
INTRAMUSCULAR | Status: DC | PRN
  Administered 2018-11-08: 100 mg via INTRAVENOUS

## 2018-11-08 MED ORDER — LACTATED RINGERS IV SOLN
INTRAVENOUS | Status: DC
  Administered 2018-11-08: 1000 mL via INTRAVENOUS

## 2018-11-08 MED ORDER — PROPOFOL 10 MG/ML IV BOLUS
INTRAVENOUS | Status: AC
  Filled 2018-11-08: qty 40

## 2018-11-08 MED ORDER — PROPOFOL 500 MG/50ML IV EMUL
INTRAVENOUS | Status: DC | PRN
  Administered 2018-11-08: 100 ug/kg/min via INTRAVENOUS

## 2018-11-08 MED ORDER — SODIUM CHLORIDE 0.9 % IV SOLN: INTRAVENOUS | Status: DC

## 2018-11-08 MED ORDER — AMOXICILLIN-POT CLAVULANATE 875-125 MG PO TABS: 1.0000 | ORAL_TABLET | Freq: Two times a day (BID) | ORAL | 0 refills | Status: DC

## 2018-11-08 MED ORDER — PROPOFOL 10 MG/ML IV BOLUS
INTRAVENOUS | Status: DC | PRN
  Administered 2018-11-08: 40 mg via INTRAVENOUS

## 2018-11-08 NOTE — Discharge Instructions (Signed)
YOU HAD AN ENDOSCOPIC PROCEDURE TODAY: Refer to the procedure report and other information in the discharge instructions given to you for any specific questions about what was found during the examination. If this information does not answer your questions, please call [place] office at [phone] to clarify.   YOU SHOULD EXPECT: Some feelings of bloating in the abdomen. Passage of more gas than usual. Walking can help get rid of the air that was put into your GI tract during the procedure and reduce the bloating. If you had a lower endoscopy (such as a colonoscopy or flexible sigmoidoscopy) you may notice spotting of blood in your stool or on the toilet paper. Some abdominal soreness may be present for a day or two, also.  DIET: Your first meal following the procedure should be a light meal and then it is ok to progress to your normal diet. A half-sandwich or bowl of soup is an example of a good first meal. Heavy or fried foods are harder to digest and may make you feel nauseous or bloated. Drink plenty of fluids but you should avoid alcoholic beverages for 24 hours. If you had a esophageal dilation, please see attached instructions for diet.    ACTIVITY: Your care partner should take you home directly after the procedure. You should plan to take it easy, moving slowly for the rest of the day. You can resume normal activity the day after the procedure however YOU SHOULD NOT DRIVE, use power tools, machinery or perform tasks that involve climbing or major physical exertion for 24 hours (because of the sedation medicines used during the test).   SYMPTOMS TO REPORT IMMEDIATELY: A gastroenterologist can be reached at any hour. Please call [phone]  for any of the following symptoms:   Following upper endoscopy (EGD, EUS, ERCP, esophageal dilation) Vomiting of blood or coffee ground material  New, significant abdominal pain  New, significant chest pain or pain under the shoulder blades  Painful or  persistently difficult swallowing  New shortness of breath  Black, tarry-looking or red, bloody stools  FOLLOW UP:  If any biopsies were taken you will be contacted by phone or by letter within the next 1-3 weeks. Call [phone]  if you have not heard about the biopsies in 3 weeks.  Please also call with any specific questions about appointments or follow up tests.  

## 2018-11-08 NOTE — Anesthesia Preprocedure Evaluation (Signed)
Anesthesia Evaluation  Patient identified by MRN, date of birth, ID band Patient awake    Reviewed: Allergy & Precautions, NPO status , Patient's Chart, lab work & pertinent test results  History of Anesthesia Complications (+) PONV and history of anesthetic complications  Airway Mallampati: I       Dental no notable dental hx. (+) Teeth Intact   Pulmonary asthma ,    Pulmonary exam normal breath sounds clear to auscultation       Cardiovascular hypertension, Pt. on medications Normal cardiovascular exam Rhythm:Regular Rate:Normal     Neuro/Psych negative neurological ROS  negative psych ROS   GI/Hepatic GERD  Medicated,  Endo/Other  diabetes, Type 2, Oral Hypoglycemic Agents  Renal/GU   negative genitourinary   Musculoskeletal negative musculoskeletal ROS (+)   Abdominal Normal abdominal exam  (+)   Peds  Hematology   Anesthesia Other Findings   Reproductive/Obstetrics                             Anesthesia Physical Anesthesia Plan  ASA: II  Anesthesia Plan: MAC   Post-op Pain Management:    Induction:   PONV Risk Score and Plan: 3 and Ondansetron  Airway Management Planned: Natural Airway, Nasal Cannula and Mask  Additional Equipment:   Intra-op Plan:   Post-operative Plan:   Informed Consent: I have reviewed the patients History and Physical, chart, labs and discussed the procedure including the risks, benefits and alternatives for the proposed anesthesia with the patient or authorized representative who has indicated his/her understanding and acceptance.       Plan Discussed with: CRNA  Anesthesia Plan Comments:         Anesthesia Quick Evaluation

## 2018-11-08 NOTE — Transfer of Care (Signed)
Immediate Anesthesia Transfer of Care Note  Patient: Jennifer Porter  Procedure(s) Performed: UPPER ENDOSCOPIC ULTRASOUND (EUS) RADIAL (N/A )  Patient Location: PACU and Endoscopy Unit  Anesthesia Type:MAC  Level of Consciousness: awake, alert  and oriented  Airway & Oxygen Therapy: Patient Spontanous Breathing and Patient connected to nasal cannula oxygen  Post-op Assessment: Report given to RN and Post -op Vital signs reviewed and stable  Post vital signs: Reviewed and stable  Last Vitals:  Vitals Value Taken Time  BP    Temp    Pulse    Resp    SpO2      Last Pain:  Vitals:   11/08/18 0833  TempSrc: Oral         Complications: No apparent anesthesia complications

## 2018-11-08 NOTE — Anesthesia Postprocedure Evaluation (Signed)
Anesthesia Post Note  Patient: Jennifer Porter  Procedure(s) Performed: UPPER ENDOSCOPIC ULTRASOUND (EUS) RADIAL (N/A )     Patient location during evaluation: Endoscopy Anesthesia Type: MAC Level of consciousness: awake Pain management: pain level controlled Vital Signs Assessment: post-procedure vital signs reviewed and stable Respiratory status: spontaneous breathing Cardiovascular status: stable Postop Assessment: no apparent nausea or vomiting Anesthetic complications: no    Last Vitals:  Vitals:   11/08/18 0950 11/08/18 1000  BP: (!) 118/50 (!) 127/55  Pulse:  85  Resp: 18 (!) 23  Temp:    SpO2: 98% 97%    Last Pain:  Vitals:   11/08/18 0942  TempSrc: Oral  PainSc: 5    Pain Goal:                   Huston Foley

## 2018-11-08 NOTE — Telephone Encounter (Signed)
Received call from patient. [phone]~ telephone.   Reports that she has had sinus pressure for weeks that she has been treating at home with OTC medications. Reports that she has facial pressure, ear pain/ pressure, and she also has pressure on her upper teeth. States that sinus are draining, and it worsens at night. Reports only slight cough after sinus drainage.   States that she has tried afrin, nasal saline, sudafed, and flonase with no relief.   Reports that she does currently have pancreatitis, and had esophageal [place] today.   Appointment scheduled for telephone visit at 4:15 today.

## 2018-11-08 NOTE — Interval H&P Note (Signed)
History and Physical Interval Note:  11/08/2018 9:04 AM  Jennifer Porter  has presented today for surgery, with the diagnosis of recent pancreatitis,weight loss.  The various methods of treatment have been discussed with the patient and family. After consideration of risks, benefits and other options for treatment, the patient has consented to  Procedure(s): UPPER ENDOSCOPIC ULTRASOUND (EUS) RADIAL (N/A) as a surgical intervention.  The patient's history has been reviewed, patient examined, no change in status, stable for surgery.  I have reviewed the patient's chart and labs.  Questions were answered to the patient's satisfaction.     [name]

## 2018-11-08 NOTE — Op Note (Signed)
[facility] Patient Name: Jennifer Porter Procedure Date: 11/08/2018 MRN: [id number] Attending MD: Rachael Fee , MD Date of Birth: 09/06/1945 CSN: [id number] Age: 73 Admit Type: Outpatient Procedure:                Upper EUS Indications:              Chronic dyspepsia, recent significant abdominal                            pains led to imaging and labs (CTs suggest                            pancreatic parenchymal calcifications, possible                            'acute on chronic pancreatitis', CMET, CBC normal.                            Lipase briefly elevated into 300s. Providers:                Rachael Fee, MD, Clearnce Sorrel, RN, Roslynn Amble, RN, Arlee Muslim Tech., Technician,                            Stephanie [place], CRNA Referring MD:             Tana Coast, PA and Augusto Gamble MD at [facility] Medicines:                Monitored Anesthesia Care Complications:            No immediate complications. Estimated blood loss:                            None. Estimated Blood Loss:     Estimated blood loss: none. Procedure:                Pre-Anesthesia Assessment:                           - Prior to the procedure, a History and Physical                            was performed, and patient medications and                            allergies were reviewed. The patient's tolerance of                            previous anesthesia was also reviewed. The risks                            and benefits of the procedure and the sedation  options and risks were discussed with the patient.                            All questions were answered, and informed consent                            was obtained. Prior Anticoagulants: The patient has                            taken no previous anticoagulant or antiplatelet                            agents. ASA Grade Assessment: II - A patient with        mild systemic disease. After reviewing the risks                            and benefits, the patient was deemed in                            satisfactory condition to undergo the procedure.                           After obtaining informed consent, the endoscope was                            passed under direct vision. Throughout the                            procedure, the patient's blood pressure, pulse, and                            oxygen saturations were monitored continuously. The                            [id number] ([id number]) Olympus Radial EUS was                            introduced through the mouth, and advanced to the                            second part of duodenum. The upper EUS was                            accomplished without difficulty. The patient                            tolerated the procedure well. Scope In: Scope Out: Findings:      ENDOSCOPIC FINDING: :      The examined esophagus was endoscopically normal.      The entire examined stomach was endoscopically normal.      The examined duodenum was endoscopically normal.      ENDOSONOGRAPHIC FINDING: :      1. No tumors or masses in the pancreas.      2. There  were several small, scattered hyperechoic foci in the       pancreatic parenchyma (head and neck mainly however a few small       scattered foci in the body as well). The parenchyma is slightly more       hypoechoic than usual. There is a hyperechoic, shadowing calcification       (3-27mm across) which appears to be within a non-dilated main pancreatic       duct in the head of the pancreas. PD measures 3.8mm in head and tapers       smoothly throughout the body and tail.      3. No peripancreatic adenopathy.      4. CBD was normal, non-dilated and does not contain any obvious stones.      5. Gallbladder surgically absent.      6. Limited views of the liver, spleen, portal and splenic vessels were       all normal. Impression:                - A few scattered pancreatic parenchymal                            calcifications (head/neck>body/tail) suggest                            possible underlying chronic pancreatitis.                           - There appears to be a stone within the main                            pancreatic duct in the head of the pancreas without                            obvious pancreatic duct obstruction/dilation.                           - CBD normal. Moderate Sedation:      Not Applicable - Patient had care per Anesthesia. Recommendation:           - Discharge patient to home.                           - I will order an MRI with MRCP images and if there                            truly is a stone in her main pancreatic duct then                            could consider ERCP to remove the stone. Perhaps it                            is causing some mild pancreatic insufficiency which                            could account for her dypepsia, loose stools. Procedure Code(s):        ---  Professional ---                           [phone], Esophagogastroduodenoscopy, flexible,                            transoral; with endoscopic ultrasound examination                            limited to the esophagus, stomach or duodenum, and                            adjacent structures Diagnosis Code(s):        --- Professional ---                           R59.1, Generalized enlarged lymph nodes CPT copyright 2019 [employer]. All rights reserved. The codes documented in this report are preliminary and upon coder review may  be revised to meet current compliance requirements. Rachael Fee, MD 11/08/2018 9:52:10 AM This report has been signed electronically. Number of Addenda: 0

## 2018-11-08 NOTE — Progress Notes (Signed)
Subjective:    Patient ID: Jennifer Porter, female    DOB: 12-28-1945, 73 y.o.   MRN: [id number]  HPI Patient is being seen today as a telephone visit.  She consents to be seen via telephone.  Phone call began at 335.  Phone call concluded at 345.  Patient states that she has a sinus infection.  She states that both of her cheekbones hurt.  She feels constant pressure in her cheeks and in her forehead and in the area behind her eyes.  She has postnasal drip and sinus congestion.  She has been using Flonase for several weeks without any relief.  She now has pain and pressure in both of her ears and her ears feel stopped up.  She denies any fever.  She denies any cough.  She denies any chest pain.  She denies any shortness of breath.  She had a negative COVID test on August 17.  She denies any nausea or vomiting.  She does have diarrhea but this is related to her pancreatic insufficiency. Past Medical History:  Diagnosis Date  . Allergy    seasonal  . Asthma   . Complication of anesthesia   . Diabetes mellitus without complication ([place])   . GERD (gastroesophageal reflux disease)    hiatal hernia  . H/O: gout   . Hyperlipidemia   . Hypertension   . PONV (postoperative nausea and vomiting)    Past Surgical History:  Procedure Laterality Date  . ABDOMINAL HYSTERECTOMY    . APPENDECTOMY    . Arthroscopic Left Knee Left 03/22/2003  . CHOLECYSTECTOMY     in 1990s   Current Outpatient Medications on File Prior to Visit  Medication Sig Dispense Refill  . albuterol (PROVENTIL HFA;VENTOLIN HFA) 108 (90 Base) MCG/ACT inhaler Inhale 2 puffs into the lungs every 6 (six) hours as needed for wheezing or shortness of breath. 1 Inhaler 0  . aspirin EC 81 MG tablet Take 81 mg by mouth daily.    . Cholecalciferol (VITAMIN D3) 125 MCG (5000 UT) TABS Take 5,000 Units by mouth daily.    . Choline Fenofibrate (FENOFIBRIC ACID) 135 MG CPDR TAKE ONE CAPSULE BY MOUTH EVERY NIGHT ATBEDTIME (Patient taking  differently: Take 135 mg by mouth at bedtime. ) 90 capsule 0  . fluticasone (FLONASE) 50 MCG/ACT nasal spray Place 2 sprays into both nostrils daily as needed. (Patient taking differently: Place 2 sprays into both nostrils daily as needed for allergies. ) 16 g 5  . gabapentin (NEURONTIN) 300 MG capsule TAKE 1 CAPSULE BY MOUTH THREE TIMES A DAY (Patient taking differently: Take 300 mg by mouth at bedtime. ) 90 capsule 1  . lipase/protease/amylase (CREON) 36000 UNITS CPEP capsule Take two with meals and one with snacks. Up to 8 per day. (Patient taking differently: Take 72,000 Units by mouth 3 (three) times daily with meals. ) 240 capsule 5  . lisinopril (ZESTRIL) 20 MG tablet Take 1 tablet (20 mg total) by mouth daily. 90 tablet 3  . magnesium oxide (MAG-OX) 400 MG tablet Take 400 mg by mouth daily.    . metFORMIN (GLUCOPHAGE) 500 MG tablet Take 2 tablets (1,000 mg total) by mouth 2 (two) times daily with a meal. (Patient taking differently: Take 500 mg by mouth 2 (two) times daily with a meal. ) 120 tablet 0  . pantoprazole (PROTONIX) 40 MG tablet Take 1 tablet (40 mg total) by mouth 2 (two) times daily. (Patient taking differently: Take 40 mg by mouth daily  before breakfast. 1 hour prior to breakfast) 60 tablet 2  . pravastatin (PRAVACHOL) 10 MG tablet TAKE ONE TABLET (10MG  TOTAL) BY MOUTH ATBEDTIME (Patient taking differently: Take 10 mg by mouth at bedtime. ) 90 tablet 0  . PREMARIN 0.625 MG tablet TAKE ONE (1) TABLET BY MOUTH EVERY DAY (Patient taking differently: Take 0.625 mg by mouth daily. ) 90 tablet 0   No current facility-administered medications on file prior to visit.    Allergies  Allergen Reactions  . Codeine Nausea And Vomiting  . Macrobid [Nitrofurantoin] Hives  . Simvastatin     headache   Social History   Socioeconomic History  . Marital status: Married    Spouse name: Not on file  . Number of children: Not on file  . Years of education: Not on file  . Highest education  level: Not on file  Occupational History  . Not on file  Social Needs  . Financial resource strain: Not on file  . Food insecurity    Worry: Not on file    Inability: Not on file  . Transportation needs    Medical: Not on file    Non-medical: Not on file  Tobacco Use  . Smoking status: Never Smoker  . Smokeless tobacco: Never Used  Substance and Sexual Activity  . Alcohol use: No  . Drug use: No  . Sexual activity: Not on file  Lifestyle  . Physical activity    Days per week: Not on file    Minutes per session: Not on file  . Stress: Not on file  Relationships  . Social Herbalist on phone: Not on file    Gets together: Not on file    Attends religious service: Not on file    Active member of club or organization: Not on file    Attends meetings of clubs or organizations: Not on file    Relationship status: Not on file  . Intimate partner violence    Fear of current or ex partner: Not on file    Emotionally abused: Not on file    Physically abused: Not on file    Forced sexual activity: Not on file  Other Topics Concern  . Not on file  Social History Narrative   Retired March 2014.   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      Objective:   Physical Exam  Physical exam could not be performed as patient was seen as a telephone visit however she is speaking full and complete sentences over the telephone with no respiratory distress.      Assessment & Plan:  The encounter diagnosis was Acute bacterial rhinosinusitis. Patient symptoms are consistent with a bacterial sinus infection.  Begin Augmentin 875 mg p.o. twice daily for 10 days.  Use a probiotic with the antibiotic to hopefully prevent diarrhea from the Augmentin.  Reassess if no better in 1 week.

## 2018-11-08 NOTE — Telephone Encounter (Signed)
You have been scheduled for an MRI at [facility] on 11/16/18. Your appointment time is 12 noon. Please arrive 30 minutes prior to your appointment time for registration purposes. Please make certain not to have anything to eat or drink 4 hours prior to your test. In addition, if you have any metal in your body, have a pacemaker or defibrillator, please be sure to let your ordering physician know. This test typically takes 45 minutes to 1 hour to complete. Should you need to reschedule, please call [phone] to do so.   The pt has been advised and all instructions verbally understood

## 2018-11-08 NOTE — Telephone Encounter (Signed)
-----   Message from [name], MD sent at 11/08/2018  9:53 AM EDT ----- Jennifer Porter,  Just completed EUS, see full report in Epic.  No obvious tumors but she may have a stone in her main PD, perhaps causing pancreatic insufficiency and could have caused pancreatitis as well.  I am going to order an MRI, MRCP.  IF a stone is truly in her main PD I will reach out to Dr. Rush Landmark to see what he thinks about pancreatic ERCP.  Thanks  [name], She needs MRI with MRCP,  thanks

## 2018-11-09 ENCOUNTER — Encounter (HOSPITAL_COMMUNITY): Payer: Self-pay | Admitting: Gastroenterology

## 2018-11-12 ENCOUNTER — Ambulatory Visit (INDEPENDENT_AMBULATORY_CARE_PROVIDER_SITE_OTHER): Payer: Medicare Other | Admitting: Otolaryngology

## 2018-11-12 ENCOUNTER — Other Ambulatory Visit: Payer: Self-pay | Admitting: Family Medicine

## 2018-11-12 DIAGNOSIS — E785 Hyperlipidemia, unspecified: Secondary | ICD-10-CM

## 2018-11-13 ENCOUNTER — Other Ambulatory Visit: Payer: Medicare Other

## 2018-11-13 ENCOUNTER — Other Ambulatory Visit: Payer: Self-pay

## 2018-11-13 DIAGNOSIS — E119 Type 2 diabetes mellitus without complications: Secondary | ICD-10-CM

## 2018-11-13 DIAGNOSIS — I10 Essential (primary) hypertension: Secondary | ICD-10-CM | POA: Diagnosis not present

## 2018-11-14 LAB — COMPLETE METABOLIC PANEL WITH GFR
AG Ratio: 1.9 (calc) (ref 1.0–2.5)
ALT: 12 U/L (ref 6–29)
AST: 17 U/L (ref 10–35)
Albumin: 4.3 g/dL (ref 3.6–5.1)
Alkaline phosphatase (APISO): 20 U/L — ABNORMAL LOW (ref 37–153)
BUN: 18 mg/dL (ref 7–25)
CO2: 27 mmol/L (ref 20–32)
Calcium: 9.5 mg/dL (ref 8.6–10.4)
Chloride: 101 mmol/L (ref 98–110)
Creat: 0.88 mg/dL (ref 0.60–0.93)
GFR, Est African American: 76 mL/min/{1.73_m2} (ref >=60)
GFR, Est Non African American: 65 mL/min/{1.73_m2} (ref >=60)
Globulin: 2.3 g/dL (calc) (ref 1.9–3.7)
Glucose, Bld: 202 mg/dL — ABNORMAL HIGH (ref 65–99)
Potassium: 4.5 mmol/L (ref 3.5–5.3)
Sodium: 141 mmol/L (ref 135–146)
Total Bilirubin: 0.4 mg/dL (ref 0.2–1.2)
Total Protein: 6.6 g/dL (ref 6.1–8.1)

## 2018-11-14 LAB — HEMOGLOBIN A1C
Hgb A1c MFr Bld: 9.2 % of total Hgb — ABNORMAL HIGH (ref <5.7)
Mean Plasma Glucose: 217 (calc)
eAG (mmol/L): 12 (calc)

## 2018-11-14 LAB — LIPID PANEL
Cholesterol: 141 mg/dL (ref <200)
HDL: 47 mg/dL — ABNORMAL LOW (ref >=50)
LDL Cholesterol (Calc): 70 mg/dL (calc)
Non-HDL Cholesterol (Calc): 94 mg/dL (calc) (ref <130)
Total CHOL/HDL Ratio: 3 (calc) (ref <5.0)
Triglycerides: 159 mg/dL — ABNORMAL HIGH (ref <150)

## 2018-11-16 ENCOUNTER — Other Ambulatory Visit: Payer: Self-pay | Admitting: Gastroenterology

## 2018-11-16 ENCOUNTER — Ambulatory Visit (HOSPITAL_COMMUNITY): Admission: RE | Admit: 2018-11-16 | Payer: Medicare Other | Source: Ambulatory Visit

## 2018-11-16 DIAGNOSIS — K859 Acute pancreatitis without necrosis or infection, unspecified: Secondary | ICD-10-CM

## 2018-11-16 DIAGNOSIS — R933 Abnormal findings on diagnostic imaging of other parts of digestive tract: Secondary | ICD-10-CM

## 2018-11-20 ENCOUNTER — Other Ambulatory Visit: Payer: Self-pay | Admitting: Gastroenterology

## 2018-11-20 ENCOUNTER — Ambulatory Visit (HOSPITAL_COMMUNITY)
Admission: RE | Admit: 2018-11-20 | Discharge: 2018-11-20 | Disposition: A | Discharge status: Home / Self Care | Payer: Medicare Other | Source: Ambulatory Visit | Attending: Gastroenterology | Admitting: Gastroenterology

## 2018-11-20 ENCOUNTER — Encounter: Payer: Self-pay | Admitting: Family Medicine

## 2018-11-20 ENCOUNTER — Ambulatory Visit (INDEPENDENT_AMBULATORY_CARE_PROVIDER_SITE_OTHER): Payer: Medicare Other | Admitting: Family Medicine

## 2018-11-20 ENCOUNTER — Other Ambulatory Visit: Payer: Self-pay

## 2018-11-20 VITALS — BP 130/62 | HR 86 | Temp 98.3°F | Resp 18 | Ht 64.0 in | Wt 144.4 lb

## 2018-11-20 DIAGNOSIS — R933 Abnormal findings on diagnostic imaging of other parts of digestive tract: Secondary | ICD-10-CM | POA: Insufficient documentation

## 2018-11-20 DIAGNOSIS — D3500 Benign neoplasm of unspecified adrenal gland: Secondary | ICD-10-CM | POA: Diagnosis not present

## 2018-11-20 DIAGNOSIS — K861 Other chronic pancreatitis: Secondary | ICD-10-CM | POA: Insufficient documentation

## 2018-11-20 DIAGNOSIS — K859 Acute pancreatitis without necrosis or infection, unspecified: Secondary | ICD-10-CM

## 2018-11-20 DIAGNOSIS — K8689 Other specified diseases of pancreas: Secondary | ICD-10-CM | POA: Diagnosis not present

## 2018-11-20 DIAGNOSIS — E785 Hyperlipidemia, unspecified: Secondary | ICD-10-CM | POA: Diagnosis not present

## 2018-11-20 DIAGNOSIS — K760 Fatty (change of) liver, not elsewhere classified: Secondary | ICD-10-CM | POA: Diagnosis not present

## 2018-11-20 DIAGNOSIS — E119 Type 2 diabetes mellitus without complications: Secondary | ICD-10-CM

## 2018-11-20 DIAGNOSIS — I10 Essential (primary) hypertension: Secondary | ICD-10-CM

## 2018-11-20 MED ORDER — GADOBUTROL 1 MMOL/ML IV SOLN
6.0000 mL | Freq: Once | INTRAVENOUS | Status: AC | PRN
  Administered 2018-11-20: 6 mL via INTRAVENOUS

## 2018-11-20 MED ORDER — METFORMIN HCL 500 MG PO TABS: 1000.0000 mg | ORAL_TABLET | Freq: Two times a day (BID) | ORAL | 3 refills | Status: DC

## 2018-11-20 NOTE — Progress Notes (Signed)
   Subjective:    Patient ID: Jennifer Porter, female    DOB: 1945-09-25, 73 y.o.   MRN: [id number]  Patient presents for Diabetes (follow up)  HTN- taking lisinopril 20mg  once a day  DM- most recently A1C 9.2% here today to discuss therapy, currently on metformin 500mg  BID cbg-  Fasting 160-278, past 10 days 150-190  She did have antibiotics for sinus infection on 8/20  she has not used nasal spray recently , has zyrtec   cancelled appt with ENT due to GI work up  GI work up for concern for stone in pancreatic duct, has MRI tonight, may need ERCP   she does get some loose stools and abd pain intermittently   Fasting labs reviewed at bedside   Review Of Systems:  GEN- denies fatigue, fever, weight loss,weakness, recent illness HEENT- denies eye drainage, change in vision, nasal discharge, CVS- denies chest pain, palpitations RESP- denies SOB, cough, wheeze ABD- denies N/V, +change in stools,+ abd pain GU- denies dysuria, hematuria, dribbling, incontinence MSK- denies joint pain, muscle aches, injury Neuro- denies headache, dizziness, syncope, seizure activity       Objective:    BP 130/62 (BP Location: Right Arm, Patient Position: Sitting, Cuff Size: Normal)   Pulse 86   Temp 98.3 F (36.8 C) (Oral)   Resp 18   Ht 5\' 4"  (1.626 m)   Wt 144 lb 6.4 oz (65.5 kg)   SpO2 96%   BMI 24.79 kg/m  GEN- NAD, alert and oriented x3 HEENT- PERRL, EOMI, non injected sclera, pink conjunctiva, MMM, oropharynx clear CVS- RRR, no murmur RESP-CTAB ABD-NABS,soft,mild TTP Upper quadrants, no rebound, no guarding EXT- No edema Pulses- Radial, DP- 2+        Assessment & Plan:      Problem List Items Addressed This Visit      Unprioritized   Diabetes mellitus without complication ([place]) - Primary    Uncontrolled, increase metformin to 750mg  BID for now Check CBG fasting Next step add jardiance or insulin therapy      Relevant Medications   metFORMIN (GLUCOPHAGE) 500 MG  tablet   Hyperlipidemia    Improved TG and LDL, continue statin and fenofibrate      Hypertension    Well controlled no changes to ACEI      Pancreatic calcification    Work up per GI, MRI tonight,may need ERCP if stone present          Note: This dictation was prepared with Diplomatic Services operational officer dictation along with smaller Company secretary. Any transcriptional errors that result from this process are unintentional.

## 2018-11-20 NOTE — Assessment & Plan Note (Signed)
Work up per GI, MRI tonight,may need ERCP if stone present

## 2018-11-20 NOTE — Assessment & Plan Note (Signed)
Uncontrolled, increase metformin to 750mg  BID for now Check CBG fasting Next step add jardiance or insulin therapy

## 2018-11-20 NOTE — Patient Instructions (Addendum)
Take 1.5tablets of metformin twice a day  F/U 3 months

## 2018-11-20 NOTE — Assessment & Plan Note (Signed)
Improved TG and LDL, continue statin and fenofibrate

## 2018-11-20 NOTE — Assessment & Plan Note (Signed)
Well controlled no changes to ACEI

## 2018-11-27 ENCOUNTER — Telehealth: Payer: Self-pay | Admitting: Gastroenterology

## 2018-11-27 NOTE — Telephone Encounter (Signed)
Please let patient know she needs f/u with Dr. Gala Romney for chronic pancreatitis, follow up EUS/MRI.

## 2018-11-28 ENCOUNTER — Encounter: Payer: Self-pay | Admitting: Internal Medicine

## 2018-11-28 NOTE — Telephone Encounter (Signed)
SCHEDULED AND LETTER SENT  °

## 2018-12-18 ENCOUNTER — Other Ambulatory Visit: Payer: Self-pay

## 2018-12-18 ENCOUNTER — Ambulatory Visit (INDEPENDENT_AMBULATORY_CARE_PROVIDER_SITE_OTHER): Payer: Medicare Other

## 2018-12-18 ENCOUNTER — Other Ambulatory Visit: Payer: Medicare Other

## 2018-12-18 DIAGNOSIS — Z23 Encounter for immunization: Secondary | ICD-10-CM

## 2018-12-21 ENCOUNTER — Ambulatory Visit (INDEPENDENT_AMBULATORY_CARE_PROVIDER_SITE_OTHER): Payer: Medicare Other | Admitting: Internal Medicine

## 2018-12-21 ENCOUNTER — Encounter: Payer: Self-pay | Admitting: Internal Medicine

## 2018-12-21 ENCOUNTER — Other Ambulatory Visit: Payer: Self-pay

## 2018-12-21 VITALS — BP 145/67 | HR 92 | Temp 97.6°F | Ht 64.0 in | Wt 141.4 lb

## 2018-12-21 DIAGNOSIS — K8689 Other specified diseases of pancreas: Secondary | ICD-10-CM

## 2018-12-21 NOTE — Progress Notes (Signed)
Primary Care Physician:  Alycia Rossetti, MD Primary Gastroenterologist:  Dr. Gala Romney Pre-Procedure History & Physical: HPI:  Jennifer Porter is a 73 y.o. female here for follow-up of abdominal pain felt to be related to chronic pancreatitis.  Status post EUS/MRCP earlier in the year.  Changes consistent with a chronic pancreatitis; no stone disease at play.  Started on pancreatic enzymes and Protonix.  This is been associated with significant improvement in abdominal discomfort.  Unfortunately, blood sugars continue to be suboptimally controlled with her most recent hemoglobin A1c in the 9 range.  Weight down 4-1/2 pounds.  Has only intermittent diarrhea; she does take metformin.  Last colonoscopy about 11 or 12 years ago in [place].  We talked about having 1 more screening at this time.  Patient is not interested at this time.  Past Medical History:  Diagnosis Date  . Allergy    seasonal  . Asthma   . Complication of anesthesia   . Diabetes mellitus without complication ([place])   . GERD (gastroesophageal reflux disease)    hiatal hernia  . H/O: gout   . Hyperlipidemia   . Hypertension   . PONV (postoperative nausea and vomiting)     Past Surgical History:  Procedure Laterality Date  . ABDOMINAL HYSTERECTOMY    . APPENDECTOMY    . Arthroscopic Left Knee Left 03/22/2003  . CHOLECYSTECTOMY     in 1990s  . ESOPHAGOGASTRODUODENOSCOPY (EGD) WITH PROPOFOL N/A 11/08/2018   Procedure: ESOPHAGOGASTRODUODENOSCOPY (EGD) WITH PROPOFOL;  Surgeon: [name], MD;  Location: [facility] END[facility]COPY;  Service: Endoscopy;  Laterality: N/A;  . EUS N/A 11/08/2018   Procedure: UPPER ENDOSCOPIC ULTRASOUND (EUS) RADIAL;  Surgeon: [name], MD;  Location: [facility] ENDOSCOPY;  Service: Endoscopy;  Laterality: N/A;    Prior to Admission medications   Medication Sig Start Date End Date Taking? Authorizing Provider  albuterol (PROVENTIL HFA;VENTOLIN HFA) 108 (90 Base) MCG/ACT inhaler Inhale 2 puffs into the  lungs every 6 (six) hours as needed for wheezing or shortness of breath. 05/17/16  Yes [place], Modena Nunnery, MD  aspirin EC 81 MG tablet Take 81 mg by mouth daily.   Yes [provider]  Cholecalciferol (VITAMIN D3) 125 MCG (5000 UT) TABS Take 5,000 Units by mouth daily.   Yes [provider]  Choline Fenofibrate (FENOFIBRIC ACID) 135 MG CPDR TAKE ONE CAPSULE BY MOUTH EVERY NIGHT ATBEDTIME Patient taking differently: Take 135 mg by mouth at bedtime.  10/25/18  Yes [place], Modena Nunnery, MD  fluticasone [facility]) 50 MCG/ACT nasal spray Place 2 sprays into both nostrils daily as needed. Patient taking differently: Place 2 sprays into both nostrils daily as needed for allergies.  07/05/18  Yes Delsa Grana, PA-C  gabapentin (NEURONTIN) 300 MG capsule TAKE 1 CAPSULE BY MOUTH THREE TIMES A DAY Patient taking differently: Take 300 mg by mouth at bedtime.  07/20/17  Yes Orlena Sheldon, PA-C  lipase/protease/amylase (CREON) 36000 UNITS CPEP capsule Take two with meals and one with snacks. Up to 8 per day. Patient taking differently: Take 72,000 Units by mouth 3 (three) times daily with meals.  10/19/18  Yes Mahala Menghini, PA-C  lisinopril (ZESTRIL) 20 MG tablet Take 1 tablet (20 mg total) by mouth daily. 10/02/18  Yes Delsa Grana, PA-C  magnesium oxide (MAG-OX) 400 MG tablet Take 400 mg by mouth daily.   Yes [provider]  metFORMIN (GLUCOPHAGE) 500 MG tablet Take 2 tablets (1,000 mg total) by mouth 2 (two) times  daily with a meal. 11/20/18  Yes [place], Modena Nunnery, MD  pantoprazole (PROTONIX) 40 MG tablet Take 1 tablet (40 mg total) by mouth 2 (two) times daily. Patient taking differently: Take 40 mg by mouth daily before breakfast. 1 hour prior to breakfast 07/24/18  Yes [place], Leisa, PA-C  pravastatin (PRAVACHOL) 10 MG tablet TAKE ONE TABLET (10MG  TOTAL) BY MOUTH ATBEDTIME 11/12/18  Yes [place], Modena Nunnery, MD  PREMARIN 0.625 MG tablet TAKE ONE (1) TABLET BY MOUTH EVERY DAY Patient taking  differently: Take 0.625 mg by mouth daily.  10/15/18  Yes Alycia Rossetti, MD    Allergies as of 12/21/2018 - Review Complete 12/21/2018  Allergen Reaction Noted  . Codeine Nausea And Vomiting 11/28/2012  . Macrobid [nitrofurantoin] Hives 11/28/2012  . Simvastatin  10/26/2017    Family History  Problem Relation Age of Onset  . Stroke Mother   . Diabetes Mother   . Vision loss Mother        eye removed  . Cancer Mother        cancer in eye - removed  . Diabetes Brother   . Heart disease Father   . Colon cancer Sister   . Uterine cancer Paternal Grandmother   . Uterine cancer Maternal Aunt   . Pancreatic cancer Maternal Aunt     Social History   Socioeconomic History  . Marital status: Married    Spouse name: Not on file  . Number of children: Not on file  . Years of education: Not on file  . Highest education level: Not on file  Occupational History  . Not on file  Social Needs  . Financial resource strain: Not on file  . Food insecurity    Worry: Not on file    Inability: Not on file  . Transportation needs    Medical: Not on file    Non-medical: Not on file  Tobacco Use  . Smoking status: Never Smoker  . Smokeless tobacco: Never Used  Substance and Sexual Activity  . Alcohol use: No  . Drug use: No  . Sexual activity: Not on file  Lifestyle  . Physical activity    Days per week: Not on file    Minutes per session: Not on file  . Stress: Not on file  Relationships  . Social Herbalist on phone: Not on file    Gets together: Not on file    Attends religious service: Not on file    Active member of club or organization: Not on file    Attends meetings of clubs or organizations: Not on file    Relationship status: Not on file  . Intimate partner violence    Fear of current or ex partner: Not on file    Emotionally abused: Not on file    Physically abused: Not on file    Forced sexual activity: Not on file  Other Topics Concern  . 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  Soft and nontender without appreciable  mass or hepatosplenomegaly.  Pulses:  Normal pulses noted. Extremities:  Without clubbing or edema.  Impression/Plan: Very pleasant 73 year old lady with a history of chronic pancreatitis with exocrine/endocrine deficiency.  Notable, significant response to pancreatic enzyme supplements.  Abdominal pain is much improved.  She does take her pancreatic enzyme supplements and other medications "religiously".  Recommendations:  Continue current medical regimen including Creon and Protonix  Strive to improve Hemoglobin A1C  Office visit with [place] 4 mos.    Notice: This dictation was prepared with Dragon dictation along with smaller phrase technology. Any transcriptional errors that result from this process are unintentional and may not be corrected upon review.

## 2018-12-21 NOTE — Patient Instructions (Signed)
Continue current medical regimen including Creon and Protonix  Strive to improve Hemoglobin A1C  Office visit with [place] 4 mos.

## 2019-01-01 ENCOUNTER — Other Ambulatory Visit: Payer: Self-pay | Admitting: Family Medicine

## 2019-01-01 DIAGNOSIS — I10 Essential (primary) hypertension: Secondary | ICD-10-CM

## 2019-01-14 ENCOUNTER — Other Ambulatory Visit: Payer: Self-pay | Admitting: Family Medicine

## 2019-01-15 ENCOUNTER — Other Ambulatory Visit: Payer: Self-pay | Admitting: Family Medicine

## 2019-01-16 ENCOUNTER — Ambulatory Visit: Payer: Medicare Other | Admitting: Urology

## 2019-02-04 ENCOUNTER — Ambulatory Visit: Payer: Medicare Other | Admitting: Family Medicine

## 2019-02-05 ENCOUNTER — Other Ambulatory Visit: Payer: Self-pay | Admitting: Family Medicine

## 2019-02-05 DIAGNOSIS — E785 Hyperlipidemia, unspecified: Secondary | ICD-10-CM

## 2019-02-06 ENCOUNTER — Other Ambulatory Visit: Payer: Self-pay

## 2019-02-06 ENCOUNTER — Telehealth: Payer: Self-pay

## 2019-02-06 MED ORDER — EMPAGLIFLOZIN 25 MG PO TABS: 25.0000 mg | ORAL_TABLET | Freq: Every day | ORAL | 0 refills | Status: DC

## 2019-02-06 NOTE — Telephone Encounter (Signed)
Add jardiance 25mg  once a day - if we have samples okay to give Continue metformin 1000mg  BID

## 2019-02-06 NOTE — Telephone Encounter (Signed)
Medication Samples have been provided to the patient.  Drug name:  jardiance      Strength: 25 mg        Qty: 3  LOT: [id number]  Exp.Date: 10/2020  Dosing instructions: Take 1 tab daily  The patient has been instructed regarding the correct time, dose, and frequency of taking this medication, including desired effects and most common side effects.   Jennifer Porter 12:51 PM 02/06/2019

## 2019-02-06 NOTE — Telephone Encounter (Signed)
Blood sugar readings  11/09 203 11/10 185 11/11 180 11/12 195 11/13 184 11/14 212 11/15 234 11/16 178   Pt confirmed taking 2 tabs (1000 mg) bid of metformin 500 mg.

## 2019-02-06 NOTE — Telephone Encounter (Signed)
Pt notified. Samples of jardiance up front.

## 2019-02-06 NOTE — Telephone Encounter (Signed)
Pt called to report that her blood sugar is going up. Pt states that glucose today was 220. Pt is currently taking Metformin 500 mg (Take 2 tablets (1,000 mg total) by mouth 2 (two) times daily with a meal). Please advise.

## 2019-02-06 NOTE — Telephone Encounter (Signed)
I need more than 1 reading, please get me readings for the past week, Also confirm she is taking 1000mg  BID, our last visit stated 750mg  BID

## 2019-02-07 ENCOUNTER — Other Ambulatory Visit: Payer: Self-pay

## 2019-02-07 ENCOUNTER — Telehealth: Payer: Self-pay | Admitting: Family Medicine

## 2019-02-07 ENCOUNTER — Encounter (HOSPITAL_COMMUNITY): Payer: Self-pay | Admitting: *Deleted

## 2019-02-07 ENCOUNTER — Emergency Department (HOSPITAL_COMMUNITY)
Admission: EM | Admit: 2019-02-07 | Discharge: 2019-02-07 | Disposition: A | Discharge status: Home / Self Care | Payer: Medicare Other | Attending: Emergency Medicine | Admitting: Emergency Medicine

## 2019-02-07 DIAGNOSIS — J45909 Unspecified asthma, uncomplicated: Secondary | ICD-10-CM | POA: Diagnosis not present

## 2019-02-07 DIAGNOSIS — Z7982 Long term (current) use of aspirin: Secondary | ICD-10-CM | POA: Insufficient documentation

## 2019-02-07 DIAGNOSIS — Z79899 Other long term (current) drug therapy: Secondary | ICD-10-CM | POA: Insufficient documentation

## 2019-02-07 DIAGNOSIS — I10 Essential (primary) hypertension: Secondary | ICD-10-CM | POA: Diagnosis not present

## 2019-02-07 DIAGNOSIS — I2510 Atherosclerotic heart disease of native coronary artery without angina pectoris: Secondary | ICD-10-CM | POA: Insufficient documentation

## 2019-02-07 DIAGNOSIS — T7840XA Allergy, unspecified, initial encounter: Secondary | ICD-10-CM | POA: Diagnosis not present

## 2019-02-07 DIAGNOSIS — R21 Rash and other nonspecific skin eruption: Secondary | ICD-10-CM | POA: Diagnosis present

## 2019-02-07 HISTORY — DX: Acute pancreatitis without necrosis or infection, unspecified: K85.90

## 2019-02-07 LAB — CBG MONITORING, ED: Glucose-Capillary: 167 mg/dL — ABNORMAL HIGH (ref 70–99)

## 2019-02-07 MED ORDER — DIPHENHYDRAMINE HCL 25 MG PO CAPS
25.0000 mg | ORAL_CAPSULE | Freq: Once | ORAL | Status: AC
  Administered 2019-02-07: 17:00:00 25 mg via ORAL
  Filled 2019-02-07: qty 1

## 2019-02-07 MED ORDER — DEXAMETHASONE SODIUM PHOSPHATE 10 MG/ML IJ SOLN
8.0000 mg | Freq: Once | INTRAMUSCULAR | Status: AC
  Administered 2019-02-07: 17:00:00 8 mg via INTRAMUSCULAR
  Filled 2019-02-07: qty 1

## 2019-02-07 MED ORDER — FAMOTIDINE 20 MG PO TABS
20.0000 mg | ORAL_TABLET | Freq: Once | ORAL | Status: AC
  Administered 2019-02-07: 20 mg via ORAL
  Filled 2019-02-07: qty 1

## 2019-02-07 MED ORDER — DEXAMETHASONE SODIUM PHOSPHATE 10 MG/ML IJ SOLN: 8.0000 mg | Freq: Once | INTRAMUSCULAR | Status: DC

## 2019-02-07 NOTE — Telephone Encounter (Signed)
Spoke with patient and she informed me that she was having facial swelling to her cheeks and neck with red itchy rash. She states that she last took a jardiance yesterday around 2:30 after picking up samples from our office. She was fine until this morning when she woke up with itching and swelling that has been getting worse. She did take a Benadryl today at 12 pm but has not noticed any improvements. Advised patient to go to the ER immediately. Patient verbalized understanding.

## 2019-02-07 NOTE — ED Triage Notes (Signed)
Pt started new DM medication yesterday, woke up with redness and swelling to face.  Took one benadryl at 1200 today and PCP's nurse instructed pt to come here for evaluation.  Pt c/o HA.  Pt denies sob or difficultly swallowing.

## 2019-02-07 NOTE — ED Notes (Signed)
Pt left prior to signing dc papers  

## 2019-02-07 NOTE — ED Provider Notes (Addendum)
[facility] EMERGENCY DEPARTMENT Provider Note   CSN: [id number] Arrival date & time: 02/07/19  1404     History   Chief Complaint Chief Complaint  Patient presents with  . Allergic Reaction    HPI Jennifer Porter is a 73 y.o. female.     Chief complaint allergic reaction.  Patient took new diabetes medicine yesterday (Jardiance).  Today she broke out in a pruritic rash on her face, neck, upper chest.  No respiratory distress.  Normal swallowing.  No other obvious etiology for reaction.  She took Benadryl 25 mg earlier today with minimal success.  Severity is moderate.     Past Medical History:  Diagnosis Date  . Allergy    seasonal  . Asthma   . Complication of anesthesia   . Diabetes mellitus without complication ([place])   . GERD (gastroesophageal reflux disease)    hiatal hernia  . H/O: gout   . Hyperlipidemia   . Hypertension   . Pancreatitis   . PONV (postoperative nausea and vomiting)     Patient Active Problem List   Diagnosis Date Noted  . Acute on chronic pancreatitis ([place]) 08/24/2018  . Loose stools 08/24/2018  . Pancreatic calcification 08/22/2018  . Acute pancreatitis without infection or necrosis 08/09/2018  . Sigmoid diverticulosis 08/09/2018  . 3-vessel CAD 08/09/2018  . Aortic atherosclerosis ([place]) 08/09/2018  . Myelolipoma of right adrenal gland 08/09/2018  . Renal cyst, right 08/09/2018  . Hormone replacement therapy 01/12/2017  . Hot flashes due to menopause 01/12/2017  . Hypomagnesemia 06/27/2016  . Diarrhea 03/28/2016  . Vitamin D deficiency 10/03/2014  . Left knee pain 07/02/2014  . Hypertension   . Gastroesophageal reflux disease   . Diabetes mellitus without complication ([place])   . Allergy   . Asthma   . H/O: gout   . Hyperlipidemia     Past Surgical History:  Procedure Laterality Date  . ABDOMINAL HYSTERECTOMY    . APPENDECTOMY    . Arthroscopic Left Knee Left 03/22/2003  . CHOLECYSTECTOMY     in 1990s  .  ESOPHAGOGASTRODUODENOSCOPY (EGD) WITH PROPOFOL N/A 11/08/2018   Procedure: ESOPHAGOGASTRODUODENOSCOPY (EGD) WITH PROPOFOL;  Surgeon: [name], MD;  Location: [facility] ENDOSCOPY;  Service: Endoscopy;  Laterality: N/A;  . EUS N/A 11/08/2018   Procedure: UPPER ENDOSCOPIC ULTRASOUND (EUS) RADIAL;  Surgeon: [name], MD;  Location: [facility] ENDOSCOPY;  Service: Endoscopy;  Laterality: N/A;     OB History   No obstetric history on file.      Home Medications    Prior to Admission medications   Medication Sig Start Date End Date Taking? Authorizing Provider  Choline Fenofibrate (FENOFIBRIC ACID) 135 MG CPDR TAKE 1 CAPSULE BY MOUTH EVERY NIGHT AT BEDTIME Patient taking differently: Take 135 mg by mouth daily.  01/15/19  Yes [place], Modena Nunnery, MD  empagliflozin (JARDIANCE) 25 MG TABS tablet Take 25 mg by mouth daily. 02/06/19  Yes [place], Modena Nunnery, MD  lipase/protease/amylase (CREON) 36000 UNITS CPEP capsule Take two with meals and one with snacks. Up to 8 per day. Patient taking differently: Take 72,000 Units by mouth 3 (three) times daily with meals.  10/19/18  Yes Mahala Menghini, PA-C  lisinopril (ZESTRIL) 20 MG tablet TAKE ONE (1) TABLET BY MOUTH EVERY DAY Patient taking differently: Take 20 mg by mouth daily.  01/01/19  Yes , Modena Nunnery, MD  metFORMIN (GLUCOPHAGE) 500 MG tablet Take 2 tablets (1,000 mg total) by mouth 2 (two) times daily with a  meal. 11/20/18  Yes [place], Modena Nunnery, MD  pantoprazole (PROTONIX) 40 MG tablet Take 1 tablet (40 mg total) by mouth 2 (two) times daily. Patient taking differently: Take 40 mg by mouth daily before breakfast. 1 hour prior to breakfast 07/24/18  Yes Tapia, Kristeen Miss, PA-C  albuterol (PROVENTIL HFA;VENTOLIN HFA) 108 (90 Base) MCG/ACT inhaler Inhale 2 puffs into the lungs every 6 (six) hours as needed for wheezing or shortness of breath. 05/17/16   Alycia Rossetti, MD  aspirin EC 81 MG tablet Take 81 mg by mouth daily.    [provider]   Cholecalciferol (VITAMIN D3) 125 MCG (5000 UT) TABS Take 5,000 Units by mouth daily.    [provider]  fluticasone (FLONASE) 50 MCG/ACT nasal spray Place 2 sprays into both nostrils daily as needed. Patient taking differently: Place 2 sprays into both nostrils daily as needed for allergies.  07/05/18   Delsa Grana, PA-C  gabapentin (NEURONTIN) 300 MG capsule TAKE 1 CAPSULE BY MOUTH THREE TIMES A DAY 01/14/19   [place], Modena Nunnery, MD  magnesium oxide (MAG-OX) 400 MG tablet Take 400 mg by mouth daily.    [provider]  pravastatin (PRAVACHOL) 10 MG tablet TAKE 1 TABLET (10 MG TOTAL) BY MOUTH AT BEDTIME 02/05/19   [place], Modena Nunnery, MD  PREMARIN 0.625 MG tablet TAKE ONE (1) TABLET BY MOUTH EVERY DAY 01/15/19   Alycia Rossetti, MD    Family History Family History  Problem Relation Age of Onset  . Stroke Mother   . Diabetes Mother   . Vision loss Mother        eye removed  . Cancer Mother        cancer in eye - removed  . Diabetes Brother   . Heart disease Father   . Colon cancer Sister   . Uterine cancer Paternal Grandmother   . Uterine cancer Maternal Aunt   . Pancreatic cancer Maternal Aunt     Social History Social History   Tobacco Use  . Smoking status: Never Smoker  . Smokeless tobacco: Never Used  Substance Use Topics  . Alcohol use: No  . Drug use: No     Allergies   Codeine, Macrobid [nitrofurantoin], and Simvastatin   Review of Systems Review of Systems  All other systems reviewed and are negative.    Physical Exam Updated Vital Signs BP (!) 143/66 (BP Location: Right Arm)   Pulse 85   Temp 98.6 F (37 C) (Oral)   Resp 16   Ht 5\' 4"  (1.626 m)   Wt 59.9 kg   SpO2 98%   BMI 22.66 kg/m   Physical Exam Vitals signs and nursing note reviewed.  Constitutional:      Appearance: She is well-developed.  HENT:     Head: Normocephalic and atraumatic.  Eyes:     Conjunctiva/sclera: Conjunctivae normal.  Neck:     Musculoskeletal:  Neck supple.  Cardiovascular:     Rate and Rhythm: Normal rate and regular rhythm.  Pulmonary:     Effort: Pulmonary effort is normal.     Breath sounds: Normal breath sounds.  Abdominal:     General: Bowel sounds are normal.     Palpations: Abdomen is soft.  Musculoskeletal: Normal range of motion.  Skin:    Comments: Face, neck, chest: Slight erythema with facial puffiness.  Neurological:     General: No focal deficit present.     Mental Status: She is alert and oriented to person, place,  and time.  Psychiatric:        Behavior: Behavior normal.      ED Treatments / Results  Labs (all labs ordered are listed, but only abnormal results are displayed) Labs Reviewed  CBG MONITORING, ED - Abnormal; Notable for the following components:      Result Value   Glucose-Capillary 167 (*)    All other components within normal limits    EKG None  Radiology No results found.  Procedures Procedures (including critical care time)  Medications Ordered in ED Medications  diphenhydrAMINE (BENADRYL) capsule 25 mg (25 mg Oral Given 02/07/19 1711)  famotidine (PEPCID) tablet 20 mg (20 mg Oral Given 02/07/19 1711)  dexamethasone (DECADRON) injection 8 mg (8 mg Intramuscular Given 02/07/19 1711)     Initial Impression / Assessment and Plan / ED Course  I have reviewed the triage vital signs and the nursing notes.  Pertinent labs & imaging results that were available during my care of the patient were reviewed by me and considered in my medical decision making (see chart for details).       History and physical consistent with allergic reaction.  IM dexamethasone, oral Benadryl, oral Pepcid.  1745: Recheck.  Feeling much better.  No respiratory distress.  Decreased rash.  Discharge medications Benadryl.   Final Clinical Impressions(s) / ED Diagnoses   Final diagnoses:  Allergic reaction, initial encounter    ED Discharge Orders    None       Nat Christen, MD 02/07/19  1655    Nat Christen, MD 02/07/19 1755

## 2019-02-07 NOTE — Discharge Instructions (Addendum)
Can take Benadryl every 6 hours.  Return if worse.

## 2019-02-07 NOTE — Telephone Encounter (Addendum)
Patient is calling to say that the jardiance that was given to her yesterday has given her an allergic rxn, including face swelling and neck swelling, sent a personal message to christina and also this one in epic  Please advise patient she would like to know what to do Sent to [place] [employer] who actually called the patient

## 2019-02-08 ENCOUNTER — Other Ambulatory Visit: Payer: Self-pay

## 2019-02-08 ENCOUNTER — Other Ambulatory Visit: Payer: Self-pay | Admitting: *Deleted

## 2019-02-08 MED ORDER — LEVEMIR FLEXTOUCH 100 UNIT/ML ~~LOC~~ SOPN: 5.0000 [IU] | PEN_INJECTOR | Freq: Every day | SUBCUTANEOUS | 0 refills | Status: DC

## 2019-02-08 MED ORDER — LEVEMIR FLEXTOUCH 100 UNIT/ML ~~LOC~~ SOPN: 5.0000 [IU] | PEN_INJECTOR | Freq: Every day | SUBCUTANEOUS | 11 refills | Status: DC

## 2019-02-08 MED ORDER — INSULIN GLARGINE 100 UNIT/ML ~~LOC~~ SOLN: 5.0000 [IU] | Freq: Every day | SUBCUTANEOUS | 0 refills | Status: DC

## 2019-02-08 MED ORDER — BD PEN NEEDLE NANO U/F 32G X 4 MM MISC: 1.0000 | Freq: Every day | 1 refills | Status: DC

## 2019-02-08 NOTE — Telephone Encounter (Signed)
Pt seen in ER with reaction to Jardiance I reviewed note  D/C jardiance of course  The steroids given will cause her blood sugars to go up further   With her pancreas issue and this reaction to jardiance  Recommend she start Lantus 5 units at bedtime along with her metformin   Check sugar fasting and before bedtime and record  She should have apt scheduled within the next 2 weeks

## 2019-02-08 NOTE — Telephone Encounter (Signed)
Received call from pharmacy.   Lantus is not covered.   Preferred alternatives include: Basaglar Levemir Tyler Aas.   MD please advise.

## 2019-02-08 NOTE — Addendum Note (Signed)
Addended by: Sheral Flow on: 02/08/2019 01:58 PM   Modules accepted: Orders

## 2019-02-08 NOTE — Telephone Encounter (Signed)
Change to Levemir 5 units at bedtime

## 2019-02-08 NOTE — Telephone Encounter (Signed)
Pt called to report that her blood sugar this morning was over 300. I advised pt of pcp's orders. I sent in her lantus to pharmacy and rescheduled her appt to Nov 30th. Pt verbalizes understanding. Pt will keep a daily log fbs and bring it to her appt.

## 2019-02-12 ENCOUNTER — Telehealth: Payer: Self-pay | Admitting: Family Medicine

## 2019-02-12 NOTE — Telephone Encounter (Signed)
Call placed to patient to inquire.   Reports that her blood sugars are as follows:  AM PM  21-Nov  290  22-Nov 163 250  23-Nov 190 238  24-Nov 174    States that she is taking Metformin 1000mg  PO BID and Levemir 5u SQ Q HS.   Advised that numbers are decreasing. Advised to continue to monitor and continue current medications.   MD to be made aware.

## 2019-02-12 NOTE — Telephone Encounter (Signed)
Patient is calling to say even after insulin, her blood sugar is still out of whack  Please call her and advise [phone]

## 2019-02-12 NOTE — Telephone Encounter (Signed)
Increase to 8 Units of Levemir Send [place] readings in 1 week

## 2019-02-12 NOTE — Telephone Encounter (Signed)
Call placed to patient and patient made aware.  

## 2019-02-18 ENCOUNTER — Other Ambulatory Visit: Payer: Self-pay

## 2019-02-18 ENCOUNTER — Ambulatory Visit (INDEPENDENT_AMBULATORY_CARE_PROVIDER_SITE_OTHER): Payer: Medicare Other | Admitting: Family Medicine

## 2019-02-18 ENCOUNTER — Encounter: Payer: Self-pay | Admitting: Family Medicine

## 2019-02-18 VITALS — BP 122/62 | HR 88 | Temp 97.8°F | Resp 14 | Ht 64.0 in | Wt 132.0 lb

## 2019-02-18 DIAGNOSIS — E119 Type 2 diabetes mellitus without complications: Secondary | ICD-10-CM | POA: Diagnosis not present

## 2019-02-18 DIAGNOSIS — K861 Other chronic pancreatitis: Secondary | ICD-10-CM | POA: Diagnosis not present

## 2019-02-18 DIAGNOSIS — E43 Unspecified severe protein-calorie malnutrition: Secondary | ICD-10-CM

## 2019-02-18 DIAGNOSIS — I10 Essential (primary) hypertension: Secondary | ICD-10-CM | POA: Diagnosis not present

## 2019-02-18 DIAGNOSIS — I700 Atherosclerosis of aorta: Secondary | ICD-10-CM | POA: Diagnosis not present

## 2019-02-18 DIAGNOSIS — E46 Unspecified protein-calorie malnutrition: Secondary | ICD-10-CM | POA: Insufficient documentation

## 2019-02-18 DIAGNOSIS — E785 Hyperlipidemia, unspecified: Secondary | ICD-10-CM

## 2019-02-18 MED ORDER — ONDANSETRON HCL 4 MG PO TABS: 4.0000 mg | ORAL_TABLET | Freq: Three times a day (TID) | ORAL | 0 refills | Status: DC | PRN

## 2019-02-18 NOTE — Assessment & Plan Note (Signed)
Chronic pancreatitis with malnutrition.  We will try to increase her calories as much as possible.  Her abdominal pain has improved.  I given her Zofran for the nausea.

## 2019-02-18 NOTE — Assessment & Plan Note (Signed)
Blood pressures well controlled no changes to medication. 

## 2019-02-18 NOTE — Patient Instructions (Addendum)
Decrease the evening metformin to 500mg  ( 1 tablet) Keep the 1000mg  metformin in the morning  Nausea medication refilled for you  We will call with lab results  F/U 4 months

## 2019-02-18 NOTE — Progress Notes (Signed)
Subjective:    Patient ID: Jennifer Porter, female    DOB: [birth year], 73 y.o.   MRN: [id number]  Patient presents for Follow-up (is fasting)  Patient here to follow-up chronic medical problems.  Diabetes mellitus last A1C was 9.2%,  she was recently started on Levemir  in the setting of her chronic pancreatitis.,  Currently on 8 units bedtime.  I attempted to start her on Jardiance before the insulin however she had a reaction to this.  She did go to the emergency room   She is also on Metformin at 1000 mg twice a day. She does feel a little nauseaous with her dinner metformin.  Her appetite is just not good.  She is lost 20 pounds since this past summer.  In the setting of her chronic pancreatitis she is on enzymes with every meal and snack but still does not eat enough calories. States that she just gets full fast.  She has tried to drink protein supplements but only drinks 1/3-1/2 a can throughout the day. Her bowels have improved does not have diarrhea  CBG readings -fasting this AM  126 , since started insulin 126-190    Evening/ Bedtime  180-290     Hyperlidemia she is on pravastatin 10 mg daily along with fenofibrate  Hypertension she is on lisinopril 20 mg daily  Review Of Systems:  GEN- denies fatigue, fever,+ weight loss,weakness, recent illness HEENT- denies eye drainage, change in vision, nasal discharge, CVS- denies chest pain, palpitations RESP- denies SOB, cough, wheeze ABD- +N/ no V, change in stools, abd pain GU- denies dysuria, hematuria, dribbling, incontinence MSK- denies joint pain, muscle aches, injury Neuro- denies headache, dizziness, syncope, seizure activity       Objective:    BP 122/62   Pulse 88   Temp 97.8 F (36.6 C) (Temporal)   Resp 14   Ht 5\' 4"  (1.626 m)   Wt 132 lb (59.9 kg)   SpO2 98%   BMI 22.66 kg/m  GEN- NAD, alert and oriented x3 HEENT- PERRL, EOMI, non injected sclera, pink conjunctiva, MMM, oropharynx clear Neck- Supple,  no thyromegaly CVS- RRR, no murmur RESP-CTAB ABD-NABS,soft,NT,ND EXT- No edema Pulses- Radial, DP- 2+        Assessment & Plan:      Problem List Items Addressed This Visit      Unprioritized   Aortic atherosclerosis ([place])    SHe is on statin drug no changes.      Chronic pancreatitis ([facility])    Chronic pancreatitis with malnutrition.  We will try to increase her calories as much as possible.  Her abdominal pain has improved.  I given her Zofran for the nausea.       Diabetes mellitus without complication ([place])    Her fasting blood sugars have improved.  She is not tolerating her evening dose of Metformin I think this because she is not taking enough calories and it is making her nauseous.  We will decrease to 500 mg with dinner continue without the milligrams in the morning.  Continue the Levemir at 8 units.  I will recheck her A1c today goal is less than 8% with no hypoglycemia.      Relevant Orders   HM Diabetes Foot Exam (Completed)   Comprehensive metabolic panel   Hemoglobin A1c   Hyperlipidemia   Relevant Orders   Lipid Panel   Hypertension - Primary    Blood pressures well controlled no changes to medication.  Relevant Orders   CBC with Differential   Comprehensive metabolic panel   Protein-calorie malnutrition ([place])    She is not taking in enough calories.  We will try to increase to at least 1 full protein supplement a day.  Eating small meals throughout the day.  She will continue with her pancreatic enzymes per gastroenterology.         Note: This dictation was prepared with Dragon dictation along with smaller phrase technology. Any transcriptional errors that result from this process are unintentional.

## 2019-02-18 NOTE — Assessment & Plan Note (Signed)
Her fasting blood sugars have improved.  She is not tolerating her evening dose of Metformin I think this because she is not taking enough calories and it is making her nauseous.  We will decrease to 500 mg with dinner continue without the milligrams in the morning.  Continue the Levemir at 8 units.  I will recheck her A1c today goal is less than 8% with no hypoglycemia.

## 2019-02-18 NOTE — Assessment & Plan Note (Signed)
She is not taking in enough calories.  We will try to increase to at least 1 full protein supplement a day.  Eating small meals throughout the day.  She will continue with her pancreatic enzymes per gastroenterology.

## 2019-02-18 NOTE — Assessment & Plan Note (Addendum)
SHe is on statin drug no changes.

## 2019-02-19 LAB — CBC WITH DIFFERENTIAL/PLATELET
Absolute Monocytes: 442 cells/uL (ref 200–950)
Basophils Absolute: 78 cells/uL (ref 0–200)
Basophils Relative: 1.2 %
Eosinophils Absolute: 163 cells/uL (ref 15–500)
Eosinophils Relative: 2.5 %
HCT: 37.6 % (ref 35.0–45.0)
Hemoglobin: 12.5 g/dL (ref 11.7–15.5)
Lymphs Abs: 1833 cells/uL (ref 850–3900)
MCH: 30.7 pg (ref 27.0–33.0)
MCHC: 33.2 g/dL (ref 32.0–36.0)
MCV: 92.4 fL (ref 80.0–100.0)
MPV: 11.4 fL (ref 7.5–12.5)
Monocytes Relative: 6.8 %
Neutro Abs: 3985 cells/uL (ref 1500–7800)
Neutrophils Relative %: 61.3 %
Platelets: 283 10*3/uL (ref 140–400)
RBC: 4.07 10*6/uL (ref 3.80–5.10)
RDW: 12 % (ref 11.0–15.0)
Total Lymphocyte: 28.2 %
WBC: 6.5 10*3/uL (ref 3.8–10.8)

## 2019-02-19 LAB — HEMOGLOBIN A1C
Hgb A1c MFr Bld: 8.4 % of total Hgb — ABNORMAL HIGH (ref <5.7)
Mean Plasma Glucose: 194 (calc)
eAG (mmol/L): 10.8 (calc)

## 2019-02-19 LAB — COMPREHENSIVE METABOLIC PANEL
AG Ratio: 1.9 (calc) (ref 1.0–2.5)
ALT: 16 U/L (ref 6–29)
AST: 21 U/L (ref 10–35)
Albumin: 4.4 g/dL (ref 3.6–5.1)
Alkaline phosphatase (APISO): 24 U/L — ABNORMAL LOW (ref 37–153)
BUN: 18 mg/dL (ref 7–25)
CO2: 25 mmol/L (ref 20–32)
Calcium: 10.1 mg/dL (ref 8.6–10.4)
Chloride: 103 mmol/L (ref 98–110)
Creat: 0.73 mg/dL (ref 0.60–0.93)
Globulin: 2.3 g/dL (calc) (ref 1.9–3.7)
Glucose, Bld: 122 mg/dL — ABNORMAL HIGH (ref 65–99)
Potassium: 4.4 mmol/L (ref 3.5–5.3)
Sodium: 139 mmol/L (ref 135–146)
Total Bilirubin: 0.5 mg/dL (ref 0.2–1.2)
Total Protein: 6.7 g/dL (ref 6.1–8.1)

## 2019-02-19 LAB — LIPID PANEL
Cholesterol: 132 mg/dL (ref <200)
HDL: 47 mg/dL — ABNORMAL LOW (ref >=50)
LDL Cholesterol (Calc): 64 mg/dL (calc)
Non-HDL Cholesterol (Calc): 85 mg/dL (calc) (ref <130)
Total CHOL/HDL Ratio: 2.8 (calc) (ref <5.0)
Triglycerides: 130 mg/dL (ref <150)

## 2019-02-26 ENCOUNTER — Ambulatory Visit: Payer: Medicare Other | Admitting: Family Medicine

## 2019-04-01 DIAGNOSIS — Z1231 Encounter for screening mammogram for malignant neoplasm of breast: Secondary | ICD-10-CM | POA: Diagnosis not present

## 2019-04-01 LAB — HM MAMMOGRAPHY

## 2019-04-16 ENCOUNTER — Other Ambulatory Visit: Payer: Self-pay | Admitting: *Deleted

## 2019-04-16 MED ORDER — ESTROGENS CONJUGATED 0.625 MG PO TABS: ORAL_TABLET | ORAL | 0 refills | Status: DC

## 2019-04-18 ENCOUNTER — Encounter: Payer: Self-pay | Admitting: *Deleted

## 2019-04-18 ENCOUNTER — Telehealth: Payer: Self-pay | Admitting: *Deleted

## 2019-04-18 NOTE — Telephone Encounter (Signed)
Your information has been submitted to [employer]. To check for an updated outcome later, reopen this PA request from your dashboard. If [employer] has not responded to your request within 24 hours, contact [employer] at [phone]. If you think there may be a problem with your PA request, use our live chat feature at the bottom right.    

## 2019-04-18 NOTE — Telephone Encounter (Signed)
Received request from pharmacy for PA on Premarin tabs.   PA submitted.   Dx: N95.1- menopause, Z79.890- HRT

## 2019-04-22 MED ORDER — ESTROGENS CONJUGATED 0.625 MG PO TABS: ORAL_TABLET | ORAL | 3 refills | Status: DC

## 2019-04-22 NOTE — Progress Notes (Signed)
Primary Care Physician: Alycia Rossetti, MD  Primary Gastroenterologist:  Garfield Cornea, MD  Chief Complaint  Patient presents with  . Follow-up    Pancreatitis,stomach pain,acid reflux    HPI: Jennifer Porter is a 74 y.o. female here for follow-up.  She was last seen in October 2020.  She has a history of chronic pancreatitis, based on findings on EUS/MRCP 2020.  Evidence of exocrine/endocrine deficiency.  She had significant improvement of abdominal pain with use of pancreatic enzymes.  Declined additional screening colonoscopy.  Weight has been stable since November.  However overall she is down about 30 pounds in the past year. Occasional breakthrough heartburn. Will take second "acid pill". Otherwise will take pantoprazole once daily. Eats three meals a day. No snacks. Started insulin last year, for few months. Mild achy/cramping type abdominal pain.  Much better than it was last year.  Pancreatic enzymes seems to help. BM doing better. No longer with diarrhea, she had had for years. Stools or more normal, 1-2 times per day. No melena, brbpr.   Wt Readings from Last 3 Encounters:  04/23/19 130 lb 3.2 oz (59.1 kg)  02/18/19 132 lb (59.9 kg)  02/07/19 132 lb (59.9 kg)     Current Outpatient Medications  Medication Sig Dispense Refill  . albuterol (PROVENTIL HFA;VENTOLIN HFA) 108 (90 Base) MCG/ACT inhaler Inhale 2 puffs into the lungs every 6 (six) hours as needed for wheezing or shortness of breath. (Patient taking differently: Inhale 2 puffs into the lungs as needed for wheezing or shortness of breath. ) 1 Inhaler 0  . aspirin EC 81 MG tablet Take 81 mg by mouth daily.    . Cholecalciferol (VITAMIN D3) 125 MCG (5000 UT) TABS Take 5,000 Units by mouth daily.    . Choline Fenofibrate (FENOFIBRIC ACID) 135 MG CPDR TAKE 1 CAPSULE BY MOUTH EVERY NIGHT AT BEDTIME (Patient taking differently: Take 135 mg by mouth daily. ) 90 capsule 0  . fluticasone (FLONASE) 50 MCG/ACT nasal  spray Place 2 sprays into both nostrils daily as needed. (Patient taking differently: Place 2 sprays into both nostrils daily as needed for allergies. ) 16 g 5  . gabapentin (NEURONTIN) 300 MG capsule TAKE 1 CAPSULE BY MOUTH THREE TIMES A DAY 90 capsule 1  . Insulin Detemir (LEVEMIR FLEXTOUCH) 100 UNIT/ML Pen Inject 5-50 Units into the skin at bedtime. As directed by your provider. (Patient taking differently: Inject 5-50 Units into the skin at bedtime. 8U As directed by your provider.) 15 mL 0  . Insulin Pen Needle (BD PEN NEEDLE NANO U/F) 32G X 4 MM MISC 1 each by Does not apply route daily. 100 each 1  . lipase/protease/amylase (CREON) 36000 UNITS CPEP capsule Take two with meals and one with snacks. Up to 8 per day. (Patient taking differently: Take 72,000 Units by mouth 3 (three) times daily with meals. ) 240 capsule 5  . lisinopril (ZESTRIL) 20 MG tablet TAKE ONE (1) TABLET BY MOUTH EVERY DAY (Patient taking differently: Take 20 mg by mouth daily. ) 90 tablet 1  . magnesium oxide (MAG-OX) 400 MG tablet Take 400 mg by mouth daily.    . metFORMIN (GLUCOPHAGE) 500 MG tablet Take 2 tablets (1,000 mg total) by mouth 2 (two) times daily with a meal. (Patient taking differently: Take 1,000 mg by mouth 2 (two) times daily with a meal. Takes 1000 mg in the morning and 500 mg at dinner time.) 120 tablet 3  . ondansetron (ZOFRAN)  4 MG tablet Take 1 tablet (4 mg total) by mouth every 8 (eight) hours as needed for nausea or vomiting. (Patient taking differently: Take 4 mg by mouth as needed for nausea or vomiting. ) 20 tablet 0  . pantoprazole (PROTONIX) 40 MG tablet Take 1 tablet (40 mg total) by mouth 2 (two) times daily. (Patient taking differently: Take 40 mg by mouth daily before breakfast. 1 hour prior to breakfast PRN) 60 tablet 2  . pravastatin (PRAVACHOL) 10 MG tablet TAKE 1 TABLET (10 MG TOTAL) BY MOUTH AT BEDTIME 90 tablet 0   No current facility-administered medications for this visit.     Allergies as of 04/23/2019 - Review Complete 04/23/2019  Allergen Reaction Noted  . Codeine Nausea And Vomiting 11/28/2012  . Jardiance [empagliflozin]  02/08/2019  . Macrobid [nitrofurantoin] Hives 11/28/2012  . Simvastatin  10/26/2017    ROS:  General: Negative for anorexia, weight loss, fever, chills, fatigue, weakness. ENT: Negative for hoarseness, difficulty swallowing , nasal congestion. CV: Negative for chest pain, angina, palpitations, dyspnea on exertion, peripheral edema.  Respiratory: Negative for dyspnea at rest, dyspnea on exertion, cough, sputum, wheezing.  GI: See history of present illness. GU:  Negative for dysuria, hematuria, urinary incontinence, urinary frequency, nocturnal urination.  Endo: Negative for unusual weight change.    Physical Examination:   BP (!) 154/70   Pulse 87   Temp (!) 97.1 F (36.2 C) (Temporal)   Ht 5' 4.5" (1.638 m)   Wt 130 lb 3.2 oz (59.1 kg)   BMI 22.00 kg/m   General: Well-nourished, well-developed in no acute distress.  Eyes: No icterus. Mouth: Oropharyngeal mucosa moist and pink , no lesions erythema or exudate. Lungs: Clear to auscultation bilaterally.  Heart: Regular rate and rhythm, no murmurs rubs or gallops.  Abdomen: Bowel sounds are normal, mild diffuse tenderness, nondistended, no hepatosplenomegaly or masses, no abdominal bruits or hernia , no rebound or guarding.   Extremities: No lower extremity edema. No clubbing or deformities. Neuro: Alert and oriented x 4   Skin: Warm and dry, no jaundice.   Psych: Alert and cooperative, normal mood and affect.  Labs:  Lab Results  Component Value Date   CREATININE 0.73 02/18/2019   BUN 18 02/18/2019   NA 139 02/18/2019   K 4.4 02/18/2019   CL 103 02/18/2019   CO2 25 02/18/2019   Lab Results  Component Value Date   HGBA1C 8.4 (H) 02/18/2019   Lab Results  Component Value Date   WBC 6.5 02/18/2019   HGB 12.5 02/18/2019   HCT 37.6 02/18/2019   MCV 92.4  02/18/2019   PLT 283 02/18/2019   Lab Results  Component Value Date   ALT 16 02/18/2019   AST 21 02/18/2019   ALKPHOS 17 (L) 08/24/2018   BILITOT 0.5 02/18/2019     Imaging Studies: No results found.

## 2019-04-22 NOTE — Telephone Encounter (Signed)
Received PA determination.   PA approved 04/18/2019- 04/17/2020.  Pharmacy made aware.

## 2019-04-23 ENCOUNTER — Other Ambulatory Visit: Payer: Self-pay

## 2019-04-23 ENCOUNTER — Encounter: Payer: Self-pay | Admitting: Gastroenterology

## 2019-04-23 ENCOUNTER — Ambulatory Visit (INDEPENDENT_AMBULATORY_CARE_PROVIDER_SITE_OTHER): Payer: Medicare Other | Admitting: Gastroenterology

## 2019-04-23 VITALS — BP 154/70 | HR 87 | Temp 97.1°F | Ht 64.5 in | Wt 130.2 lb

## 2019-04-23 DIAGNOSIS — K219 Gastro-esophageal reflux disease without esophagitis: Secondary | ICD-10-CM | POA: Diagnosis not present

## 2019-04-23 DIAGNOSIS — K861 Other chronic pancreatitis: Secondary | ICD-10-CM | POA: Diagnosis not present

## 2019-04-23 MED ORDER — PANTOPRAZOLE SODIUM 40 MG PO TBEC: DELAYED_RELEASE_TABLET | ORAL | 3 refills | Status: DC

## 2019-04-23 MED ORDER — ESTRADIOL 1 MG PO TABS: 1.0000 mg | ORAL_TABLET | Freq: Every day | ORAL | 3 refills | Status: DC

## 2019-04-23 NOTE — Progress Notes (Signed)
CC'ED TO PCP 

## 2019-04-23 NOTE — Telephone Encounter (Signed)
Changed to estradiol, different estrogen form but cheaper alternative Dose is 1mg  once a day

## 2019-04-23 NOTE — Telephone Encounter (Signed)
Received call from patient.    Reports that even with PA, Premarin tabs remain unaffordable. Co-pay >$100/ 30 days.   MD please advise.

## 2019-04-23 NOTE — Patient Instructions (Signed)
1. For the next several weeks, take pantoprazole 40 mg before breakfast and 40 mg for evening meal consistently.  See if this helps any of your abdominal pain and reflux.  After a few weeks, you can go back to 1 every morning before breakfast and taking an additional 1 prior to evening meal as needed. 2. Continue Creon to with meals and 1 with snacks. 3. Try to incorporate 1-2 snacks daily to maintain your body weight. 4. Return to the office in 4 months or call sooner if needed.

## 2019-04-23 NOTE — Assessment & Plan Note (Signed)
Chronic pancreatitis noted on EUS/MRCP last year.  Abdominal pain much improved with pancreatic enzymes.  Still has some vague achiness/cramping.  Also with some breakthrough heartburn.  Her weight has stabilized but she admits to eating 3 meals a day only.  No snacks.  Encouraged her to incorporate 1-2 snacks daily.  Take Creon 1 with each snack.  She will increase pantoprazole to 40 mg twice daily before meals for the next few weeks to see if this helps with any of her abdominal pain and for her breakthrough heartburn.  If she needs to continue twice daily she cannot otherwise if she is feeling better or notes no improvement on twice daily dosing, in several weeks she can go back to 1-2 times daily as needed.  Return to the office in 4 months or call sooner if needed.

## 2019-04-23 NOTE — Addendum Note (Signed)
Addended by: Vic Blackbird F on: 04/23/2019 12:50 PM   Modules accepted: Orders

## 2019-04-23 NOTE — Telephone Encounter (Signed)
No answer, no VM

## 2019-04-24 ENCOUNTER — Other Ambulatory Visit: Payer: Self-pay | Admitting: Family Medicine

## 2019-04-25 NOTE — Telephone Encounter (Signed)
Call placed to patient and patient made aware.  

## 2019-05-07 ENCOUNTER — Other Ambulatory Visit: Payer: Self-pay | Admitting: Family Medicine

## 2019-05-07 DIAGNOSIS — E785 Hyperlipidemia, unspecified: Secondary | ICD-10-CM

## 2019-05-08 ENCOUNTER — Telehealth: Payer: Self-pay | Admitting: *Deleted

## 2019-05-08 NOTE — Telephone Encounter (Signed)
Call placed to patient and patient spouse made aware.  

## 2019-05-08 NOTE — Telephone Encounter (Signed)
She can take it once a day with food temporarily , but I dont recommend staying on it  She can also use the back patches over the counter - lidocaine or salonpas Or topicals

## 2019-05-08 NOTE — Telephone Encounter (Signed)
Received call from patient.   Reports that she received the COVID vaccine 2 weeks prior. States that since then she has been having increased pain in her back and knee.   Inquired as to if she can take her Diclofenac for her pain.   MD please advise.

## 2019-05-16 ENCOUNTER — Other Ambulatory Visit: Payer: Self-pay | Admitting: Family Medicine

## 2019-05-16 DIAGNOSIS — E119 Type 2 diabetes mellitus without complications: Secondary | ICD-10-CM

## 2019-06-03 DIAGNOSIS — M1712 Unilateral primary osteoarthritis, left knee: Secondary | ICD-10-CM | POA: Diagnosis not present

## 2019-06-17 ENCOUNTER — Other Ambulatory Visit: Payer: Self-pay | Admitting: Gastroenterology

## 2019-06-19 ENCOUNTER — Telehealth: Payer: Self-pay | Admitting: Internal Medicine

## 2019-06-19 NOTE — Telephone Encounter (Signed)
Refill request for Creon 36000, 2 pills with meals and 1 pill with snacks.

## 2019-06-19 NOTE — Telephone Encounter (Signed)
Jennifer Porter from [facility] called to make sure we received patient's prescription. He is having problems with the faxes going through. He said he sent it on 3/29

## 2019-06-25 ENCOUNTER — Other Ambulatory Visit: Payer: Self-pay

## 2019-06-25 ENCOUNTER — Other Ambulatory Visit: Payer: Medicare Other

## 2019-06-25 DIAGNOSIS — E785 Hyperlipidemia, unspecified: Secondary | ICD-10-CM

## 2019-06-25 DIAGNOSIS — E119 Type 2 diabetes mellitus without complications: Secondary | ICD-10-CM | POA: Diagnosis not present

## 2019-06-25 DIAGNOSIS — I10 Essential (primary) hypertension: Secondary | ICD-10-CM | POA: Diagnosis not present

## 2019-06-26 ENCOUNTER — Ambulatory Visit (INDEPENDENT_AMBULATORY_CARE_PROVIDER_SITE_OTHER): Payer: Medicare Other | Admitting: Family Medicine

## 2019-06-26 ENCOUNTER — Encounter: Payer: Self-pay | Admitting: Family Medicine

## 2019-06-26 VITALS — BP 130/68 | HR 78 | Temp 97.9°F | Resp 14 | Ht 64.5 in | Wt 126.0 lb

## 2019-06-26 DIAGNOSIS — E43 Unspecified severe protein-calorie malnutrition: Secondary | ICD-10-CM

## 2019-06-26 DIAGNOSIS — I10 Essential (primary) hypertension: Secondary | ICD-10-CM

## 2019-06-26 DIAGNOSIS — E119 Type 2 diabetes mellitus without complications: Secondary | ICD-10-CM | POA: Diagnosis not present

## 2019-06-26 DIAGNOSIS — I700 Atherosclerosis of aorta: Secondary | ICD-10-CM

## 2019-06-26 DIAGNOSIS — Z79890 Hormone replacement therapy (postmenopausal): Secondary | ICD-10-CM

## 2019-06-26 DIAGNOSIS — E785 Hyperlipidemia, unspecified: Secondary | ICD-10-CM | POA: Diagnosis not present

## 2019-06-26 DIAGNOSIS — K861 Other chronic pancreatitis: Secondary | ICD-10-CM

## 2019-06-26 LAB — CBC WITH DIFFERENTIAL/PLATELET
Absolute Monocytes: 468 cells/uL (ref 200–950)
Basophils Absolute: 72 cells/uL (ref 0–200)
Basophils Relative: 1.2 %
Eosinophils Absolute: 198 cells/uL (ref 15–500)
Eosinophils Relative: 3.3 %
HCT: 40.2 % (ref 35.0–45.0)
Hemoglobin: 13.2 g/dL (ref 11.7–15.5)
Lymphs Abs: 2082 cells/uL (ref 850–3900)
MCH: 30.3 pg (ref 27.0–33.0)
MCHC: 32.8 g/dL (ref 32.0–36.0)
MCV: 92.4 fL (ref 80.0–100.0)
MPV: 11.2 fL (ref 7.5–12.5)
Monocytes Relative: 7.8 %
Neutro Abs: 3180 cells/uL (ref 1500–7800)
Neutrophils Relative %: 53 %
Platelets: 248 10*3/uL (ref 140–400)
RBC: 4.35 10*6/uL (ref 3.80–5.10)
RDW: 12 % (ref 11.0–15.0)
Total Lymphocyte: 34.7 %
WBC: 6 10*3/uL (ref 3.8–10.8)

## 2019-06-26 LAB — COMPREHENSIVE METABOLIC PANEL
AG Ratio: 2.3 (calc) (ref 1.0–2.5)
ALT: 20 U/L (ref 6–29)
AST: 22 U/L (ref 10–35)
Albumin: 4.5 g/dL (ref 3.6–5.1)
Alkaline phosphatase (APISO): 33 U/L — ABNORMAL LOW (ref 37–153)
BUN: 25 mg/dL (ref 7–25)
CO2: 30 mmol/L (ref 20–32)
Calcium: 9.8 mg/dL (ref 8.6–10.4)
Chloride: 104 mmol/L (ref 98–110)
Creat: 0.71 mg/dL (ref 0.60–0.93)
Globulin: 2 g/dL (calc) (ref 1.9–3.7)
Glucose, Bld: 82 mg/dL (ref 65–99)
Potassium: 4.5 mmol/L (ref 3.5–5.3)
Sodium: 143 mmol/L (ref 135–146)
Total Bilirubin: 0.5 mg/dL (ref 0.2–1.2)
Total Protein: 6.5 g/dL (ref 6.1–8.1)

## 2019-06-26 LAB — LIPID PANEL
Cholesterol: 139 mg/dL (ref <200)
HDL: 56 mg/dL (ref >=50)
LDL Cholesterol (Calc): 69 mg/dL (calc)
Non-HDL Cholesterol (Calc): 83 mg/dL (calc) (ref <130)
Total CHOL/HDL Ratio: 2.5 (calc) (ref <5.0)
Triglycerides: 56 mg/dL (ref <150)

## 2019-06-26 LAB — HEMOGLOBIN A1C
Hgb A1c MFr Bld: 6.7 % of total Hgb — ABNORMAL HIGH (ref <5.7)
Mean Plasma Glucose: 146 (calc)
eAG (mmol/L): 8.1 (calc)

## 2019-06-26 MED ORDER — LEVEMIR FLEXTOUCH 100 UNIT/ML ~~LOC~~ SOPN: 5.0000 [IU] | PEN_INJECTOR | Freq: Every day | SUBCUTANEOUS | 0 refills | Status: DC

## 2019-06-26 NOTE — Progress Notes (Signed)
Subjective:    Patient ID: Jennifer Porter, female    DOB: [birth year], 74 y.o.   MRN: [id number]  Patient presents for Follow-up (has had labs)  Here to follow-up chronic medical problems.  Medications reviewed.  Diabetes mellitus in the setting of chronic pancreatitis.  She had recent fasting labs which were all reviewed at the bedside. She is on Metformin at 1000 mg in the morning and 500 mg at bedtime along with Levemir 8 units CBGs fasting range 80-120  , evening 150-230 bedtime, she does feel shaky when CBG in the 80's, past few weeks have been 80-90's fasting  No hypoglycemia symptoms A1c returned at 6.7% which is much improved from 8.2% in our last check Due for eye exam due for urine micro  Chronic pancreatitis still following with GI, has good and bad days. Weight down 4lbs since Feb. When she has flares she will just eat broth and jello for a few days until abd pain improves  She does take protein supplement 30gram with low sugar , also eats chicken, not much red meat  Eats fruits /veggies She has cut out a lot of sugars  She is taking her creon with meals  Most stools are soft   Hypertension she is tolerating lisinopril 20 mg without any difficulty  Hyperlipidemia she is on pravastatin 10 mg and fenofibric acid 135 mg once a day Cholesterol is normal including triglycerides with normal liver function test   She has had both COVID-19 vaccines  She did have a fall, her left knee gave out and she fell. She was seen by Dr. Lyla Glassing and given steroid injection in about 2-3 weeks ago    Estrogen therapy for post menopausal symptoms, mammogram normal In Jan , still has some mild hot flashes but improved on the medication   Review Of Systems:  GEN- denies fatigue, fever, weight loss,weakness, recent illness HEENT- denies eye drainage, change in vision, nasal discharge, CVS- denies chest pain, palpitations RESP- denies SOB, cough, wheeze ABD- denies N/V, change in stools,  abd pain GU- denies dysuria, hematuria, dribbling, incontinence MSK- denies joint pain, muscle aches, injury Neuro- denies headache, dizziness, syncope, seizure activity       Objective:    BP 130/68   Pulse 78   Temp 97.9 F (36.6 C) (Temporal)   Resp 14   Ht 5' 4.5" (1.638 m)   Wt 126 lb (57.2 kg)   SpO2 99%   BMI 21.29 kg/m  GEN- NAD, alert and oriented x3 HEENT- PERRL, EOMI, non injected sclera, pink conjunctiva,  Neck- Supple, no thyromegaly CVS- RRR, no murmur RESP-CTAB ABD-NABS,soft,mild bloating, no fluid wave EXT- No edema Pulses- Radial, DP- 2+        Assessment & Plan:      Problem List Items Addressed This Visit      Unprioritized   Aortic atherosclerosis (HCC)    Lipids at goal      Chronic pancreatitis (HCC)    Chronic pancreatitis on creon Advised to eat 1 snack at day at minimum to help with weight F/u GI      Diabetes mellitus without complication (HCC)    Hypoglycemia symptoms in the morning Decrease levemir to 6 units Continue metformon 1000mg  AM, 500mg  PM      Relevant Medications   insulin detemir (LEVEMIR FLEXTOUCH) 100 UNIT/ML FlexPen   Hormone replacement therapy    Mammogram normal Benefiting from low dose hormone      Hyperlipidemia   Hypertension -  Primary    Controlled no changes       Protein-calorie malnutrition ([place])      Note: This dictation was prepared with Dragon dictation along with smaller phrase technology. Any transcriptional errors that result from this process are unintentional.

## 2019-06-26 NOTE — Assessment & Plan Note (Signed)
Lipids at goal

## 2019-06-26 NOTE — Assessment & Plan Note (Signed)
Chronic pancreatitis on creon Advised to eat 1 snack at day at minimum to help with weight F/u GI

## 2019-06-26 NOTE — Patient Instructions (Addendum)
Decrease levemir to 6 units Continue metformin  Schedule an eye exam  F/U 4 months for Physical

## 2019-06-26 NOTE — Assessment & Plan Note (Signed)
Hypoglycemia symptoms in the morning Decrease levemir to 6 units Continue metformon 1000mg  AM, 500mg  PM

## 2019-06-26 NOTE — Assessment & Plan Note (Signed)
Mammogram normal Benefiting from low dose hormone

## 2019-06-26 NOTE — Assessment & Plan Note (Signed)
Controlled no changes 

## 2019-07-08 ENCOUNTER — Other Ambulatory Visit: Payer: Self-pay | Admitting: Family Medicine

## 2019-07-16 ENCOUNTER — Other Ambulatory Visit: Payer: Self-pay | Admitting: Family Medicine

## 2019-07-29 ENCOUNTER — Other Ambulatory Visit: Payer: Self-pay | Admitting: Family Medicine

## 2019-07-29 DIAGNOSIS — E785 Hyperlipidemia, unspecified: Secondary | ICD-10-CM

## 2019-08-20 ENCOUNTER — Other Ambulatory Visit: Payer: Self-pay | Admitting: Family Medicine

## 2019-08-21 ENCOUNTER — Ambulatory Visit (INDEPENDENT_AMBULATORY_CARE_PROVIDER_SITE_OTHER): Payer: Medicare Other | Admitting: Gastroenterology

## 2019-08-21 ENCOUNTER — Other Ambulatory Visit: Payer: Self-pay

## 2019-08-21 ENCOUNTER — Encounter: Payer: Self-pay | Admitting: Gastroenterology

## 2019-08-21 VITALS — BP 131/64 | HR 82 | Temp 96.8°F | Ht 65.0 in | Wt 127.2 lb

## 2019-08-21 DIAGNOSIS — K861 Other chronic pancreatitis: Secondary | ICD-10-CM | POA: Diagnosis not present

## 2019-08-21 DIAGNOSIS — K219 Gastro-esophageal reflux disease without esophagitis: Secondary | ICD-10-CM

## 2019-08-21 DIAGNOSIS — R109 Unspecified abdominal pain: Secondary | ICD-10-CM | POA: Insufficient documentation

## 2019-08-21 MED ORDER — DICYCLOMINE HCL 10 MG PO CAPS: ORAL_CAPSULE | ORAL | 3 refills | Status: DC

## 2019-08-21 NOTE — Assessment & Plan Note (Signed)
Chronic pancreatitis noted on EUS/MRCP last year.  She continues to report doing better overall.  She feels like she is about 99% improved.  She continues to have some abdominal cramping and occasional loose stool (about twice per week).  She is taking her Creon as prescribed.  She wonders if she has an element of IBS.  She inquires about trying probiotics.  Encouraged her to try probiotics daily for 4 weeks.  If no improvement she should try a different live culture strain for 4 weeks before calling it quits on probiotics.  I have provided her with dicyclomine to take 10 mg up to twice daily for abdominal cramping and loose stool as needed.  Return to the office in 4 months or call sooner if symptoms have not improved.  She has some vague lower abdominal discomfort and urinary retention.  We discussed if symptoms worsen she should consider repeat CT scan, could also see her urologist or PCP for urinary retention.  Patient voiced understanding.

## 2019-08-21 NOTE — Progress Notes (Signed)
Primary Care Physician: Alycia Rossetti, MD  Primary Gastroenterologist:  Garfield Cornea, MD   Chief Complaint  Patient presents with   Abdominal Pain    spasms/cramps    HPI: Jennifer Porter is a 74 y.o. female here for follow-up.  Last seen in February.  She has a history of chronic pancreatitis based on EUS/MRCP in 2020.  Evidence of exocrine/endocrine deficiency.  She had significant improvement of abdominal pain with use of pancreatic enzymes.  Previously declined additional screening colonoscopies.  When she was last seen she was having some breakthrough symptoms of heartburn, vague achiness/cramping in the stomach.  We increased her pantoprazole to 40 mg twice daily before meals.  Since the beginning of her illness last year, she states she is 99% improved. Still having abdominal cramping most days but nothing like it was in the past. Starts in the upper abdomen and then radiates to lower abdomen. Sometimes associated with diarrhea or a bowel movement.  Does have solid or soft stools most of the time but periods of diarrhea can occur.  Only occasionally has "bad" diarrhea.  Usually just a couple of loose stools per week.  No melena or rectal bleeding.  Continues to watch her diet.  Avoids greasy and fatty foods.  Trying to keep her weight in the 120 range per PCP request.  Prior to onset of illness last year she was in the 160 pound range.  Feels acid reflux is better on pantoprazole twice a day.  Has had some urinary hesitancy/retention associated with increased frequency.  Chronically has left lower pelvic pain which is going on for quite some time.  She notes that CT last year did not provide an explanation for this pain.  Patient considering trying probiotics to see if helps with digestive issues.  08/2018: 156.4 lb 04/2019: 130 lb 06/2019: 126 lb 08/2019: 127.2 lb  Colonoscopy over 12 years ago. Really doesn't want another unless life is depending on it. The prep was horrible.         Current Outpatient Medications  Medication Sig Dispense Refill   albuterol (PROVENTIL HFA;VENTOLIN HFA) 108 (90 Base) MCG/ACT inhaler Inhale 2 puffs into the lungs every 6 (six) hours as needed for wheezing or shortness of breath. (Patient taking differently: Inhale 2 puffs into the lungs as needed for wheezing or shortness of breath. ) 1 Inhaler 0   aspirin EC 81 MG tablet Take 81 mg by mouth daily.     Cholecalciferol (VITAMIN D3) 125 MCG (5000 UT) TABS Take 5,000 Units by mouth daily.     Choline Fenofibrate (FENOFIBRIC ACID) 135 MG CPDR TAKE 1 CAPSULE BY MOUTH EVERY NIGHT AT BEDTIME 90 capsule 0   CREON 36000 units CPEP capsule TAKE TWO CAPSULES BY MOUTH WITH MEALS AND ONE WITH SNACKS (UP TO 8 A DAY) 240 capsule 5   Diclofenac Sodium CR 100 MG 24 hr tablet Take 100 mg by mouth as needed.      estradiol (ESTRACE) 1 MG tablet TAKE ONE TABLET (1MG  TOTAL) BY MOUTH DAILY 30 tablet 3   fluticasone (FLONASE) 50 MCG/ACT nasal spray Place 2 sprays into both nostrils daily as needed. (Patient taking differently: Place 2 sprays into both nostrils daily as needed for allergies. ) 16 g 5   gabapentin (NEURONTIN) 300 MG capsule TAKE 1 CAPSULE BY MOUTH THREE TIMES A DAY (Patient taking differently: Take 300 mg by mouth daily. ) 90 capsule 1   insulin detemir (LEVEMIR FLEXTOUCH) 100  UNIT/ML FlexPen Inject 5-50 Units into the skin at bedtime. As directed by your provider. (Patient taking differently: Inject 6 Units into the skin at bedtime. As directed by your provider.) 15 mL 0   Insulin Pen Needle (BD PEN NEEDLE NANO U/F) 32G X 4 MM MISC 1 each by Does not apply route daily. 100 each 1   lisinopril (ZESTRIL) 20 MG tablet TAKE ONE (1) TABLET BY MOUTH EVERY DAY (Patient taking differently: Take 20 mg by mouth daily. ) 90 tablet 1   magnesium oxide (MAG-OX) 400 MG tablet Take 400 mg by mouth daily.     metFORMIN (GLUCOPHAGE) 500 MG tablet TAKE TWO (2) TABLETS BY MOUTH 2 TIMES DAILY WITH  A MEAL (Patient taking differently: 500-1,000 mg. Takes 2 tablets in mornings and 1 tablet in evening) 120 tablet 3   pantoprazole (PROTONIX) 40 MG tablet Take one tablet once or twice daily, 30 minutes before a meal. 180 tablet 3   pravastatin (PRAVACHOL) 10 MG tablet TAKE 1 TABLET (10 MG TOTAL) BY MOUTH AT BEDTIME 90 tablet 0   No current facility-administered medications for this visit.    Allergies as of 08/21/2019 - Review Complete 08/21/2019  Allergen Reaction Noted   Codeine Nausea And Vomiting 11/28/2012   Jardiance [empagliflozin]  02/08/2019   Macrobid [nitrofurantoin] Hives 11/28/2012   Simvastatin  10/26/2017    ROS:  General: Negative for anorexia, weight loss, fever, chills, fatigue, weakness.  See HPI ENT: Negative for hoarseness, difficulty swallowing , nasal congestion. CV: Negative for chest pain, angina, palpitations, dyspnea on exertion, peripheral edema.  Respiratory: Negative for dyspnea at rest, dyspnea on exertion, cough, sputum, wheezing.  GI: See history of present illness. GU:  Negative for dysuria, hematuria, urinary incontinence, urinary frequency, nocturnal urination.  Endo: Negative for unusual weight change.    Physical Examination:   BP 131/64    Pulse 82    Temp (!) 96.8 F (36 C) (Temporal)    Ht 5\' 5"  (1.651 m)    Wt 127 lb 3.2 oz (57.7 kg)    BMI 21.17 kg/m   General: Well-nourished, well-developed in no acute distress.  Eyes: No icterus. Mouth: masked Lungs: Clear to auscultation bilaterally.  Heart: Regular rate and rhythm, no murmurs rubs or gallops.  Abdomen: Bowel sounds are normal,  nondistended, no hepatosplenomegaly or masses, no abdominal bruits or hernia , no rebound or guarding.  Mild suprapubic discomfort.  Extremities: No lower extremity edema. No clubbing or deformities. Neuro: Alert and oriented x 4   Skin: Warm and dry, no jaundice.   Psych: Alert and cooperative, normal mood and affect.  Labs:  Lab Results    Component Value Date   CREATININE 0.71 06/25/2019   BUN 25 06/25/2019   NA 143 06/25/2019   K 4.5 06/25/2019   CL 104 06/25/2019   CO2 30 06/25/2019   Lab Results  Component Value Date   ALT 20 06/25/2019   AST 22 06/25/2019   ALKPHOS 17 (L) 08/24/2018   BILITOT 0.5 06/25/2019   Lab Results  Component Value Date   WBC 6.0 06/25/2019   HGB 13.2 06/25/2019   HCT 40.2 06/25/2019   MCV 92.4 06/25/2019   PLT 248 06/25/2019    Lab Results  Component Value Date   HGBA1C 6.7 (H) 06/25/2019    Imaging Studies: No results found.

## 2019-08-21 NOTE — Assessment & Plan Note (Signed)
Doing better on pantoprazole twice per day. She can continue once to twice daily as needed. Return to the office in four months.

## 2019-08-21 NOTE — Progress Notes (Signed)
Cc'ed to pcp °

## 2019-08-21 NOTE — Patient Instructions (Signed)
1. Continue Creon two with meals and one with snacks.  2. Continue pantoprazole once to twice daily before meals for reflux. 3. Add a probiotic daily for 4 weeks.  If no noted benefit, then try a different probiotic with different live bacteria for 4 weeks.  If no improvement after that, then you can stop probiotics. 4. Prescription for dicyclomine sent to your pharmacy.  You can take up to twice daily for abdominal cramping and loose stools.  Use only as needed. 5. Monitor lower abdominal discomfort.  If you feel like symptoms are worsening, please let us know.  Would consider repeat CT scan.  Also if you feel like your bladder is not emptying well, consider discussing with your PCP or neurologist. 6. Return to the office in four months.

## 2019-09-30 DIAGNOSIS — M1712 Unilateral primary osteoarthritis, left knee: Secondary | ICD-10-CM | POA: Diagnosis not present

## 2019-10-15 ENCOUNTER — Other Ambulatory Visit: Payer: Self-pay | Admitting: Family Medicine

## 2019-10-15 DIAGNOSIS — E119 Type 2 diabetes mellitus without complications: Secondary | ICD-10-CM

## 2019-10-22 ENCOUNTER — Other Ambulatory Visit: Payer: Self-pay | Admitting: Family Medicine

## 2019-10-24 ENCOUNTER — Other Ambulatory Visit: Payer: Self-pay

## 2019-10-24 ENCOUNTER — Other Ambulatory Visit: Payer: Medicare Other

## 2019-10-24 DIAGNOSIS — E785 Hyperlipidemia, unspecified: Secondary | ICD-10-CM

## 2019-10-24 DIAGNOSIS — I10 Essential (primary) hypertension: Secondary | ICD-10-CM | POA: Diagnosis not present

## 2019-10-24 DIAGNOSIS — E119 Type 2 diabetes mellitus without complications: Secondary | ICD-10-CM | POA: Diagnosis not present

## 2019-10-28 ENCOUNTER — Encounter: Payer: Self-pay | Admitting: Family Medicine

## 2019-10-28 ENCOUNTER — Telehealth: Payer: Self-pay | Admitting: *Deleted

## 2019-10-28 ENCOUNTER — Other Ambulatory Visit: Payer: Self-pay

## 2019-10-28 ENCOUNTER — Ambulatory Visit (INDEPENDENT_AMBULATORY_CARE_PROVIDER_SITE_OTHER): Payer: Medicare Other | Admitting: Family Medicine

## 2019-10-28 VITALS — BP 118/70 | HR 72 | Temp 98.9°F | Resp 14 | Ht 65.0 in | Wt 125.0 lb

## 2019-10-28 DIAGNOSIS — Z0001 Encounter for general adult medical examination with abnormal findings: Secondary | ICD-10-CM | POA: Diagnosis not present

## 2019-10-28 DIAGNOSIS — E785 Hyperlipidemia, unspecified: Secondary | ICD-10-CM

## 2019-10-28 DIAGNOSIS — R350 Frequency of micturition: Secondary | ICD-10-CM

## 2019-10-28 DIAGNOSIS — E559 Vitamin D deficiency, unspecified: Secondary | ICD-10-CM | POA: Diagnosis not present

## 2019-10-28 DIAGNOSIS — J0100 Acute maxillary sinusitis, unspecified: Secondary | ICD-10-CM

## 2019-10-28 DIAGNOSIS — F419 Anxiety disorder, unspecified: Secondary | ICD-10-CM

## 2019-10-28 DIAGNOSIS — K861 Other chronic pancreatitis: Secondary | ICD-10-CM

## 2019-10-28 DIAGNOSIS — I10 Essential (primary) hypertension: Secondary | ICD-10-CM | POA: Diagnosis not present

## 2019-10-28 DIAGNOSIS — I700 Atherosclerosis of aorta: Secondary | ICD-10-CM | POA: Diagnosis not present

## 2019-10-28 DIAGNOSIS — Z0000 Encounter for general adult medical examination without abnormal findings: Secondary | ICD-10-CM

## 2019-10-28 DIAGNOSIS — R339 Retention of urine, unspecified: Secondary | ICD-10-CM | POA: Diagnosis not present

## 2019-10-28 DIAGNOSIS — E43 Unspecified severe protein-calorie malnutrition: Secondary | ICD-10-CM

## 2019-10-28 LAB — COMPLETE METABOLIC PANEL WITH GFR
AG Ratio: 2 (calc) (ref 1.0–2.5)
ALT: 22 U/L (ref 6–29)
AST: 23 U/L (ref 10–35)
Albumin: 4.3 g/dL (ref 3.6–5.1)
Alkaline phosphatase (APISO): 28 U/L — ABNORMAL LOW (ref 37–153)
BUN: 18 mg/dL (ref 7–25)
CO2: 28 mmol/L (ref 20–32)
Calcium: 9.6 mg/dL (ref 8.6–10.4)
Chloride: 104 mmol/L (ref 98–110)
Creat: 0.73 mg/dL (ref 0.60–0.93)
GFR, Est African American: 94 mL/min/{1.73_m2} (ref >=60)
GFR, Est Non African American: 81 mL/min/{1.73_m2} (ref >=60)
Globulin: 2.1 g/dL (calc) (ref 1.9–3.7)
Glucose, Bld: 109 mg/dL — ABNORMAL HIGH (ref 65–99)
Potassium: 4.9 mmol/L (ref 3.5–5.3)
Sodium: 141 mmol/L (ref 135–146)
Total Bilirubin: 0.4 mg/dL (ref 0.2–1.2)
Total Protein: 6.4 g/dL (ref 6.1–8.1)

## 2019-10-28 LAB — LIPID PANEL
Cholesterol: 119 mg/dL (ref <200)
HDL: 51 mg/dL (ref >=50)
LDL Cholesterol (Calc): 54 mg/dL (calc)
Non-HDL Cholesterol (Calc): 68 mg/dL (calc) (ref <130)
Total CHOL/HDL Ratio: 2.3 (calc) (ref <5.0)
Triglycerides: 56 mg/dL (ref <150)

## 2019-10-28 LAB — HEMOGLOBIN A1C
Hgb A1c MFr Bld: 7 % of total Hgb — ABNORMAL HIGH (ref <5.7)
Mean Plasma Glucose: 154 (calc)
eAG (mmol/L): 8.5 (calc)

## 2019-10-28 LAB — TEST AUTHORIZATION

## 2019-10-28 LAB — CBC WITH DIFFERENTIAL/PLATELET
Absolute Monocytes: 552 cells/uL (ref 200–950)
Basophils Absolute: 72 cells/uL (ref 0–200)
Basophils Relative: 1.2 %
Eosinophils Absolute: 192 cells/uL (ref 15–500)
Eosinophils Relative: 3.2 %
HCT: 37.6 % (ref 35.0–45.0)
Hemoglobin: 12.5 g/dL (ref 11.7–15.5)
Lymphs Abs: 1830 cells/uL (ref 850–3900)
MCH: 30.9 pg (ref 27.0–33.0)
MCHC: 33.2 g/dL (ref 32.0–36.0)
MCV: 93.1 fL (ref 80.0–100.0)
MPV: 11.5 fL (ref 7.5–12.5)
Monocytes Relative: 9.2 %
Neutro Abs: 3354 cells/uL (ref 1500–7800)
Neutrophils Relative %: 55.9 %
Platelets: 233 10*3/uL (ref 140–400)
RBC: 4.04 10*6/uL (ref 3.80–5.10)
RDW: 12.4 % (ref 11.0–15.0)
Total Lymphocyte: 30.5 %
WBC: 6 10*3/uL (ref 3.8–10.8)

## 2019-10-28 LAB — MICROALBUMIN / CREATININE URINE RATIO
Creatinine, Urine: 68 mg/dL (ref 20–275)
Microalb Creat Ratio: 34 mcg/mg creat — ABNORMAL HIGH (ref <30)
Microalb, Ur: 2.3 mg/dL

## 2019-10-28 LAB — MAGNESIUM: Magnesium: 1.2 mg/dL — ABNORMAL LOW (ref 1.5–2.5)

## 2019-10-28 MED ORDER — AMOXICILLIN 500 MG PO CAPS: 500.0000 mg | ORAL_CAPSULE | Freq: Two times a day (BID) | ORAL | 0 refills | Status: DC

## 2019-10-28 MED ORDER — BOOSTRIX 5-2.5-18.5 LF-MCG/0.5 IM SUSP
0.5000 mL | Freq: Once | INTRAMUSCULAR | 0 refills | Status: DC
Start: 2019-10-28 — End: 2019-10-29

## 2019-10-28 NOTE — Patient Instructions (Addendum)
Blood sugars in the evening on average should be less than 180 Tetanus sent to pharmacy  F/U 4 months

## 2019-10-28 NOTE — Telephone Encounter (Signed)
-----   Message from Alycia Rossetti, MD sent at 10/28/2019 10:54 AM EDT ----- Regarding: TDAP to pharmacy

## 2019-10-28 NOTE — Telephone Encounter (Signed)
Prescription sent to pharmacy.

## 2019-10-28 NOTE — Progress Notes (Signed)
Subjective:   Patient presents for Medicare Annual/Subsequent preventive examination.   Patient here for Medicare wellness visit.  Medications and history reviewed.  Diabetes mellitus in setting of chronic pancreatitisShe is currently on Metformin 1000 mg in the morning 500 mg in the evening.  She is also on Levemir 6 units.  CBGs in the morning are good highest 130's, at night her CBG are higher - records show 130-240's  No hypoglycemia symptoms Her GI symptoms are fairly controlled for the most part, hasa waves of nausea but past few weeks it has been good  Current A1C 7%   Chronic pancreatitis followed by gastroenterology.  She is trying to maintain her protein intake.  She has history of protein calorie malnutrition due to her pancreatitis.  Hypertension she is taking lisinopril 20 mg once a day without any side effects.  Hyperlipidemia pravastatin 10 mg and fenofibric acid 135 mg once a day  Lipids at goal, LDL  54   VITAMIN D def- due for recheck, taking 5000IU daily    Recent fasting labs reviewed at bedside   She does have some urinary urgency in the morning, but in general doesn't feel like she empties her bladder all the way . She does have pelvic pressure or cramping   Sinus pressure and drainage, taking allergy meds and nasal steroid but no mprovement, has pain over maxillary region  Admits to stressors, she is concerned about her husbands health as well as her own. Due to her medical problems, not able to be as physically active as she would like. She does enjoy readings and this helps calm her down  Review Past Medical/Family/Social: per EMR    Risk Factors  Current exercise habits: walks a little due to OA knees  Dietary issues discussed: Yes  Cardiac risk factors: DM, HTN, Hyperlipidemia   Depression Screen  (Note: if answer to either of the following is "Yes", a more complete depression screening is indicated)  Over the past two weeks, have you felt down,  depressed or hopeless? No Over the past two weeks, have you felt little interest or pleasure in doing things? No Have you lost interest or pleasure in daily life? No Do you often feel hopeless? No Do you cry easily over simple problems? No   Activities of Daily Living  In your present state of health, do you have any difficulty performing the following activities?:  Driving? No  Managing money? No  Feeding yourself? No  Getting from bed to chair? No  Climbing a flight of stairs? Yes   Preparing food and eating?: No  Bathing or showering? No  Getting dressed: No  Getting to the toilet? No  Using the toilet:No  Moving around from place to place: No  In the past year have you fallen or had a near fall?:No  Are you sexually active? No  Do you have more than one partner? No   Hearing Difficulties: No  Do you often ask people to speak up or repeat themselves? No  Do you experience ringing or noises in your ears? No Do you have difficulty understanding soft or whispered voices? No  Do you feel that you have a problem with memory? No Do you often misplace items? No  Do you feel safe at home? Yes  Cognitive Testing  Alert? Yes Normal Appearance?Yes  Oriented to person? Yes Place? Yes  Time? Yes  Recall of three objects? Yes  Can perform simple calculations? Yes  Displays appropriate judgment?Yes  Can  read the correct time from a watch face?Yes   List the Names of Other Physician/Practitioners you currently use:  Gastroenterology, orthopedics Dr. Lyla Glassing   Screening Tests / Date Colonoscopy    DUE , last done 2008    Pt declines per GI note               Zostavax  UTD  Mammogram  UTD  Influenza Vaccine  DUE  Tetanus/tdap COVID-19 UTD  PNA- UTD Bone Density- Normal 2018      ROS:  GEN- denies fatigue, fever, weight loss,weakness, recent illness HEENT- denies eye drainage, change in vision, +nasal discharge, CVS- denies chest pain, palpitations RESP- denies SOB,  cough, wheeze ABD- denies N/V, change in stools, abd pain GU- denies dysuria, hematuria, dribbling, incontinence MSK- + joint pain, muscle aches, injury Neuro- denies headache, dizziness, syncope, seizure activity  PHYSICAL: Vitals reviewed  GEN- NAD, alert and oriented x3 HEENT- PERRL, EOMI, non injected sclera, pink conjunctiva, MMM, oropharynx clear, TM clear no effusion, nares enlarged turbinates with congestion, +maxillary and frontal sinus tenderness Neck- Supple, no thryomegaly, no bruit CVS- RRR, no murmur RESP-CTAB PSYCH- normal affect and mood, PHQ score 0, GAD 7 score 3 EXT- No edema Pulses- Radial, DP- 2+   Assessment:    Annual wellness medicare exam   Plan:    During the course of the visit the patient was educated and counseled about appropriate screening and preventive services including:   DM- controlled no change to metformin Tresiba  Goal for her evening sugars less than 180 on average.  If they continue to rise we will try increasing her evening dose of Metformin to 750 mg.  Pretension well-controlled continue lisinopril.  Hyperlipidemia/aortic atherosclerosis lipids at goal no change statin drug.  Acute sinusitis we will treat with amoxicillin continue nasal saline Flonase allergy meds.  Frequency will check UA and culture but she also has retention symptoms I will refer her to urology for further evaluation.  Pancreatitis per gastroenterology.  Tdap sent to pharmacy  Discussed advanced directives handout given  Protein Calorie Malnutrition-   Mild anxiety -discussed some things to do help stressors, time with family, medication/prayer/ hobbies, no meds needed   F/U 4 months    Handi-cap placard given for OA knees      Diet review for nutrition referral? Yes ____ Not Indicated __x__  Patient Instructions (the written plan) was given to the patient.  Medicare Attestation  I have personally reviewed:  The patient's medical and social  history  Their use of alcohol, tobacco or illicit drugs  Their current medications and supplements  The patient's functional ability including ADLs,fall risks, home safety risks, cognitive, and hearing and visual impairment  Diet and physical activities  Evidence for depression or mood disorders  The patient's weight, height, BMI, and visual acuity have been recorded in the chart. I have made referrals, counseling, and provided education to the patient based on review of the above and I have provided the patient with a written personalized care plan for preventive services.

## 2019-10-29 LAB — URINALYSIS, ROUTINE W REFLEX MICROSCOPIC
Bacteria, UA: NONE SEEN /HPF
Bilirubin Urine: NEGATIVE
Hgb urine dipstick: NEGATIVE
Hyaline Cast: NONE SEEN /LPF
Ketones, ur: NEGATIVE
Nitrite: NEGATIVE
Protein, ur: NEGATIVE
RBC / HPF: NONE SEEN /HPF (ref 0–2)
Specific Gravity, Urine: 1.019 (ref 1.001–1.03)
pH: 5.5 (ref 5.0–8.0)

## 2019-10-29 LAB — URINE CULTURE
MICRO NUMBER:: 10803140
SPECIMEN QUALITY:: ADEQUATE

## 2019-10-29 MED ORDER — BOOSTRIX 5-2.5-18.5 LF-MCG/0.5 IM SUSP: 0.5000 mL | Freq: Once | INTRAMUSCULAR | 0 refills | Status: AC

## 2019-10-29 NOTE — Addendum Note (Signed)
Addended by: Sheral Flow on: 10/29/2019 03:08 PM   Modules accepted: Orders

## 2019-10-31 ENCOUNTER — Other Ambulatory Visit: Payer: Self-pay | Admitting: *Deleted

## 2019-10-31 ENCOUNTER — Other Ambulatory Visit: Payer: Self-pay | Admitting: Family Medicine

## 2019-10-31 DIAGNOSIS — E785 Hyperlipidemia, unspecified: Secondary | ICD-10-CM

## 2019-10-31 DIAGNOSIS — I10 Essential (primary) hypertension: Secondary | ICD-10-CM

## 2019-10-31 MED ORDER — FLUCONAZOLE 150 MG PO TABS
150.0000 mg | ORAL_TABLET | Freq: Once | ORAL | 0 refills | Status: AC
Start: 2019-10-31 — End: 2019-10-31

## 2019-10-31 MED ORDER — MAGNESIUM OXIDE 400 MG PO TABS: 400.0000 mg | ORAL_TABLET | Freq: Two times a day (BID) | ORAL | 3 refills | Status: DC

## 2019-12-12 ENCOUNTER — Other Ambulatory Visit: Payer: Self-pay

## 2019-12-12 ENCOUNTER — Ambulatory Visit (INDEPENDENT_AMBULATORY_CARE_PROVIDER_SITE_OTHER): Payer: Medicare Other | Admitting: Family Medicine

## 2019-12-12 VITALS — BP 130/70 | HR 91 | Temp 98.3°F | Ht 65.0 in | Wt 123.0 lb

## 2019-12-12 DIAGNOSIS — L282 Other prurigo: Secondary | ICD-10-CM | POA: Diagnosis not present

## 2019-12-12 MED ORDER — MOMETASONE FUROATE 0.1 % EX CREA: 1.0000 "application " | TOPICAL_CREAM | Freq: Every day | CUTANEOUS | 0 refills | Status: DC

## 2019-12-12 NOTE — Progress Notes (Signed)
Subjective:    Patient ID: Jennifer Porter, female    DOB: [birth year], 74 y.o.   MRN: [id number]  HPI Patient has a bulla on her anterior left shin.  Is roughly 2 cm in diameter.  It is filled with yellow serous fluid.  Underneath that is an erythematous macule that appears to be an insect bite.  She also has 2 similar lesions on the dorsum of her left foot.  Patient states that 1 of these was also a blister and it ruptured spontaneously on its own.  The lesion on the dorsum and lateral aspect of her left foot is 1 cm in diameter.  There is a smaller 6 mm erythematous papule proximal to that near the ankle.  Both of these appear to be papular urticaria.  She also has a 6 mm erythematous urticarial papule on her right bicep.  All of these appear to be insect bites.  Patient states that she has been outside recently.  They all appeared 1 afternoon after she felt something painful bite her on her left foot.  She removed her shoe but she was unable to see what was biting her.  The lesions appeared later that day.  Past Medical History:  Diagnosis Date  . Allergy    seasonal  . Asthma   . Complication of anesthesia   . Diabetes mellitus without complication ([place])   . GERD (gastroesophageal reflux disease)    hiatal hernia  . H/O: gout   . Hyperlipidemia   . Hypertension   . Pancreatitis   . PONV (postoperative nausea and vomiting)    Past Surgical History:  Procedure Laterality Date  . ABDOMINAL HYSTERECTOMY    . APPENDECTOMY    . Arthroscopic Left Knee Left 03/22/2003  . CHOLECYSTECTOMY     in 1990s  . ESOPHAGOGASTRODUODENOSCOPY (EGD) WITH PROPOFOL N/A 11/08/2018   Procedure: ESOPHAGOGASTRODUODENOSCOPY (EGD) WITH PROPOFOL;  Surgeon: [name], MD;  Location: [facility];  Service: Endoscopy;  Laterality: N/A;  . EUS N/A 11/08/2018   Procedure: UPPER ENDOSCOPIC ULTRASOUND (EUS) RADIAL;  Surgeon: [name], MD;  Location: [facility];  Service: Endoscopy;  Laterality: N/A;    Current Outpatient Medications on File Prior to Visit  Medication Sig Dispense Refill  . albuterol (PROVENTIL HFA;VENTOLIN HFA) 108 (90 Base) MCG/ACT inhaler Inhale 2 puffs into the lungs every 6 (six) hours as needed for wheezing or shortness of breath. (Patient not taking: Reported on 10/28/2019) 1 Inhaler 0  . amoxicillin (AMOXIL) 500 MG capsule Take 1 capsule (500 mg total) by mouth 2 (two) times daily. 14 capsule 0  . aspirin EC 81 MG tablet Take 81 mg by mouth daily.    . Cholecalciferol (VITAMIN D3) 125 MCG (5000 UT) TABS Take 5,000 Units by mouth daily.    . Choline Fenofibrate (FENOFIBRIC ACID) 135 MG CPDR TAKE 1 CAPSULE BY MOUTH EVERY NIGHT AT BEDTIME 90 capsule 0  . CREON 36000 units CPEP capsule TAKE TWO CAPSULES BY MOUTH WITH MEALS AND ONE WITH SNACKS (UP TO 8 A DAY) 240 capsule 5  . Diclofenac Sodium CR 100 MG 24 hr tablet Take 100 mg by mouth as needed.     . dicyclomine (BENTYL) 10 MG capsule Take one capsule up to twice daily for abdominal cramping or loose stools. 60 capsule 3  . estradiol (ESTRACE) 1 MG tablet TAKE ONE TABLET (1MG  TOTAL) BY MOUTH DAILY 30 tablet 3  . fluticasone (FLONASE) 50 MCG/ACT nasal spray Place 2 sprays into  both nostrils daily as needed. (Patient taking differently: Place 2 sprays into both nostrils daily as needed for allergies. ) 16 g 5  . gabapentin (NEURONTIN) 300 MG capsule TAKE 1 CAPSULE BY MOUTH THREE TIMES A DAY (Patient taking differently: Take 300 mg by mouth daily. ) 90 capsule 1  . insulin detemir (LEVEMIR FLEXTOUCH) 100 UNIT/ML FlexPen Inject 5-50 Units into the skin at bedtime. As directed by your provider. (Patient taking differently: Inject 6 Units into the skin at bedtime. As directed by your provider.) 15 mL 0  . Insulin Pen Needle (BD PEN NEEDLE NANO U/F) 32G X 4 MM MISC 1 each by Does not apply route daily. 100 each 1  . lisinopril (ZESTRIL) 20 MG tablet Take 1 tablet (20 mg total) by mouth daily. 90 tablet 3  . magnesium oxide (MAG-OX)  400 MG tablet Take 1 tablet (400 mg total) by mouth 2 (two) times daily. 60 tablet 3  . metFORMIN (GLUCOPHAGE) 500 MG tablet TAKE TWO (2) TABLETS BY MOUTH 2 TIMES DAILY WITH A MEAL 120 tablet 3  . pantoprazole (PROTONIX) 40 MG tablet Take one tablet once or twice daily, 30 minutes before a meal. 180 tablet 3  . pravastatin (PRAVACHOL) 10 MG tablet TAKE 1 TABLET (10 MG TOTAL) BY MOUTH AT BEDTIME 90 tablet 0  . Probiotic Product (PROBIOTIC DAILY PO) Take by mouth.     No current facility-administered medications on file prior to visit.   Allergies  Allergen Reactions  . Codeine Nausea And Vomiting  . Jardiance [Empagliflozin]   . Macrobid [Nitrofurantoin] Hives  . Simvastatin     headache   Social History   Socioeconomic History  . Marital status: Married    Spouse name: Not on file  . Number of children: Not on file  . Years of education: Not on file  . Highest education level: Not on file  Occupational History  . Not on file  Tobacco Use  . Smoking status: Never Smoker  . Smokeless tobacco: Never Used  Vaping Use  . Vaping Use: Never used  Substance and Sexual Activity  . Alcohol use: No  . Drug use: No  . Sexual activity: Not on file  Other Topics Concern  . Not on file  Social History Narrative   Retired March 2014.   Did teach Kindergarten at [employer]   Social Determinants of Health   Financial Resource Strain:   . Difficulty of Paying Living Expenses: Not on file  Food Insecurity:   . Worried About Charity fundraiser in the Last Year: Not on file  . Ran Out of Food in the Last Year: Not on file  Transportation Needs:   . Lack of Transportation (Medical): Not on file  . Lack of Transportation (Non-Medical): Not on file  Physical Activity:   . Days of Exercise per Week: Not on file  . Minutes of Exercise per Session: Not on file  Stress:   . Feeling of Stress : Not on file  Social Connections:   . Frequency of Communication with Friends and Family:  Not on file  . Frequency of Social Gatherings with Friends and Family: Not on file  . Attends Religious Services: Not on file  . Active Member of Clubs or Organizations: Not on file  . Attends Archivist Meetings: Not on file  . Marital Status: Not on file  Intimate Partner Violence:   . Fear of Current or Ex-Partner: Not on file  . Emotionally  Abused: Not on file  . Physically Abused: Not on file  . Sexually Abused: Not on file      Review of Systems  All other systems reviewed and are negative.      Objective:   Physical Exam Constitutional:      General: She is not in acute distress.    Appearance: Normal appearance. She is normal weight. She is not ill-appearing or toxic-appearing.  Cardiovascular:     Rate and Rhythm: Normal rate and regular rhythm.     Pulses: Normal pulses.     Heart sounds: Normal heart sounds. No murmur heard.  No friction rub. No gallop.   Pulmonary:     Effort: Pulmonary effort is normal. No respiratory distress.     Breath sounds: Normal breath sounds. No stridor. No wheezing, rhonchi or rales.  Chest:     Chest wall: No tenderness.  Musculoskeletal:       Arms:       Legs:  Neurological:     Mental Status: She is alert.         Assessment & Plan:  Papular urticaria  I believe these are result of  insect bites.  I suspect ants versus chiggers versus some type of spider.  There is no evidence of secondary cellulitis.  I ruptured the bulla on the anterior surface of the left shin.  Recommended Elocon cream be applied once or twice daily to the affected lesions to hasten the resolution.  Monitor for secondary cellulitis.

## 2019-12-16 ENCOUNTER — Encounter: Payer: Self-pay | Admitting: Urology

## 2019-12-16 ENCOUNTER — Other Ambulatory Visit: Payer: Self-pay

## 2019-12-16 ENCOUNTER — Other Ambulatory Visit: Payer: Self-pay | Admitting: Family Medicine

## 2019-12-16 ENCOUNTER — Ambulatory Visit (INDEPENDENT_AMBULATORY_CARE_PROVIDER_SITE_OTHER): Payer: Medicare Other | Admitting: Urology

## 2019-12-16 VITALS — BP 157/77 | HR 93 | Temp 98.0°F | Ht 65.0 in | Wt 123.0 lb

## 2019-12-16 DIAGNOSIS — R339 Retention of urine, unspecified: Secondary | ICD-10-CM

## 2019-12-16 DIAGNOSIS — R3915 Urgency of urination: Secondary | ICD-10-CM

## 2019-12-16 LAB — URINALYSIS, ROUTINE W REFLEX MICROSCOPIC
Bilirubin, UA: NEGATIVE
Ketones, UA: NEGATIVE
Leukocytes,UA: NEGATIVE
Nitrite, UA: NEGATIVE
Protein,UA: NEGATIVE
RBC, UA: NEGATIVE
Specific Gravity, UA: 1.025 (ref 1.005–1.030)
Urobilinogen, Ur: 0.2 mg/dL (ref 0.2–1.0)
pH, UA: 5 (ref 5.0–7.5)

## 2019-12-16 LAB — BLADDER SCAN AMB NON-IMAGING: Scan Result: 8

## 2019-12-16 MED ORDER — MIRABEGRON ER 25 MG PO TB24: 25.0000 mg | ORAL_TABLET | Freq: Every day | ORAL | 0 refills | Status: DC

## 2019-12-16 NOTE — Progress Notes (Signed)
12/16/2019 9:28 AM   Jennifer Porter [birth year] [id number]  Referring provider: Alycia Rossetti, MD [address] [place],  [place] [address]  Urinary urgency and feeling of incomplete emptying  HPI: Jennifer Porter is a 74yo here for evaluation of urinary urgency and a feeling of incomplete bladder emptying. She has noted worsening lower urinary tract symptoms over the past 3 months. She notes increased stress due to her husbands chronic medical problems. She has urinary urgency and urge incontinence 1x per day. She has urinary frequency every 1-2 hours.  Nocturia 0x. She does have an intense urgency with her first void in the morning. No dysuria or hematuria. Occasional starting/stopping of urinary stream. She has occasional urinary hesitancy. Urinary stream strong.   PMH: Past Medical History:  Diagnosis Date  . Allergy    seasonal  . Asthma   . Complication of anesthesia   . Diabetes mellitus without complication ([place])   . GERD (gastroesophageal reflux disease)    hiatal hernia  . H/O: gout   . Hyperlipidemia   . Hypertension   . Pancreatitis   . PONV (postoperative nausea and vomiting)     Surgical History: Past Surgical History:  Procedure Laterality Date  . ABDOMINAL HYSTERECTOMY    . APPENDECTOMY    . Arthroscopic Left Knee Left 03/22/2003  . CHOLECYSTECTOMY     in 1990s  . ESOPHAGOGASTRODUODENOSCOPY (EGD) WITH PROPOFOL N/A 11/08/2018   Procedure: ESOPHAGOGASTRODUODENOSCOPY (EGD) WITH PROPOFOL;  Surgeon: [name], MD;  [facility];  Service: Endoscopy;  Laterality: N/A;  . EUS N/A 11/08/2018   Procedure: UPPER ENDOSCOPIC ULTRASOUND (EUS) RADIAL;  Surgeon: [name], MD;  [facility];  Service: Endoscopy;  Laterality: N/A;    Home Medications:  Allergies as of 12/16/2019      Reactions   Codeine Nausea And Vomiting   Jardiance [empagliflozin]    Macrobid [nitrofurantoin] Hives   Simvastatin    headache      Medication List        Accurate as of December 16, 2019  9:28 AM. If you have any questions, ask your nurse or doctor.        albuterol 108 (90 Base) MCG/ACT inhaler Commonly known as: VENTOLIN HFA Inhale 2 puffs into the lungs every 6 (six) hours as needed for wheezing or shortness of breath.   aspirin EC 81 MG tablet Take 81 mg by mouth daily.   BD Pen Needle Nano U/F 32G X 4 MM Misc Generic drug: Insulin Pen Needle 1 each by Does not apply route daily.   Creon 36000 UNITS Cpep capsule Generic drug: lipase/protease/amylase TAKE TWO CAPSULES BY MOUTH WITH MEALS AND ONE WITH SNACKS (UP TO 8 A DAY)   Diclofenac Sodium CR 100 MG 24 hr tablet Take 100 mg by mouth as needed.   dicyclomine 10 MG capsule Commonly known as: Bentyl Take one capsule up to twice daily for abdominal cramping or loose stools.   estradiol 1 MG tablet Commonly known as: ESTRACE TAKE ONE TABLET (1MG  TOTAL) BY MOUTH DAILY   Fenofibric Acid 135 MG Cpdr TAKE 1 CAPSULE BY MOUTH EVERY NIGHT AT BEDTIME   fluticasone 50 MCG/ACT nasal spray Commonly known as: FLONASE Place 2 sprays into both nostrils daily as needed. What changed: reasons to take this   gabapentin 300 MG capsule Commonly known as: NEURONTIN TAKE 1 CAPSULE BY MOUTH THREE TIMES A DAY What changed: See the new instructions.   Levemir FlexTouch  100 UNIT/ML FlexPen Generic drug: insulin detemir Inject 5-50 Units into the skin at bedtime. As directed by your provider. What changed: how much to take   lisinopril 20 MG tablet Commonly known as: ZESTRIL Take 1 tablet (20 mg total) by mouth daily.   magnesium oxide 400 (241.3 Mg) MG tablet Commonly known as: MAG-OX Take 1 tablet by mouth 2 (two) times daily.   magnesium oxide 400 MG tablet Commonly known as: MAG-OX Take 1 tablet (400 mg total) by mouth 2 (two) times daily.   metFORMIN 500 MG tablet Commonly known as: GLUCOPHAGE TAKE TWO (2) TABLETS BY MOUTH 2 TIMES DAILY WITH A MEAL   mometasone  0.1 % cream Commonly known as: Elocon Apply 1 application topically daily.   pantoprazole 40 MG tablet Commonly known as: PROTONIX Take one tablet once or twice daily, 30 minutes before a meal.   pravastatin 10 MG tablet Commonly known as: PRAVACHOL TAKE 1 TABLET (10 MG TOTAL) BY MOUTH AT BEDTIME   PROBIOTIC DAILY PO Take by mouth.   Vitamin D3 125 MCG (5000 UT) Tabs Take 5,000 Units by mouth daily.       Allergies:  Allergies  Allergen Reactions  . Codeine Nausea And Vomiting  . Jardiance [Empagliflozin]   . Macrobid [Nitrofurantoin] Hives  . Simvastatin     headache    Family History: Family History  Problem Relation Age of Onset  . Stroke Mother   . Diabetes Mother   . Vision loss Mother        eye removed  . Cancer Mother        cancer in eye - removed  . Diabetes Brother   . Heart disease Father   . Colon cancer Sister   . Uterine cancer Paternal Grandmother   . Uterine cancer Maternal Aunt   . Pancreatic cancer Maternal Aunt     Social History:  reports that she has never smoked. She has never used smokeless tobacco. She reports that she does not drink alcohol and does not use drugs.  ROS: All other review of systems were reviewed and are negative except what is noted above in HPI  Physical Exam: BP (!) 157/77   Pulse 93   Temp 98 F (36.7 C)   Ht 5\' 5"  (1.651 m)   Wt 123 lb (55.8 kg)   BMI 20.47 kg/m   Constitutional:  Alert and oriented, No acute distress. HEENT: [employer] AT, moist mucus membranes.  Trachea midline, no masses. Cardiovascular: No clubbing, cyanosis, or edema. Respiratory: Normal respiratory effort, no increased work of breathing. GI: Abdomen is soft, nontender, nondistended, no abdominal masses GU: No CVA tenderness.  Lymph: No cervical or inguinal lymphadenopathy. Skin: No rashes, bruises or suspicious lesions. Neurologic: Grossly intact, no focal deficits, moving all 4 extremities. Psychiatric: Normal mood and  affect.  Laboratory Data: Lab Results  Component Value Date   WBC 6.0 10/24/2019   HGB 12.5 10/24/2019   HCT 37.6 10/24/2019   MCV 93.1 10/24/2019   PLT 233 10/24/2019    Lab Results  Component Value Date   CREATININE 0.73 10/24/2019    No results found for: PSA  No results found for: TESTOSTERONE  Lab Results  Component Value Date   HGBA1C 7.0 (H) 10/24/2019    Urinalysis    Component Value Date/Time   COLORURINE YELLOW 10/28/2019 1026   APPEARANCEUR Clear 12/16/2019 0920   LABSPEC 1.019 10/28/2019 1026   PHURINE 5.5 10/28/2019 1026   GLUCOSEU 1+ (A) 12/16/2019  [place] 10/28/2019 1026   BILIRUBINUR Negative 12/16/2019 0920   KETONESUR NEGATIVE 10/28/2019 1026   PROTEINUR Negative 12/16/2019 0920   PROTEINUR NEGATIVE 10/28/2019 1026   UROBILINOGEN 0.2 03/24/2014 1133   NITRITE Negative 12/16/2019 0920   NITRITE NEGATIVE 10/28/2019 1026   LEUKOCYTESUR Negative 12/16/2019 0920   LEUKOCYTESUR 1+ (A) 10/28/2019 1026    Lab Results  Component Value Date   LABMICR Comment 12/16/2019   BACTERIA NONE SEEN 10/28/2019    Pertinent Imaging:  No results found for this or any previous visit.  No results found for this or any previous visit.  No results found for this or any previous visit.  No results found for this or any previous visit.  No results found for this or any previous visit.  No results found for this or any previous visit.  No results found for this or any previous visit.  No results found for this or any previous visit.   Assessment & Plan:    1. Urinary retention -Patient able to empty well today. She does have voiding dysfunction which we will start therapy after we have addressed her OAB symptoms - Urinalysis, Routine w reflex microscopic - BLADDER SCAN AMB NON-IMAGING  2. Urinary urgency -We will trial mirabegron 25mg  daily. RTC 4 weeks with PVR   No follow-ups on file.  Nicolette Bang, MD  [facility]  [place]

## 2019-12-16 NOTE — Progress Notes (Signed)
Urological Symptom Review  Patient is experiencing the following symptoms: Frequent urination Hard to postpone urination Stream starts and stops Urinary tract infection Painful intercourse  Kidney stones   Review of Systems  Gastrointestinal (upper)  : Indigestion/heartburn  Gastrointestinal (lower) : Diarrhea  Constitutional : Weight loss Fatigue  Skin: Itching  Eyes: Blurred vision  Ear/Nose/Throat : Sinus problems  Hematologic/Lymphatic: Negative for Hematologic/Lymphatic symptoms  Cardiovascular : Negative for cardiovascular symptoms  Respiratory : Negative for respiratory symptoms  Endocrine: Negative for endocrine symptoms  Musculoskeletal: Back pain Joint pain  Neurological: Negative for neurological symptoms  Psychologic: Anxiety

## 2019-12-16 NOTE — Patient Instructions (Signed)

## 2019-12-24 ENCOUNTER — Telehealth: Payer: Self-pay | Admitting: Gastroenterology

## 2019-12-24 ENCOUNTER — Encounter: Payer: Self-pay | Admitting: Gastroenterology

## 2019-12-24 ENCOUNTER — Ambulatory Visit (INDEPENDENT_AMBULATORY_CARE_PROVIDER_SITE_OTHER): Payer: Medicare Other | Admitting: Gastroenterology

## 2019-12-24 ENCOUNTER — Other Ambulatory Visit: Payer: Self-pay

## 2019-12-24 VITALS — BP 141/74 | HR 88 | Temp 97.5°F | Ht 64.5 in | Wt 127.0 lb

## 2019-12-24 DIAGNOSIS — K219 Gastro-esophageal reflux disease without esophagitis: Secondary | ICD-10-CM | POA: Diagnosis not present

## 2019-12-24 DIAGNOSIS — K861 Other chronic pancreatitis: Secondary | ICD-10-CM | POA: Diagnosis not present

## 2019-12-24 MED ORDER — PANCRELIPASE (LIP-PROT-AMYL) 36000-114000 UNITS PO CPEP: ORAL_CAPSULE | ORAL | 11 refills | Status: DC

## 2019-12-24 MED ORDER — PANTOPRAZOLE SODIUM 40 MG PO TBEC: DELAYED_RELEASE_TABLET | ORAL | 3 refills | Status: DC

## 2019-12-24 NOTE — Telephone Encounter (Signed)
Spoke with pt. Pt is going to have labs completed at her next apt with her PCP. Lab orders mailed.

## 2019-12-24 NOTE — Telephone Encounter (Signed)
Please let pt know after she left office, I was looking through her records. Would consider lab to check for autoimmune pancreatitis as we had not screened for that and we don't have an explanation for the cause of her chronic pancreatitis.   I have put the lab in. She can wait and get it done with her next PCP labs or go at any time.

## 2019-12-24 NOTE — Progress Notes (Signed)
Primary Care Physician: Alycia Rossetti, MD  Primary Gastroenterologist:  Garfield Cornea, MD   Chief Complaint  Patient presents with  . Pancreatitis    doing better but it still hurts daily     HPI: Jennifer Porter is a 74 y.o. female here for follow-up.  Last seen in June 2021.  She has a history of chronic pancreatitis based on EUS/MRCP findings in 2020.  Evidence of exocrine/endocrine deficiency.  Currently on Creon 72,000 units with meals, 36,000 units with snacks. Etiology of chronic pancreatitis unknown. No FH of pancreatitis. No etoh history. EUS without evidence of biliary etiology, gallbladder has been removed. No pancreatic duct stone on MRCP.    She continues to have some daily pain although she notes no progression from her last OV and overall much improved from her initial evaluation in 2020. Sometimes epigastric pain, but sometimes in lower abdomen. She did not find improvement with probiotics she tried 3 different ones.  Some made her constipated.  Really did not find great benefit.  Diarrhea not a big issue right now.  No melena or rectal bleeding.  Dicyclomine seems to be helpful with abdominal pain although continues to have some pain every day.  Learning to live with it.  Watches her diet very closely, tries avoid fatty/greasy foods.  Low-carb diet due to her diabetes.  Tries not to overeat or eat late.  Sometimes does have some reflux symptoms.  No nausea or vomiting.  She feels like some of her GI issues are IBS and GERD related.  She also notes she is under a lot of stress with her husband's chronic illnesses.   Also has to use some diclofenac for knee pain. If takes 2-3 days straight then can get good relief and will not have to take it. Takes only one daily.  June 2020: Under 56.4 pound February 2021: 130 pounds April 2021: 126 pounds June 2021: 127.2 pounds December 16, 2019: 123 pounds Today: 127 pounds    Current Outpatient Medications  Medication  Sig Dispense Refill  . albuterol (PROVENTIL HFA;VENTOLIN HFA) 108 (90 Base) MCG/ACT inhaler Inhale 2 puffs into the lungs every 6 (six) hours as needed for wheezing or shortness of breath. 1 Inhaler 0  . aspirin EC 81 MG tablet Take 81 mg by mouth daily.    . Cholecalciferol (VITAMIN D3) 125 MCG (5000 UT) TABS Take 5,000 Units by mouth daily.    . Choline Fenofibrate (FENOFIBRIC ACID) 135 MG CPDR TAKE 1 CAPSULE BY MOUTH EVERY NIGHT AT BEDTIME 90 capsule 0  . CREON 36000 units CPEP capsule TAKE TWO CAPSULES BY MOUTH WITH MEALS AND ONE WITH SNACKS (UP TO 8 A DAY) 240 capsule 5  . Diclofenac Sodium CR 100 MG 24 hr tablet Take 100 mg by mouth as needed.     . dicyclomine (BENTYL) 10 MG capsule Take one capsule up to twice daily for abdominal cramping or loose stools. 60 capsule 3  . estradiol (ESTRACE) 1 MG tablet TAKE ONE TABLET (1MG  TOTAL) BY MOUTH DAILY 30 tablet 3  . fluticasone (FLONASE) 50 MCG/ACT nasal spray Place 2 sprays into both nostrils daily as needed. (Patient taking differently: Place 2 sprays into both nostrils daily as needed for allergies. ) 16 g 5  . gabapentin (NEURONTIN) 300 MG capsule TAKE 1 CAPSULE BY MOUTH THREE TIMES A DAY (Patient taking differently: Take 300 mg by mouth daily. ) 90 capsule 1  . insulin detemir (LEVEMIR FLEXTOUCH) 100 UNIT/ML  FlexPen Inject 5-50 Units into the skin at bedtime. As directed by your provider. (Patient taking differently: Inject 5 Units into the skin at bedtime. As directed by your provider.) 15 mL 0  . lisinopril (ZESTRIL) 20 MG tablet Take 1 tablet (20 mg total) by mouth daily. 90 tablet 3  . magnesium oxide (MAG-OX) 400 (241.3 Mg) MG tablet Take 1 tablet by mouth 2 (two) times daily.    . metFORMIN (GLUCOPHAGE) 500 MG tablet TAKE TWO (2) TABLETS BY MOUTH 2 TIMES DAILY WITH A MEAL 120 tablet 3  . mirabegron ER (MYRBETRIQ) 25 MG TB24 tablet Take 1 tablet (25 mg total) by mouth daily. 30 tablet 0  . mometasone (ELOCON) 0.1 % cream Apply 1  application topically daily. 45 g 0  . pantoprazole (PROTONIX) 40 MG tablet Take one tablet once or twice daily, 30 minutes before a meal. 180 tablet 3  . pravastatin (PRAVACHOL) 10 MG tablet TAKE 1 TABLET (10 MG TOTAL) BY MOUTH AT BEDTIME 90 tablet 0  . Probiotic Product (PROBIOTIC DAILY PO) Take by mouth daily as needed.     . Insulin Pen Needle (BD PEN NEEDLE NANO U/F) 32G X 4 MM MISC 1 each by Does not apply route daily. 100 each 1  . magnesium oxide (MAG-OX) 400 MG tablet Take 1 tablet (400 mg total) by mouth 2 (two) times daily. 60 tablet 3   No current facility-administered medications for this visit.    Allergies as of 12/24/2019 - Review Complete 12/24/2019  Allergen Reaction Noted  . Codeine Nausea And Vomiting 11/28/2012  . Jardiance [empagliflozin]  02/08/2019  . Macrobid [nitrofurantoin] Hives 11/28/2012  . Simvastatin  10/26/2017    ROS:  General: Negative for anorexia, weight loss, fever, chills, fatigue, weakness.  See HPI ENT: Negative for hoarseness, difficulty swallowing , nasal congestion. CV: Negative for chest pain, angina, palpitations, dyspnea on exertion, peripheral edema.  Respiratory: Negative for dyspnea at rest, dyspnea on exertion, cough, sputum, wheezing.  GI: See history of present illness. GU:  Negative for dysuria, hematuria,    Endo: Negative for unusual weight change.    Physical Examination:   BP (!) 141/74   Pulse 88   Temp (!) 97.5 F (36.4 C) (Temporal)   Ht 5' 4.5" (1.638 m)   Wt 127 lb (57.6 kg)   BMI 21.46 kg/m   General: Well-nourished, well-developed in no acute distress.  Eyes: No icterus. Mouth: Oropharyngeal mucosa moist and pink , no lesions erythema or exudate. Lungs: Clear to auscultation bilaterally.  Heart: Regular rate and rhythm, no murmurs rubs or gallops.  Abdomen: Bowel sounds are normal,  nondistended, no hepatosplenomegaly or masses, no abdominal bruits or hernia , no rebound or guarding.  Mild diffuse  tenderness Extremities: No lower extremity edema. No clubbing or deformities. Neuro: Alert and oriented x 4   Skin: Warm and dry, no jaundice.   Psych: Alert and cooperative, normal mood and affect.  Labs:  Lab Results  Component Value Date   HGBA1C 7.0 (H) 10/24/2019   Lab Results  Component Value Date   CREATININE 0.73 10/24/2019   BUN 18 10/24/2019   NA 141 10/24/2019   K 4.9 10/24/2019   CL 104 10/24/2019   CO2 28 10/24/2019   Lab Results  Component Value Date   ALT 22 10/24/2019   AST 23 10/24/2019   ALKPHOS 17 (L) 08/24/2018   BILITOT 0.4 10/24/2019   Lab Results  Component Value Date   WBC 6.0 10/24/2019  HGB 12.5 10/24/2019   HCT 37.6 10/24/2019   MCV 93.1 10/24/2019   PLT 233 10/24/2019   Lab Results  Component Value Date   CHOL 119 10/24/2019   HDL 51 10/24/2019   LDLCALC 54 10/24/2019   TRIG 56 10/24/2019   CHOLHDL 2.3 10/24/2019     Imaging Studies: No results found.  Impression/plan:  74 y/o female with chronic pancreatitis noted on EUS/MRCP in 2020.  Etiology unknown. Overall her abdominal pain is significantly improved from initial diagnosis last year but she continues to have some daily abdominal discomfort both epigastric and lower abdominal pain which may be multifactorial.  Cannot exclude an element of underlying IBS and she has noted some improvement with dicyclomine.  Bowel function has improved with less episodes of loose stools as well.  A1c overall down from 1 year ago, currently 7.  Reflux is well controlled.  Recommend her continue Creon 2 with meals and 1 with snacks.  Continue pantoprazole 1 to twice daily before meals for reflux.  Continue dicyclomine up to twice daily as needed for abdominal cramping and loose stools.  Consider IgG 4 level with next labs.   Return to the office in six months or sooner if needed.

## 2019-12-24 NOTE — Patient Instructions (Addendum)
1. Continue Creon two with meals and one with snacks.  2. Continue pantoprazole once or twice daily before a meal for reflux. 3. You can continue dicyclomine up to twice daily for abdominal cramping and loose stools as needed.  4. If you have new symptoms, worsening abdominal pain, weight loss please let me know.  5. Return to the office in six months.

## 2019-12-31 ENCOUNTER — Other Ambulatory Visit: Payer: Self-pay | Admitting: Family Medicine

## 2020-01-15 ENCOUNTER — Ambulatory Visit (INDEPENDENT_AMBULATORY_CARE_PROVIDER_SITE_OTHER): Payer: Medicare Other | Admitting: Urology

## 2020-01-15 ENCOUNTER — Other Ambulatory Visit: Payer: Self-pay

## 2020-01-15 ENCOUNTER — Encounter: Payer: Self-pay | Admitting: Urology

## 2020-01-15 VITALS — BP 171/81 | HR 99 | Temp 97.7°F | Wt 123.0 lb

## 2020-01-15 DIAGNOSIS — R339 Retention of urine, unspecified: Secondary | ICD-10-CM

## 2020-01-15 DIAGNOSIS — R3915 Urgency of urination: Secondary | ICD-10-CM | POA: Diagnosis not present

## 2020-01-15 LAB — URINALYSIS, ROUTINE W REFLEX MICROSCOPIC
Bilirubin, UA: NEGATIVE
Glucose, UA: NEGATIVE
Ketones, UA: NEGATIVE
Nitrite, UA: NEGATIVE
Protein,UA: NEGATIVE
RBC, UA: NEGATIVE
Specific Gravity, UA: 1.02 (ref 1.005–1.030)
Urobilinogen, Ur: 0.2 mg/dL (ref 0.2–1.0)
pH, UA: 5.5 (ref 5.0–7.5)

## 2020-01-15 LAB — MICROSCOPIC EXAMINATION: RBC, Urine: NONE SEEN /hpf (ref 0–2)

## 2020-01-15 LAB — BLADDER SCAN AMB NON-IMAGING: Scan Result: 0

## 2020-01-15 MED ORDER — MIRABEGRON ER 25 MG PO TB24
25.0000 mg | ORAL_TABLET | Freq: Every day | ORAL | 11 refills | Status: DC
Start: 2020-01-15 — End: 2020-11-26

## 2020-01-15 NOTE — Progress Notes (Signed)

## 2020-01-15 NOTE — Progress Notes (Signed)
01/15/2020 9:25 AM   Jennifer Porter April 29, 1945 [id number]  Referring provider: Alycia Rossetti, MD [address] [place],  [place] [address]  Followup OAB and dysfunctional voiding  HPI: Ms Jennifer Porter is a 74yo here for followup for OAB and voiding dysfunction. She was given mirabegron 25mg  last visit. PVR 0cc. The mirbaegron 25mg  improved her urgency, frequency. The feeling of incomplete emptying is improving. Stream good.  She is happy with her LUTS at this time.   PMH: Past Medical History:  Diagnosis Date  . Allergy    seasonal  . Asthma   . Complication of anesthesia   . Diabetes mellitus without complication ([place])   . GERD (gastroesophageal reflux disease)    hiatal hernia  . H/O: gout   . Hyperlipidemia   . Hypertension   . Pancreatitis   . PONV (postoperative nausea and vomiting)     Surgical History: Past Surgical History:  Procedure Laterality Date  . ABDOMINAL HYSTERECTOMY    . APPENDECTOMY    . Arthroscopic Left Knee Left 03/22/2003  . CHOLECYSTECTOMY     in 1990s  . ESOPHAGOGASTRODUODENOSCOPY (EGD) WITH PROPOFOL N/A 11/08/2018   Procedure: ESOPHAGOGASTRODUODENOSCOPY (EGD) WITH PROPOFOL;  Surgeon: [name], MD;  Location: [facility];  Service: Endoscopy;  Laterality: N/A;  . EUS N/A 11/08/2018   Procedure: UPPER ENDOSCOPIC ULTRASOUND (EUS) RADIAL;  Surgeon: [name], MD;  Location: [facility];  Service: Endoscopy;  Laterality: N/A;    Home Medications:  Allergies as of 01/15/2020      Reactions   Codeine Nausea And Vomiting   Jardiance [empagliflozin]    Macrobid [nitrofurantoin] Hives   Simvastatin    headache      Medication List       Accurate as of January 15, 2020  9:25 AM. If you have any questions, ask your nurse or doctor.        albuterol 108 (90 Base) MCG/ACT inhaler Commonly known as: VENTOLIN HFA Inhale 2 puffs into the lungs every 6 (six) hours as needed for wheezing or shortness of breath.   aspirin EC 81  MG tablet Take 81 mg by mouth daily.   BD Pen Needle Nano U/F 32G X 4 MM Misc Generic drug: Insulin Pen Needle 1 each by Does not apply route daily.   Diclofenac Sodium CR 100 MG 24 hr tablet Take 100 mg by mouth as needed.   dicyclomine 10 MG capsule Commonly known as: Bentyl Take one capsule up to twice daily for abdominal cramping or loose stools.   estradiol 1 MG tablet Commonly known as: ESTRACE TAKE ONE TABLET (1MG  TOTAL) BY MOUTH DAILY   Fenofibric Acid 135 MG Cpdr TAKE 1 CAPSULE BY MOUTH EVERY NIGHT AT BEDTIME   fluticasone 50 MCG/ACT nasal spray Commonly known as: FLONASE Place 2 sprays into both nostrils daily as needed. What changed: reasons to take this   gabapentin 300 MG capsule Commonly known as: NEURONTIN Take 1 capsule (300 mg total) by mouth daily.   Levemir FlexTouch 100 UNIT/ML FlexPen Generic drug: insulin detemir INJECT 5 TO 50 UNITS INTO THE SKIN AT BEDTIME AS DIRECTED BY YOUR PROVIDER   lipase/protease/amylase 36000 UNITS Cpep capsule Commonly known as: Creon TAKE TWO CAPSULES BY MOUTH WITH MEALS AND ONE WITH SNACKS (UP TO 8 A DAY)   lisinopril 20 MG tablet Commonly known as: ZESTRIL Take 1 tablet (20 mg total) by mouth daily.   magnesium oxide 400 (241.3 Mg) MG tablet Commonly  known as: MAG-OX Take 1 tablet by mouth 2 (two) times daily.   magnesium oxide 400 MG tablet Commonly known as: MAG-OX Take 1 tablet (400 mg total) by mouth 2 (two) times daily.   metFORMIN 500 MG tablet Commonly known as: GLUCOPHAGE TAKE TWO (2) TABLETS BY MOUTH 2 TIMES DAILY WITH A MEAL   mirabegron ER 25 MG Tb24 tablet Commonly known as: MYRBETRIQ Take 1 tablet (25 mg total) by mouth daily.   mometasone 0.1 % cream Commonly known as: Elocon Apply 1 application topically daily.   pantoprazole 40 MG tablet Commonly known as: PROTONIX Take one tablet once or twice daily, 30 minutes before a meal.   pravastatin 10 MG tablet Commonly known as:  PRAVACHOL TAKE 1 TABLET (10 MG TOTAL) BY MOUTH AT BEDTIME   PROBIOTIC DAILY PO Take by mouth daily as needed.   Vitamin D3 125 MCG (5000 UT) Tabs Take 5,000 Units by mouth daily.       Allergies:  Allergies  Allergen Reactions  . Codeine Nausea And Vomiting  . Jardiance [Empagliflozin]   . Macrobid [Nitrofurantoin] Hives  . Simvastatin     headache    Family History: Family History  Problem Relation Age of Onset  . Stroke Mother   . Diabetes Mother   . Vision loss Mother        eye removed  . Cancer Mother        cancer in eye - removed  . Diabetes Brother   . Heart disease Father   . Colon cancer Sister   . Uterine cancer Paternal Grandmother   . Uterine cancer Maternal Aunt   . Pancreatic cancer Maternal Aunt     Social History:  reports that she has never smoked. She has never used smokeless tobacco. She reports that she does not drink alcohol and does not use drugs.  ROS: All other review of systems were reviewed and are negative except what is noted above in HPI  Physical Exam: BP (!) 171/81   Pulse 99   Temp 97.7 F (36.5 C)   Wt 123 lb (55.8 kg)   BMI 20.79 kg/m   Constitutional:  Alert and oriented, No acute distress. HEENT: [place] AT, moist mucus membranes.  Trachea midline, no masses. Cardiovascular: No clubbing, cyanosis, or edema. Respiratory: Normal respiratory effort, no increased work of breathing. GI: Abdomen is soft, nontender, nondistended, no abdominal masses GU: No CVA tenderness.  Lymph: No cervical or inguinal lymphadenopathy. Skin: No rashes, bruises or suspicious lesions. Neurologic: Grossly intact, no focal deficits, moving all 4 extremities. Psychiatric: Normal mood and affect.  Laboratory Data: Lab Results  Component Value Date   WBC 6.0 10/24/2019   HGB 12.5 10/24/2019   HCT 37.6 10/24/2019   MCV 93.1 10/24/2019   PLT 233 10/24/2019    Lab Results  Component Value Date   CREATININE 0.73 10/24/2019    No results  found for: PSA  No results found for: TESTOSTERONE  Lab Results  Component Value Date   HGBA1C 7.0 (H) 10/24/2019    Urinalysis    Component Value Date/Time   COLORURINE YELLOW 10/28/2019 1026   APPEARANCEUR Clear 12/16/2019 0920   LABSPEC 1.019 10/28/2019 1026   PHURINE 5.5 10/28/2019 1026   GLUCOSEU 1+ (A) 12/16/2019 0920   HGBUR NEGATIVE 10/28/2019 1026   BILIRUBINUR Negative 12/16/2019 0920   KETONESUR NEGATIVE 10/28/2019 1026   PROTEINUR Negative 12/16/2019 0920   PROTEINUR NEGATIVE 10/28/2019 1026   UROBILINOGEN 0.2 03/24/2014 1133  NITRITE Negative 12/16/2019 0920   NITRITE NEGATIVE 10/28/2019 1026   LEUKOCYTESUR Negative 12/16/2019 0920   LEUKOCYTESUR 1+ (A) 10/28/2019 1026    Lab Results  Component Value Date   LABMICR Comment 12/16/2019   BACTERIA NONE SEEN 10/28/2019    Pertinent Imaging:  No results found for this or any previous visit.  No results found for this or any previous visit.  No results found for this or any previous visit.  No results found for this or any previous visit.  No results found for this or any previous visit.  No results found for this or any previous visit.  No results found for this or any previous visit.  No results found for this or any previous visit.   Assessment & Plan:    1. Urinary retention/dysfunctional voiding -Continue mirabegron 25mg  - BLADDER SCAN AMB NON-IMAGING - Urinalysis, Routine w reflex microscopic  2. OAB with urinary urgency -Mirabegron 25mg  daily  No follow-ups on file.  Nicolette Bang, MD  [facility] [place]

## 2020-01-15 NOTE — Patient Instructions (Signed)

## 2020-01-22 ENCOUNTER — Other Ambulatory Visit: Payer: Self-pay | Admitting: Family Medicine

## 2020-01-22 DIAGNOSIS — E785 Hyperlipidemia, unspecified: Secondary | ICD-10-CM

## 2020-01-28 ENCOUNTER — Encounter: Payer: Self-pay | Admitting: Family Medicine

## 2020-01-28 ENCOUNTER — Ambulatory Visit (INDEPENDENT_AMBULATORY_CARE_PROVIDER_SITE_OTHER): Payer: Medicare Other | Admitting: Family Medicine

## 2020-01-28 ENCOUNTER — Other Ambulatory Visit: Payer: Self-pay

## 2020-01-28 VITALS — BP 110/64 | HR 84 | Temp 98.0°F | Resp 12 | Ht 64.5 in | Wt 124.0 lb

## 2020-01-28 DIAGNOSIS — J0100 Acute maxillary sinusitis, unspecified: Secondary | ICD-10-CM

## 2020-01-28 DIAGNOSIS — I10 Essential (primary) hypertension: Secondary | ICD-10-CM | POA: Diagnosis not present

## 2020-01-28 MED ORDER — LISINOPRIL 20 MG PO TABS: 20.0000 mg | ORAL_TABLET | Freq: Every day | ORAL | 3 refills | Status: DC

## 2020-01-28 MED ORDER — AMOXICILLIN 500 MG PO CAPS: 500.0000 mg | ORAL_CAPSULE | Freq: Two times a day (BID) | ORAL | 0 refills | Status: DC

## 2020-01-28 MED ORDER — FLUTICASONE PROPIONATE 50 MCG/ACT NA SUSP: 2.0000 | Freq: Every day | NASAL | 5 refills | Status: AC | PRN

## 2020-01-28 NOTE — Progress Notes (Signed)
   Subjective:    Patient ID: Jennifer Porter, female    DOB: 1945/07/23, 74 y.o.   MRN: [id number]  Patient presents for R Ear Pain (x1 week- pops and pressure in ear- no drainage)  Pt here with right ear pain, feels like a popping sound on that   she has had some sinus pressure and drainage, no fever, no sore throat Occ dry cough   Her hearing was initialy very muffled , now improved  She has been using flonase    Flu shot UTD, done last Thursday  She cant take anti-istamines make her drowsy    Review Of Systems:  GEN- denies fatigue, fever, weight loss,weakness, recent illness HEENT- denies eye drainage, change in vision, nasal discharge, CVS- denies chest pain, palpitations RESP- denies SOB, cough, wheeze ABD- denies N/V, change in stools, abd pain GU- denies dysuria, hematuria, dribbling, incontinence MSK- denies joint pain, muscle aches, injury Neuro- denies headache, dizziness, syncope, seizure activity       Objective:    BP 110/64   Pulse 84   Temp 98 F (36.7 C) (Temporal)   Resp 12   Ht 5' 4.5" (1.638 m)   Wt 124 lb (56.2 kg)   SpO2 97%   BMI 20.96 kg/m  GEN- NAD, alert and oriented x3 HEENT- PERRL, EOMI, non injected sclera, pink conjunctiva, MMM, oropharynx clear, TM Right decreased light reflex, no erythema, Left TM clear , nares enlarged turbinates with congestion, +maxillary and frontal sinus tenderness Neck- Supple, no LAD  CVS- RRR, no murmur RESP-CTAB2 Ext- no edema         Assessment & Plan:      Problem List Items Addressed This Visit    None    Visit Diagnoses    Acute non-recurrent maxillary sinusitis    -  Primary   Amoxicillin, add flonase again with saline, unable to use anti-histamines makes pt very drowsy, due to DM with pancreatitis avoid oral steroids at this time,    Relevant Medications   amoxicillin (AMOXIL) 500 MG capsule   fluticasone (FLONASE) 50 MCG/ACT nasal spray   Essential hypertension       Relevant Medications    lisinopril (ZESTRIL) 20 MG tablet      Note: This dictation was prepared with Dragon dictation along with smaller phrase technology. Any transcriptional errors that result from this process are unintentional.

## 2020-01-28 NOTE — Patient Instructions (Signed)
Use nasal saline Asencion Islam Take antibiotics as prescribed F/U as previous

## 2020-01-29 ENCOUNTER — Encounter: Payer: Self-pay | Admitting: Family Medicine

## 2020-01-29 NOTE — Assessment & Plan Note (Signed)
Controlled meds refilled

## 2020-02-06 DIAGNOSIS — Z794 Long term (current) use of insulin: Secondary | ICD-10-CM | POA: Diagnosis not present

## 2020-02-06 DIAGNOSIS — H353131 Nonexudative age-related macular degeneration, bilateral, early dry stage: Secondary | ICD-10-CM | POA: Diagnosis not present

## 2020-02-06 DIAGNOSIS — H25813 Combined forms of age-related cataract, bilateral: Secondary | ICD-10-CM | POA: Diagnosis not present

## 2020-02-06 DIAGNOSIS — E119 Type 2 diabetes mellitus without complications: Secondary | ICD-10-CM | POA: Diagnosis not present

## 2020-02-06 DIAGNOSIS — Z7984 Long term (current) use of oral hypoglycemic drugs: Secondary | ICD-10-CM | POA: Diagnosis not present

## 2020-02-24 ENCOUNTER — Other Ambulatory Visit: Payer: Self-pay

## 2020-02-24 ENCOUNTER — Other Ambulatory Visit: Payer: Medicare Other

## 2020-02-24 DIAGNOSIS — M1712 Unilateral primary osteoarthritis, left knee: Secondary | ICD-10-CM | POA: Diagnosis not present

## 2020-02-24 DIAGNOSIS — E785 Hyperlipidemia, unspecified: Secondary | ICD-10-CM | POA: Diagnosis not present

## 2020-02-24 DIAGNOSIS — I10 Essential (primary) hypertension: Secondary | ICD-10-CM | POA: Diagnosis not present

## 2020-02-24 DIAGNOSIS — E119 Type 2 diabetes mellitus without complications: Secondary | ICD-10-CM | POA: Diagnosis not present

## 2020-02-25 ENCOUNTER — Encounter: Payer: Self-pay | Admitting: Family Medicine

## 2020-02-25 ENCOUNTER — Ambulatory Visit (INDEPENDENT_AMBULATORY_CARE_PROVIDER_SITE_OTHER): Payer: Medicare Other | Admitting: Family Medicine

## 2020-02-25 VITALS — BP 142/76 | HR 87 | Temp 98.0°F | Resp 15 | Ht 64.5 in | Wt 124.4 lb

## 2020-02-25 DIAGNOSIS — E43 Unspecified severe protein-calorie malnutrition: Secondary | ICD-10-CM

## 2020-02-25 DIAGNOSIS — K861 Other chronic pancreatitis: Secondary | ICD-10-CM | POA: Diagnosis not present

## 2020-02-25 DIAGNOSIS — Z794 Long term (current) use of insulin: Secondary | ICD-10-CM

## 2020-02-25 DIAGNOSIS — J019 Acute sinusitis, unspecified: Secondary | ICD-10-CM

## 2020-02-25 DIAGNOSIS — I700 Atherosclerosis of aorta: Secondary | ICD-10-CM

## 2020-02-25 DIAGNOSIS — Z79890 Hormone replacement therapy (postmenopausal): Secondary | ICD-10-CM

## 2020-02-25 DIAGNOSIS — E1169 Type 2 diabetes mellitus with other specified complication: Secondary | ICD-10-CM | POA: Diagnosis not present

## 2020-02-25 LAB — MAGNESIUM: Magnesium: 1.4 mg/dL — ABNORMAL LOW (ref 1.5–2.5)

## 2020-02-25 LAB — CBC WITH DIFFERENTIAL/PLATELET
Absolute Monocytes: 444 cells/uL (ref 200–950)
Basophils Absolute: 82 cells/uL (ref 0–200)
Basophils Relative: 1.6 %
Eosinophils Absolute: 133 cells/uL (ref 15–500)
Eosinophils Relative: 2.6 %
HCT: 38.8 % (ref 35.0–45.0)
Hemoglobin: 13 g/dL (ref 11.7–15.5)
Lymphs Abs: 1561 cells/uL (ref 850–3900)
MCH: 31 pg (ref 27.0–33.0)
MCHC: 33.5 g/dL (ref 32.0–36.0)
MCV: 92.4 fL (ref 80.0–100.0)
MPV: 11.1 fL (ref 7.5–12.5)
Monocytes Relative: 8.7 %
Neutro Abs: 2882 cells/uL (ref 1500–7800)
Neutrophils Relative %: 56.5 %
Platelets: 276 10*3/uL (ref 140–400)
RBC: 4.2 10*6/uL (ref 3.80–5.10)
RDW: 12 % (ref 11.0–15.0)
Total Lymphocyte: 30.6 %
WBC: 5.1 10*3/uL (ref 3.8–10.8)

## 2020-02-25 LAB — COMPLETE METABOLIC PANEL WITH GFR
AG Ratio: 1.9 (calc) (ref 1.0–2.5)
ALT: 15 U/L (ref 6–29)
AST: 22 U/L (ref 10–35)
Albumin: 4.3 g/dL (ref 3.6–5.1)
Alkaline phosphatase (APISO): 28 U/L — ABNORMAL LOW (ref 37–153)
BUN/Creatinine Ratio: 32 (calc) — ABNORMAL HIGH (ref 6–22)
BUN: 29 mg/dL — ABNORMAL HIGH (ref 7–25)
CO2: 29 mmol/L (ref 20–32)
Calcium: 10 mg/dL (ref 8.6–10.4)
Chloride: 103 mmol/L (ref 98–110)
Creat: 0.91 mg/dL (ref 0.60–0.93)
GFR, Est African American: 72 mL/min/{1.73_m2} (ref >=60)
GFR, Est Non African American: 62 mL/min/{1.73_m2} (ref >=60)
Globulin: 2.3 g/dL (calc) (ref 1.9–3.7)
Glucose, Bld: 103 mg/dL — ABNORMAL HIGH (ref 65–99)
Potassium: 5.2 mmol/L (ref 3.5–5.3)
Sodium: 140 mmol/L (ref 135–146)
Total Bilirubin: 0.5 mg/dL (ref 0.2–1.2)
Total Protein: 6.6 g/dL (ref 6.1–8.1)

## 2020-02-25 LAB — LIPID PANEL
Cholesterol: 122 mg/dL (ref <200)
HDL: 49 mg/dL — ABNORMAL LOW (ref >=50)
LDL Cholesterol (Calc): 58 mg/dL (calc)
Non-HDL Cholesterol (Calc): 73 mg/dL (calc) (ref <130)
Total CHOL/HDL Ratio: 2.5 (calc) (ref <5.0)
Triglycerides: 69 mg/dL (ref <150)

## 2020-02-25 LAB — HEMOGLOBIN A1C
Hgb A1c MFr Bld: 6.6 % of total Hgb — ABNORMAL HIGH (ref <5.7)
Mean Plasma Glucose: 143 mg/dL
eAG (mmol/L): 7.9 mmol/L

## 2020-02-25 MED ORDER — ESTRADIOL 0.5 MG PO TABS
ORAL_TABLET | ORAL | 1 refills | Status: DC
Start: 2020-02-25 — End: 2020-09-08

## 2020-02-25 MED ORDER — AMOXICILLIN 500 MG PO CAPS: 500.0000 mg | ORAL_CAPSULE | Freq: Two times a day (BID) | ORAL | 0 refills | Status: DC

## 2020-02-25 MED ORDER — ESTRADIOL 0.5 MG PO TABS
ORAL_TABLET | ORAL | 1 refills | Status: DC
Start: 2020-02-25 — End: 2020-02-25

## 2020-02-25 MED ORDER — MAGNESIUM OXIDE (ELEMENTAL) 400 MG PO TABS: 1.0000 | ORAL_TABLET | Freq: Two times a day (BID) | ORAL | 3 refills | Status: AC

## 2020-02-25 NOTE — Assessment & Plan Note (Signed)
Reduce estrogen to 0.5mg  once a day  She does not have as many hot flashes any more Mammogram in Jan

## 2020-02-25 NOTE — Assessment & Plan Note (Signed)
Maintaining weight, drinks protein supplements

## 2020-02-25 NOTE — Assessment & Plan Note (Signed)
meds per GI No current abd pain Maintaining wieght

## 2020-02-25 NOTE — Assessment & Plan Note (Signed)
Lipid at goal, no change to statin drug

## 2020-02-25 NOTE — Patient Instructions (Addendum)
Levemir 8 units at bedtime - 1 week , then go back down to your 5 units  Continue metformin  Estrogen 0.5mg  new dose  Mammogram for Jan  [facility]- Dr. Garret Reddish  [facility]- Dr. Posey Pronto   F/U 4 months

## 2020-02-25 NOTE — Assessment & Plan Note (Signed)
Elevated CBG due to steroids Will have her increase levemir to 8 units at bedtime for the next week, continue metformin Then return to levemir 5 units A1C shows good control otherwise

## 2020-02-25 NOTE — Progress Notes (Signed)
Subjective:    Patient ID: Jennifer Porter, female    DOB: 1945-12-31, 74 y.o.   MRN: [id number]  Patient presents for Follow-up (pt here for 4 month follow up notes she has had some high BS readins recently and has been unable to bring them down took 5 units insulin last night)    Pt here to f/u chronic medical problems     DM - she had injection in her knee , last night  350's, this AM 290    She took Levemir 5 units   metformin 1000mg   Ha some neuropathy in feet - 1 tabet at bedtime  Recent visit with eye doctor, told cataracts may need to come off in 1-2 years     Chronic pancreatitis-she is taking her supplements and GI medications as prescribed.    OA Knee- Dr. Delfino Lovett   HTN taking bp meds, bp mildly elevated this morning, she took meds aout 1 hour ago   Low magnesium at  1.4 , she has been taing 750mg     She is on estradiol 1mg   for ongoing post menopausal symptoms    Due for mammogram in Jan   Treated for sinusitis a few weeks ago.  Her symptoms improved but then rebounded back again.  She is still using her allergy medicine nasal spray  Review Of Systems:  GEN- denies fatigue, fever, weight loss,weakness, recent illness HEENT- denies eye drainage, change in vision,+ nasal discharge, CVS- denies chest pain, palpitations RESP- denies SOB, cough, wheeze ABD- denies N/V, change in stools, abd pain GU- denies dysuria, hematuria, dribbling, incontinence MSK-+ joint pain, muscle aches, injury Neuro- denies headache, dizziness, syncope, seizure activity       Objective:    BP (!) 142/76   Pulse 87   Temp 98 F (36.7 C) (Temporal)   Resp 15   Ht 5' 4.5" (1.638 m)   Wt 124 lb 6.4 oz (56.4 kg)   SpO2 97%   BMI 21.02 kg/m  GEN- NAD, alert and oriented x3 HEENT- PERRL, EOMI, non injected sclera, pink conjunctiva, MMM, oropharynx clear, sinus tenderness nasal congestion TM clear bilaterally no effusion Neck- Supple, no thyromegaly CVS- RRR, no  murmur RESP-CTAB ABD-NABS,soft,NT,ND EXT- No edema Pulses- Radial, DP- 2+        Assessment & Plan:      Problem List Items Addressed This Visit      Unprioritized   Aortic atherosclerosis ([facility]) - Primary    Lipid at goal, no change to statin drug       Chronic pancreatitis ([facility])    meds per GI No current abd pain Maintaining wieght       Hormone replacement therapy    Reduce estrogen to 0.5mg  once a day  She does not have as many hot flashes any more Mammogram in Jan       Protein-calorie malnutrition ([place])    Maintaining weight, drinks protein supplements       Type 2 diabetes mellitus with other specified complication ([facility])    Elevated CBG due to steroids Will have her increase levemir to 8 units at bedtime for the next week, continue metformin Then return to levemir 5 units A1C shows good control otherwise        Other Visit Diagnoses    Hypomagnesemia       Mag improved but not at goal, change to 400mg  BID,    Acute rhinosinusitis       will give another round of amoxicillin,  contuinue nasal sprays   Relevant Medications   amoxicillin (AMOXIL) 500 MG capsule      Note: This dictation was prepared with Dragon dictation along with smaller phrase technology. Any transcriptional errors that result from this process are unintentional.

## 2020-03-30 ENCOUNTER — Other Ambulatory Visit: Payer: Self-pay | Admitting: Family Medicine

## 2020-03-30 DIAGNOSIS — E119 Type 2 diabetes mellitus without complications: Secondary | ICD-10-CM

## 2020-04-01 ENCOUNTER — Other Ambulatory Visit: Payer: Self-pay | Admitting: Family Medicine

## 2020-04-01 ENCOUNTER — Encounter: Payer: Self-pay | Admitting: Family Medicine

## 2020-04-01 DIAGNOSIS — Z1231 Encounter for screening mammogram for malignant neoplasm of breast: Secondary | ICD-10-CM | POA: Diagnosis not present

## 2020-04-15 ENCOUNTER — Ambulatory Visit: Payer: Medicare Other | Admitting: Urology

## 2020-04-15 ENCOUNTER — Other Ambulatory Visit: Payer: Self-pay | Admitting: Family Medicine

## 2020-04-22 ENCOUNTER — Other Ambulatory Visit: Payer: Self-pay | Admitting: Family Medicine

## 2020-04-22 DIAGNOSIS — E785 Hyperlipidemia, unspecified: Secondary | ICD-10-CM

## 2020-05-27 ENCOUNTER — Encounter: Payer: Self-pay | Admitting: Internal Medicine

## 2020-06-23 DIAGNOSIS — M1712 Unilateral primary osteoarthritis, left knee: Secondary | ICD-10-CM | POA: Diagnosis not present

## 2020-07-10 ENCOUNTER — Ambulatory Visit: Payer: Medicare Other | Admitting: Gastroenterology

## 2020-07-20 ENCOUNTER — Encounter: Payer: Self-pay | Admitting: Nurse Practitioner

## 2020-07-20 ENCOUNTER — Other Ambulatory Visit: Payer: Self-pay

## 2020-07-20 ENCOUNTER — Ambulatory Visit (INDEPENDENT_AMBULATORY_CARE_PROVIDER_SITE_OTHER): Payer: Medicare Other | Admitting: Nurse Practitioner

## 2020-07-20 VITALS — BP 128/84 | HR 79 | Temp 97.9°F | Ht 64.55 in | Wt 124.0 lb

## 2020-07-20 DIAGNOSIS — K219 Gastro-esophageal reflux disease without esophagitis: Secondary | ICD-10-CM | POA: Diagnosis not present

## 2020-07-20 DIAGNOSIS — J0141 Acute recurrent pansinusitis: Secondary | ICD-10-CM

## 2020-07-20 DIAGNOSIS — E785 Hyperlipidemia, unspecified: Secondary | ICD-10-CM | POA: Diagnosis not present

## 2020-07-20 DIAGNOSIS — Z794 Long term (current) use of insulin: Secondary | ICD-10-CM | POA: Diagnosis not present

## 2020-07-20 DIAGNOSIS — L989 Disorder of the skin and subcutaneous tissue, unspecified: Secondary | ICD-10-CM | POA: Diagnosis not present

## 2020-07-20 DIAGNOSIS — Z79890 Hormone replacement therapy (postmenopausal): Secondary | ICD-10-CM

## 2020-07-20 DIAGNOSIS — I700 Atherosclerosis of aorta: Secondary | ICD-10-CM

## 2020-07-20 DIAGNOSIS — M25562 Pain in left knee: Secondary | ICD-10-CM

## 2020-07-20 DIAGNOSIS — E1169 Type 2 diabetes mellitus with other specified complication: Secondary | ICD-10-CM | POA: Diagnosis not present

## 2020-07-20 DIAGNOSIS — E559 Vitamin D deficiency, unspecified: Secondary | ICD-10-CM | POA: Diagnosis not present

## 2020-07-20 DIAGNOSIS — K861 Other chronic pancreatitis: Secondary | ICD-10-CM

## 2020-07-20 DIAGNOSIS — G8929 Other chronic pain: Secondary | ICD-10-CM

## 2020-07-20 DIAGNOSIS — I10 Essential (primary) hypertension: Secondary | ICD-10-CM

## 2020-07-20 DIAGNOSIS — N951 Menopausal and female climacteric states: Secondary | ICD-10-CM

## 2020-07-20 MED ORDER — SALINE SPRAY 0.65 % NA SOLN: 1.0000 | NASAL | 0 refills | Status: AC | PRN

## 2020-07-20 MED ORDER — DICLOFENAC SODIUM ER 100 MG PO TB24: 100.0000 mg | ORAL_TABLET | Freq: Every day | ORAL | 0 refills | Status: DC | PRN

## 2020-07-20 MED ORDER — BD PEN NEEDLE NANO U/F 32G X 4 MM MISC: 1 refills | Status: DC

## 2020-07-20 MED ORDER — FENOFIBRIC ACID 135 MG PO CPDR: 1.0000 | DELAYED_RELEASE_CAPSULE | Freq: Every day | ORAL | 0 refills | Status: DC

## 2020-07-20 MED ORDER — PRAVASTATIN SODIUM 10 MG PO TABS: 10.0000 mg | ORAL_TABLET | Freq: Every day | ORAL | 0 refills | Status: DC

## 2020-07-20 MED ORDER — AMOXICILLIN 875 MG PO TABS: 875.0000 mg | ORAL_TABLET | Freq: Two times a day (BID) | ORAL | 0 refills | Status: AC

## 2020-07-20 MED ORDER — LEVEMIR FLEXTOUCH 100 UNIT/ML ~~LOC~~ SOPN: PEN_INJECTOR | SUBCUTANEOUS | 0 refills | Status: DC

## 2020-07-20 NOTE — Assessment & Plan Note (Addendum)
Chronic.  Patient maintains on pravastatin 10 mg daily and fenofibrate.  Recent lipid panel about 5 months ago showed LDL less than 70.  We will hold off on repeating today and repeat at follow-up in 4 months.

## 2020-07-20 NOTE — Assessment & Plan Note (Signed)
Chronic.  Maintained on fenofibrate and pravastatin 10 mg daily.  Last LDL in December 2021 was less than 70.  Continue current medication and will recheck lipids at next follow-up in 4 months.

## 2020-07-20 NOTE — Assessment & Plan Note (Signed)
Chronic.  Maintained on pantoprazole 40 mg 1-2 times daily as needed.  Follows closely with GI for acid reflux as well as chronic pancreatitis-continue this collaboration.  We will check magnesium level today and continue supplementation with magnesium oxide 400 mg twice daily for now.

## 2020-07-20 NOTE — Assessment & Plan Note (Signed)
Acute on chronic.  After review of her chart, it looks like patient has had at least 3 sinus infections in the past 6 months.  Strongly encouraged evaluation with ENT, however patient declines for now due to being the sole caregiver of her husband who is requiring daily dialysis.  Will start on amoxicillin twice daily for 7 days.  Strongly encourage patient to notify us if her symptoms do not fully resolve after antibiotics.  Also encouraged symptoms return in less than 3 months, let us know so we can set her up with an ENT provider.  Patient verbalized understanding.

## 2020-07-20 NOTE — Assessment & Plan Note (Signed)
Chronic.  Blood pressure at goal of less than 140/90 today in office.  We will continue lisinopril 20 mg daily.  Continue DASH diet.  We will check CMP today for electrolytes and kidney function.  Follow-up in 6 months.

## 2020-07-20 NOTE — Assessment & Plan Note (Signed)
Chronic.  Follows with orthopedics for knee injections.  Will refill diclofenac 100 mg daily as needed for musculoskeletal pain-she uses this extremely infrequently.  Continue collaboration with orthopedics.

## 2020-07-20 NOTE — Assessment & Plan Note (Signed)
Chronic.  Last A1c December 2021 was 6.6%, which was at goal.  Her goal is less than 7%.  She maintains on metformin 1000 mg twice daily and Levemir 5 units at bedtime and increases to 6 units at bedtime after knee injection.  We will continue this dosing for now and recheck A1c level today.   Foot exam today was normal, eye exam is up-to-date per patient.  Patient is on pravastatin most recent LDL in December 2021 less than 70.  Follow-up in 4 months.

## 2020-07-20 NOTE — Progress Notes (Signed)
Subjective:    Patient ID: Jennifer Porter, female    DOB: 12/06/1945, 75 y.o.   MRN: [id number]  HPI: Jennifer Porter is a 75 y.o. female presenting for chronic disease follow-up.  Chief Complaint  Patient presents with  . Diabetes    Has spot on face of concern, feels it may be skin cancer, and asking for antibiotic for allergy. Having face pressure   Husband is sick - she is the main caregiver; she is doing dialysis at nighttime at home 7 days per week.    DIABETES Currently taking Metformin 500 mg 2 pills in the morning and 1 pill in the evening.  Decreased dose slightly due to side effects.  She is taking Levemir 5 units at nighttime.  She does report when she gets a steroid shot in her knee, she increase his Levemir to 3 units until her blood sugars stabilize. Hypoglycemic episodes:no Polydipsia/polyuria: no Visual disturbance: no Chest pain: no Paresthesias: yes Glucose Monitoring: yes             Accucheck frequency: in the morning and at night time             Fasting glucose: less than 120              Evening: most less than 200; gets knee shots and takes 6 units when she does Taking Insulin?: yes             Long acting insulin: Levemir 5 to 6 units nightly Retinal Examination: Up to Date Foot Exam: Up to Date Diabetic Education: Completed Pneumovax: Up to Date Influenza: Up to Date Aspirin: yes  SKIN LESION Patient reports lesion on right cheek that she has noticed over the past couple months.  She states that she will put some prescription steroid cream on it and it will get better, but then it will return.  She reports there is white, flaky dry skin covering the top of it. Duration: months Location: Right cheek Painful: no Itching: no Onset: gradual Context: changing History of skin cancer: no History of precancerous skin lesions: no Family history of skin cancer: no  UPPER RESPIRATORY TRACT INFECTION Patient history of chronic sinus  infections. Onset: Worsening over past couple of weeks COVID testing history: Not tested COVID vaccination status: Vaccinated with 2 doses of Pfizer vaccine in February 2021 Recent root canal, getting replacement tooth  Fever: no Cough: yes; dry Shortness of breath: no Wheezing: no Chest pain: no Chest tightness: no Chest congestion: no Nasal congestion: yes Runny nose: yes Post nasal drip: yes Sneezing: no Sore throat: no  Swollen glands: no Sinus pressure: yes Headache: no Face pain: no Toothache: has recently been having dental work Ear pain: no  Ear pressure: yes  Eyes red/itching: yes; itchy eyes Eye drainage/crusting: no  Nausea: no  Vomiting: no Diarrhea: no  Change in appetite: no  Loss of taste/smell: no  Rash: no Fatigue: no Sick contacts: no Strep contacts: no  Context: Stable to worse Recurrent sinusitis: no Treatments attempted: Flonase Relief with OTC medications: somewhat  MENOPAUSAL SYMPTOMS History of a hysterectomy at a young age.  She was on Premarin at first, however insurance stopped covering this and since that time, has been on estradiol ever since.  Recently, estradiol was decreased from 1 mg to 0.5 mg daily.  She reports that she is having some increase in hot flashes, however she is able to tolerate them. Duration: stable Symptom severity: mild Hot flashes:  yes Night sweats: yes Sleep disturbances: no Vaginal dryness: yes Dyspareunia:n/a Hysterectomy: yes;  CHRONIC BACK PAIN Patient is requesting a refill of the diclofenac that she uses for chronic intermittent pain.  She reports a 90-day prescription usually lasts her at least 1 year.  She does not take this medication daily; only takes as needed.  CHRONIC IDIOPATHIC PANCREATITIS Duration: months-years Location: pancreas Onset: sudden Current treatment: Creon, bentyl, pantoprazole Specialty evaluation: yes - GI  Allergies  Allergen Reactions  . Codeine Nausea And Vomiting   . Jardiance [Empagliflozin]   . Macrobid [Nitrofurantoin] Hives  . Simvastatin     headache    Outpatient Encounter Medications as of 07/20/2020  Medication Sig  . albuterol (PROVENTIL HFA;VENTOLIN HFA) 108 (90 Base) MCG/ACT inhaler Inhale 2 puffs into the lungs every 6 (six) hours as needed for wheezing or shortness of breath.  Marland Kitchen amoxicillin (AMOXIL) 875 MG tablet Take 1 tablet (875 mg total) by mouth 2 (two) times daily for 7 days.  Marland Kitchen aspirin EC 81 MG tablet Take 81 mg by mouth daily.  . Cholecalciferol (VITAMIN D3) 125 MCG (5000 UT) TABS Take 5,000 Units by mouth daily.  Marland Kitchen dicyclomine (BENTYL) 10 MG capsule Take one capsule up to twice daily for abdominal cramping or loose stools.  Marland Kitchen estradiol (ESTRACE) 0.5 MG tablet TAKE ONE TABLET BY MOUTH DAILY  . fluticasone (FLONASE) 50 MCG/ACT nasal spray Place 2 sprays into both nostrils daily as needed.  . gabapentin (NEURONTIN) 300 MG capsule Take 1 capsule (300 mg total) by mouth daily.  . lipase/protease/amylase (CREON) 36000 UNITS CPEP capsule TAKE TWO CAPSULES BY MOUTH WITH MEALS AND ONE WITH SNACKS (UP TO 8 A DAY)  . lisinopril (ZESTRIL) 20 MG tablet Take 1 tablet (20 mg total) by mouth daily.  . Magnesium Oxide, Elemental, 400 MG TABS Take 1 tablet by mouth in the morning and at bedtime.  . metFORMIN (GLUCOPHAGE) 500 MG tablet TAKE TWO (2) TABLETS BY MOUTH 2 TIMES DAILY WITH A MEAL  . mirabegron ER (MYRBETRIQ) 25 MG TB24 tablet Take 1 tablet (25 mg total) by mouth daily.  . mometasone (ELOCON) 0.1 % cream Apply 1 application topically daily.  . pantoprazole (PROTONIX) 40 MG tablet Take one tablet once or twice daily, 30 minutes before a meal.  . Probiotic Product (PROBIOTIC DAILY PO) Take by mouth daily as needed.   . sodium chloride (OCEAN) 0.65 % SOLN nasal spray Place 1 spray into both nostrils as needed for congestion.  . [DISCONTINUED] amoxicillin (AMOXIL) 500 MG capsule Take 1 capsule (500 mg total) by mouth 2 (two) times daily.  .  [DISCONTINUED] BD PEN NEEDLE NANO U/F 32G X 4 MM MISC USE UP TO TWICE DAILY  . [DISCONTINUED] Choline Fenofibrate (FENOFIBRIC ACID) 135 MG CPDR TAKE 1 CAPSULE BY MOUTH EVERY NIGHT AT BEDTIME  . [DISCONTINUED] Diclofenac Sodium CR 100 MG 24 hr tablet Take 100 mg by mouth as needed.   . [DISCONTINUED] LEVEMIR FLEXTOUCH 100 UNIT/ML FlexPen INJECT 5 TO 50 UNITS INTO THE SKIN AT BEDTIME AS DIRECTED BY YOUR PROVIDER  . [DISCONTINUED] pravastatin (PRAVACHOL) 10 MG tablet TAKE 1 TABLET (10 MG TOTAL) BY MOUTH AT BEDTIME  . Choline Fenofibrate (FENOFIBRIC ACID) 135 MG CPDR Take 1 capsule by mouth at bedtime.  . Diclofenac Sodium CR 100 MG 24 hr tablet Take 1 tablet (100 mg total) by mouth daily as needed for pain.  Marland Kitchen insulin detemir (LEVEMIR FLEXTOUCH) 100 UNIT/ML FlexPen INJECT 5 TO 50 UNITS INTO THE SKIN  AT BEDTIME NIGHTLY.  . Insulin Pen Needle (BD PEN NEEDLE NANO U/F) 32G X 4 MM MISC USE UP TO TWICE DAILY  . pravastatin (PRAVACHOL) 10 MG tablet Take 1 tablet (10 mg total) by mouth at bedtime.   No facility-administered encounter medications on file as of 07/20/2020.    Patient Active Problem List   Diagnosis Date Noted  . Acute recurrent pansinusitis 07/20/2020  . Urinary retention 01/15/2020  . Urinary urgency 01/15/2020  . Abdominal cramping 08/21/2019  . Protein-calorie malnutrition ([place]) 02/18/2019  . Chronic pancreatitis ([place]) 08/24/2018  . Loose stools 08/24/2018  . Pancreatic calcification 08/22/2018  . Acute pancreatitis without infection or necrosis 08/09/2018  . Sigmoid diverticulosis 08/09/2018  . 3-vessel CAD 08/09/2018  . Aortic atherosclerosis ([place]) 08/09/2018  . Myelolipoma of right adrenal gland 08/09/2018  . Renal cyst, right 08/09/2018  . Hormone replacement therapy 01/12/2017  . Hot flashes due to menopause 01/12/2017  . Diarrhea 03/28/2016  . Vitamin D deficiency 10/03/2014  . Left knee pain 07/02/2014  . Hypertension   . Gastroesophageal reflux disease   . Type 2  diabetes mellitus with other specified complication ([place])   . Allergy   . Asthma   . H/O: gout   . Hyperlipidemia     Past Medical History:  Diagnosis Date  . Allergy    seasonal  . Asthma   . Complication of anesthesia   . Diabetes mellitus without complication ([place])   . GERD (gastroesophageal reflux disease)    hiatal hernia  . H/O: gout   . Hyperlipidemia   . Hypertension   . Pancreatitis   . PONV (postoperative nausea and vomiting)     Relevant past medical, surgical, family and social history reviewed and updated as indicated. Interim medical history since our last visit reviewed.  Review of Systems Per HPI unless specifically indicated above     Objective:    BP 128/84   Pulse 79   Temp 97.9 F (36.6 C)   Ht 5' 4.55" (1.64 m)   Wt 124 lb (56.2 kg)   SpO2 97%   BMI 20.92 kg/m   Wt Readings from Last 3 Encounters:  07/20/20 124 lb (56.2 kg)  02/25/20 124 lb 6.4 oz (56.4 kg)  01/28/20 124 lb (56.2 kg)    Physical Exam Vitals and nursing note reviewed.  Constitutional:      General: She is not in acute distress.    Appearance: Normal appearance. She is not toxic-appearing.  HENT:     Head: Normocephalic and atraumatic.     Right Ear: Tympanic membrane, ear canal and external ear normal. There is no impacted cerumen.     Left Ear: Tympanic membrane, ear canal and external ear normal. There is no impacted cerumen.     Nose: Congestion present.     Right Turbinates: Swollen.     Left Turbinates: Swollen.     Right Sinus: Maxillary sinus tenderness and frontal sinus tenderness present.     Left Sinus: Maxillary sinus tenderness and frontal sinus tenderness present.     Mouth/Throat:     Mouth: Mucous membranes are moist.     Pharynx: Oropharynx is clear. No oropharyngeal exudate or posterior oropharyngeal erythema.  Eyes:     General: No scleral icterus.    Extraocular Movements: Extraocular movements intact.  Neck:     Vascular: No carotid bruit.   Cardiovascular:     Rate and Rhythm: Normal rate and regular rhythm.     Heart sounds:  Normal heart sounds. No murmur heard.   Pulmonary:     Effort: Pulmonary effort is normal. No respiratory distress.     Breath sounds: Normal breath sounds. No wheezing, rhonchi or rales.  Abdominal:     General: Abdomen is flat. Bowel sounds are normal. There is no distension.     Palpations: Abdomen is soft. There is no mass.     Tenderness: There is no abdominal tenderness.  Musculoskeletal:        General: Normal range of motion.     Right lower leg: No edema.     Left lower leg: No edema.  Skin:    General: Skin is warm and dry.     Capillary Refill: Capillary refill takes less than 2 seconds.     Coloration: Skin is not jaundiced or pale.     Findings: Lesion present. No erythema.       Neurological:     Mental Status: She is alert and oriented to person, place, and time.     Motor: No weakness.     Gait: Gait normal.  Psychiatric:        Mood and Affect: Mood normal.        Behavior: Behavior normal.        Thought Content: Thought content normal.        Judgment: Judgment normal.     Results for orders placed or performed in visit on 07/20/20  COMPLETE METABOLIC PANEL WITH GFR  Result Value Ref Range   Glucose, Bld 88 65 - 99 mg/dL   BUN 28 (H) 7 - 25 mg/dL   Creat 0.63 0.60 - 0.93 mg/dL   GFR, Est Non African American 88 > OR = 60 mL/min/1.66m2   GFR, Est African American 102 > OR = 60 mL/min/1.89m2   BUN/Creatinine Ratio 44 (H) 6 - 22 (calc)   Sodium 141 135 - 146 mmol/L   Potassium 4.6 3.5 - 5.3 mmol/L   Chloride 104 98 - 110 mmol/L   CO2 27 20 - 32 mmol/L   Calcium 10.1 8.6 - 10.4 mg/dL   Total Protein 6.7 6.1 - 8.1 g/dL   Albumin 4.6 3.6 - 5.1 g/dL   Globulin 2.1 1.9 - 3.7 g/dL (calc)   AG Ratio 2.2 1.0 - 2.5 (calc)   Total Bilirubin 0.5 0.2 - 1.2 mg/dL   Alkaline phosphatase (APISO) 22 (L) 37 - 153 U/L   AST 25 10 - 35 U/L   ALT 20 6 - 29 U/L  Magnesium   Result Value Ref Range   Magnesium 1.4 (L) 1.5 - 2.5 mg/dL      Assessment & Plan:   Problem List Items Addressed This Visit      Cardiovascular and Mediastinum   Hypertension    Chronic.  Blood pressure at goal of less than 140/90 today in office.  We will continue lisinopril 20 mg daily.  Continue DASH diet.  We will check CMP today for electrolytes and kidney function.  Follow-up in 6 months.      Relevant Medications   pravastatin (PRAVACHOL) 10 MG tablet   Choline Fenofibrate (FENOFIBRIC ACID) 135 MG CPDR   Other Relevant Orders   COMPLETE METABOLIC PANEL WITH GFR (Completed)   Hot flashes due to menopause    Chronic.  Patient has been on longstanding hormone replacement therapy and about 5 months ago, estradiol dose was decreased from 1 mg daily to 0.5 mg daily.  She has been tolerating this dose well.  Does report some hot flashes, but reports it is something that she can work through at this time.  We will continue on estradiol 0.5 mg daily for now.  Does have a history of hysterectomy -no progesterone needed.      Relevant Medications   pravastatin (PRAVACHOL) 10 MG tablet   Choline Fenofibrate (FENOFIBRIC ACID) 135 MG CPDR   Aortic atherosclerosis (HCC)    Chronic.  Patient maintains on pravastatin 10 mg daily and fenofibrate.  Recent lipid panel about 5 months ago showed LDL less than 70.  We will hold off on repeating today and repeat at follow-up in 4 months.      Relevant Medications   pravastatin (PRAVACHOL) 10 MG tablet   Choline Fenofibrate (FENOFIBRIC ACID) 135 MG CPDR     Respiratory   Acute recurrent pansinusitis    Acute on chronic.  After review of her chart, it looks like patient has had at least 3 sinus infections in the past 6 months.  Strongly encouraged evaluation with ENT, however patient declines for now due to being the sole caregiver of her husband who is requiring daily dialysis.  Will start on amoxicillin twice daily for 7 days.  Strongly  encourage patient to notify us if her symptoms do not fully resolve after antibiotics.  Also encouraged symptoms return in less than 3 months, let us know so we can set her up with an ENT provider.  Patient verbalized understanding.      Relevant Medications   sodium chloride (OCEAN) 0.65 % SOLN nasal spray   amoxicillin (AMOXIL) 875 MG tablet     Digestive   Gastroesophageal reflux disease    Chronic.  Maintained on pantoprazole 40 mg 1-2 times daily as needed.  Follows closely with GI for acid reflux as well as chronic pancreatitis-continue this collaboration.  We will check magnesium level today and continue supplementation with magnesium oxide 400 mg twice daily for now.      Relevant Orders   Magnesium (Completed)   Chronic pancreatitis (HCC)    Chronic.  Stable on Creon per gastroenterology.  Continue this collaboration.  We will check CMP today to check pancreas and liver and electrolytes.        Endocrine   Type 2 diabetes mellitus with other specified complication (HCC) - Primary    Chronic.  Last A1c December 2021 was 6.6%, which was at goal.  Her goal is less than 7%.  She maintains on metformin 1000 mg twice daily and Levemir 5 units at bedtime and increases to 6 units at bedtime after knee injection.  We will continue this dosing for now and recheck A1c level today.   Foot exam today was normal, eye exam is up-to-date per patient.  Patient is on pravastatin most recent LDL in December 2021 less than 70.  Follow-up in 4 months.      Relevant Medications   insulin detemir (LEVEMIR FLEXTOUCH) 100 UNIT/ML FlexPen   pravastatin (PRAVACHOL) 10 MG tablet   Insulin Pen Needle (BD PEN NEEDLE NANO U/F) 32G X 4 MM MISC   Other Relevant Orders   Hemoglobin A1c     Other   Vitamin D deficiency    Chronic.  Currently taking the 5000 units daily.  We will recheck vitamin D level today and continue supplementation.  We will also check CMP today to check potassium level.  Follow-up in 6  months and consider DEXA scan.      Relevant Orders   VITAMIN D 25  Hydroxy (Vit-D Deficiency, Fractures)   Left knee pain    Chronic.  Follows with orthopedics for knee injections.  Will refill diclofenac 100 mg daily as needed for musculoskeletal pain-she uses this extremely infrequently.  Continue collaboration with orthopedics.      Relevant Medications   Diclofenac Sodium CR 100 MG 24 hr tablet   Hyperlipidemia    Chronic.  Maintained on fenofibrate and pravastatin 10 mg daily.  Last LDL in December 2021 was less than 70.  Continue current medication and will recheck lipids at next follow-up in 4 months.      Relevant Medications   pravastatin (PRAVACHOL) 10 MG tablet   Choline Fenofibrate (FENOFIBRIC ACID) 135 MG CPDR   Hormone replacement therapy    Chronic.  We discussed risk of continuing hormone replacement therapy including cardiovascular disease, risk of blood clots risk, risk of breast cancer and uterine cancer. She is aware of risk and would prefer to proceed with continuing the medication.  Benefits outweigh risk.  Without the medication, she has hot flashes.  At last visit in December, estradiol dose was decreased from 1 mg daily to 0.5 mg daily.  We will continue on 0.5 mg daily and consider weaning down at next visit.  Follow-up in 4 months.        Other Visit Diagnoses    Skin lesion       Acute.  Uncomfortable biopsying lesion on face today due to location.  Referral placed to dermatology for further evaluation and management of lesion.       Follow up plan: Return in about 4 months (around 11/20/2020) for f/u - DM, HLD, HTN.

## 2020-07-20 NOTE — Assessment & Plan Note (Signed)
Chronic.  Patient has been on longstanding hormone replacement therapy and about 5 months ago, estradiol dose was decreased from 1 mg daily to 0.5 mg daily.  She has been tolerating this dose well.  Does report some hot flashes, but reports it is something that she can work through at this time.  We will continue on estradiol 0.5 mg daily for now.  Does have a history of hysterectomy -no progesterone needed.

## 2020-07-20 NOTE — Assessment & Plan Note (Signed)
Chronic.  We discussed risk of continuing hormone replacement therapy including cardiovascular disease, risk of blood clots risk, risk of breast cancer and uterine cancer. She is aware of risk and would prefer to proceed with continuing the medication.  Benefits outweigh risk.  Without the medication, she has hot flashes.  At last visit in December, estradiol dose was decreased from 1 mg daily to 0.5 mg daily.  We will continue on 0.5 mg daily and consider weaning down at next visit.  Follow-up in 4 months.

## 2020-07-20 NOTE — Patient Instructions (Signed)
I will send your refills in today  We will let you know about blood test results later this week.  Will refer to Dermatology for the skin lesion.  Let us know when you are ready for referral to the ear nose and throat doctor.  We will see you in 4 months.  Please let me know if you need anything in the meantime.  Nice meeting you! - Janett Billow

## 2020-07-20 NOTE — Assessment & Plan Note (Signed)
Chronic.  Stable on Creon per gastroenterology.  Continue this collaboration.  We will check CMP today to check pancreas and liver and electrolytes.

## 2020-07-20 NOTE — Assessment & Plan Note (Signed)
Chronic.  Currently taking the 5000 units daily.  We will recheck vitamin D level today and continue supplementation.  We will also check CMP today to check potassium level.  Follow-up in 6 months and consider DEXA scan.

## 2020-07-21 LAB — COMPLETE METABOLIC PANEL WITH GFR
AG Ratio: 2.2 (calc) (ref 1.0–2.5)
ALT: 20 U/L (ref 6–29)
AST: 25 U/L (ref 10–35)
Albumin: 4.6 g/dL (ref 3.6–5.1)
Alkaline phosphatase (APISO): 22 U/L — ABNORMAL LOW (ref 37–153)
BUN/Creatinine Ratio: 44 (calc) — ABNORMAL HIGH (ref 6–22)
BUN: 28 mg/dL — ABNORMAL HIGH (ref 7–25)
CO2: 27 mmol/L (ref 20–32)
Calcium: 10.1 mg/dL (ref 8.6–10.4)
Chloride: 104 mmol/L (ref 98–110)
Creat: 0.63 mg/dL (ref 0.60–0.93)
GFR, Est African American: 102 mL/min/{1.73_m2} (ref >=60)
GFR, Est Non African American: 88 mL/min/{1.73_m2} (ref >=60)
Globulin: 2.1 g/dL (calc) (ref 1.9–3.7)
Glucose, Bld: 88 mg/dL (ref 65–99)
Potassium: 4.6 mmol/L (ref 3.5–5.3)
Sodium: 141 mmol/L (ref 135–146)
Total Bilirubin: 0.5 mg/dL (ref 0.2–1.2)
Total Protein: 6.7 g/dL (ref 6.1–8.1)

## 2020-07-21 LAB — MAGNESIUM: Magnesium: 1.4 mg/dL — ABNORMAL LOW (ref 1.5–2.5)

## 2020-07-21 LAB — HEMOGLOBIN A1C
Hgb A1c MFr Bld: 6.5 % of total Hgb — ABNORMAL HIGH (ref <5.7)
Mean Plasma Glucose: 140 mg/dL
eAG (mmol/L): 7.7 mmol/L

## 2020-07-21 LAB — VITAMIN D 25 HYDROXY (VIT D DEFICIENCY, FRACTURES): Vit D, 25-Hydroxy: 47 ng/mL (ref 30–100)

## 2020-07-27 NOTE — Progress Notes (Signed)
Primary Care Physician:  Eulogio Bear, NP  Primary GI: Dr. Gala Romney  Patient Location: Home   Provider Location: [facility] office   Reason for Visit: Follow-up   Persons present on the virtual encounter, with roles: Patient and NP   Total time (minutes) spent on medical discussion: 10 minutes   Due to COVID-19, visit was conducted using virtual method.  Visit was requested by patient.  Virtual Visit via Telephone Note Due to COVID-19, visit is conducted virtually and was requested by patient.   I connected with [name] on 07/28/20 at  1:30 PM EDT by telephone and verified that I am speaking with the correct person using two identifiers.   I discussed the limitations, risks, security and privacy concerns of performing an evaluation and management service by telephone and the availability of in person appointments. I also discussed with the patient that there may be a patient responsible charge related to this service. The patient expressed understanding and agreed to proceed.  Chief Complaint  Patient presents with  . Diarrhea    Had bad attack on Sunday night but seems to be okay for today  . Pancreatitis    F/u. No abd pain currently, no nausea, no vomiting     History of Present Illness: Jennifer Porter 75 year old female with history of chronic pancreatitis based on EUS/MRCP findings in 2020.  Evidence of exocrine/endocrine deficiency.  Currently on Creon 72,000 units with meals, 36,000 units with snacks. Etiology of chronic pancreatitis unknown. No FH of pancreatitis. No etoh history. EUS without evidence of biliary etiology, gallbladder has been removed. No pancreatic duct stone on MRCP.     February 2021: 130 pounds April 2021: 126 pounds June 2021: 127.2 pounds December 16, 2019: 123 pounds Oct 2021 : 127 pounds Jul 28, 2020: 123 lbs   Colonoscopy about 12 years ago in [place]. No polyps. Not interested in a colonoscopy at this point.   Had bad  spell of abdominal pain over the weekend but improved now. She can't pinpoint any triggers. Had chicken pot pie and a salad. Had otherwise been doing really well. Watches what she eats. Takes 2 Creon and 1 with snacks. Usually stays between 120 and 125. GERD: takes Protonix once daily.    Past Medical History:  Diagnosis Date  . Allergy    seasonal  . Asthma   . Complication of anesthesia   . Diabetes mellitus without complication ([place])   . GERD (gastroesophageal reflux disease)    hiatal hernia  . H/O: gout   . Hyperlipidemia   . Hypertension   . Pancreatitis   . PONV (postoperative nausea and vomiting)      Past Surgical History:  Procedure Laterality Date  . ABDOMINAL HYSTERECTOMY    . APPENDECTOMY    . Arthroscopic Left Knee Left 03/22/2003  . CHOLECYSTECTOMY     in 1990s  . ESOPHAGOGASTRODUODENOSCOPY (EGD) WITH PROPOFOL N/A 11/08/2018   Procedure: ESOPHAGOGASTRODUODENOSCOPY (EGD) WITH PROPOFOL;  Surgeon: [name], MD;  Location: [facility];  Service: Endoscopy;  Laterality: N/A;  . EUS N/A 11/08/2018   Procedure: UPPER ENDOSCOPIC ULTRASOUND (EUS) RADIAL;  Surgeon: [name], MD;  Location: [facility];  Service: Endoscopy;  Laterality: N/A;     Current Meds  Medication Sig  . albuterol (PROVENTIL HFA;VENTOLIN HFA) 108 (90 Base) MCG/ACT inhaler Inhale 2 puffs into the lungs every 6 (six) hours as needed for wheezing or shortness of breath.  Marland Kitchen aspirin EC  81 MG tablet Take 81 mg by mouth daily.  . Cholecalciferol (VITAMIN D3) 125 MCG (5000 UT) TABS Take 5,000 Units by mouth daily.  . Choline Fenofibrate (FENOFIBRIC ACID) 135 MG CPDR Take 1 capsule by mouth at bedtime.  . Diclofenac Sodium CR 100 MG 24 hr tablet Take 1 tablet (100 mg total) by mouth daily as needed for pain.  Marland Kitchen dicyclomine (BENTYL) 10 MG capsule Take one capsule up to twice daily for abdominal cramping or loose stools.  Marland Kitchen estradiol (ESTRACE) 0.5 MG tablet TAKE ONE TABLET BY MOUTH DAILY  .  fluticasone (FLONASE) 50 MCG/ACT nasal spray Place 2 sprays into both nostrils daily as needed.  . gabapentin (NEURONTIN) 300 MG capsule Take 1 capsule (300 mg total) by mouth daily.  . insulin detemir (LEVEMIR FLEXTOUCH) 100 UNIT/ML FlexPen INJECT 5 TO 50 UNITS INTO THE SKIN AT BEDTIME NIGHTLY.  . Insulin Pen Needle (BD PEN NEEDLE NANO U/F) 32G X 4 MM MISC USE UP TO TWICE DAILY  . lipase/protease/amylase (CREON) 36000 UNITS CPEP capsule TAKE TWO CAPSULES BY MOUTH WITH MEALS AND ONE WITH SNACKS (UP TO 8 A DAY)  . lisinopril (ZESTRIL) 20 MG tablet Take 1 tablet (20 mg total) by mouth daily.  . Magnesium Oxide, Elemental, 400 MG TABS Take 1 tablet by mouth in the morning and at bedtime.  . metFORMIN (GLUCOPHAGE) 500 MG tablet TAKE TWO (2) TABLETS BY MOUTH 2 TIMES DAILY WITH A MEAL (Patient taking differently: 1000mg  in the AM and 500mg  in evening)  . mirabegron ER (MYRBETRIQ) 25 MG TB24 tablet Take 1 tablet (25 mg total) by mouth daily.  . mometasone (ELOCON) 0.1 % cream Apply 1 application topically daily.  . pantoprazole (PROTONIX) 40 MG tablet Take one tablet once or twice daily, 30 minutes before a meal. (Patient taking differently: Take 40 mg by mouth daily. Take one tablet once or twice daily, 30 minutes before a meal.)  . pravastatin (PRAVACHOL) 10 MG tablet Take 1 tablet (10 mg total) by mouth at bedtime.  . Probiotic Product (PROBIOTIC DAILY PO) Take by mouth daily as needed.   . sodium chloride (OCEAN) 0.65 % SOLN nasal spray Place 1 spray into both nostrils as needed for congestion.     Family History  Problem Relation Age of Onset  . Stroke Mother   . Diabetes Mother   . Vision loss Mother        eye removed  . Cancer Mother        cancer in eye - removed  . Diabetes Brother   . Heart disease Father   . Colon cancer Sister   . Uterine cancer Paternal Grandmother   . Uterine cancer Maternal Aunt   . Pancreatic cancer Maternal Aunt     Social History   Socioeconomic History   . Marital status: Married    Spouse name: Not on file  . Number of children: 1  . Years of education: Not on file  . Highest education level: Not on file  Occupational History  . Occupation: retired Pharmacist, hospital  Tobacco Use  . Smoking status: Never Smoker  . Smokeless tobacco: Never Used  Vaping Use  . Vaping Use: Never used  Substance and Sexual Activity  . Alcohol use: No  . Drug use: No  . Sexual activity: Not on file  Other Topics Concern  . Not on file  Social History Narrative   Retired March 2014.   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 Denies fatigue, weakness, weight loss.  CV: Denies chest pain, palpitations, syncope, peripheral edema, and claudication. Resp: Denies dyspnea at rest, cough, wheezing, coughing up blood, and pleurisy. GI: see HPI Derm: Denies rash, itching, dry skin Psych: Denies depression, anxiety, memory loss, confusion. No homicidal or suicidal ideation.  Heme: Denies bruising, bleeding, and enlarged lymph nodes.  Observations/Objective: No distress. Unable to perform physical exam due to telephone encounter. No video available.   Assessment and Plan: 75 year old female with history of chronic idiopathic pancreatitis based on EUS/MRCP findings in 2020, GERD, returning for follow-up.  Spell of abdominal pain over weekend self-limiting. Continues with Creon 2 capsules with meals and 1 with snacks. GERD controlled with Protonix daily. Weight overall stable.   Last colonoscopy over 12 years ago and declining screening at this time.  As she is doing well, will see her back in 1 year or sooner if needed.    Follow Up Instructions:    I discussed the  assessment and treatment plan with the patient. The patient was provided an opportunity to ask questions and all were answered. The patient agreed with the plan and demonstrated an understanding of the instructions.   The patient was advised to call back or seek an in-person evaluation if the symptoms worsen or if the condition fails to improve as anticipated.  I provided 10 minutes of time during this telephone encounter.  Annitta Needs, PhD, ANP-BC [facility]

## 2020-07-28 ENCOUNTER — Telehealth: Payer: Self-pay | Admitting: *Deleted

## 2020-07-28 ENCOUNTER — Telehealth: Payer: Self-pay | Admitting: Gastroenterology

## 2020-07-28 ENCOUNTER — Ambulatory Visit (INDEPENDENT_AMBULATORY_CARE_PROVIDER_SITE_OTHER): Payer: Medicare Other | Admitting: Gastroenterology

## 2020-07-28 ENCOUNTER — Encounter: Payer: Self-pay | Admitting: Gastroenterology

## 2020-07-28 DIAGNOSIS — K861 Other chronic pancreatitis: Secondary | ICD-10-CM | POA: Diagnosis not present

## 2020-07-28 NOTE — Patient Instructions (Addendum)
We will see you back in 1 year or sooner!!  Continue Creon 2 capsules with meals and 1 with snacks!!   It was a pleasure to talk to you today. I want to create trusting relationships with patients to provide genuine, compassionate, and quality care. I value your feedback. If you receive a survey regarding your visit,  I greatly appreciate you taking time to fill this out.   Annitta Needs, PhD, ANP-BC [facility]

## 2020-07-28 NOTE — Telephone Encounter (Signed)
Jennifer Porter, you are scheduled for a virtual visit with your provider today.  Just as we do with appointments in the office, we must obtain your consent to participate.  Your consent will be active for this visit and any virtual visit you may have with one of our providers in the next 365 days.  If you have a MyChart account, I can also send a copy of this consent to you electronically.  All virtual visits are billed to your insurance company just like a traditional visit in the office.  As this is a virtual visit, video technology does not allow for your provider to perform a traditional examination.  This may limit your provider's ability to fully assess your condition.  If your provider identifies any concerns that need to be evaluated in person or the need to arrange testing such as labs, EKG, etc, we will make arrangements to do so.  Although advances in technology are sophisticated, we cannot ensure that it will always work on either your end or our end.  If the connection with a video visit is poor, we may have to switch to a telephone visit.  With either a video or telephone visit, we are not always able to ensure that we have a secure connection.   I need to obtain your verbal consent now.   Are you willing to proceed with your visit today?

## 2020-07-28 NOTE — Telephone Encounter (Signed)
Pt consented to a telephone visit. °

## 2020-07-28 NOTE — Telephone Encounter (Signed)
Jennifer Porter, please arrange routine follow-up for chronic pancreatitis in 1 year.   Thanks!

## 2020-07-31 NOTE — Telephone Encounter (Signed)
On recall  °

## 2020-08-03 NOTE — Addendum Note (Signed)
Addended by: Amalia Hailey on: 08/03/2020 09:19 AM   Modules accepted: Orders

## 2020-08-06 ENCOUNTER — Telehealth: Payer: Self-pay | Admitting: Nurse Practitioner

## 2020-08-06 NOTE — Telephone Encounter (Signed)
error 

## 2020-09-07 ENCOUNTER — Other Ambulatory Visit: Payer: Self-pay

## 2020-09-07 DIAGNOSIS — E119 Type 2 diabetes mellitus without complications: Secondary | ICD-10-CM

## 2020-09-08 ENCOUNTER — Other Ambulatory Visit: Payer: Self-pay

## 2020-09-08 DIAGNOSIS — E119 Type 2 diabetes mellitus without complications: Secondary | ICD-10-CM

## 2020-09-08 MED ORDER — METFORMIN HCL 500 MG PO TABS: 1000.0000 mg | ORAL_TABLET | Freq: Two times a day (BID) | ORAL | 3 refills | Status: DC

## 2020-09-08 MED ORDER — ESTRADIOL 0.5 MG PO TABS: ORAL_TABLET | ORAL | 3 refills | Status: AC

## 2020-09-14 ENCOUNTER — Telehealth: Payer: Self-pay | Admitting: *Deleted

## 2020-09-14 NOTE — Chronic Care Management (AMB) (Signed)
  Chronic Care Management   Note  09/14/2020 Name: Jennifer Porter MRN: [id number] DOB: 05-Nov-1945  Jennifer Porter is a 75 y.o. year old female who is a primary care patient of Eulogio Bear, NP. I reached out to Jennifer Porter by phone today in response to a referral sent by Ms. Jennifer Porter [place]'s PCP, Eulogio Bear, NP.      [name] was given information about Chronic Care Management services today including:  CCM service includes personalized support from designated clinical staff supervised by her physician, including individualized plan of care and coordination with other care providers 24/7 contact phone numbers for assistance for urgent and routine care needs. Service will only be billed when office clinical staff spend 20 minutes or more in a month to coordinate care. Only one practitioner may furnish and bill the service in a calendar month. The patient may stop CCM services at any time (effective at the end of the month) by phone call to the office staff. The patient will be responsible for cost sharing (co-pay) of up to 20% of the service fee (after annual deductible is met).  Patient agreed to services and verbal consent obtained.   Follow up plan: Telephone appointment with care management team member scheduled for:09/28/2020  [name]

## 2020-09-15 ENCOUNTER — Other Ambulatory Visit: Payer: Self-pay

## 2020-09-28 ENCOUNTER — Ambulatory Visit (INDEPENDENT_AMBULATORY_CARE_PROVIDER_SITE_OTHER): Payer: Medicare Other | Admitting: *Deleted

## 2020-09-28 DIAGNOSIS — E119 Type 2 diabetes mellitus without complications: Secondary | ICD-10-CM

## 2020-09-28 DIAGNOSIS — K861 Other chronic pancreatitis: Secondary | ICD-10-CM

## 2020-09-28 NOTE — Patient Instructions (Signed)
Visit Information   PATIENT GOALS:   Goals Addressed             This Visit's Progress    Maintain My Quality of Life r/t Chronic Pancreatitis       Timeframe:  Long-Range Goal Priority:  High Start Date:     09/28/2020                        Expected End Date:     03/31/2021                  Follow Up Date- 11/09/2020   - complete a HCPOA and living will- information mailed to you - discuss my treatment options with the doctor or nurse - do one enjoyable thing every day - spend time outdoors at least 3 times a week, continue walking and lifting hand weights - call your primary or GI doctor if not feeling well or symptoms flare up - avoid spicy and fried foods - take all medication as prescribed   Why is this important?   Having a long-term illness can be scary.  It can also be stressful for you and your caregiver.  These steps may help.    Notes:       Monitor and Manage My Blood Sugar-Diabetes Type 2       Timeframe:  Long-Range Goal Priority:  Medium Start Date:        09/28/2020                     Expected End Date:     03/31/2021                  Follow Up Date- 11/09/2020   - check blood sugar at prescribed times - check blood sugar if I feel it is too high or too low - enter blood sugar readings and medication or insulin into daily log - take the blood sugar log to all doctor visits - take the blood sugar meter to all doctor visits - be mindful of carbohydrate intake with foods such as bread, pasta, rice and potatoes as these can elevate blood sugar     Why is this important?   Checking your blood sugar at home helps to keep it from getting very high or very low.  Writing the results in a diary or log helps the doctor know how to care for you.  Your blood sugar log should have the time, date and the results.  Also, write down the amount of insulin or other medicine that you take.  Other information, like what you ate, exercise done and how you were feeling,  will also be helpful.     Notes:          Consent to CCM Services: Jennifer Porter was given information about Chronic Care Management services today including:  CCM service includes personalized support from designated clinical staff supervised by her physician, including individualized plan of care and coordination with other care providers 24/7 contact phone numbers for assistance for urgent and routine care needs. Service will only be billed when office clinical staff spend 20 minutes or more in a month to coordinate care. Only one practitioner may furnish and bill the service in a calendar month. The patient may stop CCM services at any time (effective at the end of the month) by phone call to the office staff. The patient will be responsible for cost sharing (  co-pay) of up to 20% of the service fee (after annual deductible is met).  Patient agreed to services and verbal consent obtained.   The patient verbalized understanding of instructions, educational materials, and care plan provided today and agreed to receive a mailed copy of patient instructions, educational materials, and care plan.   Telephone follow up appointment with care management team member scheduled for:  8/22/2022Pancreatitis Eating Plan Pancreatitis is when your pancreas becomes irritated and swollen (inflamed). The pancreas is a small organ located behind your stomach. It helps your body digest food and regulate your blood sugar. Pancreatitis can affect how your body digests food, especially foods with fat. You may also have othersymptoms such as abdominal pain or nausea. When you have pancreatitis, following a low-fat eating plan may help you manage symptoms and recover more quickly. Work with your health care provider or a diet and nutrition specialist (dietitian) to create an eating plan that is right for you. What are tips for following this plan? Reading food labels Use the information on food labels to help keep  track of how much fat you eat: Check the serving size. Look for the amount of total fat in grams (g) in one serving. Low-fat foods have 3 g of fat or less per serving. Fat-free foods have 0.5 g of fat or less per serving. Keep track of how much fat you eat based on how many servings you eat. For example, if you eat two servings, the amount of fat you eat will be two times what is listed on the label. Shopping  Buy low-fat or nonfat foods, such as: Fresh, frozen, or canned fruits and vegetables. Grains, including pasta, bread, and rice. Lean meat, poultry, fish, and other protein foods. Low-fat or nonfat dairy. Avoid buying bakery products and other sweets made with whole milk, butter, and eggs. Avoid buying snack foods with added fat, such as anything with butter or cheese flavoring.  Cooking Remove skin from poultry, and remove extra fat from meat. Limit the amount of fat and oil you use to 6 teaspoons or less per day. Cook using low-fat methods, such as boiling, broiling, grilling, steaming, or baking. Use spray oil to cook. Add fat-free chicken broth to add flavor and moisture. Avoid adding cream to thicken soups or sauces. Use other thickeners such as corn starch or tomato paste. Meal planning  Eat a low-fat diet as told by your dietitian. For most people, this means having no more than 55-65 grams of fat each day. Eat small, frequent meals throughout the day. For example, you may have 5-6 small meals instead of 3 large meals. Drink enough fluid to keep your urine pale yellow. Do not drink alcohol. Talk to your health care provider if you need help stopping. Limit how much caffeine you have, including black coffee, black and green tea, caffeinated soft drinks, and energy drinks.  General information Let your health care provider or dietitian know if you have unplanned weight loss on this eating plan. You may be instructed to follow a clear liquid diet during a flare of symptoms.  Talk with your health care provider about how to manage your diet during symptoms of a flare. Take any vitamins or supplements as told by your health care provider. Work with a Microbiologist, especially if you have other conditions such as obesity or diabetes mellitus. What foods should I avoid? Fruits Fried fruits. Fruits served with butter or cream. Vegetables Fried vegetables. Vegetables cooked with butter, cheese, or cream.  Grains Biscuits, waffles, donuts, pastries, and croissants. Pies and cookies.Butter-flavored popcorn. Regular crackers. Meats and other protein foods Fatty cuts of meat. Poultry with skin. Organ meats. Bacon, sausage, and coldcuts. Whole eggs. Nuts and nut butters. Dairy Whole and 2% milk. Whole milk yogurt. Whole milk ice cream. Cream andhalf-and-half. Cream cheese. Sour cream. Cheese. Beverages Wine, beer, and liquor. The items listed above may not be a complete list of foods and beverages to avoid. Contact a dietitian for more information. Summary Pancreatitis can affect how your body digests food, especially foods with fat. When you have pancreatitis, it is recommended that you follow a low-fat eating plan to help you recover more quickly and manage symptoms. For most people, this means limiting fat to no more than 55-65 grams per day. Do not drink alcohol. Limit the amount of caffeine you have, and drink enough fluid to keep your urine pale yellow. This information is not intended to replace advice given to you by your health care provider. Make sure you discuss any questions you have with your healthcare provider. Document Revised: 06/28/2018 Document Reviewed: 06/13/2017 Elsevier Patient Education  2022 [place]. Hypoglycemia Hypoglycemia is when the sugar (glucose) level in your blood is too low. Low blood sugar can happen to people who have diabetes and people who do not have diabetes. Low blood sugar can happenquickly, and it can be an emergency. What are  the causes? This condition happens most often in people who have diabetes. It may be caused by: Diabetes medicine. Not eating enough, or not eating often enough. Doing more physical activity. Drinking alcohol on an empty stomach. If you do not have diabetes, this condition may be caused by: A tumor in the pancreas. Not eating enough, or not eating for long periods at a time (fasting). A very bad infection or illness. Problems after having weight loss (bariatric) surgery. Kidney failure or liver failure. Certain medicines. What increases the risk? This condition is more likely to develop in people who: Have diabetes and take medicines to lower their blood sugar. Abuse alcohol. Have a very bad illness. What are the signs or symptoms? Mild Hunger. Sweating and feeling clammy. Feeling dizzy or light-headed. Being sleepy or having trouble sleeping. Feeling like you may vomit (nauseous). A fast heartbeat. A headache. Blurry vision. Mood changes, such as: Being grouchy. Feeling worried or nervous (anxious). Tingling or loss of feeling (numbness) around your mouth, lips, or tongue. Moderate Confusion and poor judgment. Behavior changes. Weakness. Uneven heartbeat. Trouble with moving (coordination). Very low Very low blood sugar (severe hypoglycemia) is a medical emergency. It can cause: Fainting. Seizures. Loss of consciousness (coma). Death. How is this treated? Treating low blood sugar Low blood sugar is often treated by eating or drinking something that has sugar in it right away. The food or drink should contain 15 grams of a fast-acting carb (carbohydrate). Options include: 4 oz (120 mL) of fruit juice. 4 oz (120 mL) of regular soda (not diet soda). A few pieces of hard candy. Check food labels to see how many pieces to eat for 15 grams. 1 Tbsp (15 mL) of sugar or honey. 4 glucose tablets. 1 tube of glucose gel. Treating low blood sugar if you have diabetes If you  can think clearly and swallow safely, follow the 15:15 rule: Take 15 grams of a fast-acting carb. Talk with your doctor about how much you should take. Always keep a source of fast-acting carb with you, such as: Glucose tablets (take 4 tablets).  A few pieces of hard candy. Check food labels to see how many pieces to eat for 15 grams. 4 oz (120 mL) of fruit juice. 4 oz (120 mL) of regular soda (not diet soda). 1 Tbsp (15 mL) of honey or sugar. 1 tube of glucose gel. Check your blood sugar 15 minutes after you take the carb. If your blood sugar is still at or below 70 mg/dL (3.9 mmol/L), take 15 grams of a carb again. If your blood sugar does not go above 70 mg/dL (3.9 mmol/L) after 3 tries, get help right away. After your blood sugar goes back to normal, eat a meal or a snack within 1 hour.  Treating very low blood sugar If your blood sugar is below 54 mg/dL (3 mmol/L), you have very low blood sugar, or severe hypoglycemia. This is an emergency. Get medical help right away. If you have very low blood sugar and you cannot eat or drink, you will need to be given a hormone called glucagon. A family member or friend should learn how to check your blood sugar and how to give you glucagon. Ask your doctor if youneed to have an emergency glucagon kit at home. Very low blood sugar may also need to be treated in a hospital. Follow these instructions at home: General instructions Take over-the-counter and prescription medicines only as told by your doctor. Stay aware of your blood sugar as told by your doctor. If you drink alcohol: Limit how much you have to: 0-1 drink a day for women who are not pregnant. 0-2 drinks a day for men. Know how much alcohol is in your drink. In the [place] one drink equals one 12 oz bottle of beer (355 mL), one 5 oz glass of wine (148 mL), or one 1 oz glass of hard liquor (44 mL). Be sure to eat food when you drink alcohol. Know that your body absorbs alcohol quickly.  This may lead to low blood sugar later. Be sure to keep checking your blood sugar. Keep all follow-up visits. If you have diabetes:  Always have a fast-acting carb (15 grams) with you to treat low blood sugar. Follow your diabetes care plan as told by your doctor. Make sure you: Know the symptoms of low blood sugar. Check your blood sugar as often as told. Always check it before and after exercise. Always check your blood sugar before you drive. Take your medicines as told. Follow your meal plan. Eat on time. Do not skip meals. Share your diabetes care plan with: Your work or school. People you live with. Carry a card or wear jewelry that says you have diabetes.  Where to find more information American Diabetes Association: www.diabetes.org Contact a doctor if: You have trouble keeping your blood sugar in your target range. You have low blood sugar often. Get help right away if: You still have symptoms after you eat or drink something that contains 15 grams of fast-acting carb, and you cannot get your blood sugar above 70 mg/dL by following the 15:15 rule. Your blood sugar is below 54 mg/dL (3 mmol/L). You have a seizure. You faint. These symptoms may be an emergency. Get help right away. Call your local emergency services (911 in the [place] Do not wait to see if the symptoms will go away. Do not drive yourself to the hospital. Summary Hypoglycemia happens when the level of sugar (glucose) in your blood is too low. Low blood sugar can happen to people who have diabetes and people  who do not have diabetes. Low blood sugar can happen quickly, and it can be an emergency. Make sure you know the symptoms of low blood sugar and know how to treat it. Always keep a source of sugar (fast-acting carb) with you to treat low blood sugar. This information is not intended to replace advice given to you by your health care provider. Make sure you discuss any questions you have with your  healthcare provider. Document Revised: 02/06/2020 Document Reviewed: 02/06/2020 Elsevier Patient Education  2022 [place] [facility], BSN RN Case Manager [facility] [phone]   CLINICAL CARE PLAN: Patient Care Plan: Chronic Pancreatitis     Problem Identified: Health Promotion or Disease Self-Management - Chronic Pancreatitis   Priority: High     Long-Range Goal: Self-Management Plan Developed for Chronic Pancreatitis   Start Date: 09/28/2020  Expected End Date: 03/31/2021  This Visit's Progress: On track  Priority: High  Note:   Current Barriers:  Ineffective Self Health Maintenance in a patient with Chronic Pancreatitis- pt needs reinforcement of management of chronic pancreatitis for optimal quality of life.  Pt reports she lives with spouse (and he has health issues as well), she is independent with ADL/ IADL's, continues to drive, leaves home for church, to meet with friends and other activities as often as she can, walks at times and lifts light hand weights at times.  Pt reports her blood pressure is under good control and pt checks BP on occasion.  Pt reports living with pancreatitis can be difficult at times as "you never know when it may flare up".  Pt reports she knows strategies to help keep it under control such as avoiding spicy and fried foods and taking medication as prescribed. Clinical Goal(s):  Collaboration with Eulogio Bear, NP regarding development and update of comprehensive plan of care as evidenced by provider attestation and co-signature Inter-disciplinary care team collaboration (see longitudinal plan of care) patient will work with care management team to address care coordination and chronic disease management needs related to Disease Management Educational Needs Care Coordination Medication Management and Education   Interventions:  Evaluation of current treatment plan related to Chronic Pancreatitis, Diabetes  type 2.  Self-management and patient's adherence to plan as established by provider. Collaboration with Eulogio Bear, NP regarding development and update of comprehensive plan of care as evidenced by provider attestation       and co-signature Inter-disciplinary care team collaboration (see longitudinal plan of care) Discussed plans with patient for ongoing care management follow up and provided patient with direct contact information for care management team Reviewed importance of taking all medications as prescribed, reviewed taking creon with meals and snacks Reviewed all upcoming doctor's appointments including primary care provider on 9/8 and dermatologist 10/2. Reviewed triggers for exacerbation of pancreatitis and importance of keeping stress to a minimum, eating healthy, drinking adequate fluids.  Self Care Activities:  Self administers medications as prescribed Attends all scheduled provider appointments Attends church or other social activities Calls provider office for new concerns or questions Patient Goals: - complete a HCPOA and living will- information mailed to you - discuss my treatment options with the doctor or nurse - do one enjoyable thing every day - spend time outdoors at least 3 times a week, continue walking and lifting hand weights - call your primary or GI doctor if not feeling well or symptoms flare up - avoid spicy and fried foods - take all medication as prescribed  Follow Up Plan: Telephone follow up appointment with care management team member scheduled for:   11/09/2020    Patient Care Plan: Diabetes Type 2 (Adult)     Problem Identified: Glycemic Management (Diabetes, Type 2)   Priority: Medium     Long-Range Goal: Glycemic Management Optimized   Start Date: 09/28/2020  Expected End Date: 03/31/2021  This Visit's Progress: On track  Priority: Medium  Note:   Objective:  Lab Results  Component Value Date   HGBA1C 6.5 (H) 07/20/2020   Lab  Results  Component Value Date   CREATININE 0.63 07/20/2020   CREATININE 0.91 02/24/2020   CREATININE 0.73 10/24/2019   No results found for: EGFR Current Barriers:  Knowledge Deficits related to basic Diabetes pathophysiology and self care/management- needs reinforcement of ADA/ carbohydrate modified diet, importance of good blood sugar control, pt reports she checks CBG BID with fasting readings 80-110 and random evening readings 100-160 per pt. Case Manager Clinical Goal(s):  patient will demonstrate improved adherence to prescribed treatment plan for diabetes self care/management as evidenced by: daily monitoring and recording of CBG  adherence to ADA/ carb modified diet contacting provider for new or worsened symptoms or questions Interventions:  Collaboration with Eulogio Bear, NP regarding development and update of comprehensive plan of care as evidenced by provider attestation and co-signature Inter-disciplinary care team collaboration (see longitudinal plan of care) Provided education to patient about basic DM disease process Reviewed medications with patient and discussed importance of medication adherence Discussed plans with patient for ongoing care management follow up and provided patient with direct contact information for care management team Provided patient with written educational materials related to hypo and hyperglycemia and importance of correct treatment Review of patient status, including review of consultants reports, relevant laboratory and other test results, and medications completed Reviewed ADA diet and food choices Reviewed importance of doing daily exercise  Self-Care Activities Attends all scheduled provider appointments Checks blood sugars as prescribed and utilize hyper and hypoglycemia protocol as needed Adheres to prescribed ADA/carb modified Patient Goals: - check blood sugar at prescribed times - check blood sugar if I feel it is too high or  too low - enter blood sugar readings and medication or insulin into daily log - take the blood sugar log to all doctor visits - take the blood sugar meter to all doctor visits - be mindful of carbohydrate intake with foods such as bread, pasta, rice and potatoes as these can elevate blood sugar - check feet daily for cuts, sores or redness - wash and dry feet carefully every day Follow Up Plan: Telephone follow up appointment with care management team member scheduled for:   11/09/2020.

## 2020-09-28 NOTE — Chronic Care Management (AMB) (Signed)
Chronic Care Management   CCM RN Visit Note  09/28/2020 Name: Jennifer Porter MRN: [id number] DOB: 11-11-1945  Subjective: Jennifer Porter is a 75 y.o. year old female who is a primary care patient of Eulogio Bear, NP. The care management team was consulted for assistance with disease management and care coordination needs.    Engaged with patient by telephone for initial visit in response to provider referral for case management and/or care coordination services.   Consent to Services:  The patient was given the following information about Chronic Care Management services today, agreed to services, and gave verbal consent: 1. CCM service includes personalized support from designated clinical staff supervised by the primary care provider, including individualized plan of care and coordination with other care providers 2. 24/7 contact phone numbers for assistance for urgent and routine care needs. 3. Service will only be billed when office clinical staff spend 20 minutes or more in a month to coordinate care. 4. Only one practitioner may furnish and bill the service in a calendar month. 5.The patient may stop CCM services at any time (effective at the end of the month) by phone call to the office staff. 6. The patient will be responsible for cost sharing (co-pay) of up to 20% of the service fee (after annual deductible is met). Patient agreed to services and consent obtained.  Patient agreed to services and verbal consent obtained.   Assessment: Review of patient past medical history, allergies, medications, health status, including review of consultants reports, laboratory and other test data, was performed as part of comprehensive evaluation and provision of chronic care management services.   SDOH (Social Determinants of Health) assessments and interventions performed:  SDOH Interventions    Flowsheet Row Most Recent Value  SDOH Interventions   Housing Interventions Intervention Not  Indicated  Transportation Interventions Intervention Not Indicated        CCM Care Plan  Allergies  Allergen Reactions   Codeine Nausea And Vomiting   Jardiance [Empagliflozin]    Macrobid [Nitrofurantoin] Hives   Simvastatin     headache    Outpatient Encounter Medications as of 09/28/2020  Medication Sig   aspirin EC 81 MG tablet Take 81 mg by mouth daily.   Cholecalciferol (VITAMIN D3) 125 MCG (5000 UT) TABS Take 5,000 Units by mouth daily.   Choline Fenofibrate (FENOFIBRIC ACID) 135 MG CPDR Take 1 capsule by mouth at bedtime.   Diclofenac Sodium CR 100 MG 24 hr tablet Take 1 tablet (100 mg total) by mouth daily as needed for pain.   dicyclomine (BENTYL) 10 MG capsule Take one capsule up to twice daily for abdominal cramping or loose stools.   estradiol (ESTRACE) 0.5 MG tablet TAKE ONE TABLET BY MOUTH DAILY   fluticasone (FLONASE) 50 MCG/ACT nasal spray Place 2 sprays into both nostrils daily as needed.   gabapentin (NEURONTIN) 300 MG capsule Take 1 capsule (300 mg total) by mouth daily.   insulin detemir (LEVEMIR FLEXTOUCH) 100 UNIT/ML FlexPen INJECT 5 TO 50 UNITS INTO THE SKIN AT BEDTIME NIGHTLY.   Insulin Pen Needle (BD PEN NEEDLE NANO U/F) 32G X 4 MM MISC USE UP TO TWICE DAILY   lipase/protease/amylase (CREON) 36000 UNITS CPEP capsule TAKE TWO CAPSULES BY MOUTH WITH MEALS AND ONE WITH SNACKS (UP TO 8 A DAY)   lisinopril (ZESTRIL) 20 MG tablet Take 1 tablet (20 mg total) by mouth daily.   Magnesium Oxide, Elemental, 400 MG TABS Take 1 tablet by mouth in the morning  and at bedtime.   metFORMIN (GLUCOPHAGE) 500 MG tablet Take 2 tablets (1,000 mg total) by mouth 2 (two) times daily with a meal. TAKE TWO (2) TABLETS BY MOUTH 2 TIMES DAILY WITH A MEAL   mirabegron ER (MYRBETRIQ) 25 MG TB24 tablet Take 1 tablet (25 mg total) by mouth daily.   pantoprazole (PROTONIX) 40 MG tablet Take one tablet once or twice daily, 30 minutes before a meal. (Patient taking differently: Take 40 mg by  mouth daily. Take one tablet once or twice daily, 30 minutes before a meal.)   pravastatin (PRAVACHOL) 10 MG tablet Take 1 tablet (10 mg total) by mouth at bedtime.   Probiotic Product (PROBIOTIC DAILY PO) Take by mouth daily as needed.    sodium chloride (OCEAN) 0.65 % SOLN nasal spray Place 1 spray into both nostrils as needed for congestion.   albuterol (PROVENTIL HFA;VENTOLIN HFA) 108 (90 Base) MCG/ACT inhaler Inhale 2 puffs into the lungs every 6 (six) hours as needed for wheezing or shortness of breath. (Patient not taking: Reported on 09/28/2020)   mometasone (ELOCON) 0.1 % cream Apply 1 application topically daily. (Patient not taking: Reported on 09/28/2020)   No facility-administered encounter medications on file as of 09/28/2020.    Patient Active Problem List   Diagnosis Date Noted   Acute recurrent pansinusitis 07/20/2020   Urinary retention 01/15/2020   Urinary urgency 01/15/2020   Abdominal cramping 08/21/2019   Protein-calorie malnutrition ([place]) 02/18/2019   Chronic pancreatitis ([place]) 08/24/2018   Loose stools 08/24/2018   Pancreatic calcification 08/22/2018   Acute pancreatitis without infection or necrosis 08/09/2018   Sigmoid diverticulosis 08/09/2018   3-vessel CAD 08/09/2018   Aortic atherosclerosis ([place]) 08/09/2018   Myelolipoma of right adrenal gland 08/09/2018   Renal cyst, right 08/09/2018   Hormone replacement therapy 01/12/2017   Hot flashes due to menopause 01/12/2017   Diarrhea 03/28/2016   Vitamin D deficiency 10/03/2014   Left knee pain 07/02/2014   Hypertension    Gastroesophageal reflux disease    Type 2 diabetes mellitus with other specified complication ([place])    Allergy    Asthma    H/O: gout    Hyperlipidemia     Conditions to be addressed/monitored:DMII, Chronic Pancreatitis  Care Plan : Chronic Pancreatitis  Updates made by [name], RN since 09/28/2020 12:00 AM     Problem: Health Promotion or Disease Self-Management - Chronic  Pancreatitis   Priority: High     Long-Range Goal: Self-Management Plan Developed for Chronic Pancreatitis   Start Date: 09/28/2020  Expected End Date: 03/31/2021  This Visit's Progress: On track  Priority: High  Note:   Current Barriers:  Ineffective Self Health Maintenance in a patient with Chronic Pancreatitis- pt needs reinforcement of management of chronic pancreatitis for optimal quality of life.  Pt reports she lives with spouse (and he has health issues as well), she is independent with ADL/ IADL's, continues to drive, leaves home for church, to meet with friends and other activities as often as she can, walks at times and lifts light hand weights at times.  Pt reports her blood pressure is under good control and pt checks BP on occasion.  Pt reports living with pancreatitis can be difficult at times as "you never know when it may flare up".  Pt reports she knows strategies to help keep it under control such as avoiding spicy and fried foods and taking medication as prescribed. Clinical Goal(s):  Collaboration with Eulogio Bear, NP regarding development  and update of comprehensive plan of care as evidenced by provider attestation and co-signature Inter-disciplinary care team collaboration (see longitudinal plan of care) patient will work with care management team to address care coordination and chronic disease management needs related to Disease Management Educational Needs Care Coordination Medication Management and Education   Interventions:  Evaluation of current treatment plan related to Chronic Pancreatitis, Diabetes type 2.  Self-management and patient's adherence to plan as established by provider. Collaboration with Eulogio Bear, NP regarding development and update of comprehensive plan of care as evidenced by provider attestation       and co-signature Inter-disciplinary care team collaboration (see longitudinal plan of care) Discussed plans with patient for  ongoing care management follow up and provided patient with direct contact information for care management team Reviewed importance of taking all medications as prescribed, reviewed taking creon with meals and snacks Reviewed all upcoming doctor's appointments including primary care provider on 9/8 and dermatologist 10/2. Reviewed triggers for exacerbation of pancreatitis and importance of keeping stress to a minimum, eating healthy, drinking adequate fluids.  Self Care Activities:  Self administers medications as prescribed Attends all scheduled provider appointments Attends church or other social activities Calls provider office for new concerns or questions Patient Goals: - complete a HCPOA and living will- information mailed to you - discuss my treatment options with the doctor or nurse - do one enjoyable thing every day - spend time outdoors at least 3 times a week, continue walking and lifting hand weights - call your primary or GI doctor if not feeling well or symptoms flare up - avoid spicy and fried foods - take all medication as prescribed Follow Up Plan: Telephone follow up appointment with care management team member scheduled for:   11/09/2020    Care Plan : Diabetes Type 2 (Adult)  Updates made by [name], RN since 09/28/2020 12:00 AM     Problem: Glycemic Management (Diabetes, Type 2)   Priority: Medium     Long-Range Goal: Glycemic Management Optimized   Start Date: 09/28/2020  Expected End Date: 03/31/2021  This Visit's Progress: On track  Priority: Medium  Note:   Objective:  Lab Results  Component Value Date   HGBA1C 6.5 (H) 07/20/2020   Lab Results  Component Value Date   CREATININE 0.63 07/20/2020   CREATININE 0.91 02/24/2020   CREATININE 0.73 10/24/2019   No results found for: EGFR Current Barriers:  Knowledge Deficits related to basic Diabetes pathophysiology and self care/management- needs reinforcement of ADA/ carbohydrate modified diet,  importance of good blood sugar control, pt reports she checks CBG BID with fasting readings 80-110 and random evening readings 100-160 per pt. Case Manager Clinical Goal(s):  patient will demonstrate improved adherence to prescribed treatment plan for diabetes self care/management as evidenced by: daily monitoring and recording of CBG  adherence to ADA/ carb modified diet contacting provider for new or worsened symptoms or questions Interventions:  Collaboration with Eulogio Bear, NP regarding development and update of comprehensive plan of care as evidenced by provider attestation and co-signature Inter-disciplinary care team collaboration (see longitudinal plan of care) Provided education to patient about basic DM disease process Reviewed medications with patient and discussed importance of medication adherence Discussed plans with patient for ongoing care management follow up and provided patient with direct contact information for care management team Provided patient with written educational materials related to hypo and hyperglycemia and importance of correct treatment Review of patient status, including review of consultants  reports, relevant laboratory and other test results, and medications completed Reviewed ADA diet and food choices Reviewed importance of doing daily exercise  Self-Care Activities Attends all scheduled provider appointments Checks blood sugars as prescribed and utilize hyper and hypoglycemia protocol as needed Adheres to prescribed ADA/carb modified Patient Goals: - check blood sugar at prescribed times - check blood sugar if I feel it is too high or too low - enter blood sugar readings and medication or insulin into daily log - take the blood sugar log to all doctor visits - take the blood sugar meter to all doctor visits - be mindful of carbohydrate intake with foods such as bread, pasta, rice and potatoes as these can elevate blood sugar - check feet  daily for cuts, sores or redness - wash and dry feet carefully every day Follow Up Plan: Telephone follow up appointment with care management team member scheduled for:   11/09/2020.     Plan:Telephone follow up appointment with care management team member scheduled for:  11/09/2020.  Jacqlyn Larsen RNC, BSN RN Case Manager [facility] [phone]

## 2020-10-09 ENCOUNTER — Other Ambulatory Visit: Payer: Self-pay | Admitting: Nurse Practitioner

## 2020-10-09 DIAGNOSIS — E785 Hyperlipidemia, unspecified: Secondary | ICD-10-CM

## 2020-10-14 ENCOUNTER — Other Ambulatory Visit: Payer: Self-pay | Admitting: Nurse Practitioner

## 2020-10-14 DIAGNOSIS — E785 Hyperlipidemia, unspecified: Secondary | ICD-10-CM

## 2020-11-03 DIAGNOSIS — M1712 Unilateral primary osteoarthritis, left knee: Secondary | ICD-10-CM | POA: Diagnosis not present

## 2020-11-09 ENCOUNTER — Ambulatory Visit (INDEPENDENT_AMBULATORY_CARE_PROVIDER_SITE_OTHER): Payer: Medicare Other | Admitting: *Deleted

## 2020-11-09 DIAGNOSIS — E119 Type 2 diabetes mellitus without complications: Secondary | ICD-10-CM | POA: Diagnosis not present

## 2020-11-09 DIAGNOSIS — K861 Other chronic pancreatitis: Secondary | ICD-10-CM

## 2020-11-09 NOTE — Chronic Care Management (AMB) (Signed)
Chronic Care Management   CCM RN Visit Note  11/09/2020 Name: Jennifer Porter MRN: [id number] DOB: 1945/06/21  Subjective: Jennifer Porter is a 75 y.o. year old female who is a primary care patient of Eulogio Bear, NP. The care management team was consulted for assistance with disease management and care coordination needs.    Engaged with patient by telephone for follow up visit in response to provider referral for case management and/or care coordination services.   Consent to Services:  The patient was given information about Chronic Care Management services, agreed to services, and gave verbal consent prior to initiation of services.  Please see initial visit note for detailed documentation.   Patient agreed to services and verbal consent obtained.   Assessment: Review of patient past medical history, allergies, medications, health status, including review of consultants reports, laboratory and other test data, was performed as part of comprehensive evaluation and provision of chronic care management services.   SDOH (Social Determinants of Health) assessments and interventions performed:    CCM Care Plan  Allergies  Allergen Reactions   Codeine Nausea And Vomiting   Jardiance [Empagliflozin]    Macrobid [Nitrofurantoin] Hives   Simvastatin     headache    Outpatient Encounter Medications as of 11/09/2020  Medication Sig   albuterol (PROVENTIL HFA;VENTOLIN HFA) 108 (90 Base) MCG/ACT inhaler Inhale 2 puffs into the lungs every 6 (six) hours as needed for wheezing or shortness of breath.   aspirin EC 81 MG tablet Take 81 mg by mouth daily.   Cholecalciferol (VITAMIN D3) 125 MCG (5000 UT) TABS Take 5,000 Units by mouth daily.   Choline Fenofibrate (FENOFIBRIC ACID) 135 MG CPDR TAKE ONE CAPSULE BY MOUTH EVERY NIGHT AT BEDTIME   Diclofenac Sodium CR 100 MG 24 hr tablet Take 1 tablet (100 mg total) by mouth daily as needed for pain.   dicyclomine (BENTYL) 10 MG capsule Take  one capsule up to twice daily for abdominal cramping or loose stools.   estradiol (ESTRACE) 0.5 MG tablet TAKE ONE TABLET BY MOUTH DAILY   fluticasone (FLONASE) 50 MCG/ACT nasal spray Place 2 sprays into both nostrils daily as needed.   gabapentin (NEURONTIN) 300 MG capsule Take 1 capsule (300 mg total) by mouth daily.   insulin detemir (LEVEMIR FLEXTOUCH) 100 UNIT/ML FlexPen INJECT 5 TO 50 UNITS INTO THE SKIN AT BEDTIME NIGHTLY.   Insulin Pen Needle (BD PEN NEEDLE NANO U/F) 32G X 4 MM MISC USE UP TO TWICE DAILY   lipase/protease/amylase (CREON) 36000 UNITS CPEP capsule TAKE TWO CAPSULES BY MOUTH WITH MEALS AND ONE WITH SNACKS (UP TO 8 A DAY)   lisinopril (ZESTRIL) 20 MG tablet Take 1 tablet (20 mg total) by mouth daily.   Magnesium Oxide, Elemental, 400 MG TABS Take 1 tablet by mouth in the morning and at bedtime.   metFORMIN (GLUCOPHAGE) 500 MG tablet Take 2 tablets (1,000 mg total) by mouth 2 (two) times daily with a meal. TAKE TWO (2) TABLETS BY MOUTH 2 TIMES DAILY WITH A MEAL   mirabegron ER (MYRBETRIQ) 25 MG TB24 tablet Take 1 tablet (25 mg total) by mouth daily.   pantoprazole (PROTONIX) 40 MG tablet Take one tablet once or twice daily, 30 minutes before a meal. (Patient taking differently: Take 40 mg by mouth daily. Take one tablet once or twice daily, 30 minutes before a meal.)   pravastatin (PRAVACHOL) 10 MG tablet TAKE ONE TABLET (10MG TOTAL) BY MOUTH ATBEDTIME   Probiotic Product (PROBIOTIC  DAILY PO) Take by mouth daily as needed.    sodium chloride (OCEAN) 0.65 % SOLN nasal spray Place 1 spray into both nostrils as needed for congestion.   mometasone (ELOCON) 0.1 % cream Apply 1 application topically daily. (Patient not taking: Reported on 11/09/2020)   No facility-administered encounter medications on file as of 11/09/2020.    Patient Active Problem List   Diagnosis Date Noted   Acute recurrent pansinusitis 07/20/2020   Urinary retention 01/15/2020   Urinary urgency 01/15/2020    Abdominal cramping 08/21/2019   Protein-calorie malnutrition ([place]) 02/18/2019   Chronic pancreatitis ([place]) 08/24/2018   Loose stools 08/24/2018   Pancreatic calcification 08/22/2018   Acute pancreatitis without infection or necrosis 08/09/2018   Sigmoid diverticulosis 08/09/2018   3-vessel CAD 08/09/2018   Aortic atherosclerosis ([place]) 08/09/2018   Myelolipoma of right adrenal gland 08/09/2018   Renal cyst, right 08/09/2018   Hormone replacement therapy 01/12/2017   Hot flashes due to menopause 01/12/2017   Diarrhea 03/28/2016   Vitamin D deficiency 10/03/2014   Left knee pain 07/02/2014   Hypertension    Gastroesophageal reflux disease    Type 2 diabetes mellitus with other specified complication ([place])    Allergy    Asthma    H/O: gout    Hyperlipidemia     Conditions to be addressed/monitored:HTN, Chronic pancreatitis  Care Plan : Chronic Pancreatitis  Updates made by [name], RN since 11/09/2020 12:00 AM     Problem: Health Promotion or Disease Self-Management - Chronic Pancreatitis   Priority: High     Long-Range Goal: Self-Management Plan Developed for Chronic Pancreatitis   Start Date: 09/28/2020  Expected End Date: 03/31/2021  This Visit's Progress: On track  Recent Progress: On track  Priority: High  Note:   Current Barriers:  Ineffective Self Health Maintenance in a patient with Chronic Pancreatitis- pt needs reinforcement of management of chronic pancreatitis for optimal quality of life.  Pt reports she lives with spouse (and he has health issues as well), she is independent with ADL/ IADL's, continues to drive, leaves home for church, to meet with friends and other activities as often as she can, walks at times and lifts light hand weights at times as she is able, reports has had problems with left knee and received steroid injection last week which has helped.  Pt reports her blood pressure is under good control and pt checks BP on occasion.  Pt reports  living with pancreatitis can be difficult at times as "you never know when it may flare up".  Pt reports she knows strategies to help keep it under control such as avoiding spicy and fried foods and taking medication as prescribed. Clinical Goal(s):  Collaboration with Eulogio Bear, NP regarding development and update of comprehensive plan of care as evidenced by provider attestation and co-signature Inter-disciplinary care team collaboration (see longitudinal plan of care) patient will work with care management team to address care coordination and chronic disease management needs related to Disease Management Educational Needs Care Coordination Medication Management and Education   Interventions:  Evaluation of current treatment plan related to Chronic Pancreatitis, Diabetes type 2.  Self-management and patient's adherence to plan as established by provider. Collaboration with Eulogio Bear, NP regarding development and update of comprehensive plan of care as evidenced by provider attestation       and co-signature Inter-disciplinary care team collaboration (see longitudinal plan of care) Reinforced plans with patient for ongoing care management follow up and provided  patient with direct contact information for care management team Reinforced importance of taking all medications as prescribed, reviewed taking creon with meals and snacks Reviewed all upcoming doctor's appointments including primary care provider on 9/8 and dermatologist 10/2. Reinforced triggers for exacerbation of pancreatitis and importance of keeping stress to a minimum, eating healthy, drinking adequate fluids.  Assessed that patient did receive Advanced directives packet in the mail, encouraged patient to look over and complete Self Care Activities:  Self administers medications as prescribed Attends all scheduled provider appointments Attends church or other social activities Calls provider office for new  concerns or questions Patient Goals: - call RN care manager at [phone] if you have any questions about completing HCPOA and living will - discuss my treatment options with the doctor or nurse - do one enjoyable thing every day - spend time outdoors at least 3 times a week as you  are able, continue walking and lifting hand weights -keep stress at a minimum - call your primary or GI doctor if not feeling well or symptoms flare up - avoid spicy and fried foods - take all medication as prescribed Follow Up Plan: Telephone follow up appointment with care management team member scheduled for:   12/25/2020    Care Plan : Diabetes Type 2 (Adult)  Updates made by [name], RN since 11/09/2020 12:00 AM     Problem: Glycemic Management (Diabetes, Type 2)   Priority: Medium     Long-Range Goal: Glycemic Management Optimized   Start Date: 09/28/2020  Expected End Date: 03/31/2021  This Visit's Progress: On track  Recent Progress: On track  Priority: Medium  Note:   Objective:  Lab Results  Component Value Date   HGBA1C 6.5 (H) 07/20/2020   Lab Results  Component Value Date   CREATININE 0.63 07/20/2020   CREATININE 0.91 02/24/2020   CREATININE 0.73 10/24/2019   No results found for: EGFR Current Barriers:  Knowledge Deficits related to basic Diabetes pathophysiology and self care/management- needs reinforcement of ADA/ carbohydrate modified diet, importance of good blood sugar control, pt reports she checks CBG BID with fasting readings 80-110 and random evening readings 100's range per pt. Case Manager Clinical Goal(s):  patient will demonstrate improved adherence to prescribed treatment plan for diabetes self care/management as evidenced by: daily monitoring and recording of CBG  adherence to ADA/ carb modified diet contacting provider for new or worsened symptoms or questions Interventions:  Collaboration with Eulogio Bear, NP regarding development and update of  comprehensive plan of care as evidenced by provider attestation and co-signature Inter-disciplinary care team collaboration (see longitudinal plan of care) Reinforced with patient about basic DM disease process Reviewed medications with patient and discussed importance of medication adherence Reinforced plans with patient for ongoing care management follow up and provided patient with direct contact information for care management team Reinforced signs/ symptoms hypo and hyperglycemia and importance of correct treatment Review of patient status, including review of consultants reports, relevant laboratory and other test results, and medications completed Reinforced ADA diet and food choices Reinforced importance of doing daily exercise and light weights as able Reviewed all upcoming appointments  9/8 primary care provider,  10/12 [name] (skin lesion) Self-Care Activities Attends all scheduled provider appointments Checks blood sugars as prescribed and utilize hyper and hypoglycemia protocol as needed Adheres to prescribed ADA/carb modified Patient Goals: - check blood sugar at prescribed times (twice daily) - check blood sugar if I feel it is too high or too low -  enter blood sugar readings and medication or insulin into daily log - take the blood sugar log and glucometer to all doctor visits - be mindful of carbohydrate intake with foods such as bread, pasta, rice and potatoes as these can elevate blood sugar - practice portion control - follow up with primary care provider 9/8 and [name] 10/7 (skin lesion) - check feet daily for cuts, sores or redness - wash and dry feet carefully every day Follow Up Plan: Telephone follow up appointment with care management team member scheduled for:   12/25/2020     Plan:Telephone follow up appointment with care management team member scheduled for:  12/25/2020  Jacqlyn Larsen [facility], BSN RN Case Manager [facility] [phone]

## 2020-11-09 NOTE — Patient Instructions (Signed)
Visit Information  PATIENT GOALS:  Goals Addressed             This Visit's Progress    Maintain My Quality of Life r/t Chronic Pancreatitis       Timeframe:  Long-Range Goal Priority:  High Start Date:     09/28/2020                        Expected End Date:     03/31/2021                  Follow Up Date- 12/25/2020   - call RN care manager at [phone] if you have any questions about completing HCPOA and living will - discuss my treatment options with the doctor or nurse - do one enjoyable thing every day - spend time outdoors at least 3 times a week as you  are able, continue walking and lifting hand weights -keep stress at a minimum - call your primary or GI doctor if not feeling well or symptoms flare up - avoid spicy and fried foods - take all medication as prescribed   Why is this important?   Having a long-term illness can be scary.  It can also be stressful for you and your caregiver.  These steps may help.    Notes:      Monitor and Manage My Blood Sugar-Diabetes Type 2       Timeframe:  Long-Range Goal Priority:  Medium Start Date:        09/28/2020                     Expected End Date:     03/31/2021                  Follow Up Date- 12/25/2020   - check blood sugar at prescribed times (twice daily) - check blood sugar if I feel it is too high or too low - enter blood sugar readings and medication or insulin into daily log - take the blood sugar log and glucometer to all doctor visits - be mindful of carbohydrate intake with foods such as bread, pasta, rice and potatoes as these can elevate blood sugar - practice portion control - follow up with primary care provider 9/8 and [name] 10/7 (skin lesion)     Why is this important?   Checking your blood sugar at home helps to keep it from getting very high or very low.  Writing the results in a diary or log helps the doctor know how to care for you.  Your blood sugar log should have the time, date and  the results.  Also, write down the amount of insulin or other medicine that you take.  Other information, like what you ate, exercise done and how you were feeling, will also be helpful.     Notes:         The patient verbalized understanding of instructions, educational materials, and care plan provided today and declined offer to receive copy of patient instructions, educational materials, and care plan.   Telephone follow up appointment with care management team member scheduled for:  12/25/2020  Jacqlyn Larsen [facility], BSN RN Case Manager [facility] [phone]

## 2020-11-24 ENCOUNTER — Other Ambulatory Visit: Payer: Self-pay

## 2020-11-24 ENCOUNTER — Other Ambulatory Visit: Payer: Medicare Other

## 2020-11-24 DIAGNOSIS — Z13220 Encounter for screening for lipoid disorders: Secondary | ICD-10-CM

## 2020-11-24 DIAGNOSIS — E119 Type 2 diabetes mellitus without complications: Secondary | ICD-10-CM | POA: Diagnosis not present

## 2020-11-24 DIAGNOSIS — Z136 Encounter for screening for cardiovascular disorders: Secondary | ICD-10-CM

## 2020-11-24 DIAGNOSIS — I10 Essential (primary) hypertension: Secondary | ICD-10-CM | POA: Diagnosis not present

## 2020-11-24 DIAGNOSIS — E785 Hyperlipidemia, unspecified: Secondary | ICD-10-CM | POA: Diagnosis not present

## 2020-11-25 LAB — COMPLETE METABOLIC PANEL WITH GFR
AG Ratio: 2 (calc) (ref 1.0–2.5)
ALT: 15 U/L (ref 6–29)
AST: 20 U/L (ref 10–35)
Albumin: 4.6 g/dL (ref 3.6–5.1)
Alkaline phosphatase (APISO): 23 U/L — ABNORMAL LOW (ref 37–153)
BUN/Creatinine Ratio: 40 (calc) — ABNORMAL HIGH (ref 6–22)
BUN: 27 mg/dL — ABNORMAL HIGH (ref 7–25)
CO2: 28 mmol/L (ref 20–32)
Calcium: 10 mg/dL (ref 8.6–10.4)
Chloride: 103 mmol/L (ref 98–110)
Creat: 0.68 mg/dL (ref 0.60–1.00)
Globulin: 2.3 g/dL (calc) (ref 1.9–3.7)
Glucose, Bld: 101 mg/dL — ABNORMAL HIGH (ref 65–99)
Potassium: 4.6 mmol/L (ref 3.5–5.3)
Sodium: 140 mmol/L (ref 135–146)
Total Bilirubin: 0.5 mg/dL (ref 0.2–1.2)
Total Protein: 6.9 g/dL (ref 6.1–8.1)
eGFR: 91 mL/min/{1.73_m2} (ref >=60)

## 2020-11-25 LAB — CBC WITH DIFFERENTIAL/PLATELET
Absolute Monocytes: 442 cells/uL (ref 200–950)
Basophils Absolute: 73 cells/uL (ref 0–200)
Basophils Relative: 1.3 %
Eosinophils Absolute: 140 cells/uL (ref 15–500)
Eosinophils Relative: 2.5 %
HCT: 38.9 % (ref 35.0–45.0)
Hemoglobin: 12.6 g/dL (ref 11.7–15.5)
Lymphs Abs: 1943 cells/uL (ref 850–3900)
MCH: 30.4 pg (ref 27.0–33.0)
MCHC: 32.4 g/dL (ref 32.0–36.0)
MCV: 94 fL (ref 80.0–100.0)
MPV: 11.1 fL (ref 7.5–12.5)
Monocytes Relative: 7.9 %
Neutro Abs: 3002 cells/uL (ref 1500–7800)
Neutrophils Relative %: 53.6 %
Platelets: 254 10*3/uL (ref 140–400)
RBC: 4.14 10*6/uL (ref 3.80–5.10)
RDW: 12.4 % (ref 11.0–15.0)
Total Lymphocyte: 34.7 %
WBC: 5.6 10*3/uL (ref 3.8–10.8)

## 2020-11-25 LAB — LIPID PANEL
Cholesterol: 134 mg/dL (ref <200)
HDL: 50 mg/dL (ref >=50)
LDL Cholesterol (Calc): 69 mg/dL (calc)
Non-HDL Cholesterol (Calc): 84 mg/dL (calc) (ref <130)
Total CHOL/HDL Ratio: 2.7 (calc) (ref <5.0)
Triglycerides: 73 mg/dL (ref <150)

## 2020-11-25 LAB — MAGNESIUM: Magnesium: 1.4 mg/dL — ABNORMAL LOW (ref 1.5–2.5)

## 2020-11-25 LAB — MICROALBUMIN / CREATININE URINE RATIO
Creatinine, Urine: 65 mg/dL (ref 20–275)
Microalb Creat Ratio: 26 mcg/mg creat (ref <30)
Microalb, Ur: 1.7 mg/dL

## 2020-11-25 LAB — HEMOGLOBIN A1C
Hgb A1c MFr Bld: 6.5 % of total Hgb — ABNORMAL HIGH (ref <5.7)
Mean Plasma Glucose: 140 mg/dL
eAG (mmol/L): 7.7 mmol/L

## 2020-11-25 NOTE — Progress Notes (Signed)
Subjective:    Patient ID: Jennifer Porter, female    DOB: 1946/03/03, 75 y.o.   MRN: [id number]  HPI: Jennifer Porter is a 75 y.o. female presenting for follow-up.  Chief Complaint  Patient presents with   Follow-up    Had labs   Overactive Bladder    Was taking Myrbetriq but insurance will not cover   Patient reports frustration today.  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  She reports she takes him to a lot of his specialty appointments and this is very time-consuming.  Stopped taking Myrbetriq secondary due to cost.  It is hard for her to make it to the bathroom in time.  HYPERTENSION / HYPERLIPIDEMIA Currently taking lisinopril 20 mg daily for high blood pressure.  Taking pravastatin 10 mg daily, fenofibrate 135 mg daily for cholesterol levels. Satisfied with current treatment? yes Duration of hypertension: chronic BP monitoring frequency: rarely BP range: 120s/60s BP medication side effects: no Duration of hyperlipidemia: chronic Cholesterol medication side effects: no Aspirin: yes Recent stressors: yes Recurrent headaches: no Visual changes: no Palpitations: no Dyspnea: no Chest pain: no Lower extremity edema: no Dizzy/lightheaded: no  DIABETES Currently taking metformin 1000 mg in the morning and 500 mg in evening.  Also taking Levemir 5 units nightly at bedtime.  She did bring her blood glucose logs with her today. Hypoglycemic episodes:no Polydipsia/polyuria: no Visual disturbance: no Chest pain: no Paresthesias: yes, numbness in toes Glucose Monitoring: yes  Accucheck frequency: daily in morning and evening  Fasting glucose: less than 120   Evening: less than 250 Taking Insulin?: yes  Long acting insulin: levemir 5 units nightly Retinal Examination: Up to Date Foot Exam: Up to Date Diabetic Education: Completed Pneumovax: Up to Date Influenza: Up to Date Aspirin: yes  MENOPAUSAL SYMPTOMS She is having hot flashes  "pretty bad" at night.  She was on Premarin in the past, but her insurance stopped covering this.  She was on 1 mg of estradiol, now she is on estradiol 0.5 mg daily. Duration: stable Symptom severity: moderate Hot flashes: nightly almost every night Night sweats: yes Sleep disturbances: no Emotional lability: yes Stress incontinence: yes Previous HRT/pharmacotherapy: yes Hysterectomy: yes Absolute Contraindications to Hormonal Therapy:     Undiagnosed vaginal bleeding: no    Breast cancer: no    Endometrial cancer: no    Coronary disease: no    Cerebrovascular disease: no    Venous thromboembolic disease: no  HYPOMAGNESEMIA  Currently taking magnesium oxide 400 mg daily once daily.  Allergies  Allergen Reactions   Codeine Nausea And Vomiting   Jardiance [Empagliflozin]    Macrobid [Nitrofurantoin] Hives   Simvastatin     headache    Outpatient Encounter Medications as of 11/26/2020  Medication Sig   aspirin EC 81 MG tablet Take 81 mg by mouth daily.   Cholecalciferol (VITAMIN D3) 125 MCG (5000 UT) TABS Take 5,000 Units by mouth daily.   Choline Fenofibrate (FENOFIBRIC ACID) 135 MG CPDR TAKE ONE CAPSULE BY MOUTH EVERY NIGHT AT BEDTIME   Diclofenac Sodium CR 100 MG 24 hr tablet Take 1 tablet (100 mg total) by mouth daily as needed for pain.   dicyclomine (BENTYL) 10 MG capsule Take one capsule up to twice daily for abdominal cramping or loose stools.   estradiol (ESTRACE) 0.5 MG tablet TAKE ONE TABLET BY MOUTH DAILY   fluticasone (FLONASE) 50 MCG/ACT nasal spray Place 2 sprays into both nostrils daily as needed.  gabapentin (NEURONTIN) 300 MG capsule Take 1 capsule (300 mg total) by mouth daily.   insulin detemir (LEVEMIR FLEXTOUCH) 100 UNIT/ML FlexPen INJECT 5 TO 50 UNITS INTO THE SKIN AT BEDTIME NIGHTLY.   Insulin Pen Needle (BD PEN NEEDLE NANO U/F) 32G X 4 MM MISC USE UP TO TWICE DAILY   lipase/protease/amylase (CREON) 36000 UNITS CPEP capsule TAKE TWO CAPSULES BY MOUTH  WITH MEALS AND ONE WITH SNACKS (UP TO 8 A DAY)   lisinopril (ZESTRIL) 20 MG tablet Take 1 tablet (20 mg total) by mouth daily.   Magnesium Oxide, Elemental, 400 MG TABS Take 1 tablet by mouth in the morning and at bedtime.   metFORMIN (GLUCOPHAGE) 500 MG tablet Take 2 tablets (1,000 mg total) by mouth 2 (two) times daily with a meal. TAKE TWO (2) TABLETS BY MOUTH 2 TIMES DAILY WITH A MEAL   mometasone (ELOCON) 0.1 % cream Apply 1 application topically daily. (Patient not taking: Reported on 11/09/2020)   pantoprazole (PROTONIX) 40 MG tablet Take one tablet once or twice daily, 30 minutes before a meal. (Patient taking differently: Take 40 mg by mouth daily. Take one tablet once or twice daily, 30 minutes before a meal.)   pravastatin (PRAVACHOL) 10 MG tablet TAKE ONE TABLET (10MG TOTAL) BY MOUTH ATBEDTIME   Probiotic Product (PROBIOTIC DAILY PO) Take by mouth daily as needed.    sodium chloride (OCEAN) 0.65 % SOLN nasal spray Place 1 spray into both nostrils as needed for congestion.   [DISCONTINUED] albuterol (PROVENTIL HFA;VENTOLIN HFA) 108 (90 Base) MCG/ACT inhaler Inhale 2 puffs into the lungs every 6 (six) hours as needed for wheezing or shortness of breath.   [DISCONTINUED] mirabegron ER (MYRBETRIQ) 25 MG TB24 tablet Take 1 tablet (25 mg total) by mouth daily.   No facility-administered encounter medications on file as of 11/26/2020.    Patient Active Problem List   Diagnosis Date Noted   Hyperlipidemia associated with type 2 diabetes mellitus ([place]) 11/28/2020   Acute recurrent pansinusitis 07/20/2020   Urinary retention 01/15/2020   Urinary urgency 01/15/2020   Abdominal cramping 08/21/2019   Protein-calorie malnutrition ([place]) 02/18/2019   Chronic pancreatitis ([place]) 08/24/2018   Loose stools 08/24/2018   Pancreatic calcification 08/22/2018   Acute pancreatitis without infection or necrosis 08/09/2018   Sigmoid diverticulosis 08/09/2018   3-vessel CAD 08/09/2018   Aortic  atherosclerosis ([place]) 08/09/2018   Myelolipoma of right adrenal gland 08/09/2018   Renal cyst, right 08/09/2018   Hormone replacement therapy 01/12/2017   Hot flashes due to menopause 01/12/2017   Hypomagnesemia 06/27/2016   Diarrhea 03/28/2016   Vitamin D deficiency 10/03/2014   Left knee pain 07/02/2014   Hypertension    Gastroesophageal reflux disease    Type 2 diabetes mellitus with other specified complication ([place])    Allergy    Asthma    H/O: gout    Hyperlipidemia     Past Medical History:  Diagnosis Date   Allergy    seasonal   Asthma    Complication of anesthesia    Diabetes mellitus without complication ([place])    GERD (gastroesophageal reflux disease)    hiatal hernia   H/O: gout    Hyperlipidemia    Hypertension    Pancreatitis    PONV (postoperative nausea and vomiting)     Relevant past medical, surgical, family and social history reviewed and updated as indicated. Interim medical history since our last visit reviewed.  Review of Systems Per HPI unless specifically indicated above  Objective:    BP 128/68   Pulse 84   Temp 98.5 F (36.9 C) (Temporal)   Resp 16   Ht 5' 4.5" (1.638 m)   Wt 126 lb (57.2 kg)   SpO2 99%   BMI 21.29 kg/m   Wt Readings from Last 3 Encounters:  11/26/20 126 lb (57.2 kg)  07/20/20 124 lb (56.2 kg)  02/25/20 124 lb 6.4 oz (56.4 kg)    Physical Exam Vitals and nursing note reviewed.  Constitutional:      General: She is not in acute distress.    Appearance: Normal appearance. She is not toxic-appearing.  HENT:     Head: Normocephalic and atraumatic.  Eyes:     General: No scleral icterus.    Extraocular Movements: Extraocular movements intact.  Neck:     Vascular: No carotid bruit.  Cardiovascular:     Rate and Rhythm: Normal rate and regular rhythm.     Heart sounds: Normal heart sounds. No murmur heard. Pulmonary:     Effort: Pulmonary effort is normal. No respiratory distress.     Breath sounds: Normal  breath sounds. No wheezing, rhonchi or rales.  Abdominal:     General: Abdomen is flat. Bowel sounds are normal.     Palpations: Abdomen is soft.     Tenderness: There is no abdominal tenderness.  Musculoskeletal:        General: No swelling or tenderness.     Cervical back: Normal range of motion.     Right lower leg: No edema.     Left lower leg: No edema.  Skin:    General: Skin is warm and dry.     Capillary Refill: Capillary refill takes less than 2 seconds.     Coloration: Skin is not jaundiced.     Findings: No bruising.  Neurological:     General: No focal deficit present.     Mental Status: She is alert and oriented to person, place, and time.     Motor: No weakness.     Gait: Gait normal.  Psychiatric:        Mood and Affect: Mood normal.        Behavior: Behavior normal.        Thought Content: Thought content normal.        Judgment: Judgment normal.    Results for orders placed or performed in visit on 11/24/20  CBC with Differential/Platelet  Result Value Ref Range   WBC 5.6 3.8 - 10.8 Thousand/uL   RBC 4.14 3.80 - 5.10 Million/uL   Hemoglobin 12.6 11.7 - 15.5 g/dL   HCT 38.9 35.0 - 45.0 %   MCV 94.0 80.0 - 100.0 fL   MCH 30.4 27.0 - 33.0 pg   MCHC 32.4 32.0 - 36.0 g/dL   RDW 12.4 11.0 - 15.0 %   Platelets 254 140 - 400 Thousand/uL   MPV 11.1 7.5 - 12.5 fL   Neutro Abs 3,002 1,500 - 7,800 cells/uL   Lymphs Abs 1,943 850 - 3,900 cells/uL   Absolute Monocytes 442 200 - 950 cells/uL   Eosinophils Absolute 140 15 - 500 cells/uL   Basophils Absolute 73 0 - 200 cells/uL   Neutrophils Relative % 53.6 %   Total Lymphocyte 34.7 %   Monocytes Relative 7.9 %   Eosinophils Relative 2.5 %   Basophils Relative 1.3 %  COMPLETE METABOLIC PANEL WITH GFR  Result Value Ref Range   Glucose, Bld 101 (H) 65 - 99 mg/dL  BUN 27 (H) 7 - 25 mg/dL   Creat 0.68 0.60 - 1.00 mg/dL   eGFR 91 > OR = 60 mL/min/1.74m   BUN/Creatinine Ratio 40 (H) 6 - 22 (calc)   Sodium 140 135  - 146 mmol/L   Potassium 4.6 3.5 - 5.3 mmol/L   Chloride 103 98 - 110 mmol/L   CO2 28 20 - 32 mmol/L   Calcium 10.0 8.6 - 10.4 mg/dL   Total Protein 6.9 6.1 - 8.1 g/dL   Albumin 4.6 3.6 - 5.1 g/dL   Globulin 2.3 1.9 - 3.7 g/dL (calc)   AG Ratio 2.0 1.0 - 2.5 (calc)   Total Bilirubin 0.5 0.2 - 1.2 mg/dL   Alkaline phosphatase (APISO) 23 (L) 37 - 153 U/L   AST 20 10 - 35 U/L   ALT 15 6 - 29 U/L  Hemoglobin A1c  Result Value Ref Range   Hgb A1c MFr Bld 6.5 (H) <5.7 % of total Hgb   Mean Plasma Glucose 140 mg/dL   eAG (mmol/L) 7.7 mmol/L  Lipid panel  Result Value Ref Range   Cholesterol 134 <200 mg/dL   HDL 50 > OR = 50 mg/dL   Triglycerides 73 <150 mg/dL   LDL Cholesterol (Calc) 69 mg/dL (calc)   Total CHOL/HDL Ratio 2.7 <5.0 (calc)   Non-HDL Cholesterol (Calc) 84 <130 mg/dL (calc)  Microalbumin / creatinine urine ratio  Result Value Ref Range   Creatinine, Urine 65 20 - 275 mg/dL   Microalb, Ur 1.7 mg/dL   Microalb Creat Ratio 26 <30 mcg/mg creat  Magnesium  Result Value Ref Range   Magnesium 1.4 (L) 1.5 - 2.5 mg/dL      Assessment & Plan:   Problem List Items Addressed This Visit       Cardiovascular and Mediastinum   Hypertension    Chronic.  Blood pressure is stable today in clinic and reportedly stable at home as well.  Continue current medication including lisinopril 20 mg daily.  Electrolytes and kidney function is normal.  Follow-up in 4 months.      Hot flashes due to menopause    Chronic.  Patient has been on longstanding hormone replacement therapy, about 9 months ago was reduced from estradiol 1 mg to 0.5 mg daily.  She is having breakthrough hot flashes and does not want to decrease estradiol further at this time.  We did discuss the risk of continuing to take this medication given her age and comorbid diseases, risk versus benefit was discussed and benefits outweigh risk.  Patient elects to continue hormone replacement therapy for now.      Aortic  atherosclerosis (HCC) - Primary    Chronic.  Continue pravastatin 10 mg daily and fenofibrate for triglycerides.  Recent lipid panel shows LDL less than 70, which is great given concurrent diabetes.  Recent liver enzymes look good.  Follow-up in 4 months.        Endocrine   Type 2 diabetes mellitus with other specified complication (HCC)    Chronic.  Recent A1c 6.5%.  No low blood sugar readings on blood sugar logs today.  Continue current medications for now including metformin 1000 mg in the morning and 500 mg in the afternoon and Levemir 5 units at bedtime.  Eye exam and foot exam are up-to-date.  Patient is on a statin and LDL is less than 70.  Follow-up in 4 months.      Hyperlipidemia associated with type 2 diabetes mellitus ([place]  Chronic.  LDL is less than 70 and hemoglobin A1c is less than 7%.  Continue current medications including pravastatin 10 mg daily, metformin 1000 mg daily in the morning and 500 mg in the afternoon.  Follow-up in 4 months.        Other   Urinary urgency    Chronic.   No signs or symptoms of UTI today.  Since Myrbetriq was previously prescribed by urology, I recommended she follow-up with them to determine the best course of action for possible overactive bladder.  I do not think an anticholinergic agent would be very beneficial for her.        Hypomagnesemia    Chronic.  Plan to increase magnesium to twice daily and recheck at next visit.      Hyperlipidemia    Chronic.  Continue pravastatin 10 mg daily and fenofibrate for triglycerides.  Recent lipid panel shows LDL less than 70, which is great given concurrent diabetes.  Recent liver enzymes look good.  Follow-up in 4 months.         Follow up plan: No follow-ups on file.

## 2020-11-26 ENCOUNTER — Other Ambulatory Visit: Payer: Self-pay

## 2020-11-26 ENCOUNTER — Ambulatory Visit (INDEPENDENT_AMBULATORY_CARE_PROVIDER_SITE_OTHER): Payer: Medicare Other | Admitting: Nurse Practitioner

## 2020-11-26 VITALS — BP 128/68 | HR 84 | Temp 98.5°F | Resp 16 | Ht 64.5 in | Wt 126.0 lb

## 2020-11-26 DIAGNOSIS — I10 Essential (primary) hypertension: Secondary | ICD-10-CM | POA: Diagnosis not present

## 2020-11-26 DIAGNOSIS — E1169 Type 2 diabetes mellitus with other specified complication: Secondary | ICD-10-CM

## 2020-11-26 DIAGNOSIS — E785 Hyperlipidemia, unspecified: Secondary | ICD-10-CM

## 2020-11-26 DIAGNOSIS — I700 Atherosclerosis of aorta: Secondary | ICD-10-CM

## 2020-11-26 DIAGNOSIS — N951 Menopausal and female climacteric states: Secondary | ICD-10-CM

## 2020-11-26 DIAGNOSIS — Z794 Long term (current) use of insulin: Secondary | ICD-10-CM | POA: Diagnosis not present

## 2020-11-26 DIAGNOSIS — R3915 Urgency of urination: Secondary | ICD-10-CM

## 2020-11-28 ENCOUNTER — Encounter: Payer: Self-pay | Admitting: Nurse Practitioner

## 2020-11-28 DIAGNOSIS — E1169 Type 2 diabetes mellitus with other specified complication: Secondary | ICD-10-CM | POA: Insufficient documentation

## 2020-11-28 NOTE — Assessment & Plan Note (Addendum)
Chronic.  Blood pressure is stable today in clinic and reportedly stable at home as well.  Continue current medication including lisinopril 20 mg daily.  Electrolytes and kidney function is normal.  Follow-up in 4 months.

## 2020-11-28 NOTE — Assessment & Plan Note (Signed)
Chronic.  Recent A1c 6.5%.  No low blood sugar readings on blood sugar logs today.  Continue current medications for now including metformin 1000 mg in the morning and 500 mg in the afternoon and Levemir 5 units at bedtime.  Eye exam and foot exam are up-to-date.  Patient is on a statin and LDL is less than 70.  Follow-up in 4 months.

## 2020-11-28 NOTE — Assessment & Plan Note (Signed)
Chronic.  Continue pravastatin 10 mg daily and fenofibrate for triglycerides.  Recent lipid panel shows LDL less than 70, which is great given concurrent diabetes.  Recent liver enzymes look good.  Follow-up in 4 months.

## 2020-11-28 NOTE — Assessment & Plan Note (Addendum)
Chronic.   No signs or symptoms of UTI today.  Since Myrbetriq was previously prescribed by urology, I recommended she follow-up with them to determine the best course of action for possible overactive bladder.  I do not think an anticholinergic agent would be very beneficial for her.

## 2020-11-28 NOTE — Assessment & Plan Note (Signed)
Chronic.  Patient has been on longstanding hormone replacement therapy, about 9 months ago was reduced from estradiol 1 mg to 0.5 mg daily.  She is having breakthrough hot flashes and does not want to decrease estradiol further at this time.  We did discuss the risk of continuing to take this medication given her age and comorbid diseases, risk versus benefit was discussed and benefits outweigh risk.  Patient elects to continue hormone replacement therapy for now.

## 2020-11-28 NOTE — Assessment & Plan Note (Signed)
Chronic.  Plan to increase magnesium to twice daily and recheck at next visit.

## 2020-11-28 NOTE — Assessment & Plan Note (Signed)
Chronic.  LDL is less than 70 and hemoglobin A1c is less than 7%.  Continue current medications including pravastatin 10 mg daily, metformin 1000 mg daily in the morning and 500 mg in the afternoon.  Follow-up in 4 months.

## 2020-12-17 ENCOUNTER — Other Ambulatory Visit: Payer: Self-pay | Admitting: Nurse Practitioner

## 2020-12-25 ENCOUNTER — Ambulatory Visit (INDEPENDENT_AMBULATORY_CARE_PROVIDER_SITE_OTHER): Payer: Medicare Other | Admitting: *Deleted

## 2020-12-25 DIAGNOSIS — E1169 Type 2 diabetes mellitus with other specified complication: Secondary | ICD-10-CM

## 2020-12-25 DIAGNOSIS — K861 Other chronic pancreatitis: Secondary | ICD-10-CM

## 2020-12-25 NOTE — Chronic Care Management (AMB) (Signed)
Chronic Care Management   CCM RN Visit Note  12/25/2020 Name: Jennifer Porter MRN: [id number] DOB: 1945-11-19  Subjective: Jennifer Porter is a 75 y.o. year old female who is a primary care patient of Eulogio Bear, NP. The care management team was consulted for assistance with disease management and care coordination needs.    Engaged with patient by telephone for follow up visit in response to provider referral for case management and/or care coordination services.   Consent to Services:  The patient was given information about Chronic Care Management services, agreed to services, and gave verbal consent prior to initiation of services.  Please see initial visit note for detailed documentation.   Patient agreed to services and verbal consent obtained.   Assessment: Review of patient past medical history, allergies, medications, health status, including review of consultants reports, laboratory and other test data, was performed as part of comprehensive evaluation and provision of chronic care management services.   SDOH (Social Determinants of Health) assessments and interventions performed:    CCM Care Plan  Allergies  Allergen Reactions   Codeine Nausea And Vomiting   Jardiance [Empagliflozin]    Macrobid [Nitrofurantoin] Hives   Simvastatin     headache    Outpatient Encounter Medications as of 12/25/2020  Medication Sig   aspirin EC 81 MG tablet Take 81 mg by mouth daily.   Cholecalciferol (VITAMIN D3) 125 MCG (5000 UT) TABS Take 5,000 Units by mouth daily.   Choline Fenofibrate (FENOFIBRIC ACID) 135 MG CPDR TAKE ONE CAPSULE BY MOUTH EVERY NIGHT AT BEDTIME   Diclofenac Sodium CR 100 MG 24 hr tablet Take 1 tablet (100 mg total) by mouth daily as needed for pain.   dicyclomine (BENTYL) 10 MG capsule Take one capsule up to twice daily for abdominal cramping or loose stools.   estradiol (ESTRACE) 0.5 MG tablet TAKE ONE TABLET BY MOUTH DAILY   fluticasone (FLONASE) 50  MCG/ACT nasal spray Place 2 sprays into both nostrils daily as needed.   gabapentin (NEURONTIN) 300 MG capsule TAKE 1 CAPSULE BY MOUTH THREE TIMES A DAY   insulin detemir (LEVEMIR FLEXTOUCH) 100 UNIT/ML FlexPen INJECT 5 TO 50 UNITS INTO THE SKIN AT BEDTIME NIGHTLY.   Insulin Pen Needle (BD PEN NEEDLE NANO U/F) 32G X 4 MM MISC USE UP TO TWICE DAILY   lipase/protease/amylase (CREON) 36000 UNITS CPEP capsule TAKE TWO CAPSULES BY MOUTH WITH MEALS AND ONE WITH SNACKS (UP TO 8 A DAY)   lisinopril (ZESTRIL) 20 MG tablet Take 1 tablet (20 mg total) by mouth daily.   Magnesium Oxide, Elemental, 400 MG TABS Take 1 tablet by mouth in the morning and at bedtime. (Patient taking differently: Take 1 tablet by mouth in the morning and at bedtime. Takes BID per pt)   metFORMIN (GLUCOPHAGE) 500 MG tablet Take 2 tablets (1,000 mg total) by mouth 2 (two) times daily with a meal. TAKE TWO (2) TABLETS BY MOUTH 2 TIMES DAILY WITH A MEAL   pantoprazole (PROTONIX) 40 MG tablet Take one tablet once or twice daily, 30 minutes before a meal. (Patient taking differently: Take 40 mg by mouth daily. Take one tablet once or twice daily, 30 minutes before a meal.)   pravastatin (PRAVACHOL) 10 MG tablet TAKE ONE TABLET (10MG TOTAL) BY MOUTH ATBEDTIME   Probiotic Product (PROBIOTIC DAILY PO) Take by mouth daily as needed.    sodium chloride (OCEAN) 0.65 % SOLN nasal spray Place 1 spray into both nostrils as needed for congestion.  mometasone (ELOCON) 0.1 % cream Apply 1 application topically daily. (Patient not taking: No sig reported)   No facility-administered encounter medications on file as of 12/25/2020.    Patient Active Problem List   Diagnosis Date Noted   Hyperlipidemia associated with type 2 diabetes mellitus ([place]) 11/28/2020   Acute recurrent pansinusitis 07/20/2020   Urinary retention 01/15/2020   Urinary urgency 01/15/2020   Abdominal cramping 08/21/2019   Protein-calorie malnutrition ([place]) 02/18/2019   Chronic  pancreatitis ([place]) 08/24/2018   Loose stools 08/24/2018   Pancreatic calcification 08/22/2018   Acute pancreatitis without infection or necrosis 08/09/2018   Sigmoid diverticulosis 08/09/2018   3-vessel CAD 08/09/2018   Aortic atherosclerosis ([place]) 08/09/2018   Myelolipoma of right adrenal gland 08/09/2018   Renal cyst, right 08/09/2018   Hormone replacement therapy 01/12/2017   Hot flashes due to menopause 01/12/2017   Hypomagnesemia 06/27/2016   Diarrhea 03/28/2016   Vitamin D deficiency 10/03/2014   Left knee pain 07/02/2014   Hypertension    Gastroesophageal reflux disease    Type 2 diabetes mellitus with other specified complication ([place])    Allergy    Asthma    H/O: gout    Hyperlipidemia     Conditions to be addressed/monitored:DMII  Chronic pancreatits  Care Plan : Chronic Pancreatitis  Updates made by [name], RN since 12/25/2020 12:00 AM     Problem: Health Promotion or Disease Self-Management - Chronic Pancreatitis   Priority: High     Long-Range Goal: Self-Management Plan Developed for Chronic Pancreatitis   Start Date: 09/28/2020  Expected End Date: 03/31/2021  This Visit's Progress: On track  Recent Progress: On track  Priority: High  Note:   Current Barriers:  Ineffective Self Health Maintenance in a patient with Chronic Pancreatitis- pt needs reinforcement of management of chronic pancreatitis for optimal quality of life.  Pt reports she lives with spouse (and he has health issues as well), she is independent with ADL/ IADL's, continues to drive, leaves home for church, to meet with friends and other activities as often as she can, walks at times and lifts light hand weights at times as she is able, reports has had problems with left knee and received steroid injection which has helped.  Pt reports her blood pressure is under good control and pt checks BP on occasion.  Pt reports living with pancreatitis can be difficult at times as "you never know when  it may flare up". Pt reports she had a "flare up not too long ago but I'm better now"  Pt reports she knows strategies to help keep it under control such as avoiding spicy and fried foods and taking medication as prescribed, calling her doctor as needed. Clinical Goal(s):  Collaboration with Eulogio Bear, NP regarding development and update of comprehensive plan of care as evidenced by provider attestation and co-signature Inter-disciplinary care team collaboration (see longitudinal plan of care) patient will work with care management team to address care coordination and chronic disease management needs related to Disease Management Educational Needs Care Coordination Medication Management and Education   Interventions:  Evaluation of current treatment plan related to Chronic Pancreatitis, Diabetes type 2.  Self-management and patient's adherence to plan as established by provider. Collaboration with Eulogio Bear, NP regarding development and update of comprehensive plan of care as evidenced by provider attestation       and co-signature Inter-disciplinary care team collaboration (see longitudinal plan of care) Reviewed plans with patient for ongoing care management follow  up and provided patient with direct contact information for care management team Reviewed importance of taking all medications as prescribed, reviewed taking creon with meals and snacks Reviewed all upcoming doctor's appointments including primary care provider on 12/8 and dermatologist 10/2. Reviewed triggers for exacerbation of pancreatitis and importance of keeping stress to a minimum, eating healthy, drinking adequate fluids.  Encouraged patient to complete Advanced directives, call RN care manager with any questions Self Care Activities:  Self administers medications as prescribed Attends all scheduled provider appointments Attends church or other social activities Calls provider office for new concerns or  questions Patient Goals: - call RN care manager at [phone] if you have any questions about completing HCPOA and living will - do one enjoyable thing every day- hobbies, friends, etc. - spend time outdoors at least 3 times a week as you  are able, continue walking and lifting hand weights -keep stress under control - call your primary or GI doctor early on if not feeling well or symptoms flare up - avoid spicy and fried foods - take all medication as prescribed - follow up with primary care provider 02/25/21 Follow Up Plan: Telephone follow up appointment with care management team member scheduled for:   03/05/2021    Care Plan : Diabetes Type 2 (Adult)  Updates made by [name], RN since 12/25/2020 12:00 AM     Problem: Glycemic Management (Diabetes, Type 2)   Priority: Medium     Long-Range Goal: Glycemic Management Optimized   Start Date: 09/28/2020  Expected End Date: 03/31/2021  This Visit's Progress: On track  Recent Progress: On track  Priority: Medium  Note:   Objective:  Lab Results  Component Value Date   HGBA1C 6.5 (H) 07/20/2020   Lab Results  Component Value Date   CREATININE 0.63 07/20/2020   CREATININE 0.91 02/24/2020   CREATININE 0.73 10/24/2019   No results found for: EGFR Current Barriers:  Knowledge Deficits related to basic Diabetes pathophysiology and self care/management- needs reinforcement of ADA/ carbohydrate modified diet, importance of good blood sugar control, pt reports she checks CBG BID with fasting readings 80-100 and random evening readings 120-140 range with occasional reading near 180 per pt. Case Manager Clinical Goal(s):  patient will demonstrate improved adherence to prescribed treatment plan for diabetes self care/management as evidenced by: daily monitoring and recording of CBG  adherence to ADA/ carb modified diet contacting provider for new or worsened symptoms or questions Interventions:  Collaboration with Eulogio Bear, NP regarding development and update of comprehensive plan of care as evidenced by provider attestation and co-signature Inter-disciplinary care team collaboration (see longitudinal plan of care) Reinforced with patient about basic DM disease process Reviewed medications with patient and discussed importance of medication adherence, updated medication profile Reviewed plans with patient for ongoing care management follow up and provided patient with direct contact information for care management team Reviewed signs/ symptoms hypo and hyperglycemia and importance of correct treatment Review of patient status, including review of consultants reports, relevant laboratory and other test results, and medications completed Reviewed ADA diet and food choices Reviewed importance of doing daily exercise and light weights as able, getting outdoors daily Reviewed all upcoming appointments  12/8 primary care provider,  10/12 [name] (skin lesion) Self-Care Activities Attends all scheduled provider appointments Checks blood sugars as prescribed and utilize hyper and hypoglycemia protocol as needed Adheres to prescribed ADA/carb modified Patient Goals: - continue checking blood sugar at prescribed times (twice daily) and record  in log - check blood sugar if I feel it is too high or too low - take the blood sugar log and glucometer to all doctor visits - continue to watch carbohydrate intake with foods such as bread, pasta, rice and potatoes as these can elevate blood sugar - avoid concentrated sweets/ simple carbohydrates - follow up with primary care provider 12/8 and [name] 10/7 (skin lesion) - check feet daily for cuts, sores or redness - wash and dry feet carefully every day Follow Up Plan: Telephone follow up appointment with care management team member scheduled for:   03/05/2021     Plan:Telephone follow up appointment with care management team member scheduled for:   03/05/2021  Jacqlyn Larsen [facility], BSN RN Case Manager [place] [facility] [phone]

## 2020-12-25 NOTE — Patient Instructions (Signed)
Visit Information  PATIENT GOALS:  Goals Addressed             This Visit's Progress    Maintain My Quality of Life r/t Chronic Pancreatitis       Timeframe:  Long-Range Goal Priority:  High Start Date:     09/28/2020                        Expected End Date:     03/31/2021                  Follow Up Date- 03/05/2021   - call RN care manager at [phone] if you have any questions about completing HCPOA and living will - do one enjoyable thing every day- hobbies, friends, etc. - spend time outdoors at least 3 times a week as you  are able, continue walking and lifting hand weights -keep stress under control - call your primary or GI doctor early on if not feeling well or symptoms flare up - avoid spicy and fried foods - take all medication as prescribed - follow up with primary care provider 02/25/21   Why is this important?   Having a long-term illness can be scary.  It can also be stressful for you and your caregiver.  These steps may help.    Notes:      Monitor and Manage My Blood Sugar-Diabetes Type 2       Timeframe:  Long-Range Goal Priority:  Medium Start Date:        09/28/2020                     Expected End Date:     03/31/2021                  Follow Up Date- 03/05/2021   - continue checking blood sugar at prescribed times (twice daily) and record in log - check blood sugar if I feel it is too high or too low - take the blood sugar log and glucometer to all doctor visits - continue to watch carbohydrate intake with foods such as bread, pasta, rice and potatoes as these can elevate blood sugar - avoid concentrated sweets/ simple carbohydrates - follow up with primary care provider 12/8 and [name] 10/7 (skin lesion)     Why is this important?   Checking your blood sugar at home helps to keep it from getting very high or very low.  Writing the results in a diary or log helps the doctor know how to care for you.  Your blood sugar log should have the  time, date and the results.  Also, write down the amount of insulin or other medicine that you take.  Other information, like what you ate, exercise done and how you were feeling, will also be helpful.     Notes:         The patient verbalized understanding of instructions, educational materials, and care plan provided today and declined offer to receive copy of patient instructions, educational materials, and care plan.   Telephone follow up appointment with care management team member scheduled for:   03/05/2021  Jacqlyn Larsen [facility], BSN RN Case Manager [place] [facility] [phone]

## 2020-12-28 ENCOUNTER — Other Ambulatory Visit: Payer: Self-pay | Admitting: Gastroenterology

## 2020-12-30 ENCOUNTER — Other Ambulatory Visit: Payer: Self-pay

## 2020-12-30 ENCOUNTER — Ambulatory Visit (INDEPENDENT_AMBULATORY_CARE_PROVIDER_SITE_OTHER): Payer: Medicare Other | Admitting: Dermatology

## 2020-12-30 ENCOUNTER — Encounter: Payer: Self-pay | Admitting: Dermatology

## 2020-12-30 DIAGNOSIS — L820 Inflamed seborrheic keratosis: Secondary | ICD-10-CM | POA: Diagnosis not present

## 2020-12-30 DIAGNOSIS — L578 Other skin changes due to chronic exposure to nonionizing radiation: Secondary | ICD-10-CM | POA: Diagnosis not present

## 2020-12-30 NOTE — Patient Instructions (Addendum)
If you have any questions or concerns for your doctor, please call our main line at [phone] and press option 4 to reach your doctor's medical assistant. If no one answers, please leave a voicemail as directed and we will return your call as soon as possible. Messages left after 4 pm will be answered the following business day.   You may also send [place] a message via [place]. We typically respond to MyChart messages within 1-2 business days.  For prescription refills, please ask your pharmacy to contact our office. Our fax number is [phone].  If you have an urgent issue when the clinic is closed that cannot wait until the next business day, you can page your doctor at the number below.    Please note that while we do our best to be available for urgent issues outside of office hours, we are not available 24/7.   If you have an urgent issue and are unable to reach [place], you may choose to seek medical care at your doctor's office, retail clinic, urgent care center, or emergency room.  If you have a medical emergency, please immediately call 911 or go to the emergency department.  Pager Numbers  - Dr. Nehemiah Massed: [phone]  - Dr. Laurence Ferrari: [phone]  - Dr. Nicole Kindred: [phone]  In the event of inclement weather, please call our main line at [phone] for an update on the status of any delays or closures.  Dermatology Medication Tips: Please keep the boxes that topical medications come in in order to help keep track of the instructions about where and how to use these. Pharmacies typically print the medication instructions only on the boxes and not directly on the medication tubes.   If your medication is too expensive, please contact our office at [phone] option 4 or send [place] a message through [place].   We are unable to tell what your co-pay for medications will be in advance as this is different depending on your insurance coverage. However, we may be able to find a substitute  medication at lower cost or fill out paperwork to get insurance to cover a needed medication.   If a prior authorization is required to get your medication covered by your insurance company, please allow [place] 1-2 business days to complete this process.  Drug prices often vary depending on where the prescription is filled and some pharmacies may offer cheaper prices.  The website www.goodrx.com contains coupons for medications through different pharmacies. The prices here do not account for what the cost may be with help from insurance (it may be cheaper with your insurance), but the website can give you the price if you did not use any insurance.  - You can print the associated coupon and take it with your prescription to the pharmacy.  - You may also stop by our office during regular business hours and pick up a GoodRx coupon card.  - If you need your prescription sent electronically to a different pharmacy, notify our office through [facility] or by phone at [phone] option 4.   Seborrheic Keratosis  What causes seborrheic keratoses? Seborrheic keratoses are harmless, common skin growths that first appear during adult life.  As time goes by, more growths appear.  Some people may develop a large number of them.  Seborrheic keratoses appear on both covered and uncovered body parts.  They are not caused by sunlight.  The tendency to develop seborrheic keratoses can be inherited.  They vary in color from skin-colored to  gray, brown, or even black.  They can be either smooth or have a rough, warty surface.   Seborrheic keratoses are superficial and look as if they were stuck on the skin.  Under the microscope this type of keratosis looks like layers upon layers of skin.  That is why at times the top layer may seem to fall off, but the rest of the growth remains and re-grows.    Treatment Seborrheic keratoses do not need to be treated, but can easily be removed in the office.  Seborrheic  keratoses often cause symptoms when they rub on clothing or jewelry.  Lesions can be in the way of shaving.  If they become inflamed, they can cause itching, soreness, or burning.  Removal of a seborrheic keratosis can be accomplished by freezing, burning, or surgery. If any spot bleeds, scabs, or grows rapidly, please return to have it checked, as these can be an indication of a skin cancer.  Cryotherapy Aftercare  Wash gently with soap and water everyday.   Apply Vaseline and Band-Aid daily until healed.

## 2020-12-30 NOTE — Progress Notes (Signed)
   New Patient Visit  Subjective  Jennifer Porter is a 75 y.o. female who presents for the following: check spot (Face, ~42yr, hx of itching).  New patient referral from [name], NP.  Pt accompanied by husband who contributes to history.  The following portions of the chart were reviewed this encounter and updated as appropriate:   Tobacco  Allergies  Meds  Problems  Med Hx  Surg Hx  Fam Hx     Review of Systems:  No other skin or systemic complaints except as noted in HPI or Assessment and Plan.  Objective  Well appearing patient in no apparent distress; mood and affect are within normal limits.  A focused examination was performed including face. Relevant physical exam findings are noted in the Assessment and Plan.  R cheek x 1, L nose x 1 Total = 2 (2) 1.0cm brown plaque R cheek 0.3cm stuck on waxy pap with erythema L nose   Assessment & Plan  Inflamed seborrheic keratosis R cheek x 1, L nose x 1 Total = 2  Destruction of lesion - R cheek x 1, L nose x 1 Total = 2 Complexity: simple   Destruction method: cryotherapy   Informed consent: discussed and consent obtained   Timeout:  patient name, date of birth, surgical site, and procedure verified Lesion destroyed using liquid nitrogen: Yes   Region frozen until ice ball extended beyond lesion: Yes   Outcome: patient tolerated procedure well with no complications   Post-procedure details: wound care instructions given    Actinic Damage - chronic, secondary to cumulative UV radiation exposure/sun exposure over time - diffuse scaly erythematous macules with underlying dyspigmentation - Recommend daily broad spectrum sunscreen SPF 30+ to sun-exposed areas, reapply every 2 hours as needed.  - Recommend staying in the shade or wearing long sleeves, sun glasses (UVA+UVB protection) and wide brim hats (4-inch brim around the entire circumference of the hat). - Call for new or changing lesions.  Return in about 3  months (around 04/01/2021) for recheck ISKs.  I, Jennifer Porter, RMA, am acting as scribe for [name], MD . Documentation: I have reviewed the above documentation for accuracy and completeness, and I agree with the above.  [name], MD

## 2021-01-11 ENCOUNTER — Other Ambulatory Visit: Payer: Self-pay | Admitting: Nurse Practitioner

## 2021-01-11 DIAGNOSIS — E785 Hyperlipidemia, unspecified: Secondary | ICD-10-CM

## 2021-01-18 DIAGNOSIS — Z794 Long term (current) use of insulin: Secondary | ICD-10-CM

## 2021-01-18 DIAGNOSIS — E1169 Type 2 diabetes mellitus with other specified complication: Secondary | ICD-10-CM | POA: Diagnosis not present

## 2021-01-26 ENCOUNTER — Other Ambulatory Visit: Payer: Self-pay | Admitting: Nurse Practitioner

## 2021-01-26 DIAGNOSIS — E1169 Type 2 diabetes mellitus with other specified complication: Secondary | ICD-10-CM

## 2021-01-26 DIAGNOSIS — Z794 Long term (current) use of insulin: Secondary | ICD-10-CM

## 2021-01-28 ENCOUNTER — Other Ambulatory Visit: Payer: Self-pay | Admitting: Nurse Practitioner

## 2021-01-28 DIAGNOSIS — I10 Essential (primary) hypertension: Secondary | ICD-10-CM

## 2021-02-09 ENCOUNTER — Other Ambulatory Visit: Payer: Self-pay | Admitting: Nurse Practitioner

## 2021-02-09 DIAGNOSIS — E1169 Type 2 diabetes mellitus with other specified complication: Secondary | ICD-10-CM

## 2021-02-09 DIAGNOSIS — Z794 Long term (current) use of insulin: Secondary | ICD-10-CM

## 2021-02-23 ENCOUNTER — Other Ambulatory Visit: Payer: Medicare Other

## 2021-02-23 ENCOUNTER — Other Ambulatory Visit: Payer: Self-pay

## 2021-02-25 ENCOUNTER — Ambulatory Visit: Payer: Medicare Other | Admitting: Nurse Practitioner

## 2021-03-01 DIAGNOSIS — M1712 Unilateral primary osteoarthritis, left knee: Secondary | ICD-10-CM | POA: Diagnosis not present

## 2021-03-05 ENCOUNTER — Ambulatory Visit: Payer: Medicare Other | Admitting: *Deleted

## 2021-03-05 DIAGNOSIS — E1169 Type 2 diabetes mellitus with other specified complication: Secondary | ICD-10-CM

## 2021-03-05 DIAGNOSIS — K861 Other chronic pancreatitis: Secondary | ICD-10-CM

## 2021-03-05 NOTE — Patient Instructions (Signed)
Visit Information  Thank you for taking time to visit with me today. Please don't hesitate to contact me if I can be of assistance to you before our next scheduled telephone appointment.  Following are the goals we discussed today:  Take medications as prescribed   Attend all scheduled provider appointments Perform all self care activities independently  Perform IADL's (shopping, preparing meals, housekeeping, managing finances) independently Call provider office for new concerns or questions  check blood sugar at prescribed times: twice daily check feet daily for cuts, sores or redness take the blood sugar log to all doctor visits take the blood sugar meter to all doctor visits trim toenails straight across fill half of plate with vegetables manage portion size Continue to avoid fried foods, spicy foods Continue to exercise Call RN care manager if any questions at [phone]  Our next appointment is  on 05/24/2021 at 9 am  Please call the care guide team at [phone] if you need to cancel or reschedule your appointment.   If you are experiencing a Mental Health or [place] or need someone to talk to, please call 1-800-273-TALK (toll free, 24 hour hotline) go to [facility] [address] [place] [phone]) call 911   The patient verbalized understanding of instructions, educational materials, and care plan provided today and declined offer to receive copy of patient instructions, educational materials, and care plan.   Jacqlyn Larsen RNC, BSN RN Case Manager [place] [facility] [phone]

## 2021-03-05 NOTE — Chronic Care Management (AMB) (Signed)
Chronic Care Management   CCM RN Visit Note  03/05/2021 Name: Jennifer Porter MRN: [id number] DOB: 09-13-1945  Subjective: Jennifer Porter is a 75 y.o. year old female who is a primary care patient of Eulogio Bear, NP. The care management team was consulted for assistance with disease management and care coordination needs.    Engaged with patient by telephone for follow up visit in response to provider referral for case management and/or care coordination services.   Consent to Services:  The patient was given information about Chronic Care Management services, agreed to services, and gave verbal consent prior to initiation of services.  Please see initial visit note for detailed documentation.   Patient agreed to services and verbal consent obtained.   Assessment: Review of patient past medical history, allergies, medications, health status, including review of consultants reports, laboratory and other test data, was performed as part of comprehensive evaluation and provision of chronic care management services.   SDOH (Social Determinants of Health) assessments and interventions performed:    CCM Care Plan  Allergies  Allergen Reactions   Codeine Nausea And Vomiting   Jardiance [Empagliflozin]    Macrobid [Nitrofurantoin] Hives   Simvastatin     headache    Outpatient Encounter Medications as of 03/05/2021  Medication Sig   aspirin EC 81 MG tablet Take 81 mg by mouth daily.   BD PEN NEEDLE NANO U/F 32G X 4 MM MISC USE UP TO TWICE DAILY   Cholecalciferol (VITAMIN D3) 125 MCG (5000 UT) TABS Take 5,000 Units by mouth daily.   Choline Fenofibrate (FENOFIBRIC ACID) 135 MG CPDR TAKE ONE CAPSULE BY MOUTH EVERY NIGHT AT BEDTIME   CREON 36000-114000 units CPEP capsule TAKE TWO CAPSULES BY MOUTH WITH MEALS AND ONE WITH SNACKS (UP TO 8 A DAY)   Diclofenac Sodium CR 100 MG 24 hr tablet Take 1 tablet (100 mg total) by mouth daily as needed for pain.   dicyclomine (BENTYL) 10 MG  capsule Take one capsule up to twice daily for abdominal cramping or loose stools.   estradiol (ESTRACE) 0.5 MG tablet TAKE ONE TABLET BY MOUTH DAILY   fluticasone (FLONASE) 50 MCG/ACT nasal spray Place 2 sprays into both nostrils daily as needed.   gabapentin (NEURONTIN) 300 MG capsule TAKE 1 CAPSULE BY MOUTH THREE TIMES A DAY   LEVEMIR FLEXTOUCH 100 UNIT/ML FlexTouch Pen INJECT 5 TO 50 UNITS INTO THE SKIN AT BEDTIME NIGHTLY   lisinopril (ZESTRIL) 20 MG tablet TAKE ONE (1) TABLET BY MOUTH EVERY DAY   Magnesium Oxide, Elemental, 400 MG TABS Take 1 tablet by mouth in the morning and at bedtime. (Patient taking differently: Take 1 tablet by mouth in the morning and at bedtime. Takes BID per pt)   metFORMIN (GLUCOPHAGE) 500 MG tablet Take 2 tablets (1,000 mg total) by mouth 2 (two) times daily with a meal. TAKE TWO (2) TABLETS BY MOUTH 2 TIMES DAILY WITH A MEAL   pantoprazole (PROTONIX) 40 MG tablet Take one tablet once or twice daily, 30 minutes before a meal. (Patient taking differently: Take 40 mg by mouth daily. Take one tablet once or twice daily, 30 minutes before a meal.)   pravastatin (PRAVACHOL) 10 MG tablet TAKE ONE TABLET (10MG  TOTAL) BY MOUTH ATBEDTIME   Probiotic Product (PROBIOTIC DAILY PO) Take by mouth daily as needed.    sodium chloride (OCEAN) 0.65 % SOLN nasal spray Place 1 spray into both nostrils as needed for congestion.   mometasone (ELOCON) 0.1 %  cream Apply 1 application topically daily. (Patient not taking: Reported on 11/09/2020)   No facility-administered encounter medications on file as of 03/05/2021.    Patient Active Problem List   Diagnosis Date Noted   Hyperlipidemia associated with type 2 diabetes mellitus ([place]) 11/28/2020   Acute recurrent pansinusitis 07/20/2020   Urinary retention 01/15/2020   Urinary urgency 01/15/2020   Abdominal cramping 08/21/2019   Protein-calorie malnutrition ([place]) 02/18/2019   Chronic pancreatitis ([place]) 08/24/2018   Loose stools  08/24/2018   Pancreatic calcification 08/22/2018   Acute pancreatitis without infection or necrosis 08/09/2018   Sigmoid diverticulosis 08/09/2018   3-vessel CAD 08/09/2018   Aortic atherosclerosis ([place]) 08/09/2018   Myelolipoma of right adrenal gland 08/09/2018   Renal cyst, right 08/09/2018   Hormone replacement therapy 01/12/2017   Hot flashes due to menopause 01/12/2017   Hypomagnesemia 06/27/2016   Diarrhea 03/28/2016   Vitamin D deficiency 10/03/2014   Left knee pain 07/02/2014   Hypertension    Gastroesophageal reflux disease    Type 2 diabetes mellitus with other specified complication ([place])    Allergy    Asthma    H/O: gout    Hyperlipidemia     Conditions to be addressed/monitored:DMII and Pancreatitis  Care Plan : RN Care Manager Plan of Care  Updates made by [name], RN since 03/05/2021 12:00 AM     Problem: No plan of care established for management of chronic disease states  (DM2, Pancreatitis, HTN)   Priority: High     Long-Range Goal: Development of plan of care for chronic disease management  (DM2, Pancreatitis, HTN)   Start Date: 03/05/2021  Expected End Date: 09/01/2021  Priority: High  Note:   Current Barriers:  Knowledge Deficits related to plan of care for management of DMII and Pancreatitis  Ineffective Self Health Maintenance in a patient with Chronic Pancreatitis- pt needs reinforcement of management of chronic pancreatitis for optimal quality of life.  Pt reports she lives with spouse (and he has health issues as well), she is independent with ADL/ IADL's, continues to drive, leaves home for church, to meet with friends and other activities as often as she can, walks at times and lifts light hand weights at times as she is able, reports has had problems with left knee and received steroid injection recently which has helped.  Pt reports her blood pressure is under good control and pt checks BP on occasion.  Pt reports living with pancreatitis  can be difficult at times as "you never know when it may flare up". Pt reports she knows strategies to help keep it under control such as avoiding spicy and fried foods and taking medication as prescribed, calling her doctor as needed. Knowledge Deficits related to basic Diabetes pathophysiology and self care/management- needs reinforcement of ADA/ carbohydrate modified diet, importance of good blood sugar control, pt reports she checks CBG BID with fasting readings 90-120 range with today's reading 120, random evening readings 120-180 range with occasional reading near 180 per pt.  RNCM Clinical Goal(s):  Patient will verbalize understanding of plan for management of DMII and Pancreatitis as evidenced by patient report, review of EHR and  through collaboration with RN Care manager, provider, and care team.   Interventions: 1:1 collaboration with primary care provider regarding development and update of comprehensive plan of care as evidenced by provider attestation and co-signature Inter-disciplinary care team collaboration (see longitudinal plan of care) Evaluation of current treatment plan related to  self management and patient's adherence  to plan as established by provider   Diabetes Interventions:  (Status:  Goal on track:  Yes.) Long Term Goal Assessed patient's understanding of A1c goal: <7% Reviewed medications with patient and discussed importance of medication adherence Counseled on importance of regular laboratory monitoring as prescribed Discussed plans with patient for ongoing care management follow up and provided patient with direct contact information for care management team Review of patient status, including review of consultants reports, relevant laboratory and other test results, and medications completed Reinforced carbohydrate modified diet Reviewed CBG log with pt Advanced directives packet previously mailed to patient (have not been completed) Lab Results  Component  Value Date   HGBA1C 6.5 (H) 11/24/2020   Pancreatitis Interventions:  (Status:  Goal on track:  Yes.)  Long Term Goal Evaluation of current treatment plan related to  Pancreatitis ,  self-management and patient's adherence to plan as established by provider. Discussed plans with patient for ongoing care management follow up and provided patient with direct contact information for care management team Evaluation of current treatment plan related to Pancreatitis and patient's adherence to plan as established by provider  Reviewed importance of avoiding spicy foods Reviewed importance of taking medications as prescribed Reviewed upcoming scheduled appointments with pt  Patient Goals/Self-Care Activities: Take medications as prescribed   Attend all scheduled provider appointments Perform all self care activities independently  Perform IADL's (shopping, preparing meals, housekeeping, managing finances) independently Call provider office for new concerns or questions  check blood sugar at prescribed times: twice daily check feet daily for cuts, sores or redness take the blood sugar log to all doctor visits take the blood sugar meter to all doctor visits trim toenails straight across fill half of plate with vegetables manage portion size Continue to avoid fried foods, spicy foods Continue to exercise Call RN care manager if any questions at [phone]  Follow Up Plan:  Telephone follow up appointment with care management team member scheduled for:  05/24/2021      Plan:Telephone follow up appointment with care management team member scheduled for:  05/24/2021  [facility], BSN RN Case Manager [facility] [phone]

## 2021-03-23 ENCOUNTER — Other Ambulatory Visit: Payer: Self-pay | Admitting: Nurse Practitioner

## 2021-03-29 ENCOUNTER — Ambulatory Visit: Payer: Medicare PPO | Admitting: Nurse Practitioner

## 2021-03-29 ENCOUNTER — Encounter: Payer: Self-pay | Admitting: Nurse Practitioner

## 2021-03-29 ENCOUNTER — Other Ambulatory Visit: Payer: Self-pay

## 2021-03-29 VITALS — BP 144/74 | Ht 65.0 in | Wt 124.0 lb

## 2021-03-29 DIAGNOSIS — I700 Atherosclerosis of aorta: Secondary | ICD-10-CM | POA: Diagnosis not present

## 2021-03-29 DIAGNOSIS — I10 Essential (primary) hypertension: Secondary | ICD-10-CM | POA: Diagnosis not present

## 2021-03-29 DIAGNOSIS — B9689 Other specified bacterial agents as the cause of diseases classified elsewhere: Secondary | ICD-10-CM

## 2021-03-29 DIAGNOSIS — E1169 Type 2 diabetes mellitus with other specified complication: Secondary | ICD-10-CM | POA: Diagnosis not present

## 2021-03-29 DIAGNOSIS — E785 Hyperlipidemia, unspecified: Secondary | ICD-10-CM | POA: Diagnosis not present

## 2021-03-29 DIAGNOSIS — J019 Acute sinusitis, unspecified: Secondary | ICD-10-CM

## 2021-03-29 DIAGNOSIS — Z794 Long term (current) use of insulin: Secondary | ICD-10-CM

## 2021-03-29 DIAGNOSIS — N951 Menopausal and female climacteric states: Secondary | ICD-10-CM

## 2021-03-29 MED ORDER — AMOXICILLIN-POT CLAVULANATE 875-125 MG PO TABS: 1.0000 | ORAL_TABLET | Freq: Two times a day (BID) | ORAL | 0 refills | Status: AC

## 2021-03-29 MED ORDER — GABAPENTIN 300 MG PO CAPS: 300.0000 mg | ORAL_CAPSULE | Freq: Every day | ORAL | 1 refills | Status: AC

## 2021-03-29 MED ORDER — FENOFIBRIC ACID 135 MG PO CPDR: 1.0000 | DELAYED_RELEASE_CAPSULE | Freq: Every day | ORAL | 2 refills | Status: AC

## 2021-03-29 NOTE — Progress Notes (Signed)
Subjective:    Patient ID: Jennifer Porter, female    DOB: April 08, 1945, 76 y.o.   MRN: [id number]  HPI: Jennifer Porter is a 76 y.o. female presenting for follow up.  Chief Complaint  Patient presents with   Diabetes   DIABETES Currently taking metformin 1000 mg twice daily and levemir 5 units nightly.   Hypoglycemic episodes: yes; rarely.  Usually during the day if she has not eaten enough.  She can feel it "coming on" and eats a cracker which improves symptoms.  Polydipsia/polyuria: no Visual disturbance: no Chest pain: no Paresthesias:  yes; controlled with gabapentin Glucose Monitoring: yes  Accucheck frequency: multiple times daily  Fasting glucose: less than 120  Evening: around 150, one reading higher than 200  Taking Insulin?: yes  Long acting insulin: 5 units at night time Retinal Examination: Not up to Date; she will reach out to schedule and declines new referral today Foot Exam: Up to Date Diabetic Education: Completed Pneumovax: Up to Date Influenza: Up to Date Aspirin: yes Statin: yes, pravastatin 10 mg daily Dietary habits: tries to watch intake of carbohydrates and fats A1c goal: less than 7.5%  HYPERTENSION / HYPERLIPIDEMIA Currently taking lisinopril 20 mg daily, pravastatin 10 mg daily. BP monitoring frequency: daily Home BP: less than 120/50-60 Aspirin: yes Recent stressors: yes Recurrent headaches:  yes; related to sinus infection Visual changes: no Palpitations: no Dyspnea: no Chest pain: no Lower extremity edema: no Dizzy/lightheaded: no Myalgias: no LDL goal: less than 70 BP goal: less than 140/90 The 10-year ASCVD risk score (Arnett DK, et al., 2019) is: 46.9%   Values used to calculate the score:     Age: 8 years     Sex: Female     Is Non-Hispanic African American: No     Diabetic: Yes     Tobacco smoker: No     Systolic Blood Pressure: 188 mmHg     Is BP treated: Yes     HDL Cholesterol: 50 mg/dL     Total Cholesterol: 134  mg/dL  UPPER RESPIRATORY TRACT INFECTION Onset: weeks Fever: no Cough: no Shortness of breath: no Wheezing: no Chest pain: no Chest tightness: no Chest congestion: no Nasal congestion: yes Runny nose: yes Post nasal drip: yes Sneezing: no Sore throat: yes; feels scratchy Swollen glands: no Sinus pressure: yes Headache: no Face pain: no Toothache: yes Ear pain: yes; had dark yellow drainage last week Ear pressure: no  Eyes red/itching:no Eye drainage/crusting: no  Nausea: no  Vomiting: no Diarrhea: no  Change in appetite:  yes; decreased   Loss of taste/smell: no  Rash: no Fatigue: yes Sick contacts: no Strep contacts: no  Context: stable Recurrent sinusitis: no Treatments attempted: flonase, time Relief with OTC medications:  Reports she recently lost twin brother right before christmas.  She is coping okay with it. Knows he is with God.  She feels well supported, she has church and her family.   Also reports she tried decreasing estrace.  Hot flashes returned, so she kept taking estrace 0.5 mg daily.   Allergies  Allergen Reactions   Codeine Nausea And Vomiting   Jardiance [Empagliflozin]    Macrobid [Nitrofurantoin] Hives   Simvastatin     headache    Outpatient Encounter Medications as of 03/29/2021  Medication Sig   amoxicillin-clavulanate (AUGMENTIN) 875-125 MG tablet Take 1 tablet by mouth 2 (two) times daily for 7 days.   aspirin EC 81 MG tablet Take 81 mg by mouth  daily.   BD PEN NEEDLE NANO U/F 32G X 4 MM MISC USE UP TO TWICE DAILY   Cholecalciferol (VITAMIN D3) 125 MCG (5000 UT) TABS Take 5,000 Units by mouth daily.   CREON 36000-114000 units CPEP capsule TAKE TWO CAPSULES BY MOUTH WITH MEALS AND ONE WITH SNACKS (UP TO 8 A DAY)   Diclofenac Sodium CR 100 MG 24 hr tablet Take 1 tablet (100 mg total) by mouth daily as needed for pain.   dicyclomine (BENTYL) 10 MG capsule Take one capsule up to twice daily for abdominal cramping or loose stools.    estradiol (ESTRACE) 0.5 MG tablet TAKE ONE TABLET BY MOUTH DAILY   fluticasone (FLONASE) 50 MCG/ACT nasal spray Place 2 sprays into both nostrils daily as needed.   LEVEMIR FLEXTOUCH 100 UNIT/ML FlexTouch Pen INJECT 5 TO 50 UNITS INTO THE SKIN AT BEDTIME NIGHTLY   lisinopril (ZESTRIL) 20 MG tablet TAKE ONE (1) TABLET BY MOUTH EVERY DAY   Magnesium Oxide, Elemental, 400 MG TABS Take 1 tablet by mouth in the morning and at bedtime. (Patient taking differently: Take 1 tablet by mouth in the morning and at bedtime. Takes BID per pt)   metFORMIN (GLUCOPHAGE) 500 MG tablet Take 2 tablets (1,000 mg total) by mouth 2 (two) times daily with a meal. TAKE TWO (2) TABLETS BY MOUTH 2 TIMES DAILY WITH A MEAL   mometasone (ELOCON) 0.1 % cream Apply 1 application topically daily.   pantoprazole (PROTONIX) 40 MG tablet Take one tablet once or twice daily, 30 minutes before a meal. (Patient taking differently: Take 40 mg by mouth daily. Take one tablet once or twice daily, 30 minutes before a meal.)   pravastatin (PRAVACHOL) 10 MG tablet TAKE ONE TABLET (10MG  TOTAL) BY MOUTH ATBEDTIME   Probiotic Product (PROBIOTIC DAILY PO) Take by mouth daily as needed.    sodium chloride (OCEAN) 0.65 % SOLN nasal spray Place 1 spray into both nostrils as needed for congestion.   [DISCONTINUED] Choline Fenofibrate (FENOFIBRIC ACID) 135 MG CPDR TAKE ONE CAPSULE BY MOUTH EVERY NIGHT AT BEDTIME   [DISCONTINUED] gabapentin (NEURONTIN) 300 MG capsule TAKE 1 CAPSULE BY MOUTH THREE TIMES A DAY   Choline Fenofibrate (FENOFIBRIC ACID) 135 MG CPDR Take 1 capsule by mouth at bedtime.   gabapentin (NEURONTIN) 300 MG capsule Take 1 capsule (300 mg total) by mouth at bedtime.   No facility-administered encounter medications on file as of 03/29/2021.    Patient Active Problem List   Diagnosis Date Noted   Hyperlipidemia associated with type 2 diabetes mellitus ([place]) 11/28/2020   Acute recurrent pansinusitis 07/20/2020   Urinary retention  01/15/2020   Urinary urgency 01/15/2020   Abdominal cramping 08/21/2019   Protein-calorie malnutrition ([place]) 02/18/2019   Chronic pancreatitis ([place]) 08/24/2018   Loose stools 08/24/2018   Pancreatic calcification 08/22/2018   Acute pancreatitis without infection or necrosis 08/09/2018   Sigmoid diverticulosis 08/09/2018   3-vessel CAD 08/09/2018   Aortic atherosclerosis ([place]) 08/09/2018   Myelolipoma of right adrenal gland 08/09/2018   Renal cyst, right 08/09/2018   Hormone replacement therapy 01/12/2017   Hot flashes due to menopause 01/12/2017   Hypomagnesemia 06/27/2016   Diarrhea 03/28/2016   Vitamin D deficiency 10/03/2014   Left knee pain 07/02/2014   Hypertension    Gastroesophageal reflux disease    Type 2 diabetes mellitus with other specified complication ([employer])    Allergy    Asthma    H/O: gout    Hyperlipidemia     Past  Medical History:  Diagnosis Date   Allergy    seasonal   Asthma    Complication of anesthesia    Diabetes mellitus without complication (HCC)    GERD (gastroesophageal reflux disease)    hiatal hernia   H/O: gout    Hyperlipidemia    Hypertension    Pancreatitis    PONV (postoperative nausea and vomiting)     Relevant past medical, surgical, family and social history reviewed and updated as indicated. Interim medical history since our last visit reviewed.  Review of Systems Per HPI unless specifically indicated above     Objective:    BP (!) 144/74    Ht 5\' 5"  (1.651 m)    Wt 124 lb (56.2 kg)    BMI 20.63 kg/m   Wt Readings from Last 3 Encounters:  03/29/21 124 lb (56.2 kg)  11/26/20 126 lb (57.2 kg)  07/20/20 124 lb (56.2 kg)    Physical Exam Vitals and nursing note reviewed.  Constitutional:      General: She is not in acute distress.    Appearance: Normal appearance. She is not toxic-appearing.  HENT:     Head: Normocephalic and atraumatic.     Right Ear: Tympanic membrane, ear canal and external ear normal.     Left  Ear: Tympanic membrane, ear canal and external ear normal.     Nose: Congestion present. No rhinorrhea.     Right Sinus: Maxillary sinus tenderness and frontal sinus tenderness present.     Left Sinus: Maxillary sinus tenderness and frontal sinus tenderness present.     Mouth/Throat:     Mouth: Mucous membranes are moist.     Pharynx: Oropharynx is clear. No oropharyngeal exudate or posterior oropharyngeal erythema.  Eyes:     General: No scleral icterus.    Extraocular Movements: Extraocular movements intact.  Cardiovascular:     Rate and Rhythm: Normal rate and regular rhythm.     Heart sounds: Normal heart sounds. No murmur heard. Pulmonary:     Effort: Pulmonary effort is normal. No respiratory distress.     Breath sounds: Normal breath sounds. No wheezing, rhonchi or rales.  Abdominal:     General: Abdomen is flat. Bowel sounds are normal.     Palpations: Abdomen is soft.     Tenderness: There is no abdominal tenderness.  Musculoskeletal:     Right lower leg: No edema.     Left lower leg: No edema.  Skin:    General: Skin is warm and dry.     Capillary Refill: Capillary refill takes less than 2 seconds.     Coloration: Skin is not jaundiced.     Findings: No bruising.  Neurological:     Mental Status: She is alert and oriented to person, place, and time.     Motor: No weakness.     Gait: Gait normal.  Psychiatric:        Mood and Affect: Mood normal.        Behavior: Behavior normal.        Thought Content: Thought content normal.        Judgment: Judgment normal.      Assessment & Plan:   Problem List Items Addressed This Visit       Cardiovascular and Mediastinum   Hypertension    Chronic.  BP is stable today.  Goal BP less than 140/90.  Continue lisinopril 20 mg daily.  Continue monitoring BP at home and notify with readings >140/90.  Follow up 4 months with new pcp.      Relevant Medications   Choline Fenofibrate (FENOFIBRIC ACID) 135 MG CPDR   Hot flashes  due to menopause    Chronic.  After our last visit, patient tried to decrease estrace further, however hot flashes returned.  She is taking estrace 0.5 mg daily.  Again, we discussed the risk of continue to take this medication given age, comorbid diseases, risk vs. Benefit was discussed and benefits outweigh risks for now.  Patient elects to continue hormone replacement therapy for now.  I did recommend she try slowly decreasing again in the next few months.  Follow up with new PCP       Relevant Medications   Choline Fenofibrate (FENOFIBRIC ACID) 135 MG CPDR   Aortic atherosclerosis (HCC)    Chronic.  Check fasting lipids today.  Continue pravastatin 10 mg daily and fenofibric acid 135 mg daily.  LDL goal less than 70, triglyceride goal less than 150.  Check liver enzymes today.  Follow up 4 months with new pcp.      Relevant Medications   Choline Fenofibrate (FENOFIBRIC ACID) 135 MG CPDR   Other Relevant Orders   Hemoglobin A1c   Magnesium   BASIC METABOLIC PANEL WITH GFR   Lipid Panel     Endocrine   Type 2 diabetes mellitus with other specified complication (HCC) - Primary    Chronic.  Check A1c today.  Goal A1c is less than 7.5%.  Blood sugars appear to be very well controlled at home.  Plan to continue Metformin 1000 mg in the morning, 500 mg in the afternoon and Levemir 5 units at night time.  Foot exam is up to date.  Needs to schedule eye exam and will reach out to my eye dr.  Patient is on a statin, LDL goal less than 70.  I recommended COVID-19 boosters.  She is up to date on flu and pneumonia vaccines.  She is going to check on shingles vaccine at pharmacy.  Follow up 4 months with new PCP.      Relevant Medications   gabapentin (NEURONTIN) 300 MG capsule   Other Relevant Orders   Hemoglobin A1c   Magnesium   BASIC METABOLIC PANEL WITH GFR   Hyperlipidemia associated with type 2 diabetes mellitus (HCC)    Chronic.  Check fasting lipids today.  Continue pravastatin 10 mg  daily and fenofibric acid 135 mg daily.  LDL goal less than 70, triglyceride goal less than 150.  Check liver enzymes today.  Follow up 4 months with new pcp.       Relevant Medications   Choline Fenofibrate (FENOFIBRIC ACID) 135 MG CPDR     Other   Hyperlipidemia    Chronic.  Check fasting lipids today.  Continue pravastatin 10 mg daily and fenofibric acid 135 mg daily.  LDL goal less than 70, triglyceride goal less than 150.  Check liver enzymes today.  Follow up 4 months with new pcp.       Relevant Medications   Choline Fenofibrate (FENOFIBRIC ACID) 135 MG CPDR   Other Visit Diagnoses     Acute bacterial sinusitis       Acute.  Start Augmentin twice daily for 7 days.  Continue nasal rinses, pushing hydration.  Follow up with no improvement.   Relevant Medications   amoxicillin-clavulanate (AUGMENTIN) 875-125 MG tablet        Follow up plan: Return in about 4 months (around 07/27/2021) for new PCP.

## 2021-03-29 NOTE — Assessment & Plan Note (Signed)
Chronic.  After our last visit, patient tried to decrease estrace further, however hot flashes returned.  She is taking estrace 0.5 mg daily.  Again, we discussed the risk of continue to take this medication given age, comorbid diseases, risk vs. Benefit was discussed and benefits outweigh risks for now.  Patient elects to continue hormone replacement therapy for now.  I did recommend she try slowly decreasing again in the next few months.  Follow up with new PCP

## 2021-03-29 NOTE — Assessment & Plan Note (Signed)
Chronic.  Check fasting lipids today.  Continue pravastatin 10 mg daily and fenofibric acid 135 mg daily.  LDL goal less than 70, triglyceride goal less than 150.  Check liver enzymes today.  Follow up 4 months with new pcp.

## 2021-03-29 NOTE — Assessment & Plan Note (Signed)
Chronic.  Check A1c today.  Goal A1c is less than 7.5%.  Blood sugars appear to be very well controlled at home.  Plan to continue Metformin 1000 mg in the morning, 500 mg in the afternoon and Levemir 5 units at night time.  Foot exam is up to date.  Needs to schedule eye exam and will reach out to my eye dr.  Patient is on a statin, LDL goal less than 70.  I recommended COVID-19 boosters.  She is up to date on flu and pneumonia vaccines.  She is going to check on shingles vaccine at pharmacy.  Follow up 4 months with new PCP.

## 2021-03-29 NOTE — Assessment & Plan Note (Addendum)
Chronic.  BP is stable today.  Goal BP less than 140/90.  Continue lisinopril 20 mg daily.  Continue monitoring BP at home and notify with readings >140/90.  Follow up 4 months with new pcp.

## 2021-03-30 LAB — BASIC METABOLIC PANEL WITH GFR
BUN: 24 mg/dL (ref 7–25)
CO2: 25 mmol/L (ref 20–32)
Calcium: 10.1 mg/dL (ref 8.6–10.4)
Chloride: 104 mmol/L (ref 98–110)
Creat: 0.7 mg/dL (ref 0.60–1.00)
Glucose, Bld: 92 mg/dL (ref 65–99)
Potassium: 4.4 mmol/L (ref 3.5–5.3)
Sodium: 142 mmol/L (ref 135–146)
eGFR: 90 mL/min/{1.73_m2} (ref >=60)

## 2021-03-30 LAB — LIPID PANEL
Cholesterol: 136 mg/dL (ref <200)
HDL: 46 mg/dL — ABNORMAL LOW (ref >=50)
LDL Cholesterol (Calc): 70 mg/dL (calc)
Non-HDL Cholesterol (Calc): 90 mg/dL (calc) (ref <130)
Total CHOL/HDL Ratio: 3 (calc) (ref <5.0)
Triglycerides: 116 mg/dL (ref <150)

## 2021-03-30 LAB — HEMOGLOBIN A1C
Hgb A1c MFr Bld: 6.6 % of total Hgb — ABNORMAL HIGH (ref <5.7)
Mean Plasma Glucose: 143 mg/dL
eAG (mmol/L): 7.9 mmol/L

## 2021-03-30 LAB — MAGNESIUM: Magnesium: 1.3 mg/dL — ABNORMAL LOW (ref 1.5–2.5)

## 2021-04-06 ENCOUNTER — Ambulatory Visit: Payer: Medicare PPO | Admitting: Dermatology

## 2021-04-06 ENCOUNTER — Other Ambulatory Visit: Payer: Self-pay

## 2021-04-06 ENCOUNTER — Ambulatory Visit: Payer: BC Managed Care – PPO | Admitting: Dermatology

## 2021-04-06 ENCOUNTER — Encounter: Payer: Self-pay | Admitting: Dermatology

## 2021-04-06 DIAGNOSIS — L820 Inflamed seborrheic keratosis: Secondary | ICD-10-CM | POA: Diagnosis not present

## 2021-04-06 DIAGNOSIS — L719 Rosacea, unspecified: Secondary | ICD-10-CM

## 2021-04-06 DIAGNOSIS — I781 Nevus, non-neoplastic: Secondary | ICD-10-CM

## 2021-04-06 NOTE — Patient Instructions (Signed)

## 2021-04-06 NOTE — Progress Notes (Signed)
° °  Follow-Up Visit   Subjective  Jennifer Porter is a 76 y.o. female who presents for the following: ISK recheck (Of the R cheek and L nose - still some residual per patient.).  The following portions of the chart were reviewed this encounter and updated as appropriate:   Tobacco   Allergies   Meds   Problems   Med Hx   Surg Hx   Fam Hx      Review of Systems:  No other skin or systemic complaints except as noted in HPI or Assessment and Plan.  Objective  Well appearing patient in no apparent distress; mood and affect are within normal limits.  A focused examination was performed including the face. Relevant physical exam findings are noted in the Assessment and Plan.  Face Dilated vessels of the face.  R cheek x 1, dorsum nose x 1 (2) Erythematous stuck-on, waxy papule or plaque   Assessment & Plan  Rosacea with telangiectasias R Cheek Face Rosacea is a chronic progressive skin condition usually affecting the face of adults, causing redness and/or acne bumps. It is treatable but not curable. It sometimes affects the eyes (ocular rosacea) as well. It may respond to topical and/or systemic medication and can flare with stress, sun exposure, alcohol, exercise and some foods.  Daily application of broad spectrum spf 30+ sunscreen to face is recommended to reduce flares. Pt declines treatment today.  Inflamed seborrheic keratosis (2) R cheek x 1, dorsum nose x 1  Destruction of lesion - R cheek x 1, dorsum nose x 1 Complexity: simple   Destruction method: cryotherapy   Informed consent: discussed and consent obtained   Timeout:  patient name, date of birth, surgical site, and procedure verified Lesion destroyed using liquid nitrogen: Yes   Region frozen until ice ball extended beyond lesion: Yes   Outcome: patient tolerated procedure well with no complications   Post-procedure details: wound care instructions given    Return in about 6 months (around 10/04/2021) for ISK f/u  .  Luther Redo, CMA, am acting as scribe for [name], MD . Documentation: I have reviewed the above documentation for accuracy and completeness, and I agree with the above.  [name], MD

## 2021-05-24 ENCOUNTER — Ambulatory Visit: Payer: Medicare Other | Admitting: *Deleted

## 2021-05-24 ENCOUNTER — Telehealth: Payer: Self-pay | Admitting: *Deleted

## 2021-05-24 DIAGNOSIS — J069 Acute upper respiratory infection, unspecified: Secondary | ICD-10-CM | POA: Diagnosis not present

## 2021-05-24 DIAGNOSIS — E119 Type 2 diabetes mellitus without complications: Secondary | ICD-10-CM | POA: Diagnosis not present

## 2021-05-24 NOTE — Patient Instructions (Signed)
Visit Information ? ?Thank you for taking time to visit with me today. Please don't hesitate to contact me if I can be of assistance to you before our next scheduled telephone appointment. ? ?Following are the goals we discussed today:  ?Take medications as prescribed   ?Attend all scheduled provider appointments ?Perform all self care activities independently  ?Perform IADL's (shopping, preparing meals, housekeeping, managing finances) independently ?Call provider office for new concerns or questions  ?check blood sugar at prescribed times: twice daily ?check feet daily for cuts, sores or redness ?enter blood sugar readings and medication or insulin into daily log ?take the blood sugar log to all doctor visits ?take the blood sugar meter to all doctor visits ?trim toenails straight across ?drink 6 to 8 glasses of water each day ?fill half of plate with vegetables ?manage portion size ?read food labels for fat, fiber, carbohydrates and portion size ?keep feet up while sitting ?wash and dry feet carefully every day ?wear comfortable, cotton socks ?wear comfortable, well-fitting shoes ?Continue to avoid fried foods, spicy foods ?Continue to exercise ?Case closure today due to your new doctor does not participate in CCM services ? ?Case closed no further follow up  ? ?Please call the care guide team at [phone] if you need to cancel or reschedule your appointment.  ? ?If you are experiencing a Mental Health or [place] or need someone to talk to, please call the Suicide and Crisis Lifeline: 988 ?call the [place] National Suicide Prevention Lifeline: 212-307-0020 or TTY: 325-712-3099 TTY 269-029-3203) to talk to a trained counselor ?call 1-800-273-TALK (toll free, 24 hour hotline) ?go to [facility] [address] [place] [phone]) ?call the [facility]: [phone] ?call 911  ? ?The patient verbalized understanding of instructions,  educational materials, and care plan provided today and declined offer to receive copy of patient instructions, educational materials, and care plan.  ? ?No further follow up required: case closed ? ?Jacqlyn Larsen RNC, BSN ?RN Case Manager ?[place] ?[phone] ? ?

## 2021-05-24 NOTE — Chronic Care Management (AMB) (Signed)
Care Management    RN Visit Note  05/24/2021 Name: Jennifer Porter MRN: [id number] DOB: 24-Jan-1946  Subjective: Jennifer Porter is a 76 y.o. year old female who is a primary care patient of [name], [name], MD. The care management team was consulted for assistance with disease management and care coordination needs.    Engaged with patient by telephone for follow up visit in response to provider referral for case management and/or care coordination services.   Consent to Services:   Ms. [name] was given information about Care Management services today including:  Care Management services includes personalized support from designated clinical staff supervised by her physician, including individualized plan of care and coordination with other care providers 24/7 contact phone numbers for assistance for urgent and routine care needs. The patient may stop case management services at any time by phone call to the office staff.  Patient agreed to services and consent obtained.   Assessment: Review of patient past medical history, allergies, medications, health status, including review of consultants reports, laboratory and other test data, was performed as part of comprehensive evaluation and provision of chronic care management services.   SDOH (Social Determinants of Health) assessments and interventions performed:    Care Plan  Allergies  Allergen Reactions   Codeine Nausea And Vomiting   Jardiance [Empagliflozin]    Macrobid [Nitrofurantoin] Hives   Simvastatin     headache    Outpatient Encounter Medications as of 05/24/2021  Medication Sig   aspirin EC 81 MG tablet Take 81 mg by mouth daily.   BD PEN NEEDLE NANO U/F 32G X 4 MM MISC USE UP TO TWICE DAILY   Cholecalciferol (VITAMIN D3) 125 MCG (5000 UT) TABS Take 5,000 Units by mouth daily.   Choline Fenofibrate (FENOFIBRIC ACID) 135 MG CPDR Take 1 capsule by mouth at bedtime.   CREON 36000-114000 units CPEP capsule TAKE TWO CAPSULES  BY MOUTH WITH MEALS AND ONE WITH SNACKS (UP TO 8 A DAY)   Diclofenac Sodium CR 100 MG 24 hr tablet Take 1 tablet (100 mg total) by mouth daily as needed for pain.   dicyclomine (BENTYL) 10 MG capsule Take one capsule up to twice daily for abdominal cramping or loose stools.   estradiol (ESTRACE) 0.5 MG tablet TAKE ONE TABLET BY MOUTH DAILY   fluticasone (FLONASE) 50 MCG/ACT nasal spray Place 2 sprays into both nostrils daily as needed.   gabapentin (NEURONTIN) 300 MG capsule Take 1 capsule (300 mg total) by mouth at bedtime.   LEVEMIR FLEXTOUCH 100 UNIT/ML FlexTouch Pen INJECT 5 TO 50 UNITS INTO THE SKIN AT BEDTIME NIGHTLY   lisinopril (ZESTRIL) 20 MG tablet TAKE ONE (1) TABLET BY MOUTH EVERY DAY   Magnesium Oxide, Elemental, 400 MG TABS Take 1 tablet by mouth in the morning and at bedtime. (Patient taking differently: Take 1 tablet by mouth in the morning and at bedtime. Takes BID per pt)   metFORMIN (GLUCOPHAGE) 500 MG tablet Take 2 tablets (1,000 mg total) by mouth 2 (two) times daily with a meal. TAKE TWO (2) TABLETS BY MOUTH 2 TIMES DAILY WITH A MEAL   mometasone (ELOCON) 0.1 % cream Apply 1 application topically daily.   pantoprazole (PROTONIX) 40 MG tablet Take one tablet once or twice daily, 30 minutes before a meal. (Patient taking differently: Take 40 mg by mouth daily. Take one tablet once or twice daily, 30 minutes before a meal.)   pravastatin (PRAVACHOL) 10 MG tablet TAKE ONE TABLET ('10MG'$   TOTAL) BY MOUTH ATBEDTIME   Probiotic Product (PROBIOTIC DAILY PO) Take by mouth daily as needed.    sodium chloride (OCEAN) 0.65 % SOLN nasal spray Place 1 spray into both nostrils as needed for congestion.   No facility-administered encounter medications on file as of 05/24/2021.    Patient Active Problem List   Diagnosis Date Noted   Hyperlipidemia associated with type 2 diabetes mellitus ([place]) 11/28/2020   Acute recurrent pansinusitis 07/20/2020   Urinary retention 01/15/2020   Urinary  urgency 01/15/2020   Abdominal cramping 08/21/2019   Protein-calorie malnutrition ([place]) 02/18/2019   Chronic pancreatitis ([place]) 08/24/2018   Loose stools 08/24/2018   Pancreatic calcification 08/22/2018   Acute pancreatitis without infection or necrosis 08/09/2018   Sigmoid diverticulosis 08/09/2018   3-vessel CAD 08/09/2018   Aortic atherosclerosis ([place]) 08/09/2018   Myelolipoma of right adrenal gland 08/09/2018   Renal cyst, right 08/09/2018   Hormone replacement therapy 01/12/2017   Hot flashes due to menopause 01/12/2017   Hypomagnesemia 06/27/2016   Diarrhea 03/28/2016   Vitamin D deficiency 10/03/2014   Left knee pain 07/02/2014   Hypertension    Gastroesophageal reflux disease    Type 2 diabetes mellitus with other specified complication ([place])    Allergy    Asthma    H/O: gout    Hyperlipidemia     Conditions to be addressed/monitored: HTN, DMII, and Pancreatitis  Care Plan : RN Care Manager Plan of Care  Updates made by [name], RN since 05/24/2021 12:00 AM  Completed 05/24/2021   Problem: No plan of care established for management of chronic disease states  (DM2, Pancreatitis, HTN) Resolved 05/24/2021  Priority: High     Long-Range Goal: Development of plan of care for chronic disease management  (DM2, Pancreatitis, HTN) Completed 05/24/2021  Start Date: 03/05/2021  Expected End Date: 09/01/2021  Priority: High  Note:   Resolved- case closed pt has new primary care provider - does not participate in CCM services Current Barriers:  Knowledge Deficits related to plan of care for management of DMII and Pancreatitis  Ineffective Self Health Maintenance in a patient with Chronic Pancreatitis- pt needs reinforcement of management of chronic pancreatitis for optimal quality of life.  Pt reports she lives with spouse (and he has health issues as well), she is independent with ADL/ IADL's, continues to drive, leaves home for church, to meet with friends and other  activities as often as she can, walks at times and lifts light hand weights at times as she is able, reports has had problems with left knee and received steroid injection recently which has helped.  Pt reports her blood pressure is under good control and pt checks BP on occasion.  Pt reports living with pancreatitis can be difficult at times as "you never know when it may flare up". Pt reports she knows strategies to help keep it under control such as avoiding spicy and fried foods and taking medication as prescribed, calling her doctor as needed. Knowledge Deficits related to basic Diabetes pathophysiology and self care/management- needs reinforcement of ADA/ carbohydrate modified diet, importance of good blood sugar control, pt reports she checks CBG BID with fasting readings 91-116 range, random evening readings 112-180 range. Patient reports she has new primary care provider Dr. Delphina Cahill in [place] (does not participate in CCM services) as [place] doctor left [place]. Patient reports she thinks she has a sinus infection and will be seeing her primary care provider today.   RNCM  Clinical Goal(s):  Patient will verbalize understanding of plan for management of DMII and Pancreatitis as evidenced by patient report, review of EHR and  through collaboration with RN Care manager, provider, and care team.   Interventions: 1:1 collaboration with primary care provider regarding development and update of comprehensive plan of care as evidenced by provider attestation and co-signature Inter-disciplinary care team collaboration (see longitudinal plan of care) Evaluation of current treatment plan related to  self management and patient's adherence to plan as established by provider   Diabetes Interventions:  (Status:  Goal on track:  Yes.) Long Term Goal Assessed patient's understanding of A1c goal: <7% Reviewed medications with patient and discussed importance of medication  adherence Counseled on importance of regular laboratory monitoring as prescribed Discussed plans with patient for ongoing care management follow up and provided patient with direct contact information for care management team Review of patient status, including review of consultants reports, relevant laboratory and other test results, and medications completed Reviewed carbohydrate modified diet Reviewed CBG log with pt Reviewed case closure today with pt as her new primary care provider does not participate with CCM services Lab Results  Component Value Date   HGBA1C 6.5 (H) 11/24/2020   Pancreatitis Interventions:  (Status:  Goal on track:  Yes.)  Long Term Goal Evaluation of current treatment plan related to  Pancreatitis ,  self-management and patient's adherence to plan as established by provider. Discussed plans with patient for ongoing care management follow up and provided patient with direct contact information for care management team Evaluation of current treatment plan related to Pancreatitis and patient's adherence to plan as established by provider  Reinforced importance of avoiding spicy foods Reviewed importance of taking medications as prescribed Reviewed upcoming scheduled appointments with pt  Patient Goals/Self-Care Activities: Take medications as prescribed   Attend all scheduled provider appointments Perform all self care activities independently  Perform IADL's (shopping, preparing meals, housekeeping, managing finances) independently Call provider office for new concerns or questions  check blood sugar at prescribed times: twice daily check feet daily for cuts, sores or redness enter blood sugar readings and medication or insulin into daily log take the blood sugar log to all doctor visits take the blood sugar meter to all doctor visits trim toenails straight across drink 6 to 8 glasses of water each day fill half of plate with vegetables manage portion  size read food labels for fat, fiber, carbohydrates and portion size keep feet up while sitting wash and dry feet carefully every day wear comfortable, cotton socks wear comfortable, well-fitting shoes Continue to avoid fried foods, spicy foods Continue to exercise Case closure today due to your new doctor does not participate in [place]: No further follow up required: patient has new primary care provider- does not participate in CCM services  Jacqlyn Larsen [facility], BSN RN Case Manager [place] [phone]

## 2021-05-24 NOTE — Telephone Encounter (Signed)
?  Care Management  ? ?Follow Up Note ? ? ?05/24/2021 ?Name: Jennifer Porter MRN: [id number] DOB: Mar 13, 1946 ? ? ?Referred by: Eulogio Bear, NP ?Reason for referral : Chronic Care Management (DM, HTN, pancreatitis) ? ? ?An unsuccessful telephone outreach was attempted today. The patient was referred to the case management team for assistance with care management and care coordination.  ? ?Follow Up Plan: Telephone follow up appointment with care management team member scheduled for: upon care guide rescheduling ? ?Jacqlyn Larsen [facility], BSN ?RN Case Manager ?[place] ?[phone] ? ? ?

## 2021-07-19 DIAGNOSIS — M1712 Unilateral primary osteoarthritis, left knee: Secondary | ICD-10-CM | POA: Diagnosis not present

## 2021-07-21 ENCOUNTER — Encounter: Payer: Self-pay | Admitting: Internal Medicine

## 2021-07-28 NOTE — Progress Notes (Signed)
? ? ?Referring Provider: Celene Squibb, MD ?Primary Care Physician:  Celene Squibb, MD ?Primary GI Physician: Dr. Gala Romney ? ?Chief Complaint  ?Patient presents with  ? Follow-up  ?  Stomach hurts still, diarrhea sometimes   ? ? ?HPI:   ?Jennifer Porter is a 76 y.o. female with history of GERD and chronic pancreatitis based on EUS/MRCP findings in 2020 with evidence of exocrine/endocrine deficiency on pancreatic enzymes. Etiology of chronic pancreatitis unknown. No FH of pancreatitis. No etoh history. EUS without evidence of biliary etiology, gallbladder has been removed. No pancreatic duct stone on MRCP.    ? ?Last seen via virtual visit in May 2022.  Reported she had a bad spell of abdominal pain over the weekend, but it had improved.  Otherwise, has been doing well.  Taking 2 Creon with meals and 1 with snacks.  Weight fairly stable between 120 and 125 pounds.  GERD controlled on Protonix daily.  She was not interested in a colonoscopy.  Reported her last colonoscopy was about 12 years ago in [place] without polyps.  Recommended continuing current medications and follow-up in 1 year. ? ?Today: ?Doing well overall.  Taking Creon 2 capsules with meals and 1 capsule with snacks and usually has 2 bowel movements daily.  Occasionally, she has breakthrough diarrhea and abdominal cramping for which she will take 1 dicyclomine and symptoms resolve. This is not often.  No specific triggers.  No trouble with dairy.  Avoids greasy foods.  Weight is stable. No brbpr or melena.  ? ?GERD fairly well controlled.  Takes Protonix once daily, but occasionally will take a second dose. No nausea, vomiting, or dysphagia.  ? ?Not interested in having another colonoscopy.  ? ?Past Medical History:  ?Diagnosis Date  ? Allergy   ? seasonal  ? Asthma   ? Chronic pancreatitis ([place])   ? Complication of anesthesia   ? Diabetes mellitus without complication ([place])   ? GERD (gastroesophageal reflux disease)   ? hiatal hernia  ? H/O: gout   ?  Hyperlipidemia   ? Hypertension   ? Pancreatitis   ? PONV (postoperative nausea and vomiting)   ? ? ?Past Surgical History:  ?Procedure Laterality Date  ? ABDOMINAL HYSTERECTOMY    ? APPENDECTOMY    ? Arthroscopic Left Knee Left 03/22/2003  ? CHOLECYSTECTOMY    ? in 1990s  ? COLONOSCOPY    ? [place], 2010, without polyps  ? ESOPHAGOGASTRODUODENOSCOPY (EGD) WITH PROPOFOL N/A 11/08/2018  ? Procedure: ESOPHAGOGASTRODUODENOSCOPY (EGD) WITH PROPOFOL;  Surgeon: [name], MD;  Location: [facility] ENDOSCOPY;  Service: Endoscopy;  Laterality: N/A;  ? EUS N/A 11/08/2018  ? Procedure: UPPER ENDOSCOPIC ULTRASOUND (EUS) RADIAL;  Surgeon: [name], MD;  Location: [facility] ENDOSCOPY;  Service: Endoscopy;  Laterality: N/A;  ? ? ?Current Outpatient Medications  ?Medication Sig Dispense Refill  ? aspirin EC 81 MG tablet Take 81 mg by mouth daily.    ? BD PEN NEEDLE NANO U/F 32G X 4 MM MISC USE UP TO TWICE DAILY 100 each 1  ? Cholecalciferol (VITAMIN D3) 125 MCG (5000 UT) TABS Take 5,000 Units by mouth daily.    ? Choline Fenofibrate (FENOFIBRIC ACID) 135 MG CPDR Take 1 capsule by mouth at bedtime. 90 capsule 2  ? CREON 36000-114000 units CPEP capsule TAKE TWO CAPSULES BY MOUTH WITH MEALS AND ONE WITH SNACKS (UP TO 8 A DAY) 240 capsule 11  ? Diclofenac Sodium CR 100 MG 24 hr tablet Take 1 tablet (  100 mg total) by mouth daily as needed for pain. 90 tablet 0  ? dicyclomine (BENTYL) 10 MG capsule Take one capsule up to twice daily for abdominal cramping or loose stools. 60 capsule 3  ? estradiol (ESTRACE) 0.5 MG tablet TAKE ONE TABLET BY MOUTH DAILY 90 tablet 3  ? fluticasone (FLONASE) 50 MCG/ACT nasal spray Place 2 sprays into both nostrils daily as needed. 16 g 5  ? gabapentin (NEURONTIN) 300 MG capsule Take 1 capsule (300 mg total) by mouth at bedtime. 90 capsule 1  ? LEVEMIR FLEXTOUCH 100 UNIT/ML FlexTouch Pen INJECT 5 TO 50 UNITS INTO THE SKIN AT BEDTIME NIGHTLY 15 mL 0  ? lisinopril (ZESTRIL) 20 MG tablet TAKE ONE (1) TABLET  BY MOUTH EVERY DAY 90 tablet 3  ? Magnesium Oxide, Elemental, 400 MG TABS Take 1 tablet by mouth in the morning and at bedtime. (Patient taking differently: Take 1 tablet by mouth in the morning and at bedtime. Takes BID per pt) 180 tablet 3  ? metFORMIN (GLUCOPHAGE) 500 MG tablet Take 2 tablets (1,000 mg total) by mouth 2 (two) times daily with a meal. TAKE TWO (2) TABLETS BY MOUTH 2 TIMES DAILY WITH A MEAL 360 tablet 3  ? pravastatin (PRAVACHOL) 10 MG tablet TAKE ONE TABLET ('10MG'$  TOTAL) BY MOUTH ATBEDTIME 90 tablet 0  ? Probiotic Product (PROBIOTIC DAILY PO) Take by mouth daily as needed.     ? sodium chloride (OCEAN) 0.65 % SOLN nasal spray Place 1 spray into both nostrils as needed for congestion. 88 mL 0  ? pantoprazole (PROTONIX) 40 MG tablet Take one tablet once or twice daily, 30 minutes before a meal. 180 tablet 3  ? ?No current facility-administered medications for this visit.  ? ? ?Allergies as of 07/29/2021 - Review Complete 07/29/2021  ?Allergen Reaction Noted  ? Codeine Nausea And Vomiting 11/28/2012  ? Jardiance [empagliflozin]  02/08/2019  ? Macrobid [nitrofurantoin] Hives 11/28/2012  ? Simvastatin  10/26/2017  ? ? ?Family History  ?Problem Relation Age of Onset  ? Stroke Mother   ? Diabetes Mother   ? Vision loss Mother   ?     eye removed  ? Cancer Mother   ?     cancer in eye - removed  ? Diabetes Brother   ? Heart disease Father   ? Colon cancer Sister   ? Uterine cancer Paternal Grandmother   ? Uterine cancer Maternal Aunt   ? Pancreatic cancer Maternal Aunt   ? ? ?Social History  ? ?Socioeconomic History  ? Marital status: Married  ?  Spouse name: Not on file  ? Number of children: 1  ? Years of education: Not on file  ? Highest education level: Not on file  ?Occupational History  ? Occupation: retired Pharmacist, hospital  ?Tobacco Use  ? Smoking status: Never  ? Smokeless tobacco: Never  ?Vaping Use  ? Vaping Use: Never used  ?Substance and Sexual Activity  ? Alcohol use: No  ? Drug use: No  ? Sexual  activity: Not on file  ?Other Topics Concern  ? Not on file  ?Social History Narrative  ? Retired March 2014.  ? Did teach Kindergarten at [employer]  ? ?Social Determinants of Health  ? ?Financial Resource Strain: Not on file  ?Food Insecurity: Not on file  ?Transportation Needs: No Transportation Needs  ? Lack of Transportation (Medical): No  ? Lack of Transportation (Non-Medical): No  ?Physical Activity: Not on file  ?Stress: Not on file  ?  Social Connections: Not on file  ? ? ?Review of Systems: ?Gen: Denies fever, chills, cold or flu like symptoms, pre-syncope, or syncope.  ?CV: Denies chest pain, palpitations. ?Resp: Denies dyspnea or cough.  ?GI: See HPI ?Heme: See HPI ? ?Physical Exam: ?BP 125/64   Pulse 82   Temp (!) 97.5 ?F (36.4 ?C) (Temporal)   Ht '5\' 4"'$  (1.626 m)   Wt 123 lb 12.8 oz (56.2 kg)   BMI 21.25 kg/m?  ?General:   Alert and oriented. No distress noted. Pleasant and cooperative.  ?Head:  Normocephalic and atraumatic. ?Eyes:  Conjuctiva clear without scleral icterus. ?Heart:  S1, S2 present without murmurs appreciated. ?Lungs:  Clear to auscultation bilaterally. No wheezes, rales, or rhonchi. No distress.  ?Abdomen:  +BS, soft, non-tender and non-distended. No rebound or guarding. No HSM or masses noted. ?Msk:  Symmetrical without gross deformities. Normal posture. ?Extremities:  Without edema. ?Neurologic:  Alert and  oriented x4 ?Psych:  Normal mood and affect. ? ? ? ?Assessment:  ?76 y.o. female with history of GERD and chronic pancreatitis based on EUS/MRCP findings in 2020 with evidence of exocrine/endocrine deficiency on pancreatic enzymes. Etiology of chronic pancreatitis unknown. No FH of pancreatitis. No etoh history. EUS without evidence of biliary etiology, gallbladder has been removed. No pancreatic duct stone on MRCP.  Her symptoms are fairly well controlled with Creon 2 capsules with meals and 1 capsule with snacks.  Occasional breakthrough symptoms of diarrhea and  abdominal cramping that responds well to dicyclomine as needed.  No alarm symptoms.  Weight is stable.  GERD remains well controlled on Protonix 1-2 times daily. ? ?We did discuss colon cancer screening, and I offered t

## 2021-07-29 ENCOUNTER — Encounter: Payer: Self-pay | Admitting: Gastroenterology

## 2021-07-29 ENCOUNTER — Ambulatory Visit (INDEPENDENT_AMBULATORY_CARE_PROVIDER_SITE_OTHER): Payer: Medicare Other | Admitting: Gastroenterology

## 2021-07-29 VITALS — BP 125/64 | HR 82 | Temp 97.5°F | Ht 64.0 in | Wt 123.8 lb

## 2021-07-29 DIAGNOSIS — K861 Other chronic pancreatitis: Secondary | ICD-10-CM

## 2021-07-29 DIAGNOSIS — Z1211 Encounter for screening for malignant neoplasm of colon: Secondary | ICD-10-CM | POA: Diagnosis not present

## 2021-07-29 DIAGNOSIS — K219 Gastro-esophageal reflux disease without esophagitis: Secondary | ICD-10-CM

## 2021-07-29 MED ORDER — PANTOPRAZOLE SODIUM 40 MG PO TBEC: DELAYED_RELEASE_TABLET | ORAL | 3 refills | Status: DC

## 2021-07-29 NOTE — Patient Instructions (Signed)
Continue Protonix 40 mg 1-2 times daily before a meal.  ? ?Follow a GERD diet:  ?Avoid fried, fatty, greasy, spicy, citrus foods. ?Avoid caffeine and carbonated beverages. ?Avoid chocolate. ?Try eating 4-6 small meals a day rather than 3 large meals. ?Do not eat within 3 hours of laying down. ?Prop head of bed up on wood or bricks to create a 6 inch incline. ? ?Continue Creon 2 capsules with meals and 1 capsule with snacks.  ? ?Continue to use dicyclomine as needed for flares of abdominal cramping or diarrhea.  ? ?We will see you back in about 1 year. Do not hesitate to call sooner if needed.  ? ?Aliene Altes, PA-C ?[facility] ? ?

## 2021-08-25 DIAGNOSIS — Z743 Need for continuous supervision: Secondary | ICD-10-CM | POA: Diagnosis not present

## 2021-08-25 DIAGNOSIS — R52 Pain, unspecified: Secondary | ICD-10-CM | POA: Diagnosis not present

## 2021-08-25 DIAGNOSIS — R0689 Other abnormalities of breathing: Secondary | ICD-10-CM | POA: Diagnosis not present

## 2021-08-25 DIAGNOSIS — S0990XA Unspecified injury of head, initial encounter: Secondary | ICD-10-CM | POA: Diagnosis not present

## 2021-08-25 DIAGNOSIS — R58 Hemorrhage, not elsewhere classified: Secondary | ICD-10-CM | POA: Diagnosis not present

## 2021-08-26 ENCOUNTER — Emergency Department (HOSPITAL_COMMUNITY): Payer: Medicare Other

## 2021-08-26 ENCOUNTER — Encounter (HOSPITAL_COMMUNITY): Payer: Self-pay | Admitting: Emergency Medicine

## 2021-08-26 ENCOUNTER — Other Ambulatory Visit: Payer: Self-pay

## 2021-08-26 ENCOUNTER — Inpatient Hospital Stay (HOSPITAL_COMMUNITY)
Admission: EM | Admit: 2021-08-26 | Discharge: 2021-09-01 | DRG: 480 | Disposition: A | Discharge status: Rehabilitation Facility | Payer: Medicare Other | Attending: Surgery | Admitting: Surgery

## 2021-08-26 DIAGNOSIS — M25532 Pain in left wrist: Secondary | ICD-10-CM | POA: Diagnosis not present

## 2021-08-26 DIAGNOSIS — Z794 Long term (current) use of insulin: Secondary | ICD-10-CM

## 2021-08-26 DIAGNOSIS — K219 Gastro-esophageal reflux disease without esophagitis: Secondary | ICD-10-CM | POA: Diagnosis present

## 2021-08-26 DIAGNOSIS — M25552 Pain in left hip: Secondary | ICD-10-CM | POA: Diagnosis not present

## 2021-08-26 DIAGNOSIS — Z833 Family history of diabetes mellitus: Secondary | ICD-10-CM

## 2021-08-26 DIAGNOSIS — M25559 Pain in unspecified hip: Secondary | ICD-10-CM | POA: Diagnosis not present

## 2021-08-26 DIAGNOSIS — S72142A Displaced intertrochanteric fracture of left femur, initial encounter for closed fracture: Principal | ICD-10-CM | POA: Diagnosis present

## 2021-08-26 DIAGNOSIS — D649 Anemia, unspecified: Secondary | ICD-10-CM | POA: Diagnosis not present

## 2021-08-26 DIAGNOSIS — Z888 Allergy status to other drugs, medicaments and biological substances status: Secondary | ICD-10-CM

## 2021-08-26 DIAGNOSIS — S62102A Fracture of unspecified carpal bone, left wrist, initial encounter for closed fracture: Secondary | ICD-10-CM | POA: Diagnosis not present

## 2021-08-26 DIAGNOSIS — Z7982 Long term (current) use of aspirin: Secondary | ICD-10-CM

## 2021-08-26 DIAGNOSIS — Z808 Family history of malignant neoplasm of other organs or systems: Secondary | ICD-10-CM | POA: Diagnosis not present

## 2021-08-26 DIAGNOSIS — I609 Nontraumatic subarachnoid hemorrhage, unspecified: Secondary | ICD-10-CM | POA: Diagnosis not present

## 2021-08-26 DIAGNOSIS — Z79899 Other long term (current) drug therapy: Secondary | ICD-10-CM

## 2021-08-26 DIAGNOSIS — K861 Other chronic pancreatitis: Secondary | ICD-10-CM | POA: Diagnosis present

## 2021-08-26 DIAGNOSIS — Z821 Family history of blindness and visual loss: Secondary | ICD-10-CM | POA: Diagnosis not present

## 2021-08-26 DIAGNOSIS — M79605 Pain in left leg: Secondary | ICD-10-CM | POA: Diagnosis not present

## 2021-08-26 DIAGNOSIS — Z8249 Family history of ischemic heart disease and other diseases of the circulatory system: Secondary | ICD-10-CM | POA: Diagnosis not present

## 2021-08-26 DIAGNOSIS — E785 Hyperlipidemia, unspecified: Secondary | ICD-10-CM | POA: Diagnosis present

## 2021-08-26 DIAGNOSIS — I6200 Nontraumatic subdural hemorrhage, unspecified: Secondary | ICD-10-CM | POA: Diagnosis not present

## 2021-08-26 DIAGNOSIS — S6292XA Unspecified fracture of left wrist and hand, initial encounter for closed fracture: Secondary | ICD-10-CM | POA: Diagnosis not present

## 2021-08-26 DIAGNOSIS — Z8049 Family history of malignant neoplasm of other genital organs: Secondary | ICD-10-CM | POA: Diagnosis not present

## 2021-08-26 DIAGNOSIS — J45909 Unspecified asthma, uncomplicated: Secondary | ICD-10-CM | POA: Diagnosis not present

## 2021-08-26 DIAGNOSIS — S066X0A Traumatic subarachnoid hemorrhage without loss of consciousness, initial encounter: Secondary | ICD-10-CM | POA: Diagnosis not present

## 2021-08-26 DIAGNOSIS — Z79818 Long term (current) use of other agents affecting estrogen receptors and estrogen levels: Secondary | ICD-10-CM | POA: Diagnosis not present

## 2021-08-26 DIAGNOSIS — E559 Vitamin D deficiency, unspecified: Secondary | ICD-10-CM | POA: Diagnosis not present

## 2021-08-26 DIAGNOSIS — S065XAA Traumatic subdural hemorrhage with loss of consciousness status unknown, initial encounter: Secondary | ICD-10-CM

## 2021-08-26 DIAGNOSIS — S52502A Unspecified fracture of the lower end of left radius, initial encounter for closed fracture: Secondary | ICD-10-CM | POA: Diagnosis not present

## 2021-08-26 DIAGNOSIS — W010XXA Fall on same level from slipping, tripping and stumbling without subsequent striking against object, initial encounter: Secondary | ICD-10-CM | POA: Diagnosis present

## 2021-08-26 DIAGNOSIS — S72002D Fracture of unspecified part of neck of left femur, subsequent encounter for closed fracture with routine healing: Secondary | ICD-10-CM | POA: Diagnosis not present

## 2021-08-26 DIAGNOSIS — E119 Type 2 diabetes mellitus without complications: Secondary | ICD-10-CM | POA: Diagnosis present

## 2021-08-26 DIAGNOSIS — Z881 Allergy status to other antibiotic agents status: Secondary | ICD-10-CM | POA: Diagnosis not present

## 2021-08-26 DIAGNOSIS — S066XAD Traumatic subarachnoid hemorrhage with loss of consciousness status unknown, subsequent encounter: Secondary | ICD-10-CM | POA: Diagnosis present

## 2021-08-26 DIAGNOSIS — T07XXXA Unspecified multiple injuries, initial encounter: Secondary | ICD-10-CM | POA: Diagnosis present

## 2021-08-26 DIAGNOSIS — S0101XA Laceration without foreign body of scalp, initial encounter: Secondary | ICD-10-CM | POA: Diagnosis present

## 2021-08-26 DIAGNOSIS — L89151 Pressure ulcer of sacral region, stage 1: Secondary | ICD-10-CM | POA: Diagnosis not present

## 2021-08-26 DIAGNOSIS — S065XAD Traumatic subdural hemorrhage with loss of consciousness status unknown, subsequent encounter: Secondary | ICD-10-CM | POA: Diagnosis not present

## 2021-08-26 DIAGNOSIS — Z9049 Acquired absence of other specified parts of digestive tract: Secondary | ICD-10-CM | POA: Diagnosis not present

## 2021-08-26 DIAGNOSIS — M109 Gout, unspecified: Secondary | ICD-10-CM | POA: Diagnosis present

## 2021-08-26 DIAGNOSIS — S52502D Unspecified fracture of the lower end of left radius, subsequent encounter for closed fracture with routine healing: Secondary | ICD-10-CM | POA: Diagnosis not present

## 2021-08-26 DIAGNOSIS — Z823 Family history of stroke: Secondary | ICD-10-CM

## 2021-08-26 DIAGNOSIS — Z23 Encounter for immunization: Secondary | ICD-10-CM

## 2021-08-26 DIAGNOSIS — R7989 Other specified abnormal findings of blood chemistry: Secondary | ICD-10-CM | POA: Diagnosis not present

## 2021-08-26 DIAGNOSIS — S52552A Other extraarticular fracture of lower end of left radius, initial encounter for closed fracture: Secondary | ICD-10-CM | POA: Diagnosis present

## 2021-08-26 DIAGNOSIS — D62 Acute posthemorrhagic anemia: Secondary | ICD-10-CM | POA: Diagnosis present

## 2021-08-26 DIAGNOSIS — T1490XA Injury, unspecified, initial encounter: Secondary | ICD-10-CM | POA: Diagnosis not present

## 2021-08-26 DIAGNOSIS — Z885 Allergy status to narcotic agent status: Secondary | ICD-10-CM

## 2021-08-26 DIAGNOSIS — Z7984 Long term (current) use of oral hypoglycemic drugs: Secondary | ICD-10-CM | POA: Diagnosis not present

## 2021-08-26 DIAGNOSIS — S728X2A Other fracture of left femur, initial encounter for closed fracture: Secondary | ICD-10-CM | POA: Diagnosis not present

## 2021-08-26 DIAGNOSIS — I10 Essential (primary) hypertension: Secondary | ICD-10-CM | POA: Diagnosis present

## 2021-08-26 DIAGNOSIS — W1789XD Other fall from one level to another, subsequent encounter: Secondary | ICD-10-CM | POA: Diagnosis not present

## 2021-08-26 DIAGNOSIS — S52612A Displaced fracture of left ulna styloid process, initial encounter for closed fracture: Secondary | ICD-10-CM | POA: Diagnosis not present

## 2021-08-26 DIAGNOSIS — R079 Chest pain, unspecified: Secondary | ICD-10-CM | POA: Diagnosis not present

## 2021-08-26 DIAGNOSIS — Z800 Family history of malignant neoplasm of digestive organs: Secondary | ICD-10-CM | POA: Diagnosis not present

## 2021-08-26 DIAGNOSIS — Z90710 Acquired absence of both cervix and uterus: Secondary | ICD-10-CM | POA: Diagnosis not present

## 2021-08-26 DIAGNOSIS — S72142S Displaced intertrochanteric fracture of left femur, sequela: Secondary | ICD-10-CM | POA: Diagnosis not present

## 2021-08-26 DIAGNOSIS — S065X0A Traumatic subdural hemorrhage without loss of consciousness, initial encounter: Secondary | ICD-10-CM | POA: Diagnosis not present

## 2021-08-26 DIAGNOSIS — S0003XA Contusion of scalp, initial encounter: Secondary | ICD-10-CM | POA: Diagnosis not present

## 2021-08-26 DIAGNOSIS — S52592A Other fractures of lower end of left radius, initial encounter for closed fracture: Secondary | ICD-10-CM | POA: Diagnosis not present

## 2021-08-26 DIAGNOSIS — S199XXA Unspecified injury of neck, initial encounter: Secondary | ICD-10-CM | POA: Diagnosis not present

## 2021-08-26 DIAGNOSIS — S7292XA Unspecified fracture of left femur, initial encounter for closed fracture: Secondary | ICD-10-CM | POA: Diagnosis not present

## 2021-08-26 DIAGNOSIS — E1169 Type 2 diabetes mellitus with other specified complication: Secondary | ICD-10-CM | POA: Diagnosis present

## 2021-08-26 LAB — GLUCOSE, CAPILLARY
Glucose-Capillary: 127 mg/dL — ABNORMAL HIGH (ref 70–99)
Glucose-Capillary: 114 mg/dL — ABNORMAL HIGH (ref 70–99)
Glucose-Capillary: 127 mg/dL — ABNORMAL HIGH (ref 70–99)

## 2021-08-26 LAB — BASIC METABOLIC PANEL
Anion gap: 2 — ABNORMAL LOW (ref 5–15)
BUN: 24 mg/dL — ABNORMAL HIGH (ref 8–23)
CO2: 25 mmol/L (ref 22–32)
Calcium: 8.6 mg/dL — ABNORMAL LOW (ref 8.9–10.3)
Chloride: 110 mmol/L (ref 98–111)
Creatinine, Ser: 0.66 mg/dL (ref 0.44–1.00)
GFR, Estimated: >60 mL/min (ref >60)
Glucose, Bld: 178 mg/dL — ABNORMAL HIGH (ref 70–99)
Potassium: 4.1 mmol/L (ref 3.5–5.1)
Sodium: 137 mmol/L (ref 135–145)

## 2021-08-26 LAB — CBC WITH DIFFERENTIAL/PLATELET
Abs Immature Granulocytes: 0.07 10*3/uL (ref 0.00–0.07)
Basophils Absolute: 0.1 10*3/uL (ref 0.0–0.1)
Basophils Relative: 1 %
Eosinophils Absolute: 0 10*3/uL (ref 0.0–0.5)
Eosinophils Relative: 0 %
HCT: 33.3 % — ABNORMAL LOW (ref 36.0–46.0)
Hemoglobin: 10.9 g/dL — ABNORMAL LOW (ref 12.0–15.0)
Immature Granulocytes: 1 %
Lymphocytes Relative: 8 %
Lymphs Abs: 0.9 10*3/uL (ref 0.7–4.0)
MCH: 30.7 pg (ref 26.0–34.0)
MCHC: 32.7 g/dL (ref 30.0–36.0)
MCV: 93.8 fL (ref 80.0–100.0)
Monocytes Absolute: 0.7 10*3/uL (ref 0.1–1.0)
Monocytes Relative: 6 %
Neutro Abs: 9.9 10*3/uL — ABNORMAL HIGH (ref 1.7–7.7)
Neutrophils Relative %: 84 %
Platelets: 221 10*3/uL (ref 150–400)
RBC: 3.55 MIL/uL — ABNORMAL LOW (ref 3.87–5.11)
RDW: 13 % (ref 11.5–15.5)
WBC: 11.6 10*3/uL — ABNORMAL HIGH (ref 4.0–10.5)
nRBC: 0 % (ref 0.0–0.2)

## 2021-08-26 LAB — PROTIME-INR
INR: 1 (ref 0.8–1.2)
Prothrombin Time: 13.5 seconds (ref 11.4–15.2)

## 2021-08-26 LAB — CBG MONITORING, ED: Glucose-Capillary: 146 mg/dL — ABNORMAL HIGH (ref 70–99)

## 2021-08-26 MED ORDER — ESTRADIOL 1 MG PO TABS
0.5000 mg | ORAL_TABLET | Freq: Every day | ORAL | Status: DC
  Administered 2021-08-28 – 2021-09-01 (×5): 0.5 mg via ORAL
  Filled 2021-08-26 (×7): qty 0.5

## 2021-08-26 MED ORDER — POLYETHYLENE GLYCOL 3350 17 G PO PACK
17.0000 g | PACK | Freq: Every day | ORAL | Status: DC
  Administered 2021-08-28 – 2021-08-30 (×3): 17 g via ORAL
  Filled 2021-08-26 (×5): qty 1

## 2021-08-26 MED ORDER — PANTOPRAZOLE SODIUM 40 MG IV SOLR
40.0000 mg | Freq: Every day | INTRAVENOUS | Status: DC
Start: 2021-08-26 — End: 2021-09-01
  Filled 2021-08-26: qty 10

## 2021-08-26 MED ORDER — BISACODYL 10 MG RE SUPP: 10.0000 mg | Freq: Every day | RECTAL | Status: DC | PRN

## 2021-08-26 MED ORDER — INSULIN ASPART 100 UNIT/ML IJ SOLN
0.0000 [IU] | INTRAMUSCULAR | Status: DC
  Administered 2021-08-26 (×2): 2 [IU] via SUBCUTANEOUS

## 2021-08-26 MED ORDER — MORPHINE SULFATE (PF) 2 MG/ML IV SOLN
2.0000 mg | INTRAVENOUS | Status: DC | PRN
  Administered 2021-08-26 – 2021-08-27 (×2): 2 mg via INTRAVENOUS
  Filled 2021-08-26 (×2): qty 1

## 2021-08-26 MED ORDER — FLUTICASONE PROPIONATE 50 MCG/ACT NA SUSP: 2.0000 | Freq: Every day | NASAL | Status: DC | PRN

## 2021-08-26 MED ORDER — LEVETIRACETAM IN NACL 500 MG/100ML IV SOLN
500.0000 mg | Freq: Two times a day (BID) | INTRAVENOUS | Status: DC
  Administered 2021-08-26 – 2021-08-30 (×10): 500 mg via INTRAVENOUS
  Filled 2021-08-26 (×11): qty 100

## 2021-08-26 MED ORDER — SODIUM CHLORIDE 0.9 % IV SOLN: INTRAVENOUS | Status: DC

## 2021-08-26 MED ORDER — METHOCARBAMOL 500 MG PO TABS
500.0000 mg | ORAL_TABLET | Freq: Four times a day (QID) | ORAL | Status: DC
  Administered 2021-08-26 – 2021-09-01 (×24): 500 mg via ORAL
  Filled 2021-08-26 (×24): qty 1

## 2021-08-26 MED ORDER — CHLORHEXIDINE GLUCONATE 4 % EX LIQD
60.0000 mL | Freq: Once | CUTANEOUS | Status: DC
  Administered 2021-08-27: 4 via TOPICAL

## 2021-08-26 MED ORDER — PANCRELIPASE (LIP-PROT-AMYL) 36000-114000 UNITS PO CPEP
36000.0000 [IU] | ORAL_CAPSULE | Freq: Three times a day (TID) | ORAL | Status: DC
  Administered 2021-08-27: 36000 [IU] via ORAL
  Filled 2021-08-26: qty 1

## 2021-08-26 MED ORDER — OXYCODONE HCL 5 MG PO TABS
5.0000 mg | ORAL_TABLET | ORAL | Status: DC | PRN
  Administered 2021-08-30 (×2): 5 mg via ORAL
  Filled 2021-08-26 (×2): qty 1

## 2021-08-26 MED ORDER — MORPHINE SULFATE (PF) 4 MG/ML IV SOLN
INTRAVENOUS | Status: AC
  Administered 2021-08-26: 4 mg via INTRAVENOUS
  Filled 2021-08-26: qty 1

## 2021-08-26 MED ORDER — GABAPENTIN 300 MG PO CAPS
300.0000 mg | ORAL_CAPSULE | Freq: Every day | ORAL | Status: DC
  Administered 2021-08-26 – 2021-08-31 (×6): 300 mg via ORAL
  Filled 2021-08-26 (×6): qty 1

## 2021-08-26 MED ORDER — MUPIROCIN 2 % EX OINT
1.0000 "application " | TOPICAL_OINTMENT | Freq: Two times a day (BID) | CUTANEOUS | Status: AC
  Administered 2021-08-26 – 2021-08-31 (×8): 1 via NASAL
  Filled 2021-08-26 (×3): qty 22

## 2021-08-26 MED ORDER — TETANUS-DIPHTH-ACELL PERTUSSIS 5-2.5-18.5 LF-MCG/0.5 IM SUSY
0.5000 mL | PREFILLED_SYRINGE | Freq: Once | INTRAMUSCULAR | Status: AC
  Administered 2021-08-26: 0.5 mL via INTRAMUSCULAR
  Filled 2021-08-26: qty 0.5

## 2021-08-26 MED ORDER — PROCHLORPERAZINE EDISYLATE 10 MG/2ML IJ SOLN: 5.0000 mg | Freq: Four times a day (QID) | INTRAMUSCULAR | Status: DC | PRN

## 2021-08-26 MED ORDER — DICYCLOMINE HCL 10 MG PO CAPS: 10.0000 mg | ORAL_CAPSULE | Freq: Two times a day (BID) | ORAL | Status: DC | PRN

## 2021-08-26 MED ORDER — PROCHLORPERAZINE MALEATE 10 MG PO TABS: 10.0000 mg | ORAL_TABLET | Freq: Four times a day (QID) | ORAL | Status: DC | PRN

## 2021-08-26 MED ORDER — DOCUSATE SODIUM 100 MG PO CAPS
100.0000 mg | ORAL_CAPSULE | Freq: Two times a day (BID) | ORAL | Status: DC
Start: 2021-08-26 — End: 2021-09-01
  Administered 2021-08-26 – 2021-08-31 (×9): 100 mg via ORAL
  Filled 2021-08-26 (×12): qty 1

## 2021-08-26 MED ORDER — ONDANSETRON HCL 4 MG/2ML IJ SOLN
INTRAMUSCULAR | Status: AC
  Administered 2021-08-26: 4 mg via INTRAVENOUS
  Filled 2021-08-26: qty 2

## 2021-08-26 MED ORDER — ONDANSETRON 4 MG PO TBDP: 4.0000 mg | ORAL_TABLET | Freq: Four times a day (QID) | ORAL | Status: DC | PRN

## 2021-08-26 MED ORDER — SALINE SPRAY 0.65 % NA SOLN
1.0000 | NASAL | Status: DC | PRN
Start: 2021-08-26 — End: 2021-09-01

## 2021-08-26 MED ORDER — ACETAMINOPHEN 500 MG PO TABS
1000.0000 mg | ORAL_TABLET | Freq: Three times a day (TID) | ORAL | Status: DC
  Administered 2021-08-26 – 2021-09-01 (×18): 1000 mg via ORAL
  Filled 2021-08-26 (×19): qty 2

## 2021-08-26 MED ORDER — ONDANSETRON HCL 4 MG/2ML IJ SOLN: 4.0000 mg | Freq: Four times a day (QID) | INTRAMUSCULAR | Status: DC | PRN

## 2021-08-26 MED ORDER — MORPHINE SULFATE (PF) 4 MG/ML IV SOLN
4.0000 mg | Freq: Once | INTRAVENOUS | Status: AC
  Administered 2021-08-26: 4 mg via INTRAVENOUS
  Filled 2021-08-26: qty 1

## 2021-08-26 MED ORDER — PANTOPRAZOLE SODIUM 40 MG PO TBEC
40.0000 mg | DELAYED_RELEASE_TABLET | Freq: Every day | ORAL | Status: DC
  Administered 2021-08-26 – 2021-09-01 (×6): 40 mg via ORAL
  Filled 2021-08-26 (×6): qty 1

## 2021-08-26 MED ORDER — METHOCARBAMOL 1000 MG/10ML IJ SOLN
500.0000 mg | Freq: Four times a day (QID) | INTRAVENOUS | Status: DC
  Filled 2021-08-26: qty 5

## 2021-08-26 MED ORDER — LISINOPRIL 20 MG PO TABS
20.0000 mg | ORAL_TABLET | Freq: Every day | ORAL | Status: DC
  Administered 2021-08-26 – 2021-09-01 (×6): 20 mg via ORAL
  Filled 2021-08-26 (×6): qty 1

## 2021-08-26 MED ORDER — HYDRALAZINE HCL 20 MG/ML IJ SOLN: 10.0000 mg | INTRAMUSCULAR | Status: DC | PRN

## 2021-08-26 NOTE — ED Provider Notes (Addendum)
[facility] EMERGENCY DEPARTMENT Provider Note   CSN: [id number] Arrival date & time: 08/26/21  0004     History  Chief Complaint  Patient presents with   Lytle Michaels    Jennifer Porter is a 76 y.o. female.  Patient presents to the emergency department for evaluation after a fall.  Patient reports that she lost her balance and fell on her porch tonight.  Patient did hit her head but no loss of consciousness.  Complaining of left wrist pain, left femur pain.       Home Medications Prior to Admission medications   Medication Sig Start Date End Date Taking? Authorizing Provider  aspirin EC 81 MG tablet Take 81 mg by mouth daily.    [provider]  BD PEN NEEDLE NANO U/F 32G X 4 MM MISC USE UP TO TWICE DAILY 02/10/21   Eulogio Bear, NP  Cholecalciferol (VITAMIN D3) 125 MCG (5000 UT) TABS Take 5,000 Units by mouth daily.    [provider]  Choline Fenofibrate (FENOFIBRIC ACID) 135 MG CPDR Take 1 capsule by mouth at bedtime. 03/29/21   Eulogio Bear, NP  CREON [phone] units CPEP capsule TAKE TWO CAPSULES BY MOUTH WITH MEALS AND ONE WITH SNACKS (UP TO 8 A DAY) 12/28/20   Erenest Rasher, PA-C  Diclofenac Sodium CR 100 MG 24 hr tablet Take 1 tablet (100 mg total) by mouth daily as needed for pain. 07/20/20   Eulogio Bear, NP  dicyclomine (BENTYL) 10 MG capsule Take one capsule up to twice daily for abdominal cramping or loose stools. 08/21/19   Mahala Menghini, PA-C  estradiol (ESTRACE) 0.5 MG tablet TAKE ONE TABLET BY MOUTH DAILY 09/08/20   Eulogio Bear, NP  fluticasone (FLONASE) 50 MCG/ACT nasal spray Place 2 sprays into both nostrils daily as needed. 01/28/20   [place], Modena Nunnery, MD  gabapentin (NEURONTIN) 300 MG capsule Take 1 capsule (300 mg total) by mouth at bedtime. 03/29/21   Eulogio Bear, NP  LEVEMIR FLEXTOUCH 100 UNIT/ML FlexTouch Pen INJECT 5 TO 50 UNITS INTO THE SKIN AT BEDTIME NIGHTLY 01/26/21   Noemi Chapel A, NP  lisinopril  (ZESTRIL) 20 MG tablet TAKE ONE (1) TABLET BY MOUTH EVERY DAY 01/28/21   Susy Frizzle, MD  Magnesium Oxide, Elemental, 400 MG TABS Take 1 tablet by mouth in the morning and at bedtime. Patient taking differently: Take 1 tablet by mouth in the morning and at bedtime. Takes BID per pt 02/25/20   Alycia Rossetti, MD  metFORMIN (GLUCOPHAGE) 500 MG tablet Take 2 tablets (1,000 mg total) by mouth 2 (two) times daily with a meal. TAKE TWO (2) TABLETS BY MOUTH 2 TIMES DAILY WITH A MEAL 09/08/20   Eulogio Bear, NP  pantoprazole (PROTONIX) 40 MG tablet Take one tablet once or twice daily, 30 minutes before a meal. 07/29/21   Erenest Rasher, PA-C  pravastatin (PRAVACHOL) 10 MG tablet TAKE ONE TABLET ('10MG'$  TOTAL) BY MOUTH ATBEDTIME 01/11/21   Eulogio Bear, NP  Probiotic Product (PROBIOTIC DAILY PO) Take by mouth daily as needed.     [provider]  sodium chloride (OCEAN) 0.65 % SOLN nasal spray Place 1 spray into both nostrils as needed for congestion. 07/20/20   Eulogio Bear, NP      Allergies    Codeine, Jardiance [empagliflozin], Macrobid [nitrofurantoin], and Simvastatin    Review of Systems   Review of Systems  Physical Exam Updated Vital Signs BP  138/76   Pulse 95   Temp 97.6 F (36.4 C) (Oral)   Resp 19   Ht '5\' 4"'$  (1.626 m)   Wt 55.8 kg   SpO2 99%   BMI 21.11 kg/m  Physical Exam Vitals and nursing note reviewed.  Constitutional:      General: She is not in acute distress.    Appearance: She is well-developed.  HENT:     Head: Normocephalic. Contusion and laceration present.      Mouth/Throat:     Mouth: Mucous membranes are moist.  Eyes:     General: Vision grossly intact. Gaze aligned appropriately.     Extraocular Movements: Extraocular movements intact.     Conjunctiva/sclera: Conjunctivae normal.  Cardiovascular:     Rate and Rhythm: Normal rate and regular rhythm.     Pulses: Normal pulses.     Heart sounds: Normal heart sounds, S1  normal and S2 normal. No murmur heard.    No friction rub. No gallop.  Pulmonary:     Effort: Pulmonary effort is normal. No respiratory distress.     Breath sounds: Normal breath sounds.  Abdominal:     General: Bowel sounds are normal.     Palpations: Abdomen is soft.     Tenderness: There is no abdominal tenderness. There is no guarding or rebound.     Hernia: No hernia is present.  Musculoskeletal:        General: No swelling.     Left wrist: Swelling, deformity and tenderness present. Decreased range of motion.     Cervical back: Full passive range of motion without pain, normal range of motion and neck supple. No spinous process tenderness or muscular tenderness. Normal range of motion.     Left hip: Deformity (shortened, external rotation) present. Decreased range of motion.     Left upper leg: Tenderness present.     Right lower leg: No edema.     Left lower leg: No edema.  Skin:    General: Skin is warm and dry.     Capillary Refill: Capillary refill takes less than 2 seconds.     Findings: Laceration (scalp) present. No ecchymosis, erythema, rash or wound.  Neurological:     General: No focal deficit present.     Mental Status: She is alert and oriented to person, place, and time.     GCS: GCS eye subscore is 4. GCS verbal subscore is 5. GCS motor subscore is 6.     Cranial Nerves: Cranial nerves 2-12 are intact.     Sensory: Sensation is intact.     Motor: Motor function is intact.     Coordination: Coordination is intact.  Psychiatric:        Attention and Perception: Attention normal.        Mood and Affect: Mood normal.        Speech: Speech normal.        Behavior: Behavior normal.     ED Results / Procedures / Treatments   Labs (all labs ordered are listed, but only abnormal results are displayed) Labs Reviewed  CBC WITH DIFFERENTIAL/PLATELET - Abnormal; Notable for the following components:      Result Value   WBC 11.6 (*)    RBC 3.55 (*)    Hemoglobin  10.9 (*)    HCT 33.3 (*)    Neutro Abs 9.9 (*)    All other components within normal limits  BASIC METABOLIC PANEL  PROTIME-INR    EKG EKG Interpretation  Date/Time:  Thursday August 26 2021 00:10:46 EDT Ventricular Rate:  90 PR Interval:  185 QRS Duration: 100 QT Interval:  352 QTC Calculation: 431 R Axis:   19 Text Interpretation: Sinus rhythm Low voltage, precordial leads Abnormal R-wave progression, early transition Confirmed by Orpah Greek [phone]) on 08/26/2021 2:36:55 AM  Radiology DG Hip [employer] [place] or [place] Pelvis 1 View Left  Result Date: 08/26/2021 CLINICAL DATA:  Recent fall with left hip pain, initial encounter EXAM: DG HIP (WITH OR WITHOUT PELVIS) 3V PORT LEFT COMPARISON:  None Available. FINDINGS: Pelvic ring is intact. Comminuted left intratrochanteric fracture is noted. No dislocation is seen. No other fractures are noted. IMPRESSION: Left intratrochanteric fracture. Electronically Signed   By: Inez Catalina M.D.   On: 08/26/2021 04:04   DG Femur Min 2 Views Left  Result Date: 08/26/2021 CLINICAL DATA:  Recent fall with left leg pain, initial encounter EXAM: LEFT FEMUR 2 VIEWS COMPARISON:  None Available. FINDINGS: Degenerative changes are noted about the left knee joint. Intratrochanteric fracture is noted of the left hip without significant displacement. No dislocation is noted. No other focal abnormality is seen. IMPRESSION: Intratrochanteric fracture of the left femur. Electronically Signed   By: Inez Catalina M.D.   On: 08/26/2021 04:03   DG Wrist Complete Left  Result Date: 08/26/2021 CLINICAL DATA:  Recent fall with left wrist pain, initial encounter EXAM: LEFT WRIST - COMPLETE 3+ VIEW COMPARISON:  None Available. FINDINGS: Comminuted distal radial fracture is noted with intra-articular extension. Impaction is noted at the fracture site with associated soft tissue swelling. Comminuted ulnar styloid fracture is noted as well. Degenerative changes at the first [facility]  joint are noted. IMPRESSION: Distal left radial and ulnar fractures as described. Electronically Signed   By: Inez Catalina M.D.   On: 08/26/2021 04:02   DG Chest 1 View  Result Date: 08/26/2021 CLINICAL DATA:  Recent fall with chest pain, initial encounter EXAM: CHEST  1 VIEW COMPARISON:  None Available. FINDINGS: Cardiac shadow is within normal limits. Aortic calcifications are seen. Lungs are clear. No acute bony abnormality is noted. IMPRESSION: No active disease. Electronically Signed   By: Inez Catalina M.D.   On: 08/26/2021 04:01   CT Cervical Spine Wo Contrast  Result Date: 08/26/2021 CLINICAL DATA:  Fall EXAM: CT HEAD WITHOUT CONTRAST CT CERVICAL SPINE WITHOUT CONTRAST TECHNIQUE: Multidetector CT imaging of the head and cervical spine was performed following the standard protocol without intravenous contrast. Multiplanar CT image reconstructions of the cervical spine were also generated. RADIATION DOSE REDUCTION: This exam was performed according to the departmental dose-optimization program which includes automated exposure control, adjustment of the mA and/or kV according to patient size and/or use of iterative reconstruction technique. COMPARISON:  None Available. FINDINGS: CT HEAD FINDINGS Brain: There is a para falcine subdural hematoma that measures 4 mm in thickness. At the anterior left convexity there is a small focus of subarachnoid blood. No midline shift or other mass effect. The size and configuration of the ventricles and extra-axial CSF spaces are normal. There is hypoattenuation of the periventricular white matter, most commonly indicating chronic ischemic microangiopathy. Vascular: No abnormal hyperdensity of the major intracranial arteries or dural venous sinuses. No intracranial atherosclerosis. Skull: Left parietal scalp hematoma.  No skull fracture. Sinuses/Orbits: No fluid levels or advanced mucosal thickening of the visualized paranasal sinuses. No mastoid or middle ear effusion.  The orbits are normal. CT CERVICAL SPINE FINDINGS Alignment: Grade 1 anterolisthesis at C6-7. Skull base and  vertebrae: No acute fracture. Soft tissues and spinal canal: No prevertebral fluid or swelling. No visible canal hematoma. Disc levels: No advanced spinal canal or neural foraminal stenosis. Upper chest: No pneumothorax, pulmonary nodule or pleural effusion. Other: Normal visualized paraspinal cervical soft tissues. IMPRESSION: 1. Small parafalcine subdural hematoma without midline shift or other mass effect. 2. Small focus of subarachnoid blood at the anterior left convexity. 3. Left parietal scalp hematoma without skull fracture. 4. No acute fracture or static subluxation of the cervical spine. Critical Value/emergent results were called by telephone at the time of interpretation on 08/26/2021 at 3:12 am to provider [facility] , who verbally acknowledged these results. Electronically Signed   By: Ulyses Jarred M.D.   On: 08/26/2021 03:38   CT HEAD WO CONTRAST (5MM)  Result Date: 08/26/2021 CLINICAL DATA:  Fall EXAM: CT HEAD WITHOUT CONTRAST CT CERVICAL SPINE WITHOUT CONTRAST TECHNIQUE: Multidetector CT imaging of the head and cervical spine was performed following the standard protocol without intravenous contrast. Multiplanar CT image reconstructions of the cervical spine were also generated. RADIATION DOSE REDUCTION: This exam was performed according to the departmental dose-optimization program which includes automated exposure control, adjustment of the mA and/or kV according to patient size and/or use of iterative reconstruction technique. COMPARISON:  None Available. FINDINGS: CT HEAD FINDINGS Brain: There is a para falcine subdural hematoma that measures 4 mm in thickness. At the anterior left convexity there is a small focus of subarachnoid blood. No midline shift or other mass effect. The size and configuration of the ventricles and extra-axial CSF spaces are normal. There is  hypoattenuation of the periventricular white matter, most commonly indicating chronic ischemic microangiopathy. Vascular: No abnormal hyperdensity of the major intracranial arteries or dural venous sinuses. No intracranial atherosclerosis. Skull: Left parietal scalp hematoma.  No skull fracture. Sinuses/Orbits: No fluid levels or advanced mucosal thickening of the visualized paranasal sinuses. No mastoid or middle ear effusion. The orbits are normal. CT CERVICAL SPINE FINDINGS Alignment: Grade 1 anterolisthesis at C6-7. Skull base and vertebrae: No acute fracture. Soft tissues and spinal canal: No prevertebral fluid or swelling. No visible canal hematoma. Disc levels: No advanced spinal canal or neural foraminal stenosis. Upper chest: No pneumothorax, pulmonary nodule or pleural effusion. Other: Normal visualized paraspinal cervical soft tissues. IMPRESSION: 1. Small parafalcine subdural hematoma without midline shift or other mass effect. 2. Small focus of subarachnoid blood at the anterior left convexity. 3. Left parietal scalp hematoma without skull fracture. 4. No acute fracture or static subluxation of the cervical spine. Critical Value/emergent results were called by telephone at the time of interpretation on 08/26/2021 at 3:12 am to provider [facility] , who verbally acknowledged these results. Electronically Signed   By: Ulyses Jarred M.D.   On: 08/26/2021 03:38    Procedures Procedures    Medications Ordered in ED Medications  morphine (PF) 4 MG/ML injection (4 mg Intravenous Given 08/26/21 0138)  ondansetron (ZOFRAN) 4 MG/2ML injection (4 mg Intravenous Given 08/26/21 0232)    ED Course/ Medical Decision Making/ A&P                           Medical Decision Making Amount and/or Complexity of Data Reviewed Labs: ordered. Decision-making details documented in ED Course. Radiology: ordered and independent interpretation performed. Decision-making details documented in ED  Course. Discussion of management or test interpretation with external provider(s): Dr. Zenia Resides (trauma), Dr. Tempie Donning (Hand), Dr. Kathaleen Bury (Ortho), Margo Aye, FNP (Neurosurgery)  Risk Decision regarding hospitalization.   Patient presents to the emergency department after a ground-level fall.  Patient with contusion and abrasions to the left occipital parietal area of the scalp.  No repair necessary for scalp abrasions.  She is awake and alert, denies loss of consciousness.  She is not on blood thinners.  Patient's main complaint is left femur area pain but also experiencing left wrist pain.  Examination reveals deformity of the left hip and wrist.  Patient underwent CT head and cervical spine to further evaluate after the fall.  She does have small subarachnoid and subdural bleeds.  Discussed with neurosurgery, no intervention necessary.  Requires a repeat CT scan and if no change, no further evaluation or intervention necessary.  Left wrist x-ray does show impacted fracture of the distal radius and ulna.  It is closed, neurovascularly intact.  Discussed with Dr. Tempie Donning, on-call for hand surgery.  Agrees with splinting the wrist, will see patient when she arrives at [facility].  X-ray of left hip shows intertrochanteric hip fracture. Discussed with Dr. Kathaleen Bury, ortho.  He requests that the trauma service page on-call Ortho when the patient arrives at [facility].  Patient with multiple injuries after a ground-level fall.  Discussed with Dr. Zenia Resides, trauma service.  Accepts patient for transfer to trauma service at [facility].  CRITICAL CARE Performed by: Orpah Greek   Total critical care time: 40 minutes  Critical care time was exclusive of separately billable procedures and treating other patients.  Critical care was necessary to treat or prevent imminent or life-threatening deterioration.  Critical care was time spent personally by me on the following activities: development of  treatment plan with patient and/or surrogate as well as nursing, discussions with consultants, evaluation of patient's response to treatment, examination of patient, obtaining history from patient or surrogate, ordering and performing treatments and interventions, ordering and review of laboratory studies, ordering and review of radiographic studies, pulse oximetry and re-evaluation of patient's condition.         Final Clinical Impression(s) / ED Diagnoses Final diagnoses:  SAH (subarachnoid hemorrhage) (HCC)  SDH (subdural hematoma) (HCC)  Closed fracture of left wrist, initial encounter  Closed displaced intertrochanteric fracture of left femur, initial encounter [facility])    Rx / [place] Orders ED Discharge Orders     None         Jolly Carlini, Gwenyth Allegra, MD 08/26/21 2197    Orpah Greek, MD 08/26/21 [phone]

## 2021-08-26 NOTE — Progress Notes (Signed)
Orthopedic Tech Progress Note Patient Details:  Jennifer Porter Apr 16, 1945 [id number]   Ortho Devices Type of Ortho Device: Sling immobilizer, Sugartong splint Ortho Device/Splint Location: LUE Ortho Device/Splint Interventions: Ordered, Application, Adjustment   Post Interventions Patient Tolerated: Well Instructions Provided: Care of device, Adjustment of device  [name] Jeri Modena 08/26/2021, 12:02 PM

## 2021-08-26 NOTE — TOC Initial Note (Signed)
Transition of Care [facility]) - Initial/Assessment Note    Patient Details  Name: Jennifer Porter MRN: [id number] Date of Birth: [birth year]  [facility] CM/SW Contact:    Ella Bodo, RN Phone Number: 08/26/2021, 4:37 PM  Clinical Narrative:                 Patient admitted on 08/26/2021 after suffering a ground-level fall; she sustained SAH/SDH, left wrist fracture, left intra trochanteric femur fracture.  Prior to admission, patient independent and living at home with her husband, who is at bedside.  Planning for surgery tomorrow, per patient on left hip; will follow for PT/OT recommendations postoperatively.  Expected Discharge Plan: Skilled Nursing Facility Barriers to Discharge: Continued Medical Work up   Patient Goals and CMS Choice Patient states their goals for this hospitalization and ongoing recovery are:: to feel better      Expected Discharge Plan and Services Expected Discharge Plan: [place]   Discharge Planning Services: CM Consult   Living arrangements for the past 2 months: Single Family Home                                      Prior Living Arrangements/Services Living arrangements for the past 2 months: Single Family Home Lives with:: Spouse Patient language and need for interpreter reviewed:: Yes Do you feel safe going back to the place where you live?: Yes      Need for Family Participation in Patient Care: Yes (Comment) Care giver support system in place?: Yes (comment)   Criminal Activity/Legal Involvement Pertinent to Current Situation/Hospitalization: No - Comment as needed                 Emotional Assessment Appearance:: Appears stated age Attitude/Demeanor/Rapport: Engaged Affect (typically observed): Accepting Orientation: : Oriented to Self, Oriented to Place, Oriented to  Time, Oriented to Situation      Admission diagnosis:  SAH (subarachnoid hemorrhage) (HCC) [I60.9] SDH (subdural hematoma)  ([place]) [S06.5XAA] Closed displaced intertrochanteric fracture of left femur, initial encounter ([place]) [S72.142A] Multiple injuries due to trauma [T07.XXXA] Closed fracture of left wrist, initial encounter [S62.102A] Patient Active Problem List   Diagnosis Date Noted   Multiple injuries due to trauma 08/26/2021   Colon cancer screening 07/29/2021   Hyperlipidemia associated with type 2 diabetes mellitus ([place]) 11/28/2020   Acute recurrent pansinusitis 07/20/2020   Urinary retention 01/15/2020   Urinary urgency 01/15/2020   Abdominal cramping 08/21/2019   Protein-calorie malnutrition ([place]) 02/18/2019   Chronic pancreatitis ([place]) 08/24/2018   Loose stools 08/24/2018   Pancreatic calcification 08/22/2018   Acute pancreatitis without infection or necrosis 08/09/2018   Sigmoid diverticulosis 08/09/2018   3-vessel CAD 08/09/2018   Aortic atherosclerosis ([place]) 08/09/2018   Myelolipoma of right adrenal gland 08/09/2018   Renal cyst, right 08/09/2018   Hormone replacement therapy 01/12/2017   Hot flashes due to menopause 01/12/2017   Hypomagnesemia 06/27/2016   Diarrhea 03/28/2016   Vitamin D deficiency 10/03/2014   Left knee pain 07/02/2014   Hypertension    Gastroesophageal reflux disease    Type 2 diabetes mellitus with other specified complication ([place])    Allergy    Asthma    H/O: gout    Hyperlipidemia    PCP:  Celene Squibb, MD Pharmacy:   [place], [place] [address] Phone: [phone]  Fax: [place], [place] [address] Phone: [phone] Fax: [place], [place] [place] [address] Phone: [phone] Fax: [phone]     Social Determinants of Health (SDOH) Interventions    Readmission Risk Interventions      No data to display         Reinaldo Raddle, RN, BSN  Trauma/Neuro ICU Case Manager [phone]

## 2021-08-26 NOTE — Progress Notes (Signed)
Called in regards to this patients head CT last night which showed a very small SDH along the falx and a very small SAH. No mass effect or midline shift. No need for surgical intervention. Would not recommend follow up head CT unless she has a neurological change.

## 2021-08-26 NOTE — Progress Notes (Signed)
Admitted to 6N 21, alert and oriented. Trauma team made aware of the admission. VSS.

## 2021-08-26 NOTE — H&P (View-Only) (Signed)
Reason for Consult:Left hip and wrist fxs Referring Physician: Georganna Skeans Time called: [id number] Time at bedside: [place] is an 76 y.o. female.  HPI: Jennifer Porter let her dog out last night who got tangled with a possum on the deck. She tried to separate them, lost her balance, and fell. She had immediate left wrist and hip pain and could not get up. She was brought to the ED where x-ray showed left wrist and hip fxs in addition to other injuries and orthopedic surgery was consulted. She lives at home with her husband and is RHD.  Past Medical History:  Diagnosis Date   Allergy    seasonal   Asthma    Chronic pancreatitis ([place])    Complication of anesthesia    Diabetes mellitus without complication ([place])    GERD (gastroesophageal reflux disease)    hiatal hernia   H/O: gout    Hyperlipidemia    Hypertension    Pancreatitis    PONV (postoperative nausea and vomiting)     Past Surgical History:  Procedure Laterality Date   ABDOMINAL HYSTERECTOMY     APPENDECTOMY     Arthroscopic Left Knee Left 03/22/2003   CHOLECYSTECTOMY     in [place], 2010, without polyps   ESOPHAGOGASTRODUODENOSCOPY (EGD) WITH PROPOFOL N/A 11/08/2018   Procedure: ESOPHAGOGASTRODUODENOSCOPY (EGD) WITH PROPOFOL;  Surgeon: [name], MD;  Location: [facility];  Service: Endoscopy;  Laterality: N/A;   EUS N/A 11/08/2018   Procedure: UPPER ENDOSCOPIC ULTRASOUND (EUS) RADIAL;  Surgeon: [name], MD;  Location: [facility];  Service: Endoscopy;  Laterality: N/A;    Family History  Problem Relation Age of Onset   Stroke Mother    Diabetes Mother    Vision loss Mother        eye removed   Cancer Mother        cancer in eye - removed   Diabetes Brother    Heart disease Father    Colon cancer Sister    Uterine cancer Paternal Grandmother    Uterine cancer Maternal Aunt    Pancreatic cancer Maternal Aunt     Social History:  reports that she has  never smoked. She has never used smokeless tobacco. She reports that she does not drink alcohol and does not use drugs.  Allergies:  Allergies  Allergen Reactions   Codeine Nausea And Vomiting   Jardiance [Empagliflozin]    Macrobid [Nitrofurantoin] Hives   Simvastatin     headache    Medications: I have reviewed the patient's current medications.  Results for orders placed or performed during the hospital encounter of 08/26/21 (from the past 48 hour(s))  CBC with Differential/Platelet     Status: Abnormal   Collection Time: 08/26/21  4:04 AM  Result Value Ref Range   WBC 11.6 (H) 4.0 - 10.5 K/uL   RBC 3.55 (L) 3.87 - 5.11 MIL/uL   Hemoglobin 10.9 (L) 12.0 - 15.0 g/dL   HCT 33.3 (L) 36.0 - 46.0 %   MCV 93.8 80.0 - 100.0 fL   MCH 30.7 26.0 - 34.0 pg   MCHC 32.7 30.0 - 36.0 g/dL   RDW 13.0 11.5 - 15.5 %   Platelets 221 150 - 400 K/uL   nRBC 0.0 0.0 - 0.2 %   Neutrophils Relative % 84 %   Neutro Abs 9.9 (H) 1.7 - 7.7 K/uL   Lymphocytes Relative 8 %   Lymphs Abs  0.9 0.7 - 4.0 K/uL   Monocytes Relative 6 %   Monocytes Absolute 0.7 0.1 - 1.0 K/uL   Eosinophils Relative 0 %   Eosinophils Absolute 0.0 0.0 - 0.5 K/uL   Basophils Relative 1 %   Basophils Absolute 0.1 0.0 - 0.1 K/uL   Immature Granulocytes 1 %   Abs Immature Granulocytes 0.07 0.00 - 0.07 K/uL    Comment: Performed at [facility], [address] [place], [place] [address]  Basic metabolic panel     Status: Abnormal   Collection Time: 08/26/21  4:04 AM  Result Value Ref Range   Sodium 137 135 - 145 mmol/L    Comment: Electrolytes repeated to confirm.   Potassium 4.1 3.5 - 5.1 mmol/L   Chloride 110 98 - 111 mmol/L   CO2 25 22 - 32 mmol/L   Glucose, Bld 178 (H) 70 - 99 mg/dL    Comment: Glucose reference range applies only to samples taken after fasting for at least 8 hours.   BUN 24 (H) 8 - 23 mg/dL   Creatinine, Ser 0.66 0.44 - 1.00 mg/dL   Calcium 8.6 (L) 8.9 - 10.3 mg/dL   GFR, Estimated >60 >60 mL/min     Comment: (NOTE) Calculated using the CKD-EPI Creatinine Equation (2021)    Anion gap 2 (L) 5 - 15    Comment: Performed at [facility], [address] [place], [place] [address]  Protime-INR     Status: None   Collection Time: 08/26/21  4:04 AM  Result Value Ref Range   Prothrombin Time 13.5 11.4 - 15.2 seconds   INR 1.0 0.8 - 1.2    Comment: (NOTE) INR goal varies based on device and disease states. Performed at [facility], [address], [place], [place] [address]   CBG monitoring, ED     Status: Abnormal   Collection Time: 08/26/21  8:29 AM  Result Value Ref Range   Glucose-Capillary 146 (H) 70 - 99 mg/dL    Comment: Glucose reference range applies only to samples taken after fasting for at least 8 hours.    DG Hip Port Unilat W or [place] Pelvis 1 View Left  Result Date: 08/26/2021 CLINICAL DATA:  Recent fall with left hip pain, initial encounter EXAM: DG HIP (WITH OR WITHOUT PELVIS) 3V PORT LEFT COMPARISON:  None Available. FINDINGS: Pelvic ring is intact. Comminuted left intratrochanteric fracture is noted. No dislocation is seen. No other fractures are noted. IMPRESSION: Left intratrochanteric fracture. Electronically Signed   By: Inez Catalina M.D.   On: 08/26/2021 04:04   DG Femur Min 2 Views Left  Result Date: 08/26/2021 CLINICAL DATA:  Recent fall with left leg pain, initial encounter EXAM: LEFT FEMUR 2 VIEWS COMPARISON:  None Available. FINDINGS: Degenerative changes are noted about the left knee joint. Intratrochanteric fracture is noted of the left hip without significant displacement. No dislocation is noted. No other focal abnormality is seen. IMPRESSION: Intratrochanteric fracture of the left femur. Electronically Signed   By: Inez Catalina M.D.   On: 08/26/2021 04:03   DG Wrist Complete Left  Result Date: 08/26/2021 CLINICAL DATA:  Recent fall with left wrist pain, initial encounter EXAM: LEFT WRIST - COMPLETE 3+ VIEW COMPARISON:  None Available. FINDINGS: Comminuted distal  radial fracture is noted with intra-articular extension. Impaction is noted at the fracture site with associated soft tissue swelling. Comminuted ulnar styloid fracture is noted as well. Degenerative changes at the first [facility] joint are noted. IMPRESSION: Distal left radial  and ulnar fractures as described. Electronically Signed   By: Inez Catalina M.D.   On: 08/26/2021 04:02   DG Chest 1 View  Result Date: 08/26/2021 CLINICAL DATA:  Recent fall with chest pain, initial encounter EXAM: CHEST  1 VIEW COMPARISON:  None Available. FINDINGS: Cardiac shadow is within normal limits. Aortic calcifications are seen. Lungs are clear. No acute bony abnormality is noted. IMPRESSION: No active disease. Electronically Signed   By: Inez Catalina M.D.   On: 08/26/2021 04:01   CT Cervical Spine Wo Contrast  Result Date: 08/26/2021 CLINICAL DATA:  Fall EXAM: CT HEAD WITHOUT CONTRAST CT CERVICAL SPINE WITHOUT CONTRAST TECHNIQUE: Multidetector CT imaging of the head and cervical spine was performed following the standard protocol without intravenous contrast. Multiplanar CT image reconstructions of the cervical spine were also generated. RADIATION DOSE REDUCTION: This exam was performed according to the departmental dose-optimization program which includes automated exposure control, adjustment of the mA and/or kV according to patient size and/or use of iterative reconstruction technique. COMPARISON:  None Available. FINDINGS: CT HEAD FINDINGS Brain: There is a para falcine subdural hematoma that measures 4 mm in thickness. At the anterior left convexity there is a small focus of subarachnoid blood. No midline shift or other mass effect. The size and configuration of the ventricles and extra-axial CSF spaces are normal. There is hypoattenuation of the periventricular white matter, most commonly indicating chronic ischemic microangiopathy. Vascular: No abnormal hyperdensity of the major intracranial arteries or dural venous sinuses.  No intracranial atherosclerosis. Skull: Left parietal scalp hematoma.  No skull fracture. Sinuses/Orbits: No fluid levels or advanced mucosal thickening of the visualized paranasal sinuses. No mastoid or middle ear effusion. The orbits are normal. CT CERVICAL SPINE FINDINGS Alignment: Grade 1 anterolisthesis at C6-7. Skull base and vertebrae: No acute fracture. Soft tissues and spinal canal: No prevertebral fluid or swelling. No visible canal hematoma. Disc levels: No advanced spinal canal or neural foraminal stenosis. Upper chest: No pneumothorax, pulmonary nodule or pleural effusion. Other: Normal visualized paraspinal cervical soft tissues. IMPRESSION: 1. Small parafalcine subdural hematoma without midline shift or other mass effect. 2. Small focus of subarachnoid blood at the anterior left convexity. 3. Left parietal scalp hematoma without skull fracture. 4. No acute fracture or static subluxation of the cervical spine. Critical Value/emergent results were called by telephone at the time of interpretation on 08/26/2021 at 3:12 am to provider [facility] , who verbally acknowledged these results. Electronically Signed   By: Ulyses Jarred M.D.   On: 08/26/2021 03:38   CT HEAD WO CONTRAST (5MM)  Result Date: 08/26/2021 CLINICAL DATA:  Fall EXAM: CT HEAD WITHOUT CONTRAST CT CERVICAL SPINE WITHOUT CONTRAST TECHNIQUE: Multidetector CT imaging of the head and cervical spine was performed following the standard protocol without intravenous contrast. Multiplanar CT image reconstructions of the cervical spine were also generated. RADIATION DOSE REDUCTION: This exam was performed according to the departmental dose-optimization program which includes automated exposure control, adjustment of the mA and/or kV according to patient size and/or use of iterative reconstruction technique. COMPARISON:  None Available. FINDINGS: CT HEAD FINDINGS Brain: There is a para falcine subdural hematoma that measures 4 mm in  thickness. At the anterior left convexity there is a small focus of subarachnoid blood. No midline shift or other mass effect. The size and configuration of the ventricles and extra-axial CSF spaces are normal. There is hypoattenuation of the periventricular white matter, most commonly indicating chronic ischemic microangiopathy. Vascular: No abnormal hyperdensity of the major  intracranial arteries or dural venous sinuses. No intracranial atherosclerosis. Skull: Left parietal scalp hematoma.  No skull fracture. Sinuses/Orbits: No fluid levels or advanced mucosal thickening of the visualized paranasal sinuses. No mastoid or middle ear effusion. The orbits are normal. CT CERVICAL SPINE FINDINGS Alignment: Grade 1 anterolisthesis at C6-7. Skull base and vertebrae: No acute fracture. Soft tissues and spinal canal: No prevertebral fluid or swelling. No visible canal hematoma. Disc levels: No advanced spinal canal or neural foraminal stenosis. Upper chest: No pneumothorax, pulmonary nodule or pleural effusion. Other: Normal visualized paraspinal cervical soft tissues. IMPRESSION: 1. Small parafalcine subdural hematoma without midline shift or other mass effect. 2. Small focus of subarachnoid blood at the anterior left convexity. 3. Left parietal scalp hematoma without skull fracture. 4. No acute fracture or static subluxation of the cervical spine. Critical Value/emergent results were called by telephone at the time of interpretation on 08/26/2021 at 3:12 am to provider [facility] , who verbally acknowledged these results. Electronically Signed   By: Ulyses Jarred M.D.   On: 08/26/2021 03:38    Review of Systems  HENT:  Negative for ear discharge, ear pain, hearing loss and tinnitus.   Eyes:  Negative for photophobia and pain.  Respiratory:  Negative for cough and shortness of breath.   Cardiovascular:  Negative for chest pain.  Gastrointestinal:  Negative for abdominal pain, nausea and vomiting.   Genitourinary:  Negative for dysuria, flank pain, frequency and urgency.  Musculoskeletal:  Positive for arthralgias (Left wrist and hip). Negative for back pain, myalgias and neck pain.  Neurological:  Positive for headaches. Negative for dizziness.  Hematological:  Does not bruise/bleed easily.  Psychiatric/Behavioral:  The patient is not nervous/anxious.    Blood pressure 133/62, pulse 79, temperature 97.9 F (36.6 C), temperature source Oral, resp. rate 15, height '5\' 4"'$  (1.626 m), weight 55.8 kg, SpO2 100 %. Physical Exam Constitutional:      General: She is not in acute distress.    Appearance: She is well-developed. She is not diaphoretic.  HENT:     Head: Normocephalic and atraumatic.  Eyes:     General: No scleral icterus.       Right eye: No discharge.        Left eye: No discharge.     Conjunctiva/sclera: Conjunctivae normal.  Cardiovascular:     Rate and Rhythm: Normal rate and regular rhythm.  Pulmonary:     Effort: Pulmonary effort is normal. No respiratory distress.  Musculoskeletal:     Cervical back: Normal range of motion.     Comments: Left shoulder, elbow, wrist, digits- Superficial abrasion radial wrist, severe TTP with slight deformity, no instability, no blocks to motion  Sens  Ax/R/M/U intact  Mot   Ax/ R/ PIN/ M/ AIN/ U intact  Rad 2+  LLE No traumatic wounds, ecchymosis, or rash  Mod TTP hip and knee  No knee or ankle effusion  Sens DPN, SPN, TN intact  Motor EHL, ext, flex, evers 5/5  DP 1+, PT 0, No significant edema  Skin:    General: Skin is warm and dry.  Neurological:     Mental Status: She is alert.  Psychiatric:        Mood and Affect: Mood normal.        Behavior: Behavior normal.     Assessment/Plan: Left wrist fx -- Plan ORIF tomorrow with Dr. Doreatha Martin. Left hip fx -- Plan IMN tomorrow.    Lisette Abu, PA-C Orthopedic Surgery [phone] 08/26/2021,  10:46 AM

## 2021-08-26 NOTE — H&P (Signed)
[facility] Surgery Admission Note  AHRIANNA SIGLIN 03-21-1946  [id number].    Requesting MD: Joseph Berkshire Chief Complaint/Reason for Consult: fall  HPI:  MARYSOL WELLNITZ is a 76 y.o. female who was transferred from [facility] to [facility] for admission to the trauma service after suffering a ground level fall. States that she was on her deck last night around 2300 when she was trying to get her dog away from a possom and fell backwards. She struck her head, left hand, and left hip. No LOC. Yelled for help and her neighbor came and called EMS. In the ED she was found to have SAH/SDH, Left wrist fracture, and Left intratrochanteric femur fracture. Currently complaining of headache, left wrist and left hip pain. Denies neck pain, chest pain, SOB, abdominal pain, back pain, n/t to BUE/BLE. Having some nausea.  Anticoagulants: none Nonsmoker Denies alcohol or illicit drug use Lives at home with husband Ambulates without assistive device  Family History  Problem Relation Age of Onset   Stroke Mother    Diabetes Mother    Vision loss Mother        eye removed   Cancer Mother        cancer in eye - removed   Diabetes Brother    Heart disease Father    Colon cancer Sister    Uterine cancer Paternal Grandmother    Uterine cancer Maternal Aunt    Pancreatic cancer Maternal Aunt     Past Medical History:  Diagnosis Date   Allergy    seasonal   Asthma    Chronic pancreatitis ([place])    Complication of anesthesia    Diabetes mellitus without complication ([place])    GERD (gastroesophageal reflux disease)    hiatal hernia   H/O: gout    Hyperlipidemia    Hypertension    Pancreatitis    PONV (postoperative nausea and vomiting)     Past Surgical History:  Procedure Laterality Date   ABDOMINAL HYSTERECTOMY     APPENDECTOMY     Arthroscopic Left Knee Left 03/22/2003   CHOLECYSTECTOMY     in [place], 2010, without polyps    ESOPHAGOGASTRODUODENOSCOPY (EGD) WITH PROPOFOL N/A 11/08/2018   Procedure: ESOPHAGOGASTRODUODENOSCOPY (EGD) WITH PROPOFOL;  Surgeon: [name], MD;  Location: [facility];  Service: Endoscopy;  Laterality: N/A;   EUS N/A 11/08/2018   Procedure: UPPER ENDOSCOPIC ULTRASOUND (EUS) RADIAL;  Surgeon: [name], MD;  Location: [facility];  Service: Endoscopy;  Laterality: N/A;    Social History:  reports that she has never smoked. She has never used smokeless tobacco. She reports that she does not drink alcohol and does not use drugs.  Allergies:  Allergies  Allergen Reactions   Codeine Nausea And Vomiting   Jardiance [Empagliflozin]    Macrobid [Nitrofurantoin] Hives   Simvastatin     headache    Medications Prior to Admission  Medication Sig Dispense Refill   aspirin EC 81 MG tablet Take 81 mg by mouth daily.     Cholecalciferol (VITAMIN D3) 125 MCG (5000 UT) TABS Take 5,000 Units by mouth daily.     Choline Fenofibrate (FENOFIBRIC ACID) 135 MG CPDR Take 1 capsule by mouth at bedtime. 90 capsule 2   CREON 36000-114000 units CPEP capsule TAKE TWO CAPSULES BY MOUTH WITH MEALS AND ONE WITH SNACKS (UP TO 8 A DAY) 240 capsule 11   dicyclomine (BENTYL) 10 MG capsule Take one capsule up  to twice daily for abdominal cramping or loose stools. 60 capsule 3   estradiol (ESTRACE) 0.5 MG tablet TAKE ONE TABLET BY MOUTH DAILY 90 tablet 3   fluticasone (FLONASE) 50 MCG/ACT nasal spray Place 2 sprays into both nostrils daily as needed. (Patient taking differently: Place 2 sprays into both nostrils daily as needed for allergies.) 16 g 5   gabapentin (NEURONTIN) 300 MG capsule Take 1 capsule (300 mg total) by mouth at bedtime. 90 capsule 1   LEVEMIR FLEXTOUCH 100 UNIT/ML FlexTouch Pen INJECT 5 TO 50 UNITS INTO THE SKIN AT BEDTIME NIGHTLY (Patient taking differently: 5 Units at bedtime.) 15 mL 0   lisinopril (ZESTRIL) 20 MG tablet TAKE ONE (1) TABLET BY MOUTH EVERY DAY 90 tablet 3   Magnesium  Oxide, Elemental, 400 MG TABS Take 1 tablet by mouth in the morning and at bedtime. (Patient taking differently: Take 1 tablet by mouth in the morning and at bedtime. Takes BID per pt) 180 tablet 3   metFORMIN (GLUCOPHAGE) 500 MG tablet Take 2 tablets (1,000 mg total) by mouth 2 (two) times daily with a meal. TAKE TWO (2) TABLETS BY MOUTH 2 TIMES DAILY WITH A MEAL 360 tablet 3   pantoprazole (PROTONIX) 40 MG tablet Take one tablet once or twice daily, 30 minutes before a meal. 180 tablet 3   pravastatin (PRAVACHOL) 10 MG tablet TAKE ONE TABLET ('10MG'$  TOTAL) BY MOUTH ATBEDTIME (Patient taking differently: Take 10 mg by mouth at bedtime.) 90 tablet 0   sodium chloride (OCEAN) 0.65 % SOLN nasal spray Place 1 spray into both nostrils as needed for congestion. 88 mL 0   BD PEN NEEDLE NANO U/F 32G X 4 MM MISC USE UP TO TWICE DAILY 100 each 1   Diclofenac Sodium CR 100 MG 24 hr tablet Take 1 tablet (100 mg total) by mouth daily as needed for pain. (Patient not taking: Reported on 08/26/2021) 90 tablet 0    Prior to Admission medications   Medication Sig Start Date End Date Taking? Authorizing Provider  aspirin EC 81 MG tablet Take 81 mg by mouth daily.   Yes [provider]  Cholecalciferol (VITAMIN D3) 125 MCG (5000 UT) TABS Take 5,000 Units by mouth daily.   Yes [provider]  Choline Fenofibrate (FENOFIBRIC ACID) 135 MG CPDR Take 1 capsule by mouth at bedtime. 03/29/21  Yes Eulogio Bear, NP  CREON [phone] units CPEP capsule TAKE TWO CAPSULES BY MOUTH WITH MEALS AND ONE WITH SNACKS (UP TO 8 A DAY) 12/28/20  Yes Aliene Altes S, PA-C  dicyclomine (BENTYL) 10 MG capsule Take one capsule up to twice daily for abdominal cramping or loose stools. 08/21/19  Yes Mahala Menghini, PA-C  estradiol (ESTRACE) 0.5 MG tablet TAKE ONE TABLET BY MOUTH DAILY 09/08/20  Yes Noemi Chapel A, NP  fluticasone (FLONASE) 50 MCG/ACT nasal spray Place 2 sprays into both nostrils daily as  needed. Patient taking differently: Place 2 sprays into both nostrils daily as needed for allergies. 01/28/20  Yes Basin, Modena Nunnery, MD  gabapentin (NEURONTIN) 300 MG capsule Take 1 capsule (300 mg total) by mouth at bedtime. 03/29/21  Yes Eulogio Bear, NP  LEVEMIR FLEXTOUCH 100 UNIT/ML FlexTouch Pen INJECT 5 TO 50 UNITS INTO THE SKIN AT BEDTIME NIGHTLY Patient taking differently: 5 Units at bedtime. 01/26/21  Yes Noemi Chapel A, NP  lisinopril (ZESTRIL) 20 MG tablet TAKE ONE (1) TABLET BY MOUTH EVERY DAY 01/28/21  Yes Susy Frizzle, MD  Magnesium Oxide, Elemental, 400 MG TABS Take 1 tablet by mouth in the morning and at bedtime. Patient taking differently: Take 1 tablet by mouth in the morning and at bedtime. Takes BID per pt 02/25/20  Yes Cushman, Modena Nunnery, MD  metFORMIN (GLUCOPHAGE) 500 MG tablet Take 2 tablets (1,000 mg total) by mouth 2 (two) times daily with a meal. TAKE TWO (2) TABLETS BY MOUTH 2 TIMES DAILY WITH A MEAL 09/08/20  Yes Eulogio Bear, NP  pantoprazole (PROTONIX) 40 MG tablet Take one tablet once or twice daily, 30 minutes before a meal. 07/29/21  Yes Harper, Kristen S, PA-C  pravastatin (PRAVACHOL) 10 MG tablet TAKE ONE TABLET ('10MG'$  TOTAL) BY MOUTH ATBEDTIME Patient taking differently: Take 10 mg by mouth at bedtime. 01/11/21  Yes Noemi Chapel A, NP  sodium chloride (OCEAN) 0.65 % SOLN nasal spray Place 1 spray into both nostrils as needed for congestion. 07/20/20  Yes Eulogio Bear, NP  BD PEN NEEDLE NANO U/F 32G X 4 MM MISC USE UP TO TWICE DAILY 02/10/21   Noemi Chapel A, NP  Diclofenac Sodium CR 100 MG 24 hr tablet Take 1 tablet (100 mg total) by mouth daily as needed for pain. Patient not taking: Reported on 08/26/2021 07/20/20   Noemi Chapel A, NP    Blood pressure 133/62, pulse 79, temperature 97.9 F (36.6 C), temperature source Oral, resp. rate 15, height '5\' 4"'$  (1.626 m), weight 55.8 kg, SpO2 100 %. Physical Exam: General: pleasant,  WD/WN female who is laying in bed in NAD HEENT: head is normocephalic, atraumatic.  Sclera are noninjected.  Pupils equal and round and reactive to light.  Ears and nose without any masses or lesions.  Mouth is pink and moist. Dentition fair Heart: regular, rate, and rhythm.  Normal s1,s2. No obvious murmurs, gallops, or rubs noted.  Palpable radial and pedal pulses bilaterally  Lungs: CTAB, no wheezes, rhonchi, or rales noted.  Respiratory effort nonlabored on 1L [place] Abd: soft, NT/ND, +BS, no masses, hernias, or organomegaly MS: no BLE edema, calves soft and nontender. Edema noted to left wrist, wrapped in pillow, NVI Skin: warm and dry with no masses, lesions, or rashes Psych: A&Ox4 with an appropriate affect Neuro: no gross motor or sensory deficits BUE/BLE  Results for orders placed or performed during the hospital encounter of 08/26/21 (from the past 48 hour(s))  CBC with Differential/Platelet     Status: Abnormal   Collection Time: 08/26/21  4:04 AM  Result Value Ref Range   WBC 11.6 (H) 4.0 - 10.5 K/uL   RBC 3.55 (L) 3.87 - 5.11 MIL/uL   Hemoglobin 10.9 (L) 12.0 - 15.0 g/dL   HCT 33.3 (L) 36.0 - 46.0 %   MCV 93.8 80.0 - 100.0 fL   MCH 30.7 26.0 - 34.0 pg   MCHC 32.7 30.0 - 36.0 g/dL   RDW 13.0 11.5 - 15.5 %   Platelets 221 150 - 400 K/uL   nRBC 0.0 0.0 - 0.2 %   Neutrophils Relative % 84 %   Neutro Abs 9.9 (H) 1.7 - 7.7 K/uL   Lymphocytes Relative 8 %   Lymphs Abs 0.9 0.7 - 4.0 K/uL   Monocytes Relative 6 %   Monocytes Absolute 0.7 0.1 - 1.0 K/uL   Eosinophils Relative 0 %   Eosinophils Absolute 0.0 0.0 - 0.5 K/uL   Basophils Relative 1 %   Basophils Absolute 0.1 0.0 - 0.1 K/uL   Immature Granulocytes 1 %   Abs  Immature Granulocytes 0.07 0.00 - 0.07 K/uL    Comment: Performed at [facility], [address] [place], [place] [address]  Basic metabolic panel     Status: Abnormal   Collection Time: 08/26/21  4:04 AM  Result Value Ref Range   Sodium 137 135 - 145 mmol/L     Comment: Electrolytes repeated to confirm.   Potassium 4.1 3.5 - 5.1 mmol/L   Chloride 110 98 - 111 mmol/L   CO2 25 22 - 32 mmol/L   Glucose, Bld 178 (H) 70 - 99 mg/dL    Comment: Glucose reference range applies only to samples taken after fasting for at least 8 hours.   BUN 24 (H) 8 - 23 mg/dL   Creatinine, Ser 0.66 0.44 - 1.00 mg/dL   Calcium 8.6 (L) 8.9 - 10.3 mg/dL   GFR, Estimated >60 >60 mL/min    Comment: (NOTE) Calculated using the CKD-EPI Creatinine Equation (2021)    Anion gap 2 (L) 5 - 15    Comment: Performed at [facility], [address], [place], [place] [address]  Protime-INR     Status: None   Collection Time: 08/26/21  4:04 AM  Result Value Ref Range   Prothrombin Time 13.5 11.4 - 15.2 seconds   INR 1.0 0.8 - 1.2    Comment: (NOTE) INR goal varies based on device and disease states. Performed at [facility], [address] [place], [place] [address]   CBG monitoring, ED     Status: Abnormal   Collection Time: 08/26/21  8:29 AM  Result Value Ref Range   Glucose-Capillary 146 (H) 70 - 99 mg/dL    Comment: Glucose reference range applies only to samples taken after fasting for at least 8 hours.   DG Hip Port Unilat W or [place] Pelvis 1 View Left  Result Date: 08/26/2021 CLINICAL DATA:  Recent fall with left hip pain, initial encounter EXAM: DG HIP (WITH OR WITHOUT PELVIS) 3V PORT LEFT COMPARISON:  None Available. FINDINGS: Pelvic ring is intact. Comminuted left intratrochanteric fracture is noted. No dislocation is seen. No other fractures are noted. IMPRESSION: Left intratrochanteric fracture. Electronically Signed   By: Inez Catalina M.D.   On: 08/26/2021 04:04   DG Femur Min 2 Views Left  Result Date: 08/26/2021 CLINICAL DATA:  Recent fall with left leg pain, initial encounter EXAM: LEFT FEMUR 2 VIEWS COMPARISON:  None Available. FINDINGS: Degenerative changes are noted about the left knee joint. Intratrochanteric fracture is noted of the left hip without significant  displacement. No dislocation is noted. No other focal abnormality is seen. IMPRESSION: Intratrochanteric fracture of the left femur. Electronically Signed   By: Inez Catalina M.D.   On: 08/26/2021 04:03   DG Wrist Complete Left  Result Date: 08/26/2021 CLINICAL DATA:  Recent fall with left wrist pain, initial encounter EXAM: LEFT WRIST - COMPLETE 3+ VIEW COMPARISON:  None Available. FINDINGS: Comminuted distal radial fracture is noted with intra-articular extension. Impaction is noted at the fracture site with associated soft tissue swelling. Comminuted ulnar styloid fracture is noted as well. Degenerative changes at the first [facility] joint are noted. IMPRESSION: Distal left radial and ulnar fractures as described. Electronically Signed   By: Inez Catalina M.D.   On: 08/26/2021 04:02   DG Chest 1 View  Result Date: 08/26/2021 CLINICAL DATA:  Recent fall with chest pain, initial encounter EXAM: CHEST  1 VIEW COMPARISON:  None Available. FINDINGS: Cardiac shadow is within normal limits. Aortic calcifications are seen. Lungs  are clear. No acute bony abnormality is noted. IMPRESSION: No active disease. Electronically Signed   By: Inez Catalina M.D.   On: 08/26/2021 04:01   CT Cervical Spine Wo Contrast  Result Date: 08/26/2021 CLINICAL DATA:  Fall EXAM: CT HEAD WITHOUT CONTRAST CT CERVICAL SPINE WITHOUT CONTRAST TECHNIQUE: Multidetector CT imaging of the head and cervical spine was performed following the standard protocol without intravenous contrast. Multiplanar CT image reconstructions of the cervical spine were also generated. RADIATION DOSE REDUCTION: This exam was performed according to the departmental dose-optimization program which includes automated exposure control, adjustment of the mA and/or kV according to patient size and/or use of iterative reconstruction technique. COMPARISON:  None Available. FINDINGS: CT HEAD FINDINGS Brain: There is a para falcine subdural hematoma that measures 4 mm in thickness.  At the anterior left convexity there is a small focus of subarachnoid blood. No midline shift or other mass effect. The size and configuration of the ventricles and extra-axial CSF spaces are normal. There is hypoattenuation of the periventricular white matter, most commonly indicating chronic ischemic microangiopathy. Vascular: No abnormal hyperdensity of the major intracranial arteries or dural venous sinuses. No intracranial atherosclerosis. Skull: Left parietal scalp hematoma.  No skull fracture. Sinuses/Orbits: No fluid levels or advanced mucosal thickening of the visualized paranasal sinuses. No mastoid or middle ear effusion. The orbits are normal. CT CERVICAL SPINE FINDINGS Alignment: Grade 1 anterolisthesis at C6-7. Skull base and vertebrae: No acute fracture. Soft tissues and spinal canal: No prevertebral fluid or swelling. No visible canal hematoma. Disc levels: No advanced spinal canal or neural foraminal stenosis. Upper chest: No pneumothorax, pulmonary nodule or pleural effusion. Other: Normal visualized paraspinal cervical soft tissues. IMPRESSION: 1. Small parafalcine subdural hematoma without midline shift or other mass effect. 2. Small focus of subarachnoid blood at the anterior left convexity. 3. Left parietal scalp hematoma without skull fracture. 4. No acute fracture or static subluxation of the cervical spine. Critical Value/emergent results were called by telephone at the time of interpretation on 08/26/2021 at 3:12 am to provider [facility] , who verbally acknowledged these results. Electronically Signed   By: Ulyses Jarred M.D.   On: 08/26/2021 03:38   CT HEAD WO CONTRAST (5MM)  Result Date: 08/26/2021 CLINICAL DATA:  Fall EXAM: CT HEAD WITHOUT CONTRAST CT CERVICAL SPINE WITHOUT CONTRAST TECHNIQUE: Multidetector CT imaging of the head and cervical spine was performed following the standard protocol without intravenous contrast. Multiplanar CT image reconstructions of the cervical  spine were also generated. RADIATION DOSE REDUCTION: This exam was performed according to the departmental dose-optimization program which includes automated exposure control, adjustment of the mA and/or kV according to patient size and/or use of iterative reconstruction technique. COMPARISON:  None Available. FINDINGS: CT HEAD FINDINGS Brain: There is a para falcine subdural hematoma that measures 4 mm in thickness. At the anterior left convexity there is a small focus of subarachnoid blood. No midline shift or other mass effect. The size and configuration of the ventricles and extra-axial CSF spaces are normal. There is hypoattenuation of the periventricular white matter, most commonly indicating chronic ischemic microangiopathy. Vascular: No abnormal hyperdensity of the major intracranial arteries or dural venous sinuses. No intracranial atherosclerosis. Skull: Left parietal scalp hematoma.  No skull fracture. Sinuses/Orbits: No fluid levels or advanced mucosal thickening of the visualized paranasal sinuses. No mastoid or middle ear effusion. The orbits are normal. CT CERVICAL SPINE FINDINGS Alignment: Grade 1 anterolisthesis at C6-7. Skull base and vertebrae: No acute fracture. Soft tissues  and spinal canal: No prevertebral fluid or swelling. No visible canal hematoma. Disc levels: No advanced spinal canal or neural foraminal stenosis. Upper chest: No pneumothorax, pulmonary nodule or pleural effusion. Other: Normal visualized paraspinal cervical soft tissues. IMPRESSION: 1. Small parafalcine subdural hematoma without midline shift or other mass effect. 2. Small focus of subarachnoid blood at the anterior left convexity. 3. Left parietal scalp hematoma without skull fracture. 4. No acute fracture or static subluxation of the cervical spine. Critical Value/emergent results were called by telephone at the time of interpretation on 08/26/2021 at 3:12 am to provider [facility] , who verbally acknowledged  these results. Electronically Signed   By: Ulyses Jarred M.D.   On: 08/26/2021 03:38      Assessment/Plan Fall SAH/SDH - tiny on initial CT. Discussed with Maudie Mercury of neurosurgery, no need to repeat CT unless change in neuro exam. Keppra x7 days Left wrist fx - ortho to see Left intratrochanteric femur fx - ortho to see HTN - home lisinopril HLD GERD - protonix Chronic pancreatitis - home creon DM - SSI  ID - none VTE - SCDs only FEN - IVF, CLD Foley - none  Dispo - Ortho consult pending - tentatively planning for surgery tomorrow. Will need TBI team therapies.  I reviewed ED provider notes, last 24 h vitals and pain scores, last 48 h intake and output, last 24 h labs and trends, and last 24 h imaging results.   Wellington Hampshire, [place] Surgery 08/26/2021, 10:32 AM Please see Amion for pager number during day hours 7:00am-4:30pm

## 2021-08-26 NOTE — ED Notes (Signed)
Allegra Lai, daughter, [phone] with updates

## 2021-08-26 NOTE — Consult Note (Signed)
Reason for Consult:Left hip and wrist fxs Referring Physician: Georganna Skeans Time called: [id number] Time at bedside: [place] is an 76 y.o. female.  HPI: Jennifer Porter let her dog out last night who got tangled with a possum on the deck. Jennifer Porter tried to separate them, lost her balance, and fell. Jennifer Porter had immediate left wrist and hip pain and could not get up. Jennifer Porter was brought to the ED where x-ray showed left wrist and hip fxs in addition to other injuries and orthopedic surgery was consulted. Jennifer Porter lives at home with her husband and is RHD.  Past Medical History:  Diagnosis Date   Allergy    seasonal   Asthma    Chronic pancreatitis ([place])    Complication of anesthesia    Diabetes mellitus without complication ([place])    GERD (gastroesophageal reflux disease)    hiatal hernia   H/O: gout    Hyperlipidemia    Hypertension    Pancreatitis    PONV (postoperative nausea and vomiting)     Past Surgical History:  Procedure Laterality Date   ABDOMINAL HYSTERECTOMY     APPENDECTOMY     Arthroscopic Left Knee Left 03/22/2003   CHOLECYSTECTOMY     in [place], 2010, without polyps   ESOPHAGOGASTRODUODENOSCOPY (EGD) WITH PROPOFOL N/A 11/08/2018   Procedure: ESOPHAGOGASTRODUODENOSCOPY (EGD) WITH PROPOFOL;  Surgeon: [name], MD;  Location: [facility];  Service: Endoscopy;  Laterality: N/A;   EUS N/A 11/08/2018   Procedure: UPPER ENDOSCOPIC ULTRASOUND (EUS) RADIAL;  Surgeon: [name], MD;  Location: [facility];  Service: Endoscopy;  Laterality: N/A;    Family History  Problem Relation Age of Onset   Stroke Mother    Diabetes Mother    Vision loss Mother        eye removed   Cancer Mother        cancer in eye - removed   Diabetes Brother    Heart disease Father    Colon cancer Sister    Uterine cancer Paternal Grandmother    Uterine cancer Maternal Aunt    Pancreatic cancer Maternal Aunt     Social History:  reports that Jennifer Porter has  never smoked. Jennifer Porter has never used smokeless tobacco. Jennifer Porter reports that Jennifer Porter does not drink alcohol and does not use drugs.  Allergies:  Allergies  Allergen Reactions   Codeine Nausea And Vomiting   Jardiance [Empagliflozin]    Macrobid [Nitrofurantoin] Hives   Simvastatin     headache    Medications: I have reviewed the patient's current medications.  Results for orders placed or performed during the hospital encounter of 08/26/21 (from the past 48 hour(s))  CBC with Differential/Platelet     Status: Abnormal   Collection Time: 08/26/21  4:04 AM  Result Value Ref Range   WBC 11.6 (H) 4.0 - 10.5 K/uL   RBC 3.55 (L) 3.87 - 5.11 MIL/uL   Hemoglobin 10.9 (L) 12.0 - 15.0 g/dL   HCT 33.3 (L) 36.0 - 46.0 %   MCV 93.8 80.0 - 100.0 fL   MCH 30.7 26.0 - 34.0 pg   MCHC 32.7 30.0 - 36.0 g/dL   RDW 13.0 11.5 - 15.5 %   Platelets 221 150 - 400 K/uL   nRBC 0.0 0.0 - 0.2 %   Neutrophils Relative % 84 %   Neutro Abs 9.9 (H) 1.7 - 7.7 K/uL   Lymphocytes Relative 8 %   Lymphs Abs  0.9 0.7 - 4.0 K/uL   Monocytes Relative 6 %   Monocytes Absolute 0.7 0.1 - 1.0 K/uL   Eosinophils Relative 0 %   Eosinophils Absolute 0.0 0.0 - 0.5 K/uL   Basophils Relative 1 %   Basophils Absolute 0.1 0.0 - 0.1 K/uL   Immature Granulocytes 1 %   Abs Immature Granulocytes 0.07 0.00 - 0.07 K/uL    Comment: Performed at [facility], [address], [place], [place] [address]  Basic metabolic panel     Status: Abnormal   Collection Time: 08/26/21  4:04 AM  Result Value Ref Range   Sodium 137 135 - 145 mmol/L    Comment: Electrolytes repeated to confirm.   Potassium 4.1 3.5 - 5.1 mmol/L   Chloride 110 98 - 111 mmol/L   CO2 25 22 - 32 mmol/L   Glucose, Bld 178 (H) 70 - 99 mg/dL    Comment: Glucose reference range applies only to samples taken after fasting for at least 8 hours.   BUN 24 (H) 8 - 23 mg/dL   Creatinine, Ser 0.66 0.44 - 1.00 mg/dL   Calcium 8.6 (L) 8.9 - 10.3 mg/dL   GFR, Estimated >60 >60 mL/min     Comment: (NOTE) Calculated using the CKD-EPI Creatinine Equation (2021)    Anion gap 2 (L) 5 - 15    Comment: Performed at [facility], [address] [place], [place] [address]  Protime-INR     Status: None   Collection Time: 08/26/21  4:04 AM  Result Value Ref Range   Prothrombin Time 13.5 11.4 - 15.2 seconds   INR 1.0 0.8 - 1.2    Comment: (NOTE) INR goal varies based on device and disease states. Performed at [facility], [address], [place], [place] [address]   CBG monitoring, ED     Status: Abnormal   Collection Time: 08/26/21  8:29 AM  Result Value Ref Range   Glucose-Capillary 146 (H) 70 - 99 mg/dL    Comment: Glucose reference range applies only to samples taken after fasting for at least 8 hours.    DG Hip Port Unilat W or [place] Pelvis 1 View Left  Result Date: 08/26/2021 CLINICAL DATA:  Recent fall with left hip pain, initial encounter EXAM: DG HIP (WITH OR WITHOUT PELVIS) 3V PORT LEFT COMPARISON:  None Available. FINDINGS: Pelvic ring is intact. Comminuted left intratrochanteric fracture is noted. No dislocation is seen. No other fractures are noted. IMPRESSION: Left intratrochanteric fracture. Electronically Signed   By: Inez Catalina M.D.   On: 08/26/2021 04:04   DG Femur Min 2 Views Left  Result Date: 08/26/2021 CLINICAL DATA:  Recent fall with left leg pain, initial encounter EXAM: LEFT FEMUR 2 VIEWS COMPARISON:  None Available. FINDINGS: Degenerative changes are noted about the left knee joint. Intratrochanteric fracture is noted of the left hip without significant displacement. No dislocation is noted. No other focal abnormality is seen. IMPRESSION: Intratrochanteric fracture of the left femur. Electronically Signed   By: Inez Catalina M.D.   On: 08/26/2021 04:03   DG Wrist Complete Left  Result Date: 08/26/2021 CLINICAL DATA:  Recent fall with left wrist pain, initial encounter EXAM: LEFT WRIST - COMPLETE 3+ VIEW COMPARISON:  None Available. FINDINGS: Comminuted distal  radial fracture is noted with intra-articular extension. Impaction is noted at the fracture site with associated soft tissue swelling. Comminuted ulnar styloid fracture is noted as well. Degenerative changes at the first [facility] joint are noted. IMPRESSION: Distal left radial  and ulnar fractures as described. Electronically Signed   By: Inez Catalina M.D.   On: 08/26/2021 04:02   DG Chest 1 View  Result Date: 08/26/2021 CLINICAL DATA:  Recent fall with chest pain, initial encounter EXAM: CHEST  1 VIEW COMPARISON:  None Available. FINDINGS: Cardiac shadow is within normal limits. Aortic calcifications are seen. Lungs are clear. No acute bony abnormality is noted. IMPRESSION: No active disease. Electronically Signed   By: Inez Catalina M.D.   On: 08/26/2021 04:01   CT Cervical Spine Wo Contrast  Result Date: 08/26/2021 CLINICAL DATA:  Fall EXAM: CT HEAD WITHOUT CONTRAST CT CERVICAL SPINE WITHOUT CONTRAST TECHNIQUE: Multidetector CT imaging of the head and cervical spine was performed following the standard protocol without intravenous contrast. Multiplanar CT image reconstructions of the cervical spine were also generated. RADIATION DOSE REDUCTION: This exam was performed according to the departmental dose-optimization program which includes automated exposure control, adjustment of the mA and/or kV according to patient size and/or use of iterative reconstruction technique. COMPARISON:  None Available. FINDINGS: CT HEAD FINDINGS Brain: There is a para falcine subdural hematoma that measures 4 mm in thickness. At the anterior left convexity there is a small focus of subarachnoid blood. No midline shift or other mass effect. The size and configuration of the ventricles and extra-axial CSF spaces are normal. There is hypoattenuation of the periventricular white matter, most commonly indicating chronic ischemic microangiopathy. Vascular: No abnormal hyperdensity of the major intracranial arteries or dural venous sinuses.  No intracranial atherosclerosis. Skull: Left parietal scalp hematoma.  No skull fracture. Sinuses/Orbits: No fluid levels or advanced mucosal thickening of the visualized paranasal sinuses. No mastoid or middle ear effusion. The orbits are normal. CT CERVICAL SPINE FINDINGS Alignment: Grade 1 anterolisthesis at C6-7. Skull base and vertebrae: No acute fracture. Soft tissues and spinal canal: No prevertebral fluid or swelling. No visible canal hematoma. Disc levels: No advanced spinal canal or neural foraminal stenosis. Upper chest: No pneumothorax, pulmonary nodule or pleural effusion. Other: Normal visualized paraspinal cervical soft tissues. IMPRESSION: 1. Small parafalcine subdural hematoma without midline shift or other mass effect. 2. Small focus of subarachnoid blood at the anterior left convexity. 3. Left parietal scalp hematoma without skull fracture. 4. No acute fracture or static subluxation of the cervical spine. Critical Value/emergent results were called by telephone at the time of interpretation on 08/26/2021 at 3:12 am to provider [facility] , who verbally acknowledged these results. Electronically Signed   By: Ulyses Jarred M.D.   On: 08/26/2021 03:38   CT HEAD WO CONTRAST (5MM)  Result Date: 08/26/2021 CLINICAL DATA:  Fall EXAM: CT HEAD WITHOUT CONTRAST CT CERVICAL SPINE WITHOUT CONTRAST TECHNIQUE: Multidetector CT imaging of the head and cervical spine was performed following the standard protocol without intravenous contrast. Multiplanar CT image reconstructions of the cervical spine were also generated. RADIATION DOSE REDUCTION: This exam was performed according to the departmental dose-optimization program which includes automated exposure control, adjustment of the mA and/or kV according to patient size and/or use of iterative reconstruction technique. COMPARISON:  None Available. FINDINGS: CT HEAD FINDINGS Brain: There is a para falcine subdural hematoma that measures 4 mm in  thickness. At the anterior left convexity there is a small focus of subarachnoid blood. No midline shift or other mass effect. The size and configuration of the ventricles and extra-axial CSF spaces are normal. There is hypoattenuation of the periventricular white matter, most commonly indicating chronic ischemic microangiopathy. Vascular: No abnormal hyperdensity of the major  intracranial arteries or dural venous sinuses. No intracranial atherosclerosis. Skull: Left parietal scalp hematoma.  No skull fracture. Sinuses/Orbits: No fluid levels or advanced mucosal thickening of the visualized paranasal sinuses. No mastoid or middle ear effusion. The orbits are normal. CT CERVICAL SPINE FINDINGS Alignment: Grade 1 anterolisthesis at C6-7. Skull base and vertebrae: No acute fracture. Soft tissues and spinal canal: No prevertebral fluid or swelling. No visible canal hematoma. Disc levels: No advanced spinal canal or neural foraminal stenosis. Upper chest: No pneumothorax, pulmonary nodule or pleural effusion. Other: Normal visualized paraspinal cervical soft tissues. IMPRESSION: 1. Small parafalcine subdural hematoma without midline shift or other mass effect. 2. Small focus of subarachnoid blood at the anterior left convexity. 3. Left parietal scalp hematoma without skull fracture. 4. No acute fracture or static subluxation of the cervical spine. Critical Value/emergent results were called by telephone at the time of interpretation on 08/26/2021 at 3:12 am to provider [facility] , who verbally acknowledged these results. Electronically Signed   By: Ulyses Jarred M.D.   On: 08/26/2021 03:38    Review of Systems  HENT:  Negative for ear discharge, ear pain, hearing loss and tinnitus.   Eyes:  Negative for photophobia and pain.  Respiratory:  Negative for cough and shortness of breath.   Cardiovascular:  Negative for chest pain.  Gastrointestinal:  Negative for abdominal pain, nausea and vomiting.   Genitourinary:  Negative for dysuria, flank pain, frequency and urgency.  Musculoskeletal:  Positive for arthralgias (Left wrist and hip). Negative for back pain, myalgias and neck pain.  Neurological:  Positive for headaches. Negative for dizziness.  Hematological:  Does not bruise/bleed easily.  Psychiatric/Behavioral:  The patient is not nervous/anxious.    Blood pressure 133/62, pulse 79, temperature 97.9 F (36.6 C), temperature source Oral, resp. rate 15, height '5\' 4"'$  (1.626 m), weight 55.8 kg, SpO2 100 %. Physical Exam Constitutional:      General: Jennifer Porter is not in acute distress.    Appearance: Jennifer Porter is well-developed. Jennifer Porter is not diaphoretic.  HENT:     Head: Normocephalic and atraumatic.  Eyes:     General: No scleral icterus.       Right eye: No discharge.        Left eye: No discharge.     Conjunctiva/sclera: Conjunctivae normal.  Cardiovascular:     Rate and Rhythm: Normal rate and regular rhythm.  Pulmonary:     Effort: Pulmonary effort is normal. No respiratory distress.  Musculoskeletal:     Cervical back: Normal range of motion.     Comments: Left shoulder, elbow, wrist, digits- Superficial abrasion radial wrist, severe TTP with slight deformity, no instability, no blocks to motion  Sens  Ax/R/M/U intact  Mot   Ax/ R/ PIN/ M/ AIN/ U intact  Rad 2+  LLE No traumatic wounds, ecchymosis, or rash  Mod TTP hip and knee  No knee or ankle effusion  Sens DPN, SPN, TN intact  Motor EHL, ext, flex, evers 5/5  DP 1+, PT 0, No significant edema  Skin:    General: Skin is warm and dry.  Neurological:     Mental Status: Jennifer Porter is alert.  Psychiatric:        Mood and Affect: Mood normal.        Behavior: Behavior normal.     Assessment/Plan: Left wrist fx -- Plan ORIF tomorrow with Dr. Doreatha Martin. Left hip fx -- Plan IMN tomorrow.    Lisette Abu, PA-C Orthopedic Surgery [phone] 08/26/2021,  10:46 AM

## 2021-08-26 NOTE — TOC CAGE-AID Note (Signed)
Transition of Care [facility]) - CAGE-AID Screening   Patient Details  Name: TAMBRIA PFANNENSTIEL MRN: [id number] Date of Birth: 1945/11/22  Transition of Care [facility]) CM/SW Contact:    Meggin Ola C Tarpley-Carter, [place] Phone Number: 08/26/2021, 2:51 PM   Clinical Narrative: Pt participated in [place].  Pt stated she does not use substance or ETOH.  Pt was not offered resources, due to no usage of substance or ETOH.     Jayana Kotula Tarpley-Carter, MSW, LCSW-A Pronouns:  She/Her/Hers [facility] Clinical Social Worker Direct Number:  [phone] [email or web address]  CAGE-AID Screening:    Have You Ever Felt You Ought to Cut Down on Your Drinking or Drug Use?: No Have People Annoyed You By [employer] Your Drinking Or Drug Use?: No Have You Felt Bad Or Guilty About Your Drinking Or Drug Use?: No Have You Ever Had a Drink or Used Drugs First Thing In The Morning to Steady Your Nerves or to Get Rid of a Hangover?: No CAGE-AID Score: 0  Substance Abuse Education Offered: No

## 2021-08-26 NOTE — ED Triage Notes (Signed)
Pt BIB [facility] after falling on her porch, c/o left wrist pain, left femur, laceration to left side of head, takes, asa, denies LOC, per EMS v/s WNL, had 50 mcg Fentanyl and '4mg'$  Zofran en route

## 2021-08-27 ENCOUNTER — Encounter (HOSPITAL_COMMUNITY): Admission: EM | Disposition: A | Discharge status: Rehabilitation Facility | Payer: Self-pay | Source: Home / Self Care

## 2021-08-27 ENCOUNTER — Other Ambulatory Visit: Payer: Self-pay

## 2021-08-27 ENCOUNTER — Inpatient Hospital Stay (HOSPITAL_COMMUNITY): Payer: Medicare Other

## 2021-08-27 ENCOUNTER — Inpatient Hospital Stay (HOSPITAL_COMMUNITY): Payer: Medicare Other | Admitting: Certified Registered Nurse Anesthetist

## 2021-08-27 ENCOUNTER — Encounter (HOSPITAL_COMMUNITY): Payer: Self-pay | Admitting: Surgery

## 2021-08-27 DIAGNOSIS — S62102A Fracture of unspecified carpal bone, left wrist, initial encounter for closed fracture: Secondary | ICD-10-CM

## 2021-08-27 DIAGNOSIS — S7292XA Unspecified fracture of left femur, initial encounter for closed fracture: Secondary | ICD-10-CM

## 2021-08-27 HISTORY — PX: INTRAMEDULLARY (IM) NAIL INTERTROCHANTERIC: SHX5875

## 2021-08-27 HISTORY — PX: ORIF WRIST FRACTURE: SHX2133

## 2021-08-27 LAB — HEMOGLOBIN A1C
Hgb A1c MFr Bld: 6.4 % — ABNORMAL HIGH (ref 4.8–5.6)
Mean Plasma Glucose: 136.98 mg/dL

## 2021-08-27 LAB — CBC
HCT: 29.1 % — ABNORMAL LOW (ref 36.0–46.0)
Hemoglobin: 9.7 g/dL — ABNORMAL LOW (ref 12.0–15.0)
MCH: 31.3 pg (ref 26.0–34.0)
MCHC: 33.3 g/dL (ref 30.0–36.0)
MCV: 93.9 fL (ref 80.0–100.0)
Platelets: 181 10*3/uL (ref 150–400)
RBC: 3.1 MIL/uL — ABNORMAL LOW (ref 3.87–5.11)
RDW: 13.2 % (ref 11.5–15.5)
WBC: 6.3 10*3/uL (ref 4.0–10.5)
nRBC: 0 % (ref 0.0–0.2)

## 2021-08-27 LAB — BASIC METABOLIC PANEL
Anion gap: 4 — ABNORMAL LOW (ref 5–15)
BUN: 12 mg/dL (ref 8–23)
CO2: 27 mmol/L (ref 22–32)
Calcium: 8.1 mg/dL — ABNORMAL LOW (ref 8.9–10.3)
Chloride: 108 mmol/L (ref 98–111)
Creatinine, Ser: 0.62 mg/dL (ref 0.44–1.00)
GFR, Estimated: >60 mL/min (ref >60)
Glucose, Bld: 105 mg/dL — ABNORMAL HIGH (ref 70–99)
Potassium: 3.8 mmol/L (ref 3.5–5.1)
Sodium: 139 mmol/L (ref 135–145)

## 2021-08-27 LAB — GLUCOSE, CAPILLARY
Glucose-Capillary: 104 mg/dL — ABNORMAL HIGH (ref 70–99)
Glucose-Capillary: 124 mg/dL — ABNORMAL HIGH (ref 70–99)
Glucose-Capillary: 127 mg/dL — ABNORMAL HIGH (ref 70–99)
Glucose-Capillary: 161 mg/dL — ABNORMAL HIGH (ref 70–99)
Glucose-Capillary: 198 mg/dL — ABNORMAL HIGH (ref 70–99)
Glucose-Capillary: 238 mg/dL — ABNORMAL HIGH (ref 70–99)
Glucose-Capillary: 87 mg/dL (ref 70–99)

## 2021-08-27 LAB — SURGICAL PCR SCREEN
MRSA, PCR: NEGATIVE
Staphylococcus aureus: NEGATIVE

## 2021-08-27 SURGERY — FIXATION, FRACTURE, INTERTROCHANTERIC, WITH INTRAMEDULLARY ROD
Anesthesia: General | Site: Wrist | Laterality: Left

## 2021-08-27 MED ORDER — HYDROMORPHONE HCL 1 MG/ML IJ SOLN
0.2500 mg | INTRAMUSCULAR | Status: DC | PRN
  Administered 2021-08-27: 0.5 mg via INTRAVENOUS

## 2021-08-27 MED ORDER — POLYETHYLENE GLYCOL 3350 17 G PO PACK: 17.0000 g | PACK | Freq: Every day | ORAL | Status: DC | PRN

## 2021-08-27 MED ORDER — PROPOFOL 10 MG/ML IV BOLUS
INTRAVENOUS | Status: AC
  Filled 2021-08-27: qty 40

## 2021-08-27 MED ORDER — LIDOCAINE 2% (20 MG/ML) 5 ML SYRINGE
INTRAMUSCULAR | Status: DC | PRN
  Administered 2021-08-27: 40 mg via INTRAVENOUS

## 2021-08-27 MED ORDER — LACTATED RINGERS IV SOLN: INTRAVENOUS | Status: DC

## 2021-08-27 MED ORDER — MIDAZOLAM HCL 2 MG/2ML IJ SOLN
INTRAMUSCULAR | Status: DC | PRN
  Administered 2021-08-27 (×2): 1 mg via INTRAVENOUS

## 2021-08-27 MED ORDER — POVIDONE-IODINE 10 % EX SWAB
2.0000 "application " | Freq: Once | CUTANEOUS | Status: AC
  Administered 2021-08-27: 2 via TOPICAL

## 2021-08-27 MED ORDER — PHENYLEPHRINE 80 MCG/ML (10ML) SYRINGE FOR IV PUSH (FOR BLOOD PRESSURE SUPPORT)
PREFILLED_SYRINGE | INTRAVENOUS | Status: DC | PRN
  Administered 2021-08-27: 40 ug via INTRAVENOUS
  Administered 2021-08-27: 80 ug via INTRAVENOUS
  Administered 2021-08-27: 40 ug via INTRAVENOUS
  Administered 2021-08-27 (×3): 80 ug via INTRAVENOUS
  Administered 2021-08-27: 40 ug via INTRAVENOUS

## 2021-08-27 MED ORDER — FENTANYL CITRATE (PF) 250 MCG/5ML IJ SOLN
INTRAMUSCULAR | Status: AC
  Filled 2021-08-27: qty 5

## 2021-08-27 MED ORDER — ACETAMINOPHEN 10 MG/ML IV SOLN
1000.0000 mg | Freq: Once | INTRAVENOUS | Status: DC | PRN
  Administered 2021-08-27: 1000 mg via INTRAVENOUS

## 2021-08-27 MED ORDER — DEXAMETHASONE SODIUM PHOSPHATE 10 MG/ML IJ SOLN
INTRAMUSCULAR | Status: DC | PRN
  Administered 2021-08-27: 5 mg via INTRAVENOUS

## 2021-08-27 MED ORDER — ENOXAPARIN SODIUM 40 MG/0.4ML IJ SOSY: 40.0000 mg | PREFILLED_SYRINGE | INTRAMUSCULAR | Status: DC

## 2021-08-27 MED ORDER — DEXAMETHASONE SODIUM PHOSPHATE 10 MG/ML IJ SOLN
INTRAMUSCULAR | Status: AC
Start: 2021-08-27 — End: ?
  Filled 2021-08-27: qty 1

## 2021-08-27 MED ORDER — AMISULPRIDE (ANTIEMETIC) 5 MG/2ML IV SOLN: 10.0000 mg | Freq: Once | INTRAVENOUS | Status: DC | PRN

## 2021-08-27 MED ORDER — ONDANSETRON HCL 4 MG/2ML IJ SOLN
INTRAMUSCULAR | Status: DC | PRN
  Administered 2021-08-27: 4 mg via INTRAVENOUS

## 2021-08-27 MED ORDER — PROPOFOL 10 MG/ML IV BOLUS
INTRAVENOUS | Status: DC | PRN
  Administered 2021-08-27: 80 mg via INTRAVENOUS

## 2021-08-27 MED ORDER — VANCOMYCIN HCL 1000 MG IV SOLR
INTRAVENOUS | Status: DC | PRN
  Administered 2021-08-27: 1000 mg via TOPICAL

## 2021-08-27 MED ORDER — SUGAMMADEX SODIUM 200 MG/2ML IV SOLN
INTRAVENOUS | Status: DC | PRN
  Administered 2021-08-27 (×2): 100 mg via INTRAVENOUS

## 2021-08-27 MED ORDER — ORAL CARE MOUTH RINSE
15.0000 mL | Freq: Once | OROMUCOSAL | Status: AC
Start: 2021-08-27 — End: 2021-08-27

## 2021-08-27 MED ORDER — CHLORHEXIDINE GLUCONATE 0.12 % MT SOLN
15.0000 mL | Freq: Once | OROMUCOSAL | Status: AC
Start: 2021-08-27 — End: 2021-08-27

## 2021-08-27 MED ORDER — INSULIN ASPART 100 UNIT/ML IJ SOLN
0.0000 [IU] | Freq: Three times a day (TID) | INTRAMUSCULAR | Status: DC
  Administered 2021-08-27: 5 [IU] via SUBCUTANEOUS
  Administered 2021-08-27: 3 [IU] via SUBCUTANEOUS
  Administered 2021-08-28 (×2): 2 [IU] via SUBCUTANEOUS
  Administered 2021-08-28 (×2): 5 [IU] via SUBCUTANEOUS
  Administered 2021-08-29: 8 [IU] via SUBCUTANEOUS
  Administered 2021-08-29: 2 [IU] via SUBCUTANEOUS
  Administered 2021-08-29 – 2021-08-30 (×2): 3 [IU] via SUBCUTANEOUS
  Administered 2021-08-30: 2 [IU] via SUBCUTANEOUS
  Administered 2021-08-30: 5 [IU] via SUBCUTANEOUS
  Administered 2021-08-30: 8 [IU] via SUBCUTANEOUS
  Administered 2021-08-31 (×2): 5 [IU] via SUBCUTANEOUS
  Administered 2021-08-31 – 2021-09-01 (×3): 3 [IU] via SUBCUTANEOUS

## 2021-08-27 MED ORDER — ACETAMINOPHEN 10 MG/ML IV SOLN
INTRAVENOUS | Status: AC
  Filled 2021-08-27: qty 100

## 2021-08-27 MED ORDER — HYDROMORPHONE HCL 1 MG/ML IJ SOLN
INTRAMUSCULAR | Status: AC
  Filled 2021-08-27: qty 1

## 2021-08-27 MED ORDER — FENTANYL CITRATE (PF) 100 MCG/2ML IJ SOLN
INTRAMUSCULAR | Status: AC
  Filled 2021-08-27: qty 2

## 2021-08-27 MED ORDER — ACETAMINOPHEN 325 MG PO TABS: 325.0000 mg | ORAL_TABLET | Freq: Once | ORAL | Status: DC | PRN

## 2021-08-27 MED ORDER — CEFAZOLIN SODIUM-DEXTROSE 2-4 GM/100ML-% IV SOLN
2.0000 g | Freq: Three times a day (TID) | INTRAVENOUS | Status: AC
  Administered 2021-08-27 – 2021-08-28 (×3): 2 g via INTRAVENOUS
  Filled 2021-08-27 (×3): qty 100

## 2021-08-27 MED ORDER — ACETAMINOPHEN 160 MG/5ML PO SOLN: 325.0000 mg | Freq: Once | ORAL | Status: DC | PRN

## 2021-08-27 MED ORDER — MEPERIDINE HCL 25 MG/ML IJ SOLN: 6.2500 mg | INTRAMUSCULAR | Status: DC | PRN

## 2021-08-27 MED ORDER — PHENYLEPHRINE 80 MCG/ML (10ML) SYRINGE FOR IV PUSH (FOR BLOOD PRESSURE SUPPORT)
PREFILLED_SYRINGE | INTRAVENOUS | Status: AC
  Filled 2021-08-27: qty 10

## 2021-08-27 MED ORDER — ROCURONIUM BROMIDE 10 MG/ML (PF) SYRINGE
PREFILLED_SYRINGE | INTRAVENOUS | Status: DC | PRN
  Administered 2021-08-27: 80 mg via INTRAVENOUS

## 2021-08-27 MED ORDER — CHLORHEXIDINE GLUCONATE 0.12 % MT SOLN
OROMUCOSAL | Status: AC
  Administered 2021-08-27: 15 mL via OROMUCOSAL
  Filled 2021-08-27: qty 15

## 2021-08-27 MED ORDER — ALBUMIN HUMAN 5 % IV SOLN: INTRAVENOUS | Status: DC | PRN

## 2021-08-27 MED ORDER — 0.9 % SODIUM CHLORIDE (POUR BTL) OPTIME
TOPICAL | Status: DC | PRN
  Administered 2021-08-27: 1000 mL

## 2021-08-27 MED ORDER — EPHEDRINE SULFATE-NACL 50-0.9 MG/10ML-% IV SOSY
PREFILLED_SYRINGE | INTRAVENOUS | Status: DC | PRN
  Administered 2021-08-27 (×4): 5 mg via INTRAVENOUS

## 2021-08-27 MED ORDER — VANCOMYCIN HCL 1000 MG IV SOLR
INTRAVENOUS | Status: AC
  Filled 2021-08-27: qty 20

## 2021-08-27 MED ORDER — EPHEDRINE 5 MG/ML INJ
INTRAVENOUS | Status: AC
  Filled 2021-08-27: qty 5

## 2021-08-27 MED ORDER — MIDAZOLAM HCL 2 MG/2ML IJ SOLN
INTRAMUSCULAR | Status: AC
Start: 2021-08-27 — End: ?
  Filled 2021-08-27: qty 2

## 2021-08-27 MED ORDER — ENOXAPARIN SODIUM 30 MG/0.3ML IJ SOSY
30.0000 mg | PREFILLED_SYRINGE | Freq: Two times a day (BID) | INTRAMUSCULAR | Status: DC
  Administered 2021-08-28 – 2021-09-01 (×9): 30 mg via SUBCUTANEOUS
  Filled 2021-08-27 (×9): qty 0.3

## 2021-08-27 MED ORDER — TRANEXAMIC ACID-NACL 1000-0.7 MG/100ML-% IV SOLN
1000.0000 mg | Freq: Once | INTRAVENOUS | Status: AC
Start: 2021-08-27 — End: 2021-08-27
  Administered 2021-08-27: 1000 mg via INTRAVENOUS
  Filled 2021-08-27 (×2): qty 100

## 2021-08-27 MED ORDER — ONDANSETRON HCL 4 MG/2ML IJ SOLN
INTRAMUSCULAR | Status: AC
Start: 2021-08-27 — End: ?
  Filled 2021-08-27: qty 2

## 2021-08-27 MED ORDER — PHENYLEPHRINE HCL-NACL 20-0.9 MG/250ML-% IV SOLN
INTRAVENOUS | Status: DC | PRN
  Administered 2021-08-27: 50 ug/min via INTRAVENOUS

## 2021-08-27 MED ORDER — LIDOCAINE 2% (20 MG/ML) 5 ML SYRINGE
INTRAMUSCULAR | Status: AC
Start: 2021-08-27 — End: ?
  Filled 2021-08-27: qty 5

## 2021-08-27 MED ORDER — ROCURONIUM BROMIDE 10 MG/ML (PF) SYRINGE
PREFILLED_SYRINGE | INTRAVENOUS | Status: AC
Start: 2021-08-27 — End: ?
  Filled 2021-08-27: qty 10

## 2021-08-27 MED ORDER — CEFAZOLIN SODIUM-DEXTROSE 2-4 GM/100ML-% IV SOLN
2.0000 g | INTRAVENOUS | Status: AC
  Administered 2021-08-27: 2 g via INTRAVENOUS
  Filled 2021-08-27: qty 100

## 2021-08-27 MED ORDER — FENTANYL CITRATE (PF) 250 MCG/5ML IJ SOLN
INTRAMUSCULAR | Status: DC | PRN
Start: 2021-08-27 — End: 2021-08-27
  Administered 2021-08-27 (×3): 50 ug via INTRAVENOUS

## 2021-08-27 SURGICAL SUPPLY — 96 items
ADH SKN CLS APL DERMABOND .7 (GAUZE/BANDAGES/DRESSINGS)
APL PRP STRL LF DISP 70% ISPRP (MISCELLANEOUS) ×4
BAG COUNTER SPONGE SURGICOUNT (BAG) ×3 IMPLANT
BAG SPNG CNTER NS LX DISP (BAG) ×2
BIT DRILL 100X2XQC STRL (BIT) IMPLANT
BIT DRILL CALIBRATED 1.8MM (BIT) IMPLANT
BIT DRILL INTERTAN LAG SCREW (BIT) ×1 IMPLANT
BIT DRILL LONG 4.0 (BIT) IMPLANT
BIT DRILL QC 2.0X100 (BIT) ×3
BIT DRL 100X2XQC STRL (BIT) ×2
BLADE CLIPPER SURG (BLADE) IMPLANT
BNDG CMPR 9X4 STRL LF SNTH (GAUZE/BANDAGES/DRESSINGS)
BNDG ELASTIC 3X5.8 VLCR STR LF (GAUZE/BANDAGES/DRESSINGS) ×3 IMPLANT
BNDG ELASTIC 4X5.8 VLCR STR LF (GAUZE/BANDAGES/DRESSINGS) ×2 IMPLANT
BNDG ESMARK 4X9 LF (GAUZE/BANDAGES/DRESSINGS) ×2 IMPLANT
BNDG GAUZE ELAST 4 BULKY (GAUZE/BANDAGES/DRESSINGS) ×2 IMPLANT
BRUSH SCRUB EZ PLAIN DRY (MISCELLANEOUS) ×6 IMPLANT
CHLORAPREP W/TINT 26 (MISCELLANEOUS) ×4 IMPLANT
CLSR STERI-STRIP ANTIMIC 1/2X4 (GAUZE/BANDAGES/DRESSINGS) ×1 IMPLANT
COVER PERINEAL POST (MISCELLANEOUS) ×3 IMPLANT
COVER SURGICAL LIGHT HANDLE (MISCELLANEOUS) ×3 IMPLANT
CUFF TOURN SGL QUICK 18X4 (TOURNIQUET CUFF) IMPLANT
CUFF TOURN SGL QUICK 24 (TOURNIQUET CUFF)
CUFF TRNQT CYL 24X4X16.5-23 (TOURNIQUET CUFF) IMPLANT
DERMABOND ADVANCED (GAUZE/BANDAGES/DRESSINGS)
DERMABOND ADVANCED .7 DNX12 (GAUZE/BANDAGES/DRESSINGS) ×2 IMPLANT
DISTAL RADIUS VOL 2.4NAR 6/3H (Orthopedic Implant) ×3 IMPLANT
DRAPE C-ARM 35X43 STRL (DRAPES) ×3 IMPLANT
DRAPE IMP U-DRAPE 54X76 (DRAPES) ×6 IMPLANT
DRAPE INCISE IOBAN 66X45 STRL (DRAPES) ×3 IMPLANT
DRAPE STERI IOBAN 125X83 (DRAPES) ×3 IMPLANT
DRAPE SURG 17X23 STRL (DRAPES) ×6 IMPLANT
DRAPE U-SHAPE 47X51 STRL (DRAPES) ×3 IMPLANT
DRESSING MEPILEX FLEX 4X4 (GAUZE/BANDAGES/DRESSINGS) IMPLANT
DRILL BIT LONG 4.0 (BIT) ×3
DRILL CALIBRATED 1.8MM (BIT) ×3
DRSG MEPILEX BORDER 4X4 (GAUZE/BANDAGES/DRESSINGS) ×3 IMPLANT
DRSG MEPILEX BORDER 4X8 (GAUZE/BANDAGES/DRESSINGS) ×3 IMPLANT
DRSG MEPILEX FLEX 4X4 (GAUZE/BANDAGES/DRESSINGS) ×3
ELECT REM PT RETURN 9FT ADLT (ELECTROSURGICAL) ×3
ELECTRODE REM PT RTRN 9FT ADLT (ELECTROSURGICAL) ×2 IMPLANT
GAUZE SPONGE 4X4 12PLY STRL (GAUZE/BANDAGES/DRESSINGS) ×3 IMPLANT
GAUZE XEROFORM 1X8 LF (GAUZE/BANDAGES/DRESSINGS) ×2 IMPLANT
GLOVE BIO SURGEON STRL SZ 6.5 (GLOVE) ×9 IMPLANT
GLOVE BIO SURGEON STRL SZ7.5 (GLOVE) ×12 IMPLANT
GLOVE BIOGEL PI IND STRL 6.5 (GLOVE) ×2 IMPLANT
GLOVE BIOGEL PI IND STRL 7.0 (GLOVE) ×2 IMPLANT
GLOVE BIOGEL PI IND STRL 7.5 (GLOVE) ×2 IMPLANT
GLOVE BIOGEL PI INDICATOR 6.5 (GLOVE) ×1
GLOVE BIOGEL PI INDICATOR 7.0 (GLOVE) ×1
GLOVE BIOGEL PI INDICATOR 7.5 (GLOVE) ×1
GOWN STRL REUS W/ TWL LRG LVL3 (GOWN DISPOSABLE) ×2 IMPLANT
GOWN STRL REUS W/TWL LRG LVL3 (GOWN DISPOSABLE) ×3
GUIDE PIN 3.2X343 (PIN) ×4
GUIDE PIN 3.2X343MM (PIN) ×6
GUIDE ROD 3.0 (MISCELLANEOUS) ×3
K-WIRE 1.25 TRCR POINT 150 (WIRE) ×3
K-WIRE 1.6X150 (WIRE) ×3
KIT BASIN OR (CUSTOM PROCEDURE TRAY) ×3 IMPLANT
KIT TURNOVER KIT B (KITS) ×3 IMPLANT
KWIRE 1.25 TRCR POINT 150 (WIRE) IMPLANT
KWIRE 1.6X150 (WIRE) IMPLANT
MANIFOLD NEPTUNE II (INSTRUMENTS) ×3 IMPLANT
NAIL INTERTAN 10X18 130D 10S (Nail) ×1 IMPLANT
NEEDLE 22X1 1/2 (OR ONLY) (NEEDLE) IMPLANT
NS IRRIG 1000ML POUR BTL (IV SOLUTION) ×3 IMPLANT
PACK GENERAL/GYN (CUSTOM PROCEDURE TRAY) ×3 IMPLANT
PACK ORTHO EXTREMITY (CUSTOM PROCEDURE TRAY) ×3 IMPLANT
PAD ARMBOARD 7.5X6 YLW CONV (MISCELLANEOUS) ×6 IMPLANT
PAD CAST 3X4 CTTN HI CHSV (CAST SUPPLIES) IMPLANT
PAD CAST 4YDX4 CTTN HI CHSV (CAST SUPPLIES) ×2 IMPLANT
PADDING CAST COTTON 3X4 STRL (CAST SUPPLIES) ×3
PADDING CAST COTTON 4X4 STRL (CAST SUPPLIES)
PIN GUIDE 3.2X343MM (PIN) IMPLANT
PLATE VOL RADIUS 2.4NAR 6/3H (Orthopedic Implant) IMPLANT
ROD GUIDE 3.0 (MISCELLANEOUS) IMPLANT
SCREW LAG COMPR KIT 90/85 (Screw) ×1 IMPLANT
SCREW LOCK T8 20X2.4XSTVA (Screw) IMPLANT
SCREW LOCKING 2.4X20 (Screw) ×9 IMPLANT
SCREW LOCKING 2.4X22 (Screw) ×1 IMPLANT
SCREW SELF TAP 12M (Screw) ×3 IMPLANT
SCREW TRIGEN LOW PROF 5.0X35 (Screw) ×1 IMPLANT
SCREW VA LOCKING 2.4X18 (Screw) ×1 IMPLANT
SPIKE FLUID TRANSFER (MISCELLANEOUS) ×2 IMPLANT
SUT MNCRL AB 3-0 PS2 18 (SUTURE) ×3 IMPLANT
SUT MNCRL AB 3-0 PS2 27 (SUTURE) ×2 IMPLANT
SUT VIC AB 0 CT1 27 (SUTURE)
SUT VIC AB 0 CT1 27XBRD ANBCTR (SUTURE) IMPLANT
SUT VIC AB 2-0 CT1 27 (SUTURE) ×6
SUT VIC AB 2-0 CT1 TAPERPNT 27 (SUTURE) ×4 IMPLANT
SYR CONTROL 10ML LL (SYRINGE) ×3 IMPLANT
TOWEL GREEN STERILE (TOWEL DISPOSABLE) ×6 IMPLANT
TOWEL GREEN STERILE FF (TOWEL DISPOSABLE) ×3 IMPLANT
TUBE CONNECTING 12X1/4 (SUCTIONS) ×3 IMPLANT
WATER STERILE IRR 1000ML POUR (IV SOLUTION) ×3 IMPLANT
YANKAUER SUCT BULB TIP NO VENT (SUCTIONS) IMPLANT

## 2021-08-27 NOTE — Anesthesia Preprocedure Evaluation (Signed)
Anesthesia Evaluation  Patient identified by MRN, date of birth, ID band Patient awake    Reviewed: Allergy & Precautions, NPO status , Patient's Chart, lab work & pertinent test results  History of Anesthesia Complications (+) PONV and history of anesthetic complications  Airway Mallampati: I  TM Distance: >3 FB Neck ROM: Full    Dental  (+) Teeth Intact, Dental Advisory Given   Pulmonary asthma ,    breath sounds clear to auscultation       Cardiovascular hypertension, Pt. on medications + CAD   Rhythm:Regular Rate:Normal     Neuro/Psych negative neurological ROS  negative psych ROS   GI/Hepatic Neg liver ROS, GERD  Medicated,  Endo/Other  diabetes, Type 2, Oral Hypoglycemic Agents  Renal/GU Renal disease     Musculoskeletal negative musculoskeletal ROS (+)   Abdominal Normal abdominal exam  (+)   Peds  Hematology negative hematology ROS (+)   Anesthesia Other Findings   Reproductive/Obstetrics                             Anesthesia Physical Anesthesia Plan  ASA: 2  Anesthesia Plan: General   Post-op Pain Management:    Induction: Intravenous  PONV Risk Score and Plan: 4 or greater and Ondansetron, Dexamethasone and Treatment may vary due to age or medical condition  Airway Management Planned: Oral ETT  Additional Equipment: None  Intra-op Plan:   Post-operative Plan: Extubation in OR  Informed Consent: I have reviewed the patients History and Physical, chart, labs and discussed the procedure including the risks, benefits and alternatives for the proposed anesthesia with the patient or authorized representative who has indicated his/her understanding and acceptance.     Dental advisory given  Plan Discussed with: CRNA  Anesthesia Plan Comments: (Lab Results      Component                Value               Date                      WBC                      6.3                  08/27/2021                HGB                      9.7 (L)             08/27/2021                HCT                      29.1 (L)            08/27/2021                MCV                      93.9                08/27/2021                PLT  181                 08/27/2021           )        Anesthesia Quick Evaluation

## 2021-08-27 NOTE — Op Note (Signed)
Orthopaedic Surgery Operative Note (CSN: [id number] ) Date of Surgery: 08/27/2021  Admit Date: 08/26/2021   Diagnoses: Pre-Op Diagnoses: Left intertrochanteric femur fracture Left distal radius fracture  Post-Op Diagnosis: Same  Procedures: CPT 27245-Cephalomedullary nailing of left intertrochanteric femur fracture CPT 25607-Open reduction internal fixation of left distal radius fracture  Surgeons : Primary: Shona Needles, MD  Assistant: Patrecia Pace, PA-C  Location: OR 3   Anesthesia:General   Antibiotics: Ancef 2g preop with 1 gm vancomycin powder placed in wrist incision   Tourniquet time: Total Tourniquet Time Documented: Upper Arm (Left) - 36 minutes Total: Upper Arm (Left) - 36 minutes     Estimated Blood DXAJ:287 mL   Complications:None  Specimens:None   Implants: Implant Name Type Inv. Item Serial No. Manufacturer Lot No. LRB No. Used Action  NAIL INTERTAN 10X18 130D 10S - [id number] Nail NAIL INTERTAN 10X18 130D 10S  [employer] [id number] Left 1 Implanted  SCREW LAG COMPR KIT 90/85 - [id number] Screw SCREW LAG COMPR KIT 90/85  [facility] AND [employer] [id number] Left 1 Implanted  SCREW TRIGEN LOW PROF 5.0X35 - [id number] Screw SCREW TRIGEN LOW PROF 5.0X35  [employer] [id number] Left 1 Implanted  DISTAL RADIUS VOL 2.4NAR 6/3H - [id number] Orthopedic Implant DISTAL RADIUS VOL 2.4NAR 6/3H  [employer]  Left 1 Implanted  SCREW VA LOCKING 2.4X18 - [id number] Screw SCREW VA LOCKING 2.4X18  [employer]  Left 1 Implanted  SCREW LOCKING 2.4X20 - [id number] Screw SCREW LOCKING 2.4X20  [employer]  Left 3 Implanted  SCREW LOCKING 2.4X22 - [id number] Screw SCREW LOCKING 2.4X22  [employer] OR[employer]  Left 1 Implanted  SCREW SELF TAP 60M - [id number] Screw SCREW SELF TAP 60M  [employer]  Left 3 Implanted     Indications for Surgery: 76 year old female who sustained a ground-level fall with a left intertrochanteric  femur fracture and a left distal radius fracture.  Due to the unstable nature of her injuries I recommended proceeding with cephalomedullary nailing of her left hip along with open reduction internal fixation of her left distal radius.  Risks and benefits were discussed with the patient.  Risks included but not limited to bleeding, infection, malunion, nonunion, hardware failure, hardware irritation, nerve and blood vessel injury, DVT, even the possibility anesthetic complications.  Patient agreed to proceed with surgery and consent was obtained.  Operative Findings: 1.  Cephalomedullary nailing of left intertrochanteric femur fracture using [employer] InterTAN 10 x 180 mm nail with a 90 mm lag screw and compression screw 2.  Open reduction internal fixation of a left extra-articular distal radius fracture using Synthes volar distal radial locking plate  Procedure: The patient was identified in the preoperative holding area. Consent was confirmed with the patient and their family and all questions were answered. The operative extremity was marked after confirmation with the patient. she was then brought back to the operating room by our anesthesia colleagues.  She was placed under general anesthetic and carefully transferred over to the [facility] table.  All bony prominences were well-padded.  The left lower extremity was prepped and draped in usual sterile fashion.  A timeout was performed to verify the patient, the procedure, and the extremity.  Preoperative antibiotics were dosed.  Fluoroscopic imaging was obtained to show adequate alignment of the fracture after traction was applied.  A small incision was made proximal to the greater trochanteric.  A threaded guidewire was directed into the center of the canal into the proximal metaphysis.  I then used an entry reamer to enter the medullary canal.  I then passed a 10 x 180 mm nail down the center of the canal but it was a tight canal and I was not able  to seated deep enough to get the threaded guidewire for the head/neck segment into the appropriate position.  As result I removed the rod and placed a guidewire and then proceeded to ream up to 11.5 mm.  I then passed the nail down the center of the canal once again and I was able to direct the threaded guidewire into the appropriate position using AP and lateral fluoroscopic imaging confirming adequate tip apex distance.  I then measured the length and chose to use a 95 mm lag screw.  The path was drilled for the compression screw and an antirotation bar was placed.  I then drilled and placed the lag screw.  The compression screw was then placed and approximately 5 mm compression was obtained.  The nail was statically locked proximally.  Using the targeting arm I then placed a distal interlocking screw.  The targeting arm was removed.  Final fluoroscopic imaging was obtained.  The incisions were copiously irrigated.  A layered closure of 2-0 Vicryl and 3-0 Monocryl with Dermabond was used to close the skin.  Sterile dressings were applied.  The patient was then positioned for the left upper extremity.  A nonsterile tourniquet was placed in the upper arm.  Arm was positioned on a hand table.  The left upper extremity was prepped and draped in usual sterile fashion.  A timeout was performed to verify the patient, the procedure, and the extremity.  Fluoroscopic imaging was obtained and showed the unstable nature of her injury.  A FCR approach to the distal radius was carried down through skin and subcutaneous tissue.  I incised through the volar fascia of the FCR tendon sheath.  I then mobilized the FCR tendon and incised through the dorsal sheath.  I then swept the finger flexors out of the way and visualized the pronator quadratus.  I released this off the radial border of the radius and exposed the fracture.  A reduction maneuver was performed and a 1.6 mm K wire was placed from the radial styloid across the  fracture into the proximal metaphysis.  I then chose a Synthes 2.4 mm VA volar distal radius plate and placed this and held it provisionally with a K wire.  I then placed a 2.7 millimeter screw into the shaft of the radius.  I then proceeded to place a 2.4 mm variable angle locking screws into the distal articular block.  I confirmed that they were all extra-articular.  I then placed 2 more nonlocking screws into the radial shaft.  The K wires were removed.  Final fluoroscopic imaging was obtained.  The incision was copiously irrigated.  A gram of vancomycin powder was placed into the incision.  A layered closure of 2-0 Vicryl 3-0 Monocryl and Steri-Strips were used to close the skin.  Sterile dressing with a resting volar plaster splint was applied.  The patient was then awoken from anesthesia and taken to the back in stable condition.  Post Op Plan/Instructions: Patient be weightbearing as tolerated to the left lower extremity.  She will be weightbearing as tolerated through the elbow to the left upper extremity nonweightbearing through the wrist.  We will mobilize her with physical and Occupational Therapy.  She will receive Ancef for postoperative prophylaxis and be started on Lovenox for  DVT prophylaxis while inpatient and discharged on a DOAC.  I was present and performed the entire surgery.  Patrecia Pace, PA-C did assist me throughout the case. An assistant was necessary given the difficulty in approach, maintenance of reduction and ability to instrument the fracture.   Katha Hamming, MD [facility]

## 2021-08-27 NOTE — Progress Notes (Signed)
Orthopedic Tech Progress Note Patient Details:  Jennifer Porter [birth year] [id number]  Patient ID: Jennifer Porter, female   DOB: 1945/09/01, 76 y.o.   MRN: [id number] No OHF; upper extremity injury. [place] 08/27/2021, 1:22 PM

## 2021-08-27 NOTE — Anesthesia Procedure Notes (Signed)
Procedure Name: Intubation Date/Time: 08/27/2021 9:09 AM  Performed by: Michele Rockers, CRNAPre-anesthesia Checklist: Patient identified, Patient being monitored, Timeout performed, Emergency Drugs available and Suction available Patient Re-evaluated:Patient Re-evaluated prior to induction Oxygen Delivery Method: Circle System Utilized Preoxygenation: Pre-oxygenation with 100% oxygen Induction Type: IV induction Ventilation: Mask ventilation without difficulty Laryngoscope Size: Miller and 2 Grade View: Grade I Tube type: Oral Tube size: 7.0 mm Number of attempts: 1 Airway Equipment and Method: Stylet Placement Confirmation: ETT inserted through vocal cords under direct vision, positive ETCO2 and breath sounds checked- equal and bilateral Secured at: 21 cm Tube secured with: Tape Dental Injury: Teeth and Oropharynx as per pre-operative assessment

## 2021-08-27 NOTE — Transfer of Care (Signed)
Immediate Anesthesia Transfer of Care Note  Patient: Jennifer Porter  Procedure(s) Performed: INTRAMEDULLARY (IM) NAIL INTERTROCHANTRIC (Left) OPEN REDUCTION INTERNAL FIXATION (ORIF) WRIST FRACTURE (Left: Wrist)  Patient Location: PACU  Anesthesia Type:General  Level of Consciousness: drowsy and patient cooperative  Airway & Oxygen Therapy: Patient Spontanous Breathing and Patient connected to nasal cannula oxygen  Post-op Assessment: Report given to RN and Post -op Vital signs reviewed and stable  Post vital signs: Reviewed and stable  Last Vitals:  Vitals Value Taken Time  BP 138/70 08/27/21 1128  Temp    Pulse 113 08/27/21 1132  Resp 18 08/27/21 1132  SpO2 100 % 08/27/21 1132  Vitals shown include unvalidated device data.  Last Pain:  Vitals:   08/27/21 0818  TempSrc:   PainSc: 3          Complications: No notable events documented.

## 2021-08-27 NOTE — Progress Notes (Signed)
PT Cancellation Note  Patient Details Name: [name] MRN: [id number] DOB: 1946/03/10   Cancelled Treatment:    Reason Eval/Treat Not Completed: Patient not medically ready (await surgical fixation prior to therapy initiation)   Sabrin Dunlevy B Kellsey Sansone 08/27/2021, 8:01 AM [place] Office: [phone]

## 2021-08-27 NOTE — Progress Notes (Signed)
SLP Cancellation Note  Patient Details Name: MILDA LINDVALL MRN: [id number] DOB: [birth year]   Cancelled treatment:       Reason Eval/Treat Not Completed: Patient at procedure or test/unavailable (Pt off unit at this time. SLP will follow up later as schedule allows.)  Andric Kerce I. Hardin Negus, [place], [place] Office number [phone] Pager [place] 08/27/2021, 12:20 PM

## 2021-08-27 NOTE — Progress Notes (Signed)
Patient ID: Jennifer Porter, female   DOB: 1945-09-20, 76 y.o.   MRN: [id number] [facility] Surgery Progress Note  Day of Surgery  Subjective: CC-  Tired after surgery this morning. Some pain in the left wrist, otherwise pain well controlled. Mild headache. Denies abdominal pain, n/v. Has not had anything to eat since surgery.  Objective: Vital signs in last 24 hours: Temp:  [97.4 F (36.3 C)-98.4 F (36.9 C)] 97.5 F (36.4 C) (06/09 1300) Pulse Rate:  [65-115] 94 (06/09 1300) Resp:  [14-18] 16 (06/09 1300) BP: (113-148)/(51-80) 138/65 (06/09 1300) SpO2:  [92 %-100 %] 96 % (06/09 1300) Weight:  [55.7 kg] 55.7 kg (06/09 0811) Last BM Date : 08/26/21  Intake/Output from previous day: 06/08 0701 - 06/09 0700 In: 1252.9 [P.O.:360; I.V.:792.9; IV Piggyback:100] Out: -  Intake/Output this shift: Total I/O In: 1050 [I.V.:800; IV Piggyback:250] Out: 100 [Blood:100]  PE: Gen:  Alert, NAD, pleasant HEENT: EOM's intact, pupils equal and round and reactive to light Card:  RRR, palpable pedal pulses Pulm:  CTAB, no W/R/R, rate and effort normal Abd: Soft, NT/ND Ext:  calves soft and nontender. Splint to LUE. Extremities well perfused Psych: A&Ox4 Skin: no rashes noted, warm and dry  Lab Results:  Recent Labs    08/26/21 0404 08/27/21 0719  WBC 11.6* 6.3  HGB 10.9* 9.7*  HCT 33.3* 29.1*  PLT 221 181   BMET Recent Labs    08/26/21 0404 08/27/21 0719  NA 137 139  K 4.1 3.8  CL 110 108  CO2 25 27  GLUCOSE 178* 105*  BUN 24* 12  CREATININE 0.66 0.62  CALCIUM 8.6* 8.1*   PT/INR Recent Labs    08/26/21 0404  LABPROT 13.5  INR 1.0   CMP     Component Value Date/Time   NA 139 08/27/2021 0719   K 3.8 08/27/2021 0719   CL 108 08/27/2021 0719   CO2 27 08/27/2021 0719   GLUCOSE 105 (H) 08/27/2021 0719   BUN 12 08/27/2021 0719   CREATININE 0.62 08/27/2021 0719   CREATININE 0.70 03/29/2021 0906   CALCIUM 8.1 (L) 08/27/2021 0719   PROT 6.9 11/24/2020 0809    ALBUMIN 4.4 08/24/2018 1138   AST 20 11/24/2020 0809   ALT 15 11/24/2020 0809   ALKPHOS 17 (L) 08/24/2018 1138   BILITOT 0.5 11/24/2020 0809   GFRNONAA >60 08/27/2021 0719   GFRNONAA 88 07/20/2020 1042   GFRAA 102 07/20/2020 1042   Lipase     Component Value Date/Time   LIPASE 72 (H) 08/24/2018 1138       Studies/Results: DG HIP PORT UNILAT W OR W/O PELVIS 1V LEFT  Result Date: 08/27/2021 CLINICAL DATA:  Internal fixation of intertrochanteric fracture of left femur EXAM: DG HIP (WITH OR WITHOUT PELVIS) 1V PORT LEFT COMPARISON:  08/26/2021 FINDINGS: There is interval internal fixation of comminuted intertrochanteric fracture of proximal left femur. There are pockets of air in the soft tissues. There is medial displacement of lesser trochanter which has not changed. IMPRESSION: Interval internal fixation of intertrochanteric fracture of left femur. Electronically Signed   By: Elmer Picker M.D.   On: 08/27/2021 11:58   DG Wrist Complete Left  Result Date: 08/27/2021 CLINICAL DATA:  Left wrist ORIF. EXAM: LEFT WRIST - COMPLETE 3+ VIEW COMPARISON:  Left wrist x-rays from yesterday. FLUOROSCOPY TIME:  Radiation Exposure Index (as provided by the fluoroscopic device): 11.69 mGy Kerma C-arm fluoroscopic images were obtained intraoperatively and submitted for post operative interpretation. FINDINGS: Multiple  intraoperative fluoroscopic images demonstrate interval volar plate and screw fixation of the distal radius fracture, now in anatomic alignment. IMPRESSION: 1. Intraoperative fluoroscopic guidance for distal radius fracture ORIF. Electronically Signed   By: Titus Dubin M.D.   On: 08/27/2021 11:29   DG FEMUR MIN 2 VIEWS LEFT  Result Date: 08/27/2021 CLINICAL DATA:  Left femoral neck fracture. EXAM: LEFT FEMUR 2 VIEWS COMPARISON:  08/26/2021 FINDINGS: 4 intraoperative spot fluoro films obtained during ORIF for intertrochanteric left femoral neck fracture. No evidence for immediate  hardware complication. IMPRESSION: Intraoperative assessment during ORIF for intertrochanteric left femoral neck fracture. Electronically Signed   By: Misty Stanley M.D.   On: 08/27/2021 11:29   DG C-Arm 1-60 Min-No Report  Result Date: 08/27/2021 Fluoroscopy was utilized by the requesting physician.  No radiographic interpretation.   DG C-Arm 1-60 Min-No Report  Result Date: 08/27/2021 Fluoroscopy was utilized by the requesting physician.  No radiographic interpretation.   DG Hip Port Unilat W or [place] Pelvis 1 View Left  Result Date: 08/26/2021 CLINICAL DATA:  Recent fall with left hip pain, initial encounter EXAM: DG HIP (WITH OR WITHOUT PELVIS) 3V PORT LEFT COMPARISON:  None Available. FINDINGS: Pelvic ring is intact. Comminuted left intratrochanteric fracture is noted. No dislocation is seen. No other fractures are noted. IMPRESSION: Left intratrochanteric fracture. Electronically Signed   By: Inez Catalina M.D.   On: 08/26/2021 04:04   DG Femur Min 2 Views Left  Result Date: 08/26/2021 CLINICAL DATA:  Recent fall with left leg pain, initial encounter EXAM: LEFT FEMUR 2 VIEWS COMPARISON:  None Available. FINDINGS: Degenerative changes are noted about the left knee joint. Intratrochanteric fracture is noted of the left hip without significant displacement. No dislocation is noted. No other focal abnormality is seen. IMPRESSION: Intratrochanteric fracture of the left femur. Electronically Signed   By: Inez Catalina M.D.   On: 08/26/2021 04:03   DG Wrist Complete Left  Result Date: 08/26/2021 CLINICAL DATA:  Recent fall with left wrist pain, initial encounter EXAM: LEFT WRIST - COMPLETE 3+ VIEW COMPARISON:  None Available. FINDINGS: Comminuted distal radial fracture is noted with intra-articular extension. Impaction is noted at the fracture site with associated soft tissue swelling. Comminuted ulnar styloid fracture is noted as well. Degenerative changes at the first [facility] joint are noted. IMPRESSION:  Distal left radial and ulnar fractures as described. Electronically Signed   By: Inez Catalina M.D.   On: 08/26/2021 04:02   DG Chest 1 View  Result Date: 08/26/2021 CLINICAL DATA:  Recent fall with chest pain, initial encounter EXAM: CHEST  1 VIEW COMPARISON:  None Available. FINDINGS: Cardiac shadow is within normal limits. Aortic calcifications are seen. Lungs are clear. No acute bony abnormality is noted. IMPRESSION: No active disease. Electronically Signed   By: Inez Catalina M.D.   On: 08/26/2021 04:01   CT Cervical Spine Wo Contrast  Result Date: 08/26/2021 CLINICAL DATA:  Fall EXAM: CT HEAD WITHOUT CONTRAST CT CERVICAL SPINE WITHOUT CONTRAST TECHNIQUE: Multidetector CT imaging of the head and cervical spine was performed following the standard protocol without intravenous contrast. Multiplanar CT image reconstructions of the cervical spine were also generated. RADIATION DOSE REDUCTION: This exam was performed according to the departmental dose-optimization program which includes automated exposure control, adjustment of the mA and/or kV according to patient size and/or use of iterative reconstruction technique. COMPARISON:  None Available. FINDINGS: CT HEAD FINDINGS Brain: There is a para falcine subdural hematoma that measures 4 mm in thickness. At the  anterior left convexity there is a small focus of subarachnoid blood. No midline shift or other mass effect. The size and configuration of the ventricles and extra-axial CSF spaces are normal. There is hypoattenuation of the periventricular white matter, most commonly indicating chronic ischemic microangiopathy. Vascular: No abnormal hyperdensity of the major intracranial arteries or dural venous sinuses. No intracranial atherosclerosis. Skull: Left parietal scalp hematoma.  No skull fracture. Sinuses/Orbits: No fluid levels or advanced mucosal thickening of the visualized paranasal sinuses. No mastoid or middle ear effusion. The orbits are normal. CT  CERVICAL SPINE FINDINGS Alignment: Grade 1 anterolisthesis at C6-7. Skull base and vertebrae: No acute fracture. Soft tissues and spinal canal: No prevertebral fluid or swelling. No visible canal hematoma. Disc levels: No advanced spinal canal or neural foraminal stenosis. Upper chest: No pneumothorax, pulmonary nodule or pleural effusion. Other: Normal visualized paraspinal cervical soft tissues. IMPRESSION: 1. Small parafalcine subdural hematoma without midline shift or other mass effect. 2. Small focus of subarachnoid blood at the anterior left convexity. 3. Left parietal scalp hematoma without skull fracture. 4. No acute fracture or static subluxation of the cervical spine. Critical Value/emergent results were called by telephone at the time of interpretation on 08/26/2021 at 3:12 am to provider [facility] , who verbally acknowledged these results. Electronically Signed   By: Ulyses Jarred M.D.   On: 08/26/2021 03:38   CT HEAD WO CONTRAST (5MM)  Result Date: 08/26/2021 CLINICAL DATA:  Fall EXAM: CT HEAD WITHOUT CONTRAST CT CERVICAL SPINE WITHOUT CONTRAST TECHNIQUE: Multidetector CT imaging of the head and cervical spine was performed following the standard protocol without intravenous contrast. Multiplanar CT image reconstructions of the cervical spine were also generated. RADIATION DOSE REDUCTION: This exam was performed according to the departmental dose-optimization program which includes automated exposure control, adjustment of the mA and/or kV according to patient size and/or use of iterative reconstruction technique. COMPARISON:  None Available. FINDINGS: CT HEAD FINDINGS Brain: There is a para falcine subdural hematoma that measures 4 mm in thickness. At the anterior left convexity there is a small focus of subarachnoid blood. No midline shift or other mass effect. The size and configuration of the ventricles and extra-axial CSF spaces are normal. There is hypoattenuation of the periventricular  white matter, most commonly indicating chronic ischemic microangiopathy. Vascular: No abnormal hyperdensity of the major intracranial arteries or dural venous sinuses. No intracranial atherosclerosis. Skull: Left parietal scalp hematoma.  No skull fracture. Sinuses/Orbits: No fluid levels or advanced mucosal thickening of the visualized paranasal sinuses. No mastoid or middle ear effusion. The orbits are normal. CT CERVICAL SPINE FINDINGS Alignment: Grade 1 anterolisthesis at C6-7. Skull base and vertebrae: No acute fracture. Soft tissues and spinal canal: No prevertebral fluid or swelling. No visible canal hematoma. Disc levels: No advanced spinal canal or neural foraminal stenosis. Upper chest: No pneumothorax, pulmonary nodule or pleural effusion. Other: Normal visualized paraspinal cervical soft tissues. IMPRESSION: 1. Small parafalcine subdural hematoma without midline shift or other mass effect. 2. Small focus of subarachnoid blood at the anterior left convexity. 3. Left parietal scalp hematoma without skull fracture. 4. No acute fracture or static subluxation of the cervical spine. Critical Value/emergent results were called by telephone at the time of interpretation on 08/26/2021 at 3:12 am to provider [facility] , who verbally acknowledged these results. Electronically Signed   By: Ulyses Jarred M.D.   On: 08/26/2021 03:38    Anti-infectives: Anti-infectives (From admission, onward)    Start     Dose/Rate  Route Frequency Ordered Stop   08/27/21 1700  ceFAZolin (ANCEF) IVPB 2g/100 mL premix        2 g 200 mL/hr over 30 Minutes Intravenous Every 8 hours 08/27/21 1305 08/28/21 1659   08/27/21 1053  vancomycin (VANCOCIN) powder  Status:  Discontinued          As needed 08/27/21 1053 08/27/21 1122   08/27/21 0815  ceFAZolin (ANCEF) IVPB 2g/100 mL premix        2 g 200 mL/hr over 30 Minutes Intravenous On call to O.R. 08/27/21 0808 08/27/21 0905        Assessment/Plan Fall SAH/SDH -  tiny on initial CT. Per NSGY, no need to repeat CT unless change in neuro exam. Keppra x7 days Left wrist fx - s/p ORIF 6/9 Dr. Doreatha Martin. WBAT thru left elbow and NWB thru the wrist Left intratrochanteric femur fx - s/p IMN 6/9 Dr. Doreatha Martin. WBAT LLE ABL anemia - Hgb 9.7 from 10.9, repeat CBC in AM HTN - home lisinopril HLD GERD - protonix Chronic pancreatitis - home creon DM - SSI   ID - ancef periop  VTE - SCDs, start LMWH 6/10. Plan to dc on DOAC per ortho FEN - dc IVF when taking PO, CM diet Foley - none   Dispo - PT/OT/SLLP.   I reviewed Consultant ortho notes, last 24 h vitals and pain scores, last 48 h intake and output, last 24 h labs and trends, and last 24 h imaging results.    LOS: 1 day    Wellington Hampshire, [facility] Surgery 08/27/2021, 2:50 PM Please see Amion for pager number during day hours 7:00am-4:30pm

## 2021-08-27 NOTE — Interval H&P Note (Signed)
History and Physical Interval Note:  08/27/2021 8:17 AM  Jennifer Porter  has presented today for surgery, with the diagnosis of closed fracture of left wrist and fracture of left femur.  The various methods of treatment have been discussed with the patient and family. After consideration of risks, benefits and other options for treatment, the patient has consented to  Procedure(s): INTRAMEDULLARY (IM) NAIL INTERTROCHANTRIC (Left) OPEN REDUCTION INTERNAL FIXATION (ORIF) WRIST FRACTURE (Left) as a surgical intervention.  The patient's history has been reviewed, patient examined, no change in status, stable for surgery.  I have reviewed the patient's chart and labs.  Questions were answered to the patient's satisfaction.     Lennette Bihari P Skylor Hughson

## 2021-08-28 LAB — BASIC METABOLIC PANEL
Anion gap: 6 (ref 5–15)
BUN: 13 mg/dL (ref 8–23)
CO2: 23 mmol/L (ref 22–32)
Calcium: 7.9 mg/dL — ABNORMAL LOW (ref 8.9–10.3)
Chloride: 107 mmol/L (ref 98–111)
Creatinine, Ser: 0.8 mg/dL (ref 0.44–1.00)
GFR, Estimated: >60 mL/min (ref >60)
Glucose, Bld: 177 mg/dL — ABNORMAL HIGH (ref 70–99)
Potassium: 3.7 mmol/L (ref 3.5–5.1)
Sodium: 136 mmol/L (ref 135–145)

## 2021-08-28 LAB — CBC
HCT: 26.5 % — ABNORMAL LOW (ref 36.0–46.0)
Hemoglobin: 8.8 g/dL — ABNORMAL LOW (ref 12.0–15.0)
MCH: 31.1 pg (ref 26.0–34.0)
MCHC: 33.2 g/dL (ref 30.0–36.0)
MCV: 93.6 fL (ref 80.0–100.0)
Platelets: 186 10*3/uL (ref 150–400)
RBC: 2.83 MIL/uL — ABNORMAL LOW (ref 3.87–5.11)
RDW: 13.1 % (ref 11.5–15.5)
WBC: 9.1 10*3/uL (ref 4.0–10.5)
nRBC: 0 % (ref 0.0–0.2)

## 2021-08-28 LAB — GLUCOSE, CAPILLARY
Glucose-Capillary: 123 mg/dL — ABNORMAL HIGH (ref 70–99)
Glucose-Capillary: 221 mg/dL — ABNORMAL HIGH (ref 70–99)
Glucose-Capillary: 202 mg/dL — ABNORMAL HIGH (ref 70–99)
Glucose-Capillary: 148 mg/dL — ABNORMAL HIGH (ref 70–99)

## 2021-08-28 MED ORDER — PANCRELIPASE (LIP-PROT-AMYL) 36000-114000 UNITS PO CPEP
72000.0000 [IU] | ORAL_CAPSULE | Freq: Three times a day (TID) | ORAL | Status: DC
Start: 2021-08-28 — End: 2021-09-01
  Administered 2021-08-28 – 2021-09-01 (×14): 72000 [IU] via ORAL
  Filled 2021-08-28 (×14): qty 2

## 2021-08-28 NOTE — Evaluation (Signed)
Occupational Therapy Evaluation Patient Details Name: Jennifer Porter MRN: [id number] DOB: Jul 26, 1945 Today's Date: 08/28/2021   History of Present Illness Pt is a 76 y/o female who presented s/p mechanical fall after an altercation involving her dog and a possum outside. Imaging showed L distal radius fx and L femoral neck fx. On 6/9, pt underwent IM nailing of L LE and ORIF of L wrist. PMH: DM, GERD, gout, HLD, HTN, pancreatitis   Clinical Impression   PTA, pt lives with spouse, typically Independent in all daily tasks without need for AD. Pt presents now with deficits in strength, coordination, balance, pain and endurance. Trialed sit to stands with L platform walker though pt unable to successfully achieve/maintain with +1 assist today. Pt currently requiring Mod A for UB ADL and Max A x 2 for LB ADLs due to deficits. Encouraged elevation of L UE, AROM of hand, elbow and shoulder outside of sling. Currently, family unable to provide the physical assist needed. Pt would benefit from AIR level therapies to maximize safety/independence with daily tasks as pt significantly below functional baseline. Pt's daughter present and in agreement. Plan to progress BSC transfers with L platform walker and +2 assist in next session.      Recommendations for follow up therapy are one component of a multi-disciplinary discharge planning process, led by the attending physician.  Recommendations may be updated based on patient status, additional functional criteria and insurance authorization.   Follow Up Recommendations  Acute inpatient rehab (3hours/day)    Assistance Recommended at Discharge Frequent or constant Supervision/Assistance  Patient can return home with the following Two people to help with walking and/or transfers;Two people to help with bathing/dressing/bathroom    Functional Status Assessment  Patient has had a recent decline in their functional status and demonstrates the ability to make  significant improvements in function in a reasonable and predictable amount of time.  Equipment Recommendations  BSC/3in1;Other (comment) (L platform walker; TBD pending progress)    Recommendations for Other Services Rehab consult     Precautions / Restrictions Precautions Precautions: Fall;Other (comment) Precaution Comments: L UE sling present on eval though no orders present Required Braces or Orthoses: Sling Restrictions Weight Bearing Restrictions: Yes LUE Weight Bearing: Weight bear through elbow only LLE Weight Bearing: Weight bearing as tolerated      Mobility Bed Mobility               General bed mobility comments: up in chair on entry    Transfers Overall transfer level: Needs assistance Equipment used: Left platform walker Transfers: Sit to/from Stand Sit to Stand: Max assist           General transfer comment: Trialed L platform walker with pt unable to fully stand with +1 assist. second trial, pt acheived 80% of full posture but due to pain could not sustain      Balance Overall balance assessment: Needs assistance Sitting-balance support: Single extremity supported, Feet supported Sitting balance-Leahy Scale: Poor     Standing balance support: Single extremity supported, During functional activity Standing balance-Leahy Scale: Zero                             ADL either performed or assessed with clinical judgement   ADL Overall ADL's : Needs assistance/impaired Eating/Feeding: Set up;Sitting   Grooming: Minimal assistance;Sitting   Upper Body Bathing: Moderate assistance;Sitting   Lower Body Bathing: Maximal assistance;+2 for physical assistance;+2 for safety/equipment;Sitting/lateral leans;Sit  to/from stand   Upper Body Dressing : Moderate assistance;Sitting   Lower Body Dressing: Maximal assistance;+2 for physical assistance;+2 for safety/equipment;Sit to/from stand;Sitting/lateral leans       Toileting- Clothing  Manipulation and Hygiene: Total assistance;+2 for physical assistance;+2 for safety/equipment;Sitting/lateral lean;Sit to/from stand         General ADL Comments: Limited by post op pain, limited use of L UE and significant weakness. Trialed L platform walker with pt unable to successfully stand with +1 assist     Vision Baseline Vision/History: 1 Wears glasses Ability to See in Adequate Light: 0 Adequate Patient Visual Report: No change from baseline Vision Assessment?: No apparent visual deficits     Perception     Praxis      Pertinent Vitals/Pain Pain Assessment Pain Assessment: Faces Faces Pain Scale: Hurts whole lot Pain Location: L hip with movement Pain Descriptors / Indicators: Discomfort, Guarding, Grimacing Pain Intervention(s): Monitored during session, Limited activity within patient's tolerance     Hand Dominance Right   Extremity/Trunk Assessment Upper Extremity Assessment Upper Extremity Assessment: LUE deficits/detail LUE Deficits / Details: L UE casted mid hand to mid forearm. digits very swollen but pt able to minimially form fist. also noted in sling (no order present). encouraged removal of sling for hand/elbow and shoulder ROM to avoid stiffness (elbow/shoulder ROM WFL) LUE Coordination: decreased fine motor   Lower Extremity Assessment Lower Extremity Assessment: Defer to PT evaluation LLE: Unable to fully assess due to pain   Cervical / Trunk Assessment Cervical / Trunk Assessment: Normal   Communication Communication Communication: No difficulties   Cognition Arousal/Alertness: Awake/alert Behavior During Therapy: WFL for tasks assessed/performed Overall Cognitive Status: Impaired/Different from baseline Area of Impairment: Problem solving, Awareness, Safety/judgement                         Safety/Judgement: Decreased awareness of deficits, Decreased awareness of safety Awareness: Intellectual Problem Solving: Slow processing,  Decreased initiation, Difficulty sequencing, Requires verbal cues, Requires tactile cues General Comments: some slower processing, benefits from cues for seqeuncing and problem solving. likely some impact in cognition d/t pain meds     General Comments  Daughter, [name] present    Exercises Exercises: Other exercises Other Exercises Other Exercises: L hand pumps x 5 Other Exercises: L elbow/shoulder AROM   Shoulder Instructions      Home Living Family/patient expects to be discharged to:: Private residence Living Arrangements: Spouse/significant other Available Help at Discharge: Family;Available PRN/intermittently (husband can only provide limited assist; daughter works) Type of Home: House Home Access: Ramped entrance     [place]: One [place]: Conservation officer, nature (2 wheels);BSC/3in1;Wheelchair - manual (access to [facility] and w/c)          Prior Functioning/Environment Prior Level of Function : Independent/Modified Independent;Driving             Mobility Comments: no  use of AD ADLs Comments: Independent with ADLs, IADLs, driving and grocery shopping. enjoys reading        OT Problem List: Decreased strength;Decreased activity tolerance;Impaired balance (sitting and/or standing);Decreased coordination;Decreased cognition;Decreased safety awareness;Decreased knowledge of use of DME or AE;Decreased knowledge of precautions;Pain;Impaired UE functional use;Increased edema      OT Treatment/Interventions: Self-care/ADL training;Therapeutic exercise;Energy conservation;DME and/or AE instruction;Therapeutic activities;Patient/family education;Balance training    OT Goals(Current goals can be found in the care plan section) Acute Rehab OT Goals Patient  Stated Goal: daughter feels pt needs to go to rehab though pt hesitant OT Goal Formulation: With patient/family Time For Goal Achievement: 09/11/21 Potential to Achieve Goals: Good  OT  Frequency: Min 2X/week    Co-evaluation              AM-PAC OT "6 Clicks" Daily Activity     Outcome Measure Help from another person eating meals?: A Little Help from another person taking care of personal grooming?: A Little Help from another person toileting, which includes using toliet, bedpan, or urinal?: Total Help from another person bathing (including washing, rinsing, drying)?: A Lot Help from another person to put on and taking off regular upper body clothing?: A Lot Help from another person to put on and taking off regular lower body clothing?: A Lot 6 Click Score: 13   End of Session Equipment Utilized During Treatment: Gait belt;Other (comment) (L platform walker) Nurse Communication: Other (comment) (IV bleeding)  Activity Tolerance: Patient limited by pain Patient left: in chair;with call bell/phone within reach;with chair alarm set;with family/visitor present  OT Visit Diagnosis: Unsteadiness on feet (R26.81);Other abnormalities of gait and mobility (R26.89);Muscle weakness (generalized) (M62.81);Pain Pain - Right/Left: Left Pain - part of body: Leg;Arm                Time: 0938-1829 OT Time Calculation (min): 34 min Charges:  OT General Charges $OT Visit: 1 Visit OT Evaluation $OT Eval Moderate Complexity: 1 Mod OT Treatments $Therapeutic Activity: 8-22 mins  Malachy Chamber, OTR/L Acute Rehab Services Office: [phone]   Layla Maw 08/28/2021, 1:42 PM

## 2021-08-28 NOTE — Progress Notes (Signed)
Subjective: 1 Day Post-Op Procedure(s) (LRB): INTRAMEDULLARY (IM) NAIL INTERTROCHANTRIC (Left) OPEN REDUCTION INTERNAL FIXATION (ORIF) WRIST FRACTURE (Left) Patient reports pain as mild and moderate.  Mild hip pain, moderate wrist pain.  Objective: Vital signs in last 24 hours: Temp:  [97.4 F (36.3 C)-98.3 F (36.8 C)] 98.3 F (36.8 C) (06/10 0447) Pulse Rate:  [81-115] 93 (06/10 0447) Resp:  [14-18] 16 (06/10 0447) BP: (118-148)/(54-80) 123/61 (06/10 0447) SpO2:  [92 %-100 %] 96 % (06/10 0447) Weight:  [55.7 kg] 55.7 kg (06/09 0811)  Intake/Output from previous day: 06/09 0701 - 06/10 0700 In: 5093 [P.O.:600; I.V.:800; IV Piggyback:250] Out: 800 [Urine:700; Blood:100] Intake/Output this shift: No intake/output data recorded.  Recent Labs    08/26/21 0404 08/27/21 0719 08/28/21 0121  HGB 10.9* 9.7* 8.8*   Recent Labs    08/27/21 0719 08/28/21 0121  WBC 6.3 9.1  RBC 3.10* 2.83*  HCT 29.1* 26.5*  PLT 181 186   Recent Labs    08/27/21 0719 08/28/21 0121  NA 139 136  K 3.8 3.7  CL 108 107  CO2 27 23  BUN 12 13  CREATININE 0.62 0.80  GLUCOSE 105* 177*  CALCIUM 8.1* 7.9*   Recent Labs    08/26/21 0404  INR 1.0    Neurovascular intact Sensation intact distally Intact pulses distally Dorsiflexion/Plantar flexion intact Incision: dressing C/D/I Splint to left wrist, swelling in fingers, wiggles fingers cap refill intact  Assessment/Plan: 1 Day Post-Op Procedure(s) (LRB): INTRAMEDULLARY (IM) NAIL INTERTROCHANTRIC (Left) OPEN REDUCTION INTERNAL FIXATION (ORIF) WRIST FRACTURE (Left) Up with therapy  Patient be weightbearing as tolerated to the left lower extremity.  She will be weightbearing as tolerated through the elbow to the left upper extremity nonweightbearing through the wrist.  We will mobilize her with physical and Occupational Therapy.  She will be started on Lovenox for DVT prophylaxis while inpatient and discharged on a DOAC.    Ajani Rineer 08/28/2021, 8:01 AM

## 2021-08-28 NOTE — Progress Notes (Signed)
Patient ID: Jennifer Porter, female   DOB: 1945/07/30, 76 y.o.   MRN: [id number] [facility] Surgery Progress Note:   1 Day Post-Op  Subjective: Mental status is alert and appropriate.  Complaints sore. Objective: Vital signs in last 24 hours: Temp:  [97.4 F (36.3 C)-98.3 F (36.8 C)] 98.1 F (36.7 C) (06/10 0832) Pulse Rate:  [87-115] 87 (06/10 0832) Resp:  [14-18] 16 (06/10 0832) BP: (118-148)/(54-80) 127/65 (06/10 0832) SpO2:  [92 %-100 %] 96 % (06/10 0832)  Intake/Output from previous day: 06/09 0701 - 06/10 0700 In: 1761 [P.O.:600; I.V.:800; IV Piggyback:250] Out: 800 [Urine:700; Blood:100] Intake/Output this shift: No intake/output data recorded.  Physical Exam: Work of breathing is normal.  Left forearm and hip fractures from falling when rescuing her dog.   Lab Results:  Results for orders placed or performed during the hospital encounter of 08/26/21 (from the past 48 hour(s))  Glucose, capillary     Status: Abnormal   Collection Time: 08/26/21 11:38 AM  Result Value Ref Range   Glucose-Capillary 127 (H) 70 - 99 mg/dL    Comment: Glucose reference range applies only to samples taken after fasting for at least 8 hours.  Glucose, capillary     Status: Abnormal   Collection Time: 08/26/21  6:02 PM  Result Value Ref Range   Glucose-Capillary 114 (H) 70 - 99 mg/dL    Comment: Glucose reference range applies only to samples taken after fasting for at least 8 hours.  Glucose, capillary     Status: Abnormal   Collection Time: 08/26/21  8:22 PM  Result Value Ref Range   Glucose-Capillary 127 (H) 70 - 99 mg/dL    Comment: Glucose reference range applies only to samples taken after fasting for at least 8 hours.  Glucose, capillary     Status: None   Collection Time: 08/27/21 12:05 AM  Result Value Ref Range   Glucose-Capillary 87 70 - 99 mg/dL    Comment: Glucose reference range applies only to samples taken after fasting for at least 8 hours.  Surgical PCR screen      Status: None   Collection Time: 08/27/21  2:29 AM   Specimen: Nasal Mucosa; Nasal Swab  Result Value Ref Range   MRSA, PCR NEGATIVE NEGATIVE   Staphylococcus aureus NEGATIVE NEGATIVE    Comment: (NOTE) The Xpert SA Assay (FDA approved for NASAL specimens in patients 33 years of age and older), is one component of a comprehensive surveillance program. It is not intended to diagnose infection nor to guide or monitor treatment. Performed at [facility], [place] [address] [place], [place] [address]   Glucose, capillary     Status: Abnormal   Collection Time: 08/27/21  4:15 AM  Result Value Ref Range   Glucose-Capillary 127 (H) 70 - 99 mg/dL    Comment: Glucose reference range applies only to samples taken after fasting for at least 8 hours.  CBC     Status: Abnormal   Collection Time: 08/27/21  7:19 AM  Result Value Ref Range   WBC 6.3 4.0 - 10.5 K/uL   RBC 3.10 (L) 3.87 - 5.11 MIL/uL   Hemoglobin 9.7 (L) 12.0 - 15.0 g/dL   HCT 29.1 (L) 36.0 - 46.0 %   MCV 93.9 80.0 - 100.0 fL   MCH 31.3 26.0 - 34.0 pg   MCHC 33.3 30.0 - 36.0 g/dL   RDW 13.2 11.5 - 15.5 %   Platelets 181 150 - 400 K/uL  nRBC 0.0 0.0 - 0.2 %    Comment: Performed at [facility], [place] [address] [place], [place] [address]  Basic metabolic panel     Status: Abnormal   Collection Time: 08/27/21  7:19 AM  Result Value Ref Range   Sodium 139 135 - 145 mmol/L   Potassium 3.8 3.5 - 5.1 mmol/L   Chloride 108 98 - 111 mmol/L   CO2 27 22 - 32 mmol/L   Glucose, Bld 105 (H) 70 - 99 mg/dL    Comment: Glucose reference range applies only to samples taken after fasting for at least 8 hours.   BUN 12 8 - 23 mg/dL   Creatinine, Ser 0.62 0.44 - 1.00 mg/dL   Calcium 8.1 (L) 8.9 - 10.3 mg/dL   GFR, Estimated >60 >60 mL/min    Comment: (NOTE) Calculated using the CKD-EPI Creatinine Equation (2021)    Anion gap 4 (L) 5 - 15    Comment: Performed at [place] [facility] [address], [place], [place] [address]   Hemoglobin A1c     Status: Abnormal   Collection Time: 08/27/21  7:19 AM  Result Value Ref Range   Hgb A1c MFr Bld 6.4 (H) 4.8 - 5.6 %    Comment: (NOTE) Pre diabetes:          5.7%-6.4%  Diabetes:              >6.4%  Glycemic control for   <7.0% adults with diabetes    Mean Plasma Glucose 136.98 mg/dL    Comment: Performed at [facility] [address] [place], [place] [address]  Glucose, capillary     Status: Abnormal   Collection Time: 08/27/21  7:46 AM  Result Value Ref Range   Glucose-Capillary 104 (H) 70 - 99 mg/dL    Comment: Glucose reference range applies only to samples taken after fasting for at least 8 hours.  Glucose, capillary     Status: Abnormal   Collection Time: 08/27/21 11:28 AM  Result Value Ref Range   Glucose-Capillary 124 (H) 70 - 99 mg/dL    Comment: Glucose reference range applies only to samples taken after fasting for at least 8 hours.  Glucose, capillary     Status: Abnormal   Collection Time: 08/27/21  1:14 PM  Result Value Ref Range   Glucose-Capillary 161 (H) 70 - 99 mg/dL    Comment: Glucose reference range applies only to samples taken after fasting for at least 8 hours.  Glucose, capillary     Status: Abnormal   Collection Time: 08/27/21  5:15 PM  Result Value Ref Range   Glucose-Capillary 198 (H) 70 - 99 mg/dL    Comment: Glucose reference range applies only to samples taken after fasting for at least 8 hours.  Glucose, capillary     Status: Abnormal   Collection Time: 08/27/21  8:28 PM  Result Value Ref Range   Glucose-Capillary 238 (H) 70 - 99 mg/dL    Comment: Glucose reference range applies only to samples taken after fasting for at least 8 hours.  CBC     Status: Abnormal   Collection Time: 08/28/21  1:21 AM  Result Value Ref Range   WBC 9.1 4.0 - 10.5 K/uL   RBC 2.83 (L) 3.87 - 5.11 MIL/uL   Hemoglobin 8.8 (L) 12.0 - 15.0 g/dL   HCT 26.5 (L) 36.0 - 46.0 %   MCV 93.6 80.0 - 100.0 fL   MCH 31.1 26.0 -  34.0 pg   MCHC  33.2 30.0 - 36.0 g/dL   RDW 13.1 11.5 - 15.5 %   Platelets 186 150 - 400 K/uL   nRBC 0.0 0.0 - 0.2 %    Comment: Performed at [facility], [place] [address], [place], [place] [address]  Basic metabolic panel     Status: Abnormal   Collection Time: 08/28/21  1:21 AM  Result Value Ref Range   Sodium 136 135 - 145 mmol/L   Potassium 3.7 3.5 - 5.1 mmol/L   Chloride 107 98 - 111 mmol/L   CO2 23 22 - 32 mmol/L   Glucose, Bld 177 (H) 70 - 99 mg/dL    Comment: Glucose reference range applies only to samples taken after fasting for at least 8 hours.   BUN 13 8 - 23 mg/dL   Creatinine, Ser 0.80 0.44 - 1.00 mg/dL   Calcium 7.9 (L) 8.9 - 10.3 mg/dL   GFR, Estimated >60 >60 mL/min    Comment: (NOTE) Calculated using the CKD-EPI Creatinine Equation (2021)    Anion gap 6 5 - 15    Comment: Performed at [place] [address], [place], [place] [address]  Glucose, capillary     Status: Abnormal   Collection Time: 08/28/21  9:02 AM  Result Value Ref Range   Glucose-Capillary 123 (H) 70 - 99 mg/dL    Comment: Glucose reference range applies only to samples taken after fasting for at least 8 hours.    Radiology/Results: DG Wrist 2 Views Left  Result Date: 08/27/2021 CLINICAL DATA:  Fracture radius and ulna EXAM: LEFT WRIST - 2 VIEW COMPARISON:  08/26/2021 FINDINGS: There is interval reduction and internal fixation of comminuted fracture of distal left radius with metallic plate and multiple surgical screws. There is comminuted fracture in the ulnar styloid with no significant change. Degenerative changes are noted in first carpometacarpal and first metacarpophalangeal joints. IMPRESSION: Interval reduction and internal fixation of comminuted fracture of distal left radius. Electronically Signed   By: Elmer Picker M.D.   On: 08/27/2021 16:21   DG HIP PORT UNILAT W OR W/O PELVIS 1V LEFT  Result Date: 08/27/2021 CLINICAL DATA:  Internal fixation of intertrochanteric fracture of  left femur EXAM: DG HIP (WITH OR WITHOUT PELVIS) 1V PORT LEFT COMPARISON:  08/26/2021 FINDINGS: There is interval internal fixation of comminuted intertrochanteric fracture of proximal left femur. There are pockets of air in the soft tissues. There is medial displacement of lesser trochanter which has not changed. IMPRESSION: Interval internal fixation of intertrochanteric fracture of left femur. Electronically Signed   By: Elmer Picker M.D.   On: 08/27/2021 11:58   DG Wrist Complete Left  Result Date: 08/27/2021 CLINICAL DATA:  Left wrist ORIF. EXAM: LEFT WRIST - COMPLETE 3+ VIEW COMPARISON:  Left wrist x-rays from yesterday. FLUOROSCOPY TIME:  Radiation Exposure Index (as provided by the fluoroscopic device): 11.69 mGy Kerma C-arm fluoroscopic images were obtained intraoperatively and submitted for post operative interpretation. FINDINGS: Multiple intraoperative fluoroscopic images demonstrate interval volar plate and screw fixation of the distal radius fracture, now in anatomic alignment. IMPRESSION: 1. Intraoperative fluoroscopic guidance for distal radius fracture ORIF. Electronically Signed   By: Titus Dubin M.D.   On: 08/27/2021 11:29   DG FEMUR MIN 2 VIEWS LEFT  Result Date: 08/27/2021 CLINICAL DATA:  Left femoral neck fracture. EXAM: LEFT FEMUR 2 VIEWS COMPARISON:  08/26/2021 FINDINGS: 4 intraoperative spot fluoro films obtained during ORIF for intertrochanteric left femoral neck  fracture. No evidence for immediate hardware complication. IMPRESSION: Intraoperative assessment during ORIF for intertrochanteric left femoral neck fracture. Electronically Signed   By: Misty Stanley M.D.   On: 08/27/2021 11:29   DG C-Arm 1-60 Min-No Report  Result Date: 08/27/2021 Fluoroscopy was utilized by the requesting physician.  No radiographic interpretation.   DG C-Arm 1-60 Min-No Report  Result Date: 08/27/2021 Fluoroscopy was utilized by the requesting physician.  No radiographic interpretation.     Anti-infectives: Anti-infectives (From admission, onward)    Start     Dose/Rate Route Frequency Ordered Stop   08/27/21 1700  ceFAZolin (ANCEF) IVPB 2g/100 mL premix        2 g 200 mL/hr over 30 Minutes Intravenous Every 8 hours 08/27/21 1305 08/28/21 1659   08/27/21 1053  vancomycin (VANCOCIN) powder  Status:  Discontinued          As needed 08/27/21 1053 08/27/21 1122   08/27/21 0815  ceFAZolin (ANCEF) IVPB 2g/100 mL premix        2 g 200 mL/hr over 30 Minutes Intravenous On call to O.R. 08/27/21 0808 08/27/21 0905       Assessment/Plan: Problem List: Patient Active Problem List   Diagnosis Date Noted   Multiple injuries due to trauma 08/26/2021   Colon cancer screening 07/29/2021   Hyperlipidemia associated with type 2 diabetes mellitus ([place]) 11/28/2020   Acute recurrent pansinusitis 07/20/2020   Urinary retention 01/15/2020   Urinary urgency 01/15/2020   Abdominal cramping 08/21/2019   Protein-calorie malnutrition ([place]) 02/18/2019   Chronic pancreatitis ([place]) 08/24/2018   Loose stools 08/24/2018   Pancreatic calcification 08/22/2018   Acute pancreatitis without infection or necrosis 08/09/2018   Sigmoid diverticulosis 08/09/2018   3-vessel CAD 08/09/2018   Aortic atherosclerosis ([place]) 08/09/2018   Myelolipoma of right adrenal gland 08/09/2018   Renal cyst, right 08/09/2018   Hormone replacement therapy 01/12/2017   Hot flashes due to menopause 01/12/2017   Hypomagnesemia 06/27/2016   Diarrhea 03/28/2016   Vitamin D deficiency 10/03/2014   Left knee pain 07/02/2014   Hypertension    Gastroesophageal reflux disease    Type 2 diabetes mellitus with other specified complication ([place])    Allergy    Asthma    H/O: gout    Hyperlipidemia     Stable and awaiting PT to assist with mobility.   1 Day Post-Op    LOS: 2 days   Matt B. Hassell Done, MD, [facility] [phone] to reach the surgeon on call.    08/28/2021 9:06 AM

## 2021-08-28 NOTE — Progress Notes (Signed)
Inpatient Rehab Admissions Coordinator:   Per therapy recommendations, patient was screened for CIR candidacy by Megan Salon, MS, CCC-SLP. At this time, Pt. does not appear to demonstrate medical necessity to make a case with pt.'s insurance and to justify in hospital rehabilitation/CIR. I will not pursue a rehab consult for this Pt.   Recommend other rehab venues to be pursued.  Please contact me with any questions.  Megan Salon, MS, CCC-SLP Rehab Admissions Coordinator  [phone] (celll) [phone] (office)

## 2021-08-28 NOTE — Evaluation (Signed)
Physical Therapy Evaluation Patient Details Name: Jennifer Porter MRN: [id number] DOB: 1945/11/08 Today's Date: 08/28/2021  History of Present Illness  Pt is a 77 y/o female who presented s/p mechanical fall after an altercation involving her dog and a possum outside. Imaging showed L distal radius fx and L femoral neck fx. On 6/9, pt underwent IM nailing of L LE and ORIF of L wrist. PMH: DM, GERD, gout, HLD, HTN, pancreatitis  Clinical Impression  Patient independent prior to [place].  Currently, patient presents with dependencies in gait and mobility due to pain, limited weight bearing status and decreased balance.  Patient has husband at home but he is limited in what he can provide.  Recommend Inpatient Rehab stay prior to discharge home to maximize independence.  Anticipate patient will make steady gains as pain subsides.         Recommendations for follow up therapy are one component of a multi-disciplinary discharge planning process, led by the attending physician.  Recommendations may be updated based on patient status, additional functional criteria and insurance authorization.  Follow Up Recommendations Acute inpatient rehab (3hours/day)    Assistance Recommended at Discharge Intermittent Supervision/Assistance  Patient can return home with the following  A lot of help with walking and/or transfers;A lot of help with bathing/dressing/bathroom;Assistance with cooking/housework    Equipment Recommendations Other (comment) (platform attachment left)  Recommendations for Other Services  Rehab consult    Functional Status Assessment Patient has had a recent decline in their functional status and demonstrates the ability to make significant improvements in function in a reasonable and predictable amount of time.     Precautions / Restrictions Precautions Precautions: Fall;Other (comment) Precaution Comments: L UE sling present on eval though no orders present Required Braces or  Orthoses: Sling Restrictions Weight Bearing Restrictions: Yes LUE Weight Bearing: Weight bear through elbow only LLE Weight Bearing: Weight bearing as tolerated      Mobility  Bed Mobility Overal bed mobility: Needs Assistance Bed Mobility: Supine to Sit     Supine to sit: Max assist, +2 for physical assistance          Transfers Overall transfer level: Needs assistance Equipment used: None Transfers: Bed to chair/wheelchair/BSC   Stand pivot transfers: Max assist              Ambulation/Gait                  Stairs            Wheelchair Mobility    Modified Rankin (Stroke Patients Only)       Balance Overall balance assessment: Needs assistance Sitting-balance support: Single extremity supported, Feet supported Sitting balance-Leahy Scale: Poor                                       Pertinent Vitals/Pain Pain Assessment Pain Assessment: 0-10 Pain Score: 7  Pain Location: left hip during transfer Pain Descriptors / Indicators: Discomfort, Guarding, Grimacing Pain Intervention(s): Limited activity within patient's tolerance, Monitored during session, Repositioned    Home Living Family/patient expects to be discharged to:: Private residence Living Arrangements: Spouse/significant other Available Help at Discharge: Family;Available PRN/intermittently (husband is limited in what he can provide, cannot provide physical assistance, can help with meals.) Type of Home: House Home Access: Ramped entrance       Home Layout: One level Home Equipment: Rolling Walker (2 wheels);BSC/3in1;Wheelchair - manual (  access to [facility] and w/c)      Prior Function Prior Level of Function : Independent/Modified Independent;Driving                     Hand Dominance   Dominant Hand: Right    Extremity/Trunk Assessment   Upper Extremity Assessment Upper Extremity Assessment: Defer to OT evaluation    Lower Extremity  Assessment Lower Extremity Assessment: LLE deficits/detail LLE: Unable to fully assess due to pain    Cervical / Trunk Assessment Cervical / Trunk Assessment: Normal  Communication   Communication: No difficulties  Cognition Arousal/Alertness: Awake/alert Behavior During Therapy: WFL for tasks assessed/performed Overall Cognitive Status: Within Functional Limits for tasks assessed                                          General Comments      Exercises General Exercises - Lower Extremity Ankle Circles/Pumps: Both, 10 reps, Seated, AROM   Assessment/Plan    PT Assessment Patient needs continued PT services  PT Problem List Decreased activity tolerance;Decreased balance;Decreased mobility;Decreased knowledge of use of DME;Pain       PT Treatment Interventions DME instruction;Gait training;Functional mobility training;Therapeutic activities;Stair training;Therapeutic exercise;Balance training;Patient/family education    PT Goals (Current goals can be found in the Care Plan section)  Acute Rehab PT Goals Patient Stated Goal: get home when I can PT Goal Formulation: With patient/family Time For Goal Achievement: 09/11/21 Potential to Achieve Goals: Good    Frequency Min 3X/week     Co-evaluation               AM-PAC PT "6 Clicks" Mobility  Outcome Measure Help needed turning from your back to your side while in a flat bed without using bedrails?: A Lot Help needed moving from lying on your back to sitting on the side of a flat bed without using bedrails?: A Lot Help needed moving to and from a bed to a chair (including a wheelchair)?: A Lot Help needed standing up from a chair using your arms (e.g., wheelchair or bedside chair)?: A Lot Help needed to walk in hospital room?: Total Help needed climbing 3-5 steps with a railing? : Total 6 Click Score: 10    End of Session   Activity Tolerance: Patient limited by pain Patient left: in chair;with  call bell/phone within reach;with family/visitor present Nurse Communication: Mobility status PT Visit Diagnosis: Other abnormalities of gait and mobility (R26.89);Difficulty in walking, not elsewhere classified (R26.2)    Time: 1110-1130 PT Time Calculation (min) (ACUTE ONLY): 20 min   Charges:   PT Evaluation $PT Eval Moderate Complexity: 1 Mod          08/28/2021 Margie, PT Acute Rehabilitation Services Office:  [phone]   Shanna Cisco 08/28/2021, 12:51 PM

## 2021-08-29 ENCOUNTER — Encounter (HOSPITAL_COMMUNITY): Payer: Self-pay | Admitting: Student

## 2021-08-29 LAB — VITAMIN D 25 HYDROXY (VIT D DEFICIENCY, FRACTURES): Vit D, 25-Hydroxy: 24.98 ng/mL — ABNORMAL LOW (ref 30–100)

## 2021-08-29 LAB — CBC
HCT: 26.9 % — ABNORMAL LOW (ref 36.0–46.0)
Hemoglobin: 9.2 g/dL — ABNORMAL LOW (ref 12.0–15.0)
MCH: 31.6 pg (ref 26.0–34.0)
MCHC: 34.2 g/dL (ref 30.0–36.0)
MCV: 92.4 fL (ref 80.0–100.0)
Platelets: 205 10*3/uL (ref 150–400)
RBC: 2.91 MIL/uL — ABNORMAL LOW (ref 3.87–5.11)
RDW: 13.3 % (ref 11.5–15.5)
WBC: 8.7 10*3/uL (ref 4.0–10.5)
nRBC: 0 % (ref 0.0–0.2)

## 2021-08-29 LAB — GLUCOSE, CAPILLARY
Glucose-Capillary: 148 mg/dL — ABNORMAL HIGH (ref 70–99)
Glucose-Capillary: 189 mg/dL — ABNORMAL HIGH (ref 70–99)
Glucose-Capillary: 272 mg/dL — ABNORMAL HIGH (ref 70–99)
Glucose-Capillary: 109 mg/dL — ABNORMAL HIGH (ref 70–99)

## 2021-08-29 NOTE — Progress Notes (Addendum)
ORTHOPEDIC PROGRESS NOTE  Subjective: 2 Days Post-Op Procedure(s) (LRB): INTRAMEDULLARY (IM) NAIL INTERTROCHANTRIC (Left) OPEN REDUCTION INTERNAL FIXATION (ORIF) WRIST FRACTURE (Left) Patient reports pain as mild and moderate.  Mild hip pain, moderate wrist pain.  Notable swelling in the hand, splint feels a little snug.  Objective: Vital signs in last 24 hours: Temp:  [98.1 F (36.7 C)-99.2 F (37.3 C)] 99.2 F (37.3 C) (06/11 0435) Pulse Rate:  [87-99] 99 (06/11 0435) Resp:  [16-17] 17 (06/11 0435) BP: (118-148)/(50-66) 148/66 (06/11 0435) SpO2:  [92 %-98 %] 98 % (06/11 0435)  Intake/Output from previous day: 06/10 0701 - 06/11 0700 In: 340 [P.O.:240; IV Piggyback:100] Out: 600 [Urine:600] Intake/Output this shift: No intake/output data recorded.  Recent Labs    08/27/21 0719 08/28/21 0121 08/29/21 0106  HGB 9.7* 8.8* 9.2*   Recent Labs    08/28/21 0121 08/29/21 0106  WBC 9.1 8.7  RBC 2.83* 2.91*  HCT 26.5* 26.9*  PLT 186 205   Recent Labs    08/27/21 0719 08/28/21 0121  NA 139 136  K 3.8 3.7  CL 108 107  CO2 27 23  BUN 12 13  CREATININE 0.62 0.80  GLUCOSE 105* 177*  CALCIUM 8.1* 7.9*   No results for input(s): "LABPT", "INR" in the last 72 hours.   Neurovascular intact Sensation intact distally Intact pulses distally Dorsiflexion/Plantar flexion intact Incision: dressing C/D/I Splint to left wrist, swelling in fingers.  Loosened splint which patient notes is more comfortable.  Wiggles fingers.  Cap refill intact  Assessment/Plan: 2 Days Post-Op Procedure(s) (LRB): INTRAMEDULLARY (IM) NAIL INTERTROCHANTRIC (Left) OPEN REDUCTION INTERNAL FIXATION (ORIF) WRIST FRACTURE (Left) Up with therapy  Patient be weightbearing as tolerated to the left lower extremity.  She will be weightbearing as tolerated through the elbow to the left upper extremity nonweightbearing through the wrist.  We will mobilize her with physical and Occupational Therapy.  She  will be started on Lovenox for DVT prophylaxis while inpatient and discharged on a DOAC.  Therapies initially recommended CIR, per admission coordinator evaluation patient is not a candidate.  Will likely need SNF versus home health.  Corinne Ports 08/29/2021, 8:07 AM

## 2021-08-29 NOTE — Plan of Care (Signed)

## 2021-08-29 NOTE — Progress Notes (Signed)
Patient ID: Jennifer Porter, female   DOB: June 18, 1945, 76 y.o.   MRN: [id number] [facility] Surgery Progress Note:   2 Days Post-Op  Subjective: Mental status is clear.  Complaints she needs her [place] to eat. Objective: Vital signs in last 24 hours: Temp:  [98.1 F (36.7 C)-99.2 F (37.3 C)] 99.2 F (37.3 C) (06/11 0435) Pulse Rate:  [87-99] 99 (06/11 0435) Resp:  [16-17] 17 (06/11 0435) BP: (118-148)/(50-66) 148/66 (06/11 0435) SpO2:  [92 %-98 %] 98 % (06/11 0435)  Intake/Output from previous day: 06/10 0701 - 06/11 0700 In: 340 [P.O.:240; IV Piggyback:100] Out: 600 [Urine:600] Intake/Output this shift: No intake/output data recorded.  Physical Exam: Work of breathing is normal.  Left wrist with splint; post left hip for fracture  Lab Results:  Results for orders placed or performed during the hospital encounter of 08/26/21 (from the past 48 hour(s))  Glucose, capillary     Status: Abnormal   Collection Time: 08/27/21 11:28 AM  Result Value Ref Range   Glucose-Capillary 124 (H) 70 - 99 mg/dL    Comment: Glucose reference range applies only to samples taken after fasting for at least 8 hours.  Glucose, capillary     Status: Abnormal   Collection Time: 08/27/21  1:14 PM  Result Value Ref Range   Glucose-Capillary 161 (H) 70 - 99 mg/dL    Comment: Glucose reference range applies only to samples taken after fasting for at least 8 hours.  Glucose, capillary     Status: Abnormal   Collection Time: 08/27/21  5:15 PM  Result Value Ref Range   Glucose-Capillary 198 (H) 70 - 99 mg/dL    Comment: Glucose reference range applies only to samples taken after fasting for at least 8 hours.  Glucose, capillary     Status: Abnormal   Collection Time: 08/27/21  8:28 PM  Result Value Ref Range   Glucose-Capillary 238 (H) 70 - 99 mg/dL    Comment: Glucose reference range applies only to samples taken after fasting for at least 8 hours.  CBC     Status: Abnormal   Collection Time:  08/28/21  1:21 AM  Result Value Ref Range   WBC 9.1 4.0 - 10.5 K/uL   RBC 2.83 (L) 3.87 - 5.11 MIL/uL   Hemoglobin 8.8 (L) 12.0 - 15.0 g/dL   HCT 26.5 (L) 36.0 - 46.0 %   MCV 93.6 80.0 - 100.0 fL   MCH 31.1 26.0 - 34.0 pg   MCHC 33.2 30.0 - 36.0 g/dL   RDW 13.1 11.5 - 15.5 %   Platelets 186 150 - 400 K/uL   nRBC 0.0 0.0 - 0.2 %    Comment: Performed at [facility], [place] [address] [place],  [address]  Basic metabolic panel     Status: Abnormal   Collection Time: 08/28/21  1:21 AM  Result Value Ref Range   Sodium 136 135 - 145 mmol/L   Potassium 3.7 3.5 - 5.1 mmol/L   Chloride 107 98 - 111 mmol/L   CO2 23 22 - 32 mmol/L   Glucose, Bld 177 (H) 70 - 99 mg/dL    Comment: Glucose reference range applies only to samples taken after fasting for at least 8 hours.   BUN 13 8 - 23 mg/dL   Creatinine, Ser 0.80 0.44 - 1.00 mg/dL   Calcium 7.9 (L) 8.9 - 10.3 mg/dL   GFR, Estimated >60 >60 mL/min    Comment: (NOTE) Calculated using the CKD-EPI  Creatinine Equation (2021)    Anion gap 6 5 - 15    Comment: Performed at [facility], [place] [address], [place], [place] [address]  Glucose, capillary     Status: Abnormal   Collection Time: 08/28/21  9:02 AM  Result Value Ref Range   Glucose-Capillary 123 (H) 70 - 99 mg/dL    Comment: Glucose reference range applies only to samples taken after fasting for at least 8 hours.  Glucose, capillary     Status: Abnormal   Collection Time: 08/28/21 12:46 PM  Result Value Ref Range   Glucose-Capillary 221 (H) 70 - 99 mg/dL    Comment: Glucose reference range applies only to samples taken after fasting for at least 8 hours.  Glucose, capillary     Status: Abnormal   Collection Time: 08/28/21  6:20 PM  Result Value Ref Range   Glucose-Capillary 202 (H) 70 - 99 mg/dL    Comment: Glucose reference range applies only to samples taken after fasting for at least 8 hours.  Glucose, capillary     Status: Abnormal   Collection Time: 08/28/21   9:31 PM  Result Value Ref Range   Glucose-Capillary 148 (H) 70 - 99 mg/dL    Comment: Glucose reference range applies only to samples taken after fasting for at least 8 hours.  CBC     Status: Abnormal   Collection Time: 08/29/21  1:06 AM  Result Value Ref Range   WBC 8.7 4.0 - 10.5 K/uL   RBC 2.91 (L) 3.87 - 5.11 MIL/uL   Hemoglobin 9.2 (L) 12.0 - 15.0 g/dL   HCT 26.9 (L) 36.0 - 46.0 %   MCV 92.4 80.0 - 100.0 fL   MCH 31.6 26.0 - 34.0 pg   MCHC 34.2 30.0 - 36.0 g/dL   RDW 13.3 11.5 - 15.5 %   Platelets 205 150 - 400 K/uL   nRBC 0.0 0.0 - 0.2 %    Comment: Performed at [facility], [place] [address], [place], [place] [address]  Glucose, capillary     Status: Abnormal   Collection Time: 08/29/21  7:39 AM  Result Value Ref Range   Glucose-Capillary 148 (H) 70 - 99 mg/dL    Comment: Glucose reference range applies only to samples taken after fasting for at least 8 hours.    Radiology/Results: DG Wrist 2 Views Left  Result Date: 08/27/2021 CLINICAL DATA:  Fracture radius and ulna EXAM: LEFT WRIST - 2 VIEW COMPARISON:  08/26/2021 FINDINGS: There is interval reduction and internal fixation of comminuted fracture of distal left radius with metallic plate and multiple surgical screws. There is comminuted fracture in the ulnar styloid with no significant change. Degenerative changes are noted in first carpometacarpal and first metacarpophalangeal joints. IMPRESSION: Interval reduction and internal fixation of comminuted fracture of distal left radius. Electronically Signed   By: Elmer Picker M.D.   On: 08/27/2021 16:21   DG HIP PORT UNILAT W OR W/O PELVIS 1V LEFT  Result Date: 08/27/2021 CLINICAL DATA:  Internal fixation of intertrochanteric fracture of left femur EXAM: DG HIP (WITH OR WITHOUT PELVIS) 1V PORT LEFT COMPARISON:  08/26/2021 FINDINGS: There is interval internal fixation of comminuted intertrochanteric fracture of proximal left femur. There are pockets of air in the soft  tissues. There is medial displacement of lesser trochanter which has not changed. IMPRESSION: Interval internal fixation of intertrochanteric fracture of left femur. Electronically Signed   By: Elmer Picker M.D.   On: 08/27/2021 11:58  DG Wrist Complete Left  Result Date: 08/27/2021 CLINICAL DATA:  Left wrist ORIF. EXAM: LEFT WRIST - COMPLETE 3+ VIEW COMPARISON:  Left wrist x-rays from yesterday. FLUOROSCOPY TIME:  Radiation Exposure Index (as provided by the fluoroscopic device): 11.69 mGy Kerma C-arm fluoroscopic images were obtained intraoperatively and submitted for post operative interpretation. FINDINGS: Multiple intraoperative fluoroscopic images demonstrate interval volar plate and screw fixation of the distal radius fracture, now in anatomic alignment. IMPRESSION: 1. Intraoperative fluoroscopic guidance for distal radius fracture ORIF. Electronically Signed   By: Titus Dubin M.D.   On: 08/27/2021 11:29   DG FEMUR MIN 2 VIEWS LEFT  Result Date: 08/27/2021 CLINICAL DATA:  Left femoral neck fracture. EXAM: LEFT FEMUR 2 VIEWS COMPARISON:  08/26/2021 FINDINGS: 4 intraoperative spot fluoro films obtained during ORIF for intertrochanteric left femoral neck fracture. No evidence for immediate hardware complication. IMPRESSION: Intraoperative assessment during ORIF for intertrochanteric left femoral neck fracture. Electronically Signed   By: Misty Stanley M.D.   On: 08/27/2021 11:29   DG C-Arm 1-60 Min-No Report  Result Date: 08/27/2021 Fluoroscopy was utilized by the requesting physician.  No radiographic interpretation.   DG C-Arm 1-60 Min-No Report  Result Date: 08/27/2021 Fluoroscopy was utilized by the requesting physician.  No radiographic interpretation.    Anti-infectives: Anti-infectives (From admission, onward)    Start     Dose/Rate Route Frequency Ordered Stop   08/27/21 1700  ceFAZolin (ANCEF) IVPB 2g/100 mL premix        2 g 200 mL/hr over 30 Minutes Intravenous Every 8  hours 08/27/21 1305 08/28/21 1002   08/27/21 1053  vancomycin (VANCOCIN) powder  Status:  Discontinued          As needed 08/27/21 1053 08/27/21 1122   08/27/21 0815  ceFAZolin (ANCEF) IVPB 2g/100 mL premix        2 g 200 mL/hr over 30 Minutes Intravenous On call to O.R. 08/27/21 0808 08/27/21 0905       Assessment/Plan: Problem List: Patient Active Problem List   Diagnosis Date Noted   Multiple injuries due to trauma 08/26/2021   Colon cancer screening 07/29/2021   Hyperlipidemia associated with type 2 diabetes mellitus ([place]) 11/28/2020   Acute recurrent pansinusitis 07/20/2020   Urinary retention 01/15/2020   Urinary urgency 01/15/2020   Abdominal cramping 08/21/2019   Protein-calorie malnutrition ([place]) 02/18/2019   Chronic pancreatitis ([place]) 08/24/2018   Loose stools 08/24/2018   Pancreatic calcification 08/22/2018   Acute pancreatitis without infection or necrosis 08/09/2018   Sigmoid diverticulosis 08/09/2018   3-vessel CAD 08/09/2018   Aortic atherosclerosis ([place]) 08/09/2018   Myelolipoma of right adrenal gland 08/09/2018   Renal cyst, right 08/09/2018   Hormone replacement therapy 01/12/2017   Hot flashes due to menopause 01/12/2017   Hypomagnesemia 06/27/2016   Diarrhea 03/28/2016   Vitamin D deficiency 10/03/2014   Left knee pain 07/02/2014   Hypertension    Gastroesophageal reflux disease    Type 2 diabetes mellitus with other specified complication (HCC)    Allergy    Asthma    H/O: gout    Hyperlipidemia     Awaiting in patient CIR.  She is concerned about her husband at home since she does his peritoneal dialysis 2 Days Post-Op    LOS: 3 days   Matt B. Hassell Done, MD, [facility] [phone] to reach the surgeon on call.    08/29/2021 8:30 AM

## 2021-08-30 LAB — GLUCOSE, CAPILLARY
Glucose-Capillary: 139 mg/dL — ABNORMAL HIGH (ref 70–99)
Glucose-Capillary: 237 mg/dL — ABNORMAL HIGH (ref 70–99)
Glucose-Capillary: 236 mg/dL — ABNORMAL HIGH (ref 70–99)
Glucose-Capillary: 267 mg/dL — ABNORMAL HIGH (ref 70–99)
Glucose-Capillary: 157 mg/dL — ABNORMAL HIGH (ref 70–99)

## 2021-08-30 NOTE — Anesthesia Postprocedure Evaluation (Signed)
Anesthesia Post Note  Patient: Jennifer Porter  Procedure(s) Performed: INTRAMEDULLARY (IM) NAIL INTERTROCHANTRIC (Left) OPEN REDUCTION INTERNAL FIXATION (ORIF) WRIST FRACTURE (Left: Wrist)     Patient location during evaluation: PACU Anesthesia Type: General Level of consciousness: awake and alert Pain management: pain level controlled Vital Signs Assessment: post-procedure vital signs reviewed and stable Respiratory status: spontaneous breathing, nonlabored ventilation, respiratory function stable and patient connected to nasal cannula oxygen Cardiovascular status: blood pressure returned to baseline and stable Postop Assessment: no apparent nausea or vomiting Anesthetic complications: no   No notable events documented.            Effie Berkshire

## 2021-08-30 NOTE — Progress Notes (Signed)
Patient ID: Jennifer Porter, female   DOB: [birth year], 76 y.o.   MRN: [id number] [facility] Surgery Progress Note  3 Days Post-Op  Subjective: Tired after working with both PT/OT.  No other complaints. Pain well controlled. Eating well and had a BM this morning.  Husband at bedside with her.  Objective: Vital signs in last 24 hours: Temp:  [98 F (36.7 C)-98.3 F (36.8 C)] 98.2 F (36.8 C) (06/12 0814) Pulse Rate:  [86-96] 96 (06/12 0814) Resp:  [16-18] 16 (06/12 0814) BP: (106-153)/(49-78) 153/73 (06/12 0814) SpO2:  [94 %-99 %] 95 % (06/12 0814) Last BM Date : 08/25/21  Intake/Output from previous day: 06/11 0701 - 06/12 0700 In: 1719.9 [I.V.:1514.7; IV Piggyback:205.2] Out: -  Intake/Output this shift: No intake/output data recorded.  PE: Gen:  Alert, NAD, pleasant HEENT: EOM's intact, pupils equal and round Card:  many ectopic beats, monitor shows frequent PVCs. Right radial pulse and B pedal pulses noted Pulm:  effort normal Abd: Soft, NT/ND Ext:  calves soft and nontender. Splint to LUE. Extremities well perfused Psych: A&Ox4 Skin: no rashes noted, warm and dry  Lab Results:  Recent Labs    08/28/21 0121 08/29/21 0106  WBC 9.1 8.7  HGB 8.8* 9.2*  HCT 26.5* 26.9*  PLT 186 205   BMET Recent Labs    08/28/21 0121  NA 136  K 3.7  CL 107  CO2 23  GLUCOSE 177*  BUN 13  CREATININE 0.80  CALCIUM 7.9*   PT/INR No results for input(s): "LABPROT", "INR" in the last 72 hours.  CMP     Component Value Date/Time   NA 136 08/28/2021 0121   K 3.7 08/28/2021 0121   CL 107 08/28/2021 0121   CO2 23 08/28/2021 0121   GLUCOSE 177 (H) 08/28/2021 0121   BUN 13 08/28/2021 0121   CREATININE 0.80 08/28/2021 0121   CREATININE 0.70 03/29/2021 0906   CALCIUM 7.9 (L) 08/28/2021 0121   PROT 6.9 11/24/2020 0809   ALBUMIN 4.4 08/24/2018 1138   AST 20 11/24/2020 0809   ALT 15 11/24/2020 0809   ALKPHOS 17 (L) 08/24/2018 1138   BILITOT 0.5 11/24/2020 0809    GFRNONAA >60 08/28/2021 0121   GFRNONAA 88 07/20/2020 1042   GFRAA 102 07/20/2020 1042   Lipase     Component Value Date/Time   LIPASE 72 (H) 08/24/2018 1138       Studies/Results: No results found.  Anti-infectives: Anti-infectives (From admission, onward)    Start     Dose/Rate Route Frequency Ordered Stop   08/27/21 1700  ceFAZolin (ANCEF) IVPB 2g/100 mL premix        2 g 200 mL/hr over 30 Minutes Intravenous Every 8 hours 08/27/21 1305 08/28/21 1002   08/27/21 1053  vancomycin (VANCOCIN) powder  Status:  Discontinued          As needed 08/27/21 1053 08/27/21 1122   08/27/21 0815  ceFAZolin (ANCEF) IVPB 2g/100 mL premix        2 g 200 mL/hr over 30 Minutes Intravenous On call to O.R. 08/27/21 0808 08/27/21 0905        Assessment/Plan Fall SAH/SDH - tiny on initial CT. Per NSGY, no need to repeat CT unless change in neuro exam. Keppra x7 days Left wrist fx - s/p ORIF 6/9 Dr. Doreatha Martin. WBAT thru left elbow and NWB thru the wrist Left intratrochanteric femur fx - s/p IMN 6/9 Dr. Doreatha Martin. WBAT LLE ABL anemia - Hgb 9.2, stable HTN - home  lisinopril HLD GERD - protonix Chronic pancreatitis - home creon DM - SSI   ID - ancef periop  VTE - SCDs, start LMWH 6/10. Plan to dc on DOAC per ortho FEN - SLIV, CM diet Foley - none   Dispo - possible CIR, insurance auth being submitted.  Spoke to [place] regarding this today.   I reviewed Consultant ortho notes, last 24 h vitals and pain scores, last 48 h intake and output, last 24 h labs and trends, and last 24 h imaging results.    LOS: 4 days    Henreitta Cea, [facility] 08/30/2021, 11:59 AM Please see Amion for pager number during day hours 7:00am-4:30pm
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
## 2021-08-30 NOTE — Progress Notes (Addendum)
Inpatient Rehabilitation Admissions Coordinator   I met with patient and her spouse at bedside. We discussed goals and expectations of a possible CIR admit. They prefer CIR rather than SNF. I await updated PT from today and then will begin Auth with [place] for possible Cir admit. Spouse can provide 24/7 min assist , just not lifting.  Danne Baxter, RN, MSN Rehab Admissions Coordinator [phone] 08/30/2021 12:43 PM
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
## 2021-08-30 NOTE — Care Management Important Message (Signed)
Important Message  Patient Details  Name: Jennifer Porter MRN: [id number] Date of Birth: 1945/09/12   Medicare Important Message Given:  Yes     Memory Argue 08/30/2021, 3:39 PM
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
## 2021-08-31 LAB — GLUCOSE, CAPILLARY
Glucose-Capillary: 178 mg/dL — ABNORMAL HIGH (ref 70–99)
Glucose-Capillary: 221 mg/dL — ABNORMAL HIGH (ref 70–99)
Glucose-Capillary: 184 mg/dL — ABNORMAL HIGH (ref 70–99)
Glucose-Capillary: 209 mg/dL — ABNORMAL HIGH (ref 70–99)

## 2021-08-31 MED ORDER — LEVETIRACETAM 500 MG PO TABS
500.0000 mg | ORAL_TABLET | Freq: Two times a day (BID) | ORAL | Status: DC
  Administered 2021-08-31 – 2021-09-01 (×3): 500 mg via ORAL
  Filled 2021-08-31 (×3): qty 1

## 2021-08-31 NOTE — Plan of Care (Signed)

## 2021-08-31 NOTE — Progress Notes (Signed)
Physical Therapy Treatment Patient Details Name: Jennifer Porter MRN: [id number] DOB: 05-01-1945 Today's Date: 08/31/2021   History of Present Illness Pt is a 76 y/o female who presented s/p mechanical fall after an altercation involving her dog and a possum outside. Imaging showed L distal radius fx and L femoral neck fx. On 6/9, pt underwent IM nailing of L LE and ORIF of L wrist. PMH: DM, GERD, gout, HLD, HTN, pancreatitis    PT Comments    Pt demonstrating increased tolerance to activity today. Requiring modA for transfers and able to ambulate 82f to door with L platform walker and ModA, utilized chair follow. Pt demonstrating increased ability to weight shift to advance RLE. Began wheelchair propulsion as option for pt for longer distances. Pt was able to propel 1039fwith 2 turns and moderate cueing for propulsion. AIR continues to be the most appropriate discharge disposition due to pts current level of required assistance, decreased functional activity tolerance, and pts good potential for return to PLOF.    Recommendations for follow up therapy are one component of a multi-disciplinary discharge planning process, led by the attending physician.  Recommendations may be updated based on patient status, additional functional criteria and insurance authorization.  Follow Up Recommendations  Acute inpatient rehab (3hours/day)     Assistance Recommended at Discharge Intermittent Supervision/Assistance  Patient can return home with the following A lot of help with walking and/or transfers;A lot of help with bathing/dressing/bathroom;Assistance with cooking/housework;Assist for transportation;Help with stairs or ramp for entrance   Equipment Recommendations  Other (comment) (platform attachement left)    Recommendations for Other Services       Precautions / Restrictions Precautions Precautions: Fall;Other (comment) Required Braces or Orthoses: Splint/Cast Splint/Cast: L  wrist Restrictions Weight Bearing Restrictions: Yes LUE Weight Bearing: Weight bear through elbow only LLE Weight Bearing: Weight bearing as tolerated     Mobility  Bed Mobility               General bed mobility comments: Pt in chair upon arrival    Transfers Overall transfer level: Needs assistance Equipment used: Left platform walker Transfers: Sit to/from Stand, Bed to chair/wheelchair/BSC Sit to Stand: Mod assist   Step pivot transfers: Mod assist       General transfer comment: ModA for power up from recliner and WC, VCs for hand and LLE placement. Step pivot from WC to recliner, able to take multiple steps backwards to recliner, VCs for walker use    Ambulation/Gait Ambulation/Gait assistance: Mod assist, +2 safety/equipment Gait Distance (Feet): 15 Feet Assistive device: Left platform walker Gait Pattern/deviations: Step-to pattern, Decreased step length - right, Decreased stance time - left, Shuffle, Decreased weight shift to left Gait velocity: decreased     General Gait Details: Required heavy cueing for sequencing and assistance for weight shift to offweight LLE. Decreased floor clearance when advancing RLE   StTheme park managerobility: Yes Wheelchair propulsion: Right upper extremity, Right lower extremity, Left lower extremity Wheelchair parts: Supervision/cueing Distance: 100 Wheelchair Assistance Details (indicate cue type and reason): cueing for attempts to utilize LLE to assist propulsion, pt with difficulty turning to L side  Modified Rankin (Stroke Patients Only)       Balance Overall balance assessment: Needs assistance Sitting-balance support: Single extremity supported, Feet supported Sitting balance-Leahy Scale: Fair     Standing balance support: Single extremity supported, During functional activity Standing balance-Leahy Scale:  Poor Standing balance comment: reliant on  RW for support                            Cognition Arousal/Alertness: Awake/alert Behavior During Therapy: WFL for tasks assessed/performed Overall Cognitive Status: Within Functional Limits for tasks assessed                                          Exercises      General Comments        Pertinent Vitals/Pain Pain Assessment Pain Assessment: Faces Faces Pain Scale: Hurts little more Pain Location: L hip Pain Descriptors / Indicators: Discomfort, Guarding, Grimacing Pain Intervention(s): Monitored during session    Home Living                          Prior Function            PT Goals (current goals can now be found in the care plan section) Acute Rehab PT Goals Patient Stated Goal: get home when I can PT Goal Formulation: With patient/family Time For Goal Achievement: 09/11/21 Potential to Achieve Goals: Good Progress towards PT goals: Progressing toward goals    Frequency    Min 5X/week      PT Plan Current plan remains appropriate    Co-evaluation              AM-PAC PT "6 Clicks" Mobility   Outcome Measure  Help needed turning from your back to your side while in a flat bed without using bedrails?: A Lot Help needed moving from lying on your back to sitting on the side of a flat bed without using bedrails?: A Lot Help needed moving to and from a bed to a chair (including a wheelchair)?: A Lot Help needed standing up from a chair using your arms (e.g., wheelchair or bedside chair)?: A Lot Help needed to walk in hospital room?: A Lot Help needed climbing 3-5 steps with a railing? : Total 6 Click Score: 11    End of Session Equipment Utilized During Treatment: Gait belt Activity Tolerance: Patient limited by pain Patient left: in chair;with call bell/phone within reach;with chair alarm set;with family/visitor present Nurse Communication: Mobility status PT Visit Diagnosis: Other abnormalities of gait  and mobility (R26.89);Difficulty in walking, not elsewhere classified (R26.2)     Time: 0511-0211 PT Time Calculation (min) (ACUTE ONLY): 42 min  Charges:  $Gait Training: 8-22 mins $Therapeutic Activity: 23-37 mins                     Mackie Pai, SPT Acute Rehabilitation Services  Office: [place] 08/31/2021, 1:39 PM

## 2021-08-31 NOTE — Progress Notes (Signed)
Mobility Specialist Progress Note:   08/31/21 1110  Mobility  Activity Transferred to/from [facility]  Level of Assistance Minimal assist, patient does 75% or more (+2 (safety))  Assistive Device Front wheel walker (platform)  Distance Ambulated (ft) 2 ft  Activity Response Tolerated well  $Mobility charge 1 Mobility   Pt with successful BM on BSC. Required minA+2 to power up to standing. Pivoted to chair with increased time. Pt left in chair. PT in for session.   Nelta Numbers Acute Rehab Secure Chat or Office Phone: [phone]

## 2021-08-31 NOTE — H&P (Shared)
Physical Medicine and Rehabilitation Admission H&P    Chief Complaint  Patient presents with   Functional deficits due to polytrauma.     HPI:  Jennifer Porter is a 76 year old female with history of T2DM, chronic pancreatitis, GERD, gout who fell off her porch while chasing an animal away from her dog; struck her head, left hand and left hip in the process and had to call for help. She was evaluated at [facility] 08/26/21 and found to have small focus of [place] left convexity and small parafalcine SDH, left IT hip fracture and left distal radius fracture. She was transferred to [facility] for management. NS evaluated patient and felt no follow up films needed unless she declines due to the size of bleed. She underwent ORIF left distal radius and IM nailing of left hip on 06/09 by Dr. Doreatha Martin. Post op to be WBAT on LLE, NWB left wrist but WBAT thru left elbow.    To continue Lovenox for DVT prophylaxis and to transition to Eliquis at discharge.  On Keppra for seizure prophylaxis till 06/16. ABLA stable and pain control improving. Cognitive evaluation done showing delay in recall as well as higher level cognitive tasks with SLUMS score 23/30. Therapy has been working with patient and she continues to be limited by pain, weight bearing restrictions, shuffling gait with difficulty advancing LLE, needs cues for sequencing as well as weakness, CIR recommended due to functional decline.    Review of Systems  Constitutional:  Negative for chills and fever.  HENT:  Negative for hearing loss.   Eyes:  Negative for blurred vision and double vision.  Respiratory:  Negative for cough and shortness of breath.   Cardiovascular:  Positive for leg swelling. Negative for chest pain.  Gastrointestinal:  Negative for abdominal pain.  Genitourinary:  Positive for frequency.  Musculoskeletal:  Positive for joint pain and myalgias.  Skin:  Negative for rash.  Neurological:  Positive for weakness and headaches. Negative for  dizziness.  Psychiatric/Behavioral:  The patient is nervous/anxious (about multiple issues including her husband.).     Past Medical History:  Diagnosis Date   Allergy    seasonal   Asthma    Chronic pancreatitis ([place])    Complication of anesthesia    Diabetes mellitus without complication ([place])    GERD (gastroesophageal reflux disease)    hiatal hernia   H/O: gout    Hyperlipidemia    Hypertension    Pancreatitis    PONV (postoperative nausea and vomiting)     Past Surgical History:  Procedure Laterality Date   ABDOMINAL HYSTERECTOMY     APPENDECTOMY     Arthroscopic Left Knee Left 03/22/2003   CHOLECYSTECTOMY     in [place], 2010, without polyps   ESOPHAGOGASTRODUODENOSCOPY (EGD) WITH PROPOFOL N/A 11/08/2018   Procedure: ESOPHAGOGASTRODUODENOSCOPY (EGD) WITH PROPOFOL;  Surgeon: [name], MD;  Location: [facility];  Service: Endoscopy;  Laterality: N/A;   EUS N/A 11/08/2018   Procedure: UPPER ENDOSCOPIC ULTRASOUND (EUS) RADIAL;  Surgeon: [name], MD;  Location: [facility];  Service: Endoscopy;  Laterality: N/A;   INTRAMEDULLARY (IM) NAIL INTERTROCHANTERIC Left 08/27/2021   Procedure: INTRAMEDULLARY (IM) NAIL INTERTROCHANTRIC;  Surgeon: Shona Needles, MD;  Location: [place];  Service: Orthopedics;  Laterality: Left;   ORIF WRIST FRACTURE Left 08/27/2021   Procedure: OPEN REDUCTION INTERNAL FIXATION (ORIF) WRIST FRACTURE;  Surgeon: Shona Needles, MD;  Location: [place];  Service: Orthopedics;  Laterality: Left;    Family History  Problem Relation Age of Onset   Stroke Mother    Diabetes Mother    Vision loss Mother        eye removed   Cancer Mother        cancer in eye - removed   Diabetes Brother    Heart disease Father    Colon cancer Sister    Uterine cancer Paternal Grandmother    Uterine cancer Maternal Aunt    Pancreatic cancer Maternal Aunt     Social History:  Married.  Retired Automotive engineer.  Husband 97 but reasonably active (on PD and works part time). She  reports that she has never smoked. She has never used smokeless tobacco. She reports that she does not drink alcohol and does not use drugs.   Allergies  Allergen Reactions   Codeine Nausea And Vomiting   Jardiance [Empagliflozin]    Macrobid [Nitrofurantoin] Hives   Simvastatin     headache    Medications Prior to Admission  Medication Sig Dispense Refill   aspirin EC 81 MG tablet Take 81 mg by mouth daily.     Cholecalciferol (VITAMIN D3) 125 MCG (5000 UT) TABS Take 5,000 Units by mouth daily.     Choline Fenofibrate (FENOFIBRIC ACID) 135 MG CPDR Take 1 capsule by mouth at bedtime. 90 capsule 2   CREON 36000-114000 units CPEP capsule TAKE TWO CAPSULES BY MOUTH WITH MEALS AND ONE WITH SNACKS (UP TO 8 A DAY) 240 capsule 11   dicyclomine (BENTYL) 10 MG capsule Take one capsule up to twice daily for abdominal cramping or loose stools. 60 capsule 3   estradiol (ESTRACE) 0.5 MG tablet TAKE ONE TABLET BY MOUTH DAILY 90 tablet 3   fluticasone (FLONASE) 50 MCG/ACT nasal spray Place 2 sprays into both nostrils daily as needed. (Patient taking differently: Place 2 sprays into both nostrils daily as needed for allergies.) 16 g 5   gabapentin (NEURONTIN) 300 MG capsule Take 1 capsule (300 mg total) by mouth at bedtime. 90 capsule 1   LEVEMIR FLEXTOUCH 100 UNIT/ML FlexTouch Pen INJECT 5 TO 50 UNITS INTO THE SKIN AT BEDTIME NIGHTLY (Patient taking differently: 5 Units at bedtime.) 15 mL 0   lisinopril (ZESTRIL) 20 MG tablet TAKE ONE (1) TABLET BY MOUTH EVERY DAY 90 tablet 3   Magnesium Oxide, Elemental, 400 MG TABS Take 1 tablet by mouth in the morning and at bedtime. (Patient taking differently: Take 1 tablet by mouth in the morning and at bedtime. Takes BID per pt) 180 tablet 3   metFORMIN (GLUCOPHAGE) 500 MG tablet Take 2 tablets (1,000 mg total) by mouth 2 (two) times daily with a meal. TAKE TWO (2) TABLETS BY MOUTH 2 TIMES DAILY WITH  A MEAL 360 tablet 3   pantoprazole (PROTONIX) 40 MG tablet Take one tablet once or twice daily, 30 minutes before a meal. 180 tablet 3   pravastatin (PRAVACHOL) 10 MG tablet TAKE ONE TABLET ('10MG'$  TOTAL) BY MOUTH ATBEDTIME (Patient taking differently: Take 10 mg by mouth at bedtime.) 90 tablet 0   sodium chloride (OCEAN) 0.65 % SOLN nasal spray Place 1 spray into both nostrils as needed for congestion. 88 mL 0   BD PEN NEEDLE NANO U/F 32G X 4 MM MISC USE UP TO TWICE DAILY 100 each 1      Home: Home Living Family/patient expects to be discharged to:: Private residence Living Arrangements: Spouse/significant other Available Help at Discharge:  Available 24 hours/day (spouse; daughter in [place]) Type of Home: House Home Access: Ramped entrance Home Layout: One level Bathroom Shower/Tub: Tub/shower unit, Multimedia programmer: Standard Bathroom Accessibility: Yes Home Equipment: Conservation officer, nature (2 wheels), BSC/3in1, Wheelchair - manual (access to [facility], [facility], [facility] and w/c)  Lives With: Spouse   Functional History: Prior Function Prior Level of Function : Independent/Modified Independent, Driving Mobility Comments: no  use of AD ADLs Comments: Independent with ADLs, IADLs, driving and grocery shopping. enjoys reading  Functional Status:  Mobility: Bed Mobility Overal bed mobility: Needs Assistance Bed Mobility: Sit to Supine Supine to sit: Max assist, +2 for physical assistance Sit to supine: Mod assist General bed mobility comments: assist for BLE back into bed Transfers Overall transfer level: Needs assistance Equipment used: Left platform walker Transfers: Sit to/from Stand, Bed to chair/wheelchair/BSC Sit to Stand: Max assist Bed to/from chair/wheelchair/BSC transfer type:: Step pivot Stand pivot transfers: Max assist Step pivot transfers: Mod assist, +2 physical assistance, +2 safety/equipment General transfer comment: pt with a lot of difficulty standing. with first trial, pt  achieved 80% standing but unable to transfer hand from armrest to walker. Pt wanted to trial pulling on walker with R UE though unable to even clear bottom with this method. Cued to attempt pushing from recliner again, shifting weight onto feet with husband guiding R hand from armrest to walker. Extended time and slow pacing needed to step to bed with L platform walker - assist needed to move RW each step pt took Ambulation/Gait Ambulation/Gait assistance: Mod assist, +2 safety/equipment Gait Distance (Feet): 8 Feet Assistive device: Left platform walker Gait Pattern/deviations: Step-to pattern, Decreased step length - right, Decreased stance time - left, Shuffle General Gait Details: Required heavy cueing for sequencing and assistance for weight shift to offweight LLE. Decreased floor clearance when advancing RLE Gait velocity: decreased Gait velocity interpretation: <1.31 ft/sec, indicative of household ambulator    ADL: ADL Overall ADL's : Needs assistance/impaired Eating/Feeding: Set up, Sitting Grooming: Minimal assistance, Sitting Upper Body Bathing: Moderate assistance, Sitting Lower Body Bathing: Maximal assistance, +2 for physical assistance, +2 for safety/equipment, Sitting/lateral leans, Sit to/from stand Upper Body Dressing : Moderate assistance, Sitting Lower Body Dressing: Maximal assistance, +2 for physical assistance, +2 for safety/equipment, Sit to/from stand, Sitting/lateral leans Lower Body Dressing Details (indicate cue type and reason): time spent problem solving sock mgmt seated in recliner for nonoperative LE though pt unable to bend to reach or bring this LE up to self. assist needed to manage underwear as well Toilet Transfer: Moderate assistance, +2 for safety/equipment, Stand-pivot Toilet Transfer Details (indicate cue type and reason): simulated to bed from [place] and Hygiene: Total assistance, +2 for physical assistance, +2 for  safety/equipment, Sitting/lateral lean, Sit to/from stand General ADL Comments: Limited by post op pain, limited use of L UE and significant weakness. Trialed L platform walker with pt unable to successfully stand with +1 assist  Cognition: Cognition Overall Cognitive Status: Impaired/Different from baseline Arousal/Alertness: Awake/alert Orientation Level: Oriented X4 Attention: Sustained Sustained Attention: Appears intact Memory: Impaired Memory Impairment: Retrieval deficit, Decreased recall of new information Awareness: Impaired Awareness Impairment: Emergent impairment Problem Solving: Impaired Problem Solving Impairment: Functional complex Executive Function: Self Correcting Self Correcting: Impaired Self Correcting Impairment: Functional complex Safety/Judgment: Appears intact Cognition Arousal/Alertness: Awake/alert Behavior During Therapy: WFL for tasks assessed/performed Overall Cognitive Status: Impaired/Different from baseline Area of Impairment: Problem solving, Awareness, Safety/judgement Safety/Judgement: Decreased awareness of deficits, Decreased awareness of safety Awareness: Intellectual Problem Solving: Slow  processing, Decreased initiation, Difficulty sequencing, Requires verbal cues, Requires tactile cues General Comments: needs cues for sequencing DME, anxious with mobility   Blood pressure 130/71, pulse 95, temperature 98 F (36.7 C), temperature source Oral, resp. rate 17, height '5\' 4"'$  (1.626 m), weight 55.7 kg, SpO2 99 %.   General: Alert and oriented x 3, No apparent distress. She is thin appears fatigued.  HEENT: Head is normocephalic, atraumatic, PERRLA, EOMI, sclera anicteric, oral mucosa pink and moist, dentition intact, ext ear canals clear,  Neck: Supple without JVD or lymphadenopathy Heart: Reg rate and rhythm. No murmurs rubs or gallops Chest: CTA bilaterally without wheezes, rales, or rhonchi; no distress Abdomen: Soft, non-tender,  non-distended, bowel sounds positive. Extremities: No clubbing, cyanosis, or edema. Pulses are 2+ Psych: Pt's affect is appropriate. Pt is cooperative Skin: Clean and intact without signs of breakdown Neuro:  Alert and oriented x4 Musculoskeletal: LUE spint, Dressing L thigh clean and dry 1-2+ effusion L knee. Unable to fully extend L knee.   Physical Exam Vitals and nursing note reviewed.  Constitutional:      Comments: Thin fatigued, anxious appearing female. Marc Morgans herself with a magazine.   Musculoskeletal:        General: Swelling present.     Comments: Foam dressing on left hip. Min edema left thigh and 1-2+ effusion left knee. Unable to fully extend knee and LLE limited by pain inhibition/anxiety. Left arm in splint and finger with min edema/resolving ecchymosis-->NV intact.   Neurological:     Mental Status: She is alert and oriented to person, place, and time.     Comments: Speech soft but clear. Internally distracted by frontal HA and anxiety. Slow and measured movements but she was able to follow simple motor commands without difficulty.      Results for orders placed or performed during the hospital encounter of 08/26/21 (from the past 48 hour(s))  Glucose, capillary     Status: Abnormal   Collection Time: 08/29/21 12:13 PM  Result Value Ref Range   Glucose-Capillary 189 (H) 70 - 99 mg/dL    Comment: Glucose reference range applies only to samples taken after fasting for at least 8 hours.  Glucose, capillary     Status: Abnormal   Collection Time: 08/29/21  4:32 PM  Result Value Ref Range   Glucose-Capillary 272 (H) 70 - 99 mg/dL    Comment: Glucose reference range applies only to samples taken after fasting for at least 8 hours.  Glucose, capillary     Status: Abnormal   Collection Time: 08/29/21 10:24 PM  Result Value Ref Range   Glucose-Capillary 109 (H) 70 - 99 mg/dL    Comment: Glucose reference range applies only to samples taken after fasting for at least 8  hours.  Glucose, capillary     Status: Abnormal   Collection Time: 08/30/21  8:19 AM  Result Value Ref Range   Glucose-Capillary 139 (H) 70 - 99 mg/dL    Comment: Glucose reference range applies only to samples taken after fasting for at least 8 hours.  Glucose, capillary     Status: Abnormal   Collection Time: 08/30/21 11:58 AM  Result Value Ref Range   Glucose-Capillary 237 (H) 70 - 99 mg/dL    Comment: Glucose reference range applies only to samples taken after fasting for at least 8 hours.  Glucose, capillary     Status: Abnormal   Collection Time: 08/30/21  4:06 PM  Result Value Ref Range   Glucose-Capillary 236 (H)  70 - 99 mg/dL    Comment: Glucose reference range applies only to samples taken after fasting for at least 8 hours.  Glucose, capillary     Status: Abnormal   Collection Time: 08/30/21  6:59 PM  Result Value Ref Range   Glucose-Capillary 267 (H) 70 - 99 mg/dL    Comment: Glucose reference range applies only to samples taken after fasting for at least 8 hours.   Comment 1 Notify RN    Comment 2 Document in Chart   Glucose, capillary     Status: Abnormal   Collection Time: 08/30/21 10:07 PM  Result Value Ref Range   Glucose-Capillary 157 (H) 70 - 99 mg/dL    Comment: Glucose reference range applies only to samples taken after fasting for at least 8 hours.  Glucose, capillary     Status: Abnormal   Collection Time: 08/31/21  9:06 AM  Result Value Ref Range   Glucose-Capillary 178 (H) 70 - 99 mg/dL    Comment: Glucose reference range applies only to samples taken after fasting for at least 8 hours.   No results found.    Blood pressure 130/71, pulse 95, temperature 98 F (36.7 C), temperature source Oral, resp. rate 17, height '5\' 4"'$  (1.626 m), weight 55.7 kg, SpO2 99 %.  Medical Problem List and Plan: 1. Functional deficits secondary to [facility] left convexity , small parafalcine SDH, left IT hip fracture and left distal radius fracture  -patient may shower, please  cover incisions  -ELOS/Goals: 09/01/2021 2.  Antithrombotics: -DVT/anticoagulation:  Pharmaceutical: Lovenox--Eliquis at d/c.  -antiplatelet therapy: N/A 3. Pain Management: Continue tylenol tid--decrease to 650 mg  -continue robaxin TID with oxycodone prn.  4. Mood/Sleep:  LCSW to follow for evaluation and support.   -antipsychotic agents: N/A 5. Neuropsych/cognition: This patient is capable of making decisions on his own behalf. 6. Skin/Wound Care: Routine pressure relief measures.  7. Fluids/Electrolytes/Nutrition: Monitor I/O. Check lytes in am.  8. Left distal radius Fx s/p ORIF: NWB left wrist but can WBAT thorough left elbow  --continue splint left forearm.  9. Left IT hip Fx s/p IM nailing: WBAT 10 ABLA: Monitor for stability. Recheck CBC in am.   -HGB 9.2 6/11 11. T2DM: Hgb A1c- 6.4. Will monitor BS ac/hs and use SSI for elevated BS/wound healing.   --Resume levemir 5 units at bedtime and monitor BS.  --Continue to hold metformin for now.  -Glucose with fair control 12. Small SDH/SAH: No follow up per NS. Monitor for any neurological changes.  13. Vitamin D insufficiency: 24.98-->add ergocalciferol for supplement.  14. Chronic pancreatitis: Followed by Dr. Gala Romney and recent flare 07/2021? --Abdominal pain/diarrhea managed w/creon and prn bentyl.  --will d/c colace as has been refusing and make miralax prn.  15. HTN: Monitor BP TID. Continue Lisinopril 20 mg/day.   -BP well controlled currently, follow trend 16. Seizure prophylaxis: Keppra bid thorough 06/16.  17. Endstage OA left knee: Followed by Dr. Wynelle Link injection Jan (trying to avoid surgery)  --OA noted on femur films but will get dedicated left knee Xray.  --Ice prn. Add Voltaren gel tid.   I have personally performed a face to face diagnostic evaluation of this patient and formulated the key components of the plan.  Additionally, I have personally reviewed laboratory data, imaging studies, as well as relevant  notes and concur with the physician assistant's documentation above.  The patient's status has not changed from the original H&P.  Any changes in documentation from the acute care  chart have been noted above.  Jennye Boroughs, MD, [facility]   Bary Leriche, PA-C 08/31/2021

## 2021-08-31 NOTE — Progress Notes (Signed)
Mobility Specialist Progress Note:   08/31/21 1050  Mobility  Activity Transferred to/from [facility]  Level of Assistance Minimal assist, patient does 75% or more (+2)  Assistive Device Front wheel walker (platform)  Distance Ambulated (ft) 2 ft  Activity Response Tolerated well  $Mobility charge 1 Mobility   Pt requesting to transfer to [facility]. Required minA+2 to power up from chair and pivot to [facility]. Followed WB precautions with no issues. Left on BSC for BM, will f/u to return to chair.   Nelta Numbers Acute Rehab Secure Chat or Office Phone: [phone]

## 2021-08-31 NOTE — Progress Notes (Signed)
Patient ID: Jennifer Porter, female   DOB: April 22, 1945, 76 y.o.   MRN: [id number] [facility] Surgery Progress Note  4 Days Post-Op  Subjective: CC-  Daughter at bedside. Currently getting up to bedside commode. Pain well controlled on current regimen. Tolerating diet, BM x2 yesterday.  Objective: Vital signs in last 24 hours: Temp:  [97.8 F (36.6 C)-99.1 F (37.3 C)] 98 F (36.7 C) (06/13 0909) Pulse Rate:  [84-96] 95 (06/13 0909) Resp:  [16-18] 17 (06/13 0909) BP: (124-131)/(60-71) 130/71 (06/13 0909) SpO2:  [96 %-99 %] 99 % (06/13 0909) Last BM Date : 08/30/21  Intake/Output from previous day: 06/12 0701 - 06/13 0700 In: 100 [IV Piggyback:100] Out: -  Intake/Output this shift: Total I/O In: 100 [IV Piggyback:100] Out: -   PE: Gen:  Alert, NAD, pleasant HEENT: EOM's intact, pupils equal and round Card: extremities well perfused Pulm:  effort normal on room air Abd: Soft, NT/ND Ext:  calves soft and nontender. Splint to LUE. CDI dressing to left thigh.  Psych: A&Ox4 Skin: no rashes noted, warm and dry  Lab Results:  Recent Labs    08/29/21 0106  WBC 8.7  HGB 9.2*  HCT 26.9*  PLT 205   BMET No results for input(s): "NA", "K", "CL", "CO2", "GLUCOSE", "BUN", "CREATININE", "CALCIUM" in the last 72 hours. PT/INR No results for input(s): "LABPROT", "INR" in the last 72 hours. CMP     Component Value Date/Time   NA 136 08/28/2021 0121   K 3.7 08/28/2021 0121   CL 107 08/28/2021 0121   CO2 23 08/28/2021 0121   GLUCOSE 177 (H) 08/28/2021 0121   BUN 13 08/28/2021 0121   CREATININE 0.80 08/28/2021 0121   CREATININE 0.70 03/29/2021 0906   CALCIUM 7.9 (L) 08/28/2021 0121   PROT 6.9 11/24/2020 0809   ALBUMIN 4.4 08/24/2018 1138   AST 20 11/24/2020 0809   ALT 15 11/24/2020 0809   ALKPHOS 17 (L) 08/24/2018 1138   BILITOT 0.5 11/24/2020 0809   GFRNONAA >60 08/28/2021 0121   GFRNONAA 88 07/20/2020 1042   GFRAA 102 07/20/2020 1042   Lipase     Component  Value Date/Time   LIPASE 72 (H) 08/24/2018 1138       Studies/Results: No results found.  Anti-infectives: Anti-infectives (From admission, onward)    Start     Dose/Rate Route Frequency Ordered Stop   08/27/21 1700  ceFAZolin (ANCEF) IVPB 2g/100 mL premix        2 g 200 mL/hr over 30 Minutes Intravenous Every 8 hours 08/27/21 1305 08/28/21 1002   08/27/21 1053  vancomycin (VANCOCIN) powder  Status:  Discontinued          As needed 08/27/21 1053 08/27/21 1122   08/27/21 0815  ceFAZolin (ANCEF) IVPB 2g/100 mL premix        2 g 200 mL/hr over 30 Minutes Intravenous On call to O.R. 08/27/21 0808 08/27/21 0905        Assessment/Plan Fall SAH/SDH - tiny on initial CT. Per NSGY, no need to repeat CT unless change in neuro exam. Keppra x7 days Left wrist fx - s/p ORIF 6/9 Dr. Doreatha Martin. WBAT thru left elbow and NWB thru the wrist Left intratrochanteric femur fx - s/p IMN 6/9 Dr. Doreatha Martin. WBAT LLE ABL anemia - Hgb 9.2 (6/11), stable HTN - home lisinopril HLD GERD - protonix Chronic pancreatitis - home creon DM - SSI   ID - ancef periop  VTE - SCDs, start LMWH 6/10. Plan to dc on  DOAC per ortho FEN - dc IVF, CM diet Foley - none   Dispo - Continue therapies. Awaiting CIR - medically stable when bed available.    I reviewed inpatient rehab notes, last 24 h vitals and pain scores, last 48 h intake and output.    LOS: 5 days    [facility] 08/31/2021, 9:14 AM Please see Amion for pager number during day hours 7:00am-4:30pm

## 2021-08-31 NOTE — PMR Pre-admission (Signed)
PMR Admission Coordinator Pre-Admission Assessment  Patient: Jennifer Porter is an 76 y.o., female MRN: [id number] DOB: 05-18-1945 Height: '5\' 4"'  (162.6 cm) Weight: 55.7 kg  Insurance Information HMO:     PPO:      PCP:      IPA:      80/20:      OTHER:  PRIMARY: U[employer] medicare      Policy#: [id number]      Subscriber: pt CM Name: Wilburn Cornelia      Phone#: [phone] ext [phone]     Fax#: [phone] Pre-Cert#: [id number] [place] # [id number] approved for 7 days      Employer:  Benefits:  Phone #: [phone]     Name: 6/12 Eff. Date: 07/19/21     Deduct: none      Out of Pocket Max: $4500      Life Max: none CIR: $325 co pay per day days 1 until 5      SNF: no co pay per day days 1 until 20; $196 co pay per day days 21 until 43, no copay days 44 until 100 Outpatient: $20 per visit     Co-Pay: visits per medical neccesity Home Health: 100%      Co-Pay: visits per medical neccesity DME: 80%     Co-Pay: 20% Providers: in network  SECONDARY: State BCBS of [place]      Policy#: [id number]  Financial Counselor:       Phone#:   The "Data Collection Information Summary" for patients in Inpatient Rehabilitation Facilities with attached "Privacy Act [place] Records" was provided and verbally reviewed with: Patient  Emergency Contact Information Contact Information     Name Relation Home Work Mobile   Luten,COLON Spouse [phone]  [phone]   Allegra Lai Daughter   [phone]      Current Medical History  Patient Admitting Diagnosis: polytrauma  History of Present Illness: 76 year old female transferred form [facility] to [facility] on 08/26/21 after suffering a ground level fall. She was on her deck when she tried to get her dog away form a possum an fell backwards. She struck her head, left hand and left hip. No LOC. Found to have a SAH/SDH, left wrist fracture and left intra trochanteric femur fracture.   Neurosurgery consulted and felt no need to repeat CT unless Neuro exam  changes for small SAH/SDH. Keppra for 7 days. Left wrist fracture s/p ORIF 6/9 per Dr Doreatha Martin. WBAT thru left elbow and NWB thru the wrist. Left intra trochanteric femur fracture s/p IM nailing Dr Doreatha Martin 6/9 and WBAT LLE. NWB left wrist but WBAT through left elbow.  Home Lisinopril for HTN. Protonix for GERD. Home Creon for chronic pancreatitis. SSI for DM.   Patient's medical record from [facility] has been reviewed by the rehabilitation admission coordinator and physician.  Past Medical History  Past Medical History:  Diagnosis Date   Allergy    seasonal   Asthma    Chronic pancreatitis ([place])    Complication of anesthesia    Diabetes mellitus without complication (HCC)    GERD (gastroesophageal reflux disease)    hiatal hernia   H/O: gout    Hyperlipidemia    Hypertension    Pancreatitis    PONV (postoperative nausea and vomiting)    Has the patient had major surgery during 100 days prior to admission? Yes  Family History   family history includes Cancer in her mother; Colon cancer in her sister; Diabetes in her brother and  mother; Heart disease in her father; Pancreatic cancer in her maternal aunt; Stroke in her mother; Uterine cancer in her maternal aunt and paternal grandmother; Vision loss in her mother.  Current Medications  Current Facility-Administered Medications:    acetaminophen (TYLENOL) tablet 1,000 mg, 1,000 mg, Oral, Q8H, McClung, Sarah A, PA-C, 1,000 mg at 09/01/21 0516   bisacodyl (DULCOLAX) suppository 10 mg, 10 mg, Rectal, Daily PRN, Corinne Ports, PA-C   dicyclomine (BENTYL) capsule 10 mg, 10 mg, Oral, BID PRN, Corinne Ports, PA-C   docusate sodium (COLACE) capsule 100 mg, 100 mg, Oral, BID, Corinne Ports, PA-C, 100 mg at 08/31/21 0834   enoxaparin (LOVENOX) injection 30 mg, 30 mg, Subcutaneous, Q12H, Meuth, Brooke A, PA-C, 30 mg at 09/01/21 4034   estradiol (ESTRACE) tablet 0.5 mg, 0.5 mg, Oral, Daily, Corinne Ports, PA-C, 0.5 mg at  09/01/21 7425   feeding supplement (GLUCERNA SHAKE) (GLUCERNA SHAKE) liquid 237 mL, 237 mL, Oral, TID BM, Meuth, Brooke A, PA-C, 237 mL at 09/01/21 1131   fluticasone (FLONASE) 50 MCG/ACT nasal spray 2 spray, 2 spray, Each Nare, Daily PRN, Thereasa Solo, Sarah A, PA-C   gabapentin (NEURONTIN) capsule 300 mg, 300 mg, Oral, QHS, McClung, Sarah A, PA-C, 300 mg at 08/31/21 2212   hydrALAZINE (APRESOLINE) injection 10 mg, 10 mg, Intravenous, Q2H PRN, McClung, Sarah A, PA-C   insulin aspart (novoLOG) injection 0-15 Units, 0-15 Units, Subcutaneous, TID AC & HS, Haddix, Thomasene Lot, MD, 3 Units at 09/01/21 0851   levETIRAcetam (KEPPRA) tablet 500 mg, 500 mg, Oral, BID, Meuth, Brooke A, PA-C, 500 mg at 09/01/21 9563   lipase/protease/amylase (CREON) capsule 72,000 Units, 72,000 Units, Oral, TID WC, Meuth, Brooke A, PA-C, 72,000 Units at 09/01/21 1130   lisinopril (ZESTRIL) tablet 20 mg, 20 mg, Oral, Daily, Corinne Ports, PA-C, 20 mg at 09/01/21 0834   methocarbamol (ROBAXIN) tablet 500 mg, 500 mg, Oral, Q6H, 500 mg at 09/01/21 1130 **OR** methocarbamol (ROBAXIN) 500 mg in dextrose 5 % 50 mL IVPB, 500 mg, Intravenous, Q6H, McClung, Sarah A, PA-C   morphine (PF) 2 MG/ML injection 2 mg, 2 mg, Intravenous, Q2H PRN, McClung, Sarah A, PA-C, 2 mg at 08/27/21 1540   ondansetron (ZOFRAN-ODT) disintegrating tablet 4 mg, 4 mg, Oral, Q6H PRN **OR** ondansetron (ZOFRAN) injection 4 mg, 4 mg, Intravenous, Q6H PRN, McClung, Sarah A, PA-C   oxyCODONE (Oxy IR/ROXICODONE) immediate release tablet 5-10 mg, 5-10 mg, Oral, Q4H PRN, McClung, Sarah A, PA-C, 5 mg at 08/30/21 1030   pantoprazole (PROTONIX) EC tablet 40 mg, 40 mg, Oral, Daily, 40 mg at 09/01/21 0833 **OR** pantoprazole (PROTONIX) injection 40 mg, 40 mg, Intravenous, Daily, McClung, Sarah A, PA-C   polyethylene glycol (MIRALAX / GLYCOLAX) packet 17 g, 17 g, Oral, Daily, McClung, Sarah A, PA-C, 17 g at 08/30/21 1022   prochlorperazine (COMPAZINE) tablet 10 mg, 10 mg, Oral, Q6H  PRN **OR** prochlorperazine (COMPAZINE) injection 5-10 mg, 5-10 mg, Intravenous, Q6H PRN, McClung, Sarah A, PA-C   sodium chloride (OCEAN) 0.65 % nasal spray 1 spray, 1 spray, Each Nare, PRN, McClung, Sarah A, PA-C  Patients Current Diet:  Diet Order             Diet Carb Modified Fluid consistency: Thin; Room service appropriate? Yes  Diet effective now                  Precautions / Restrictions Precautions Precautions: Fall, Other (comment) Precaution Comments: LUE sling Restrictions Weight Bearing Restrictions: Yes LUE Weight  Bearing: Weight bear through elbow only LLE Weight Bearing: Weight bearing as tolerated   Has the patient had 2 or more falls or a fall with injury in the past year? Yes  Prior Activity Level Community (5-7x/wk): independent, assists her spouse with his CCPD care  Prior Functional Level Self Care: Did the patient need help bathing, dressing, using the toilet or eating? Independent  Indoor Mobility: Did the patient need assistance with walking from room to room (with or without device)? Independent  Stairs: Did the patient need assistance with internal or external stairs (with or without device)? Independent  Functional Cognition: Did the patient need help planning regular tasks such as shopping or remembering to take medications? Independent  Patient Information Are you of Hispanic, Latino/a,or Spanish origin?: A. No, not of Hispanic, Latino/a, or Spanish origin What is your race?: A. White Do you need or want an interpreter to communicate with a doctor or health care staff?: 0. No  Patient's Response To:  Health Literacy and Transportation Is the patient able to respond to health literacy and transportation needs?: Yes Health Literacy - How often do you need to have someone help you when you read instructions, pamphlets, or other written material from your doctor or pharmacy?: Never In the past 12 months, has lack of transportation kept you  from medical appointments or from getting medications?: No In the past 12 months, has lack of transportation kept you from meetings, work, or from getting things needed for daily living?: No  Development worker, international aid / [place] Devices/Equipment: None Home Equipment: Conservation officer, nature (2 wheels), BSC/3in1, Wheelchair - manual (access to [facility] and w/c)  Prior Device Use: Indicate devices/aids used by the patient prior to current illness, exacerbation or injury? None of the above  Current Functional Level Cognition  Arousal/Alertness: Awake/alert Overall Cognitive Status: Within Functional Limits for tasks assessed Orientation Level: Oriented X4 Safety/Judgement: Decreased awareness of deficits, Decreased awareness of safety General Comments: needs cues for sequencing DME, anxious with mobility Attention: Sustained Sustained Attention: Appears intact Memory: Impaired Memory Impairment: Retrieval deficit, Decreased recall of new information Awareness: Impaired Awareness Impairment: Emergent impairment Problem Solving: Impaired Problem Solving Impairment: Functional complex Executive Function: Self Correcting Self Correcting: Impaired Self Correcting Impairment: Functional complex Safety/Judgment: Appears intact    Extremity Assessment (includes Sensation/Coordination)  Upper Extremity Assessment: LUE deficits/detail LUE Deficits / Details: L UE casted mid hand to mid forearm. digits very swollen but pt able to minimially form fist. also noted in sling (no order present). encouraged removal of sling for hand/elbow and shoulder ROM to avoid stiffness (elbow/shoulder ROM WFL) LUE Coordination: decreased fine motor  Lower Extremity Assessment: Defer to PT evaluation LLE: Unable to fully assess due to pain    ADLs  Overall ADL's : Needs assistance/impaired Eating/Feeding: Set up, Sitting Grooming: Minimal assistance, Sitting Upper Body Bathing: Moderate assistance,  Sitting Lower Body Bathing: Maximal assistance, +2 for physical assistance, +2 for safety/equipment, Sitting/lateral leans, Sit to/from stand Upper Body Dressing : Moderate assistance, Sitting Lower Body Dressing: Maximal assistance, +2 for physical assistance, +2 for safety/equipment, Sit to/from stand, Sitting/lateral leans Lower Body Dressing Details (indicate cue type and reason): time spent problem solving sock mgmt seated in recliner for nonoperative LE though pt unable to bend to reach or bring this LE up to self. assist needed to manage underwear as well Toilet Transfer: Moderate assistance, +2 for safety/equipment, Stand-pivot Toilet Transfer Details (indicate cue type and reason): simulated to bed from [place]  and Hygiene: Total assistance, +2 for physical assistance, +2 for safety/equipment, Sitting/lateral lean, Sit to/from stand General ADL Comments: Limited by post op pain, limited use of L UE and significant weakness. Trialed L platform walker with pt unable to successfully stand with +1 assist    Mobility  Overal bed mobility: Needs Assistance Bed Mobility: Sit to Supine Supine to sit: Max assist, +2 for physical assistance Sit to supine: Mod assist General bed mobility comments: Pt in chair upon arrival    Transfers  Overall transfer level: Needs assistance Equipment used: Left platform walker Transfers: Sit to/from Stand, Bed to chair/wheelchair/BSC Sit to Stand: Mod assist Bed to/from chair/wheelchair/BSC transfer type:: Step pivot Stand pivot transfers: Max assist Step pivot transfers: Mod assist General transfer comment: ModA for power up from recliner and WC, VCs for hand and LLE placement. Step pivot from WC to recliner, able to take multiple steps backwards to recliner, VCs for walker use    Ambulation / Gait / Stairs / Wheelchair Mobility  Ambulation/Gait Ambulation/Gait assistance: Mod assist, +2 safety/equipment Gait Distance  (Feet): 15 Feet Assistive device: Left platform walker Gait Pattern/deviations: Step-to pattern, Decreased step length - right, Decreased stance time - left, Shuffle, Decreased weight shift to left General Gait Details: Required heavy cueing for sequencing and assistance for weight shift to offweight LLE. Decreased floor clearance when advancing RLE Gait velocity: decreased Gait velocity interpretation: <1.31 ft/sec, indicative of household Engineer, mining mobility: Yes Wheelchair propulsion: Right upper extremity, Right lower extremity, Left lower extremity Wheelchair parts: Supervision/cueing Distance: 100 Wheelchair Assistance Details (indicate cue type and reason): cueing for attempts to utilize LLE to assist propulsion, pt with difficulty turning to L side    Posture / Balance Dynamic Sitting Balance Sitting balance - Comments: reliant on RUE support Balance Overall balance assessment: Needs assistance Sitting-balance support: Single extremity supported, Feet supported Sitting balance-Leahy Scale: Fair Sitting balance - Comments: reliant on RUE support Standing balance support: Single extremity supported, During functional activity Standing balance-Leahy Scale: Poor Standing balance comment: reliant on RW for support    Special needs/care consideration    Previous Home Environment  Living Arrangements: Spouse/significant other  Lives With: Spouse Available Help at Discharge: Available 24 hours/day (spouse; daughter in [place]) Type of Home: House Home Layout: One level Home Access: Ramped entrance Bathroom Shower/Tub: Tub/shower unit, Multimedia programmer: Standard Bathroom Accessibility: Yes How Accessible: Accessible via walker [place]: No  Discharge Living Setting Plans for Discharge Living Setting: Patient's home, Lives with (comment) (spouse) Type of Home at Discharge: House Discharge Home Layout: One level Discharge Home  Access: [place] entrance Discharge Bathroom Shower/Tub: Tub/shower unit, Walk-in shower Discharge Bathroom Toilet: Standard Discharge Bathroom Accessibility: Yes How Accessible: Accessible via walker Does the patient have any problems obtaining your medications?: No  Social/Family/Support Systems Patient Roles: Spouse Contact Information: spouse, [place] Anticipated Caregiver: spouse Anticipated Caregiver's Contact Information: see contacts Ability/Limitations of Caregiver: spouse is on peritoneal dialysis Caregiver Availability: 24/7 Discharge Plan Discussed with Primary Caregiver: Yes Is Caregiver In Agreement with Plan?: Yes Does Caregiver/Family have Issues with Lodging/Transportation while Pt is in Rehab?: Yes  Spouse on CCPD nightly. He also distributes honey 2 days per week and drives. Spouse also refinishes furniture. That are very independent and active couple.  Goals Patient/Family Goal for Rehab: supervision to min asist with PT and OT, mod I to supervision with SLP Expected length of stay: ELOS 10 to 14 days Pt/Family Agrees to Admission and willing to participate:  Yes Program Orientation Provided & Reviewed with Pt/Caregiver Including Roles  & Responsibilities: Yes  Decrease burden of Care through IP rehab admission: n/a  Possible need for SNF placement upon discharge: not anticipated  Patient Condition: I have reviewed medical records from [facility], spoken with  patient and spouse. I met with patient at the bedside for inpatient rehabilitation assessment.  Patient will benefit from ongoing PT, OT, and SLP, can actively participate in 3 hours of therapy a day 5 days of the week, and can make measurable gains during the admission.  Patient will also benefit from the coordinated team approach during an Inpatient Acute Rehabilitation admission.  The patient will receive intensive therapy as well as Rehabilitation physician, nursing, social worker, and care management  interventions.  Due to bladder management, bowel management, safety, skin/wound care, disease management, medication administration, pain management, and patient education the patient requires 24 hour a day rehabilitation nursing.  The patient is currently mod assist overall with mobility and basic ADLs.  Discharge setting and therapy post discharge at home with home health is anticipated.  Patient has agreed to participate in the Acute Inpatient Rehabilitation Program and will admit today.  Preadmission Screen Completed By:  Cleatrice Burke, 09/01/2021 12:00 PM ______________________________________________________________________   Discussed status with Dr. Curlene Dolphin on 09/01/2021 at 1200 and received approval for admission today.  Admission Coordinator:  Cleatrice Burke, RN, time 1200 Date 09/01/2021   Assessment/Plan: Diagnosis: SAH/SDH, left wrist fracture and left intra trochanteric femur fracture Does the need for close, 24 hr/day Medical supervision in concert with the patient's rehab needs make it unreasonable for this patient to be served in a less intensive setting? Yes Co-Morbidities requiring supervision/potential complications: Asthma, Gout, HLD, HTN, Pancreatitis, Diabetes Mellitus Due to bladder management, bowel management, safety, skin/wound care, disease management, medication administration, pain management, and patient education, does the patient require 24 hr/day rehab nursing? Yes Does the patient require coordinated care of a physician, rehab nurse, PT, OT, and SLP to address physical and functional deficits in the context of the above medical diagnosis(es)? Yes Addressing deficits in the following areas: balance, endurance, locomotion, strength, transferring, bowel/bladder control, bathing, dressing, feeding, grooming, toileting, cognition, speech, language, swallowing, and psychosocial support Can the patient actively participate in an intensive therapy program  of at least 3 hrs of therapy 5 days a week? Yes The potential for patient to make measurable gains while on inpatient rehab is excellent Anticipated functional outcomes upon discharge from inpatient rehab: supervision and min assist PT, supervision and min assist OT, modified independent SLP Estimated rehab length of stay to reach the above functional goals is: 10-14  Anticipated discharge destination: Home 10. Overall Rehab/Functional Prognosis: excellent   MD Signature: Jennye Boroughs

## 2021-08-31 NOTE — Progress Notes (Signed)
Inpatient Rehabilitation Admissions Coordinator   I have insurance approval for CIR , but bed not available today. Hopeful for bed in next 24 to 48 hrs. I met with patient at bedside and she is aware.  Danne Baxter, RN, MSN Rehab Admissions Coordinator [phone] 08/31/2021 10:14 AM

## 2021-09-01 ENCOUNTER — Inpatient Hospital Stay (HOSPITAL_COMMUNITY): Payer: Medicare Other

## 2021-09-01 ENCOUNTER — Inpatient Hospital Stay (HOSPITAL_COMMUNITY)
Admission: RE | Admit: 2021-09-01 | Discharge: 2021-09-16 | DRG: 945 | Disposition: A | Discharge status: Home / Self Care | Payer: Medicare Other | Source: Intra-hospital | Attending: Physical Medicine & Rehabilitation | Admitting: Physical Medicine & Rehabilitation

## 2021-09-01 ENCOUNTER — Encounter (HOSPITAL_COMMUNITY): Payer: Self-pay | Admitting: Physical Medicine & Rehabilitation

## 2021-09-01 ENCOUNTER — Other Ambulatory Visit: Payer: Self-pay

## 2021-09-01 DIAGNOSIS — Z833 Family history of diabetes mellitus: Secondary | ICD-10-CM

## 2021-09-01 DIAGNOSIS — S52502D Unspecified fracture of the lower end of left radius, subsequent encounter for closed fracture with routine healing: Secondary | ICD-10-CM | POA: Diagnosis not present

## 2021-09-01 DIAGNOSIS — Z808 Family history of malignant neoplasm of other organs or systems: Secondary | ICD-10-CM

## 2021-09-01 DIAGNOSIS — Z821 Family history of blindness and visual loss: Secondary | ICD-10-CM | POA: Diagnosis not present

## 2021-09-01 DIAGNOSIS — M7989 Other specified soft tissue disorders: Secondary | ICD-10-CM | POA: Diagnosis not present

## 2021-09-01 DIAGNOSIS — K861 Other chronic pancreatitis: Secondary | ICD-10-CM | POA: Diagnosis present

## 2021-09-01 DIAGNOSIS — S728X2A Other fracture of left femur, initial encounter for closed fracture: Secondary | ICD-10-CM | POA: Diagnosis not present

## 2021-09-01 DIAGNOSIS — S72002D Fracture of unspecified part of neck of left femur, subsequent encounter for closed fracture with routine healing: Secondary | ICD-10-CM | POA: Diagnosis not present

## 2021-09-01 DIAGNOSIS — R197 Diarrhea, unspecified: Secondary | ICD-10-CM | POA: Diagnosis present

## 2021-09-01 DIAGNOSIS — W010XXA Fall on same level from slipping, tripping and stumbling without subsequent striking against object, initial encounter: Secondary | ICD-10-CM | POA: Diagnosis not present

## 2021-09-01 DIAGNOSIS — I609 Nontraumatic subarachnoid hemorrhage, unspecified: Secondary | ICD-10-CM

## 2021-09-01 DIAGNOSIS — Z90710 Acquired absence of both cervix and uterus: Secondary | ICD-10-CM | POA: Diagnosis not present

## 2021-09-01 DIAGNOSIS — I2510 Atherosclerotic heart disease of native coronary artery without angina pectoris: Secondary | ICD-10-CM | POA: Diagnosis present

## 2021-09-01 DIAGNOSIS — I10 Essential (primary) hypertension: Secondary | ICD-10-CM | POA: Diagnosis not present

## 2021-09-01 DIAGNOSIS — S066X0A Traumatic subarachnoid hemorrhage without loss of consciousness, initial encounter: Secondary | ICD-10-CM | POA: Diagnosis not present

## 2021-09-01 DIAGNOSIS — R040 Epistaxis: Secondary | ICD-10-CM | POA: Diagnosis present

## 2021-09-01 DIAGNOSIS — D649 Anemia, unspecified: Secondary | ICD-10-CM | POA: Diagnosis not present

## 2021-09-01 DIAGNOSIS — Z8249 Family history of ischemic heart disease and other diseases of the circulatory system: Secondary | ICD-10-CM | POA: Diagnosis not present

## 2021-09-01 DIAGNOSIS — T07XXXA Unspecified multiple injuries, initial encounter: Secondary | ICD-10-CM | POA: Diagnosis not present

## 2021-09-01 DIAGNOSIS — M62838 Other muscle spasm: Secondary | ICD-10-CM | POA: Diagnosis present

## 2021-09-01 DIAGNOSIS — S065XAD Traumatic subdural hemorrhage with loss of consciousness status unknown, subsequent encounter: Secondary | ICD-10-CM

## 2021-09-01 DIAGNOSIS — S066XAD Traumatic subarachnoid hemorrhage with loss of consciousness status unknown, subsequent encounter: Principal | ICD-10-CM

## 2021-09-01 DIAGNOSIS — Z7984 Long term (current) use of oral hypoglycemic drugs: Secondary | ICD-10-CM

## 2021-09-01 DIAGNOSIS — Z885 Allergy status to narcotic agent status: Secondary | ICD-10-CM

## 2021-09-01 DIAGNOSIS — Z9889 Other specified postprocedural states: Secondary | ICD-10-CM | POA: Diagnosis not present

## 2021-09-01 DIAGNOSIS — Z7982 Long term (current) use of aspirin: Secondary | ICD-10-CM

## 2021-09-01 DIAGNOSIS — E119 Type 2 diabetes mellitus without complications: Secondary | ICD-10-CM | POA: Diagnosis present

## 2021-09-01 DIAGNOSIS — M1712 Unilateral primary osteoarthritis, left knee: Secondary | ICD-10-CM | POA: Diagnosis not present

## 2021-09-01 DIAGNOSIS — S72142A Displaced intertrochanteric fracture of left femur, initial encounter for closed fracture: Secondary | ICD-10-CM | POA: Diagnosis not present

## 2021-09-01 DIAGNOSIS — M109 Gout, unspecified: Secondary | ICD-10-CM | POA: Diagnosis not present

## 2021-09-01 DIAGNOSIS — Z888 Allergy status to other drugs, medicaments and biological substances status: Secondary | ICD-10-CM | POA: Diagnosis not present

## 2021-09-01 DIAGNOSIS — L89151 Pressure ulcer of sacral region, stage 1: Secondary | ICD-10-CM | POA: Diagnosis not present

## 2021-09-01 DIAGNOSIS — Z8049 Family history of malignant neoplasm of other genital organs: Secondary | ICD-10-CM | POA: Diagnosis not present

## 2021-09-01 DIAGNOSIS — T1490XA Injury, unspecified, initial encounter: Secondary | ICD-10-CM | POA: Diagnosis not present

## 2021-09-01 DIAGNOSIS — S52592A Other fractures of lower end of left radius, initial encounter for closed fracture: Secondary | ICD-10-CM | POA: Diagnosis not present

## 2021-09-01 DIAGNOSIS — W1789XD Other fall from one level to another, subsequent encounter: Secondary | ICD-10-CM

## 2021-09-01 DIAGNOSIS — Z823 Family history of stroke: Secondary | ICD-10-CM

## 2021-09-01 DIAGNOSIS — Z23 Encounter for immunization: Secondary | ICD-10-CM | POA: Diagnosis not present

## 2021-09-01 DIAGNOSIS — Z9049 Acquired absence of other specified parts of digestive tract: Secondary | ICD-10-CM

## 2021-09-01 DIAGNOSIS — S065X0A Traumatic subdural hemorrhage without loss of consciousness, initial encounter: Secondary | ICD-10-CM | POA: Diagnosis not present

## 2021-09-01 DIAGNOSIS — Z741 Need for assistance with personal care: Secondary | ICD-10-CM | POA: Diagnosis present

## 2021-09-01 DIAGNOSIS — E1169 Type 2 diabetes mellitus with other specified complication: Secondary | ICD-10-CM | POA: Diagnosis present

## 2021-09-01 DIAGNOSIS — Z79899 Other long term (current) drug therapy: Secondary | ICD-10-CM

## 2021-09-01 DIAGNOSIS — L8990 Pressure ulcer of unspecified site, unspecified stage: Secondary | ICD-10-CM | POA: Insufficient documentation

## 2021-09-01 DIAGNOSIS — K219 Gastro-esophageal reflux disease without esophagitis: Secondary | ICD-10-CM | POA: Diagnosis not present

## 2021-09-01 DIAGNOSIS — D62 Acute posthemorrhagic anemia: Secondary | ICD-10-CM | POA: Diagnosis not present

## 2021-09-01 DIAGNOSIS — S065XAA Traumatic subdural hemorrhage with loss of consciousness status unknown, initial encounter: Secondary | ICD-10-CM | POA: Diagnosis not present

## 2021-09-01 DIAGNOSIS — Z800 Family history of malignant neoplasm of digestive organs: Secondary | ICD-10-CM | POA: Diagnosis not present

## 2021-09-01 DIAGNOSIS — R7989 Other specified abnormal findings of blood chemistry: Secondary | ICD-10-CM | POA: Diagnosis not present

## 2021-09-01 DIAGNOSIS — E785 Hyperlipidemia, unspecified: Secondary | ICD-10-CM | POA: Diagnosis present

## 2021-09-01 DIAGNOSIS — S72142S Displaced intertrochanteric fracture of left femur, sequela: Secondary | ICD-10-CM | POA: Diagnosis not present

## 2021-09-01 DIAGNOSIS — Z794 Long term (current) use of insulin: Secondary | ICD-10-CM

## 2021-09-01 DIAGNOSIS — M25559 Pain in unspecified hip: Secondary | ICD-10-CM | POA: Diagnosis not present

## 2021-09-01 LAB — GLUCOSE, CAPILLARY
Glucose-Capillary: 194 mg/dL — ABNORMAL HIGH (ref 70–99)
Glucose-Capillary: 194 mg/dL — ABNORMAL HIGH (ref 70–99)
Glucose-Capillary: 161 mg/dL — ABNORMAL HIGH (ref 70–99)
Glucose-Capillary: 195 mg/dL — ABNORMAL HIGH (ref 70–99)

## 2021-09-01 LAB — BASIC METABOLIC PANEL
Anion gap: 10 (ref 5–15)
BUN: 20 mg/dL (ref 8–23)
CO2: 26 mmol/L (ref 22–32)
Calcium: 8.9 mg/dL (ref 8.9–10.3)
Chloride: 104 mmol/L (ref 98–111)
Creatinine, Ser: 0.74 mg/dL (ref 0.44–1.00)
GFR, Estimated: >60 mL/min (ref >60)
Glucose, Bld: 156 mg/dL — ABNORMAL HIGH (ref 70–99)
Potassium: 3.7 mmol/L (ref 3.5–5.1)
Sodium: 140 mmol/L (ref 135–145)

## 2021-09-01 MED ORDER — LISINOPRIL 20 MG PO TABS
20.0000 mg | ORAL_TABLET | Freq: Every day | ORAL | Status: DC
  Administered 2021-09-02 – 2021-09-06 (×5): 20 mg via ORAL
  Filled 2021-09-01 (×5): qty 1

## 2021-09-01 MED ORDER — TRAMADOL HCL 50 MG PO TABS
50.0000 mg | ORAL_TABLET | Freq: Four times a day (QID) | ORAL | Status: DC | PRN
  Filled 2021-09-01: qty 1

## 2021-09-01 MED ORDER — MORPHINE SULFATE (PF) 2 MG/ML IV SOLN: 2.0000 mg | INTRAVENOUS | Status: DC | PRN

## 2021-09-01 MED ORDER — ONDANSETRON 4 MG PO TBDP: 4.0000 mg | ORAL_TABLET | Freq: Four times a day (QID) | ORAL | Status: DC | PRN

## 2021-09-01 MED ORDER — MAGNESIUM OXIDE -MG SUPPLEMENT 400 (240 MG) MG PO TABS
400.0000 mg | ORAL_TABLET | Freq: Two times a day (BID) | ORAL | Status: DC
  Administered 2021-09-01 – 2021-09-08 (×14): 400 mg via ORAL
  Filled 2021-09-01 (×14): qty 1

## 2021-09-01 MED ORDER — INSULIN ASPART 100 UNIT/ML IJ SOLN
0.0000 [IU] | Freq: Every day | INTRAMUSCULAR | Status: DC
  Administered 2021-09-02 – 2021-09-07 (×3): 2 [IU] via SUBCUTANEOUS

## 2021-09-01 MED ORDER — ENSURE MAX PROTEIN PO LIQD
11.0000 [oz_av] | Freq: Two times a day (BID) | ORAL | Status: DC
  Administered 2021-09-01 – 2021-09-15 (×22): 11 [oz_av] via ORAL
  Filled 2021-09-01: qty 330

## 2021-09-01 MED ORDER — METHOCARBAMOL 500 MG PO TABS
500.0000 mg | ORAL_TABLET | Freq: Four times a day (QID) | ORAL | Status: DC
  Administered 2021-09-01 – 2021-09-02 (×3): 500 mg via ORAL
  Filled 2021-09-01 (×3): qty 1

## 2021-09-01 MED ORDER — ENOXAPARIN SODIUM 30 MG/0.3ML IJ SOSY
30.0000 mg | PREFILLED_SYRINGE | Freq: Two times a day (BID) | INTRAMUSCULAR | Status: DC
  Administered 2021-09-01 – 2021-09-12 (×23): 30 mg via SUBCUTANEOUS
  Filled 2021-09-01 (×23): qty 0.3

## 2021-09-01 MED ORDER — LEVETIRACETAM 500 MG PO TABS
500.0000 mg | ORAL_TABLET | Freq: Two times a day (BID) | ORAL | Status: AC
  Administered 2021-09-01 – 2021-09-03 (×4): 500 mg via ORAL
  Filled 2021-09-01 (×4): qty 1

## 2021-09-01 MED ORDER — PRAVASTATIN SODIUM 10 MG PO TABS: 10.0000 mg | ORAL_TABLET | Freq: Every day | ORAL | Status: DC

## 2021-09-01 MED ORDER — ALUM & MAG HYDROXIDE-SIMETH 200-200-20 MG/5ML PO SUSP: 30.0000 mL | ORAL | Status: DC | PRN

## 2021-09-01 MED ORDER — ONDANSETRON HCL 4 MG/2ML IJ SOLN: 4.0000 mg | Freq: Four times a day (QID) | INTRAMUSCULAR | Status: DC | PRN

## 2021-09-01 MED ORDER — BISACODYL 10 MG RE SUPP
10.0000 mg | Freq: Every day | RECTAL | Status: DC | PRN
  Administered 2021-09-05: 10 mg via RECTAL
  Filled 2021-09-01: qty 1

## 2021-09-01 MED ORDER — PROCHLORPERAZINE EDISYLATE 10 MG/2ML IJ SOLN: 5.0000 mg | Freq: Four times a day (QID) | INTRAMUSCULAR | Status: DC | PRN

## 2021-09-01 MED ORDER — GABAPENTIN 300 MG PO CAPS
300.0000 mg | ORAL_CAPSULE | Freq: Every day | ORAL | Status: DC
  Administered 2021-09-01 – 2021-09-15 (×15): 300 mg via ORAL
  Filled 2021-09-01 (×15): qty 1

## 2021-09-01 MED ORDER — DIPHENHYDRAMINE HCL 12.5 MG/5ML PO ELIX: 12.5000 mg | ORAL_SOLUTION | Freq: Four times a day (QID) | ORAL | Status: DC | PRN

## 2021-09-01 MED ORDER — INSULIN DETEMIR 100 UNIT/ML ~~LOC~~ SOLN
5.0000 [IU] | Freq: Every day | SUBCUTANEOUS | Status: DC
  Administered 2021-09-01 – 2021-09-09 (×9): 5 [IU] via SUBCUTANEOUS
  Filled 2021-09-01 (×10): qty 0.05

## 2021-09-01 MED ORDER — INSULIN DETEMIR 100 UNIT/ML FLEXPEN: 5.0000 [IU] | PEN_INJECTOR | Freq: Every day | SUBCUTANEOUS | Status: DC

## 2021-09-01 MED ORDER — DICYCLOMINE HCL 10 MG PO CAPS
10.0000 mg | ORAL_CAPSULE | Freq: Two times a day (BID) | ORAL | Status: DC | PRN
Start: 2021-09-01 — End: 2021-09-16

## 2021-09-01 MED ORDER — FLEET ENEMA 7-19 GM/118ML RE ENEM: 1.0000 | ENEMA | Freq: Once | RECTAL | Status: DC | PRN

## 2021-09-01 MED ORDER — OXYCODONE HCL 5 MG PO TABS
5.0000 mg | ORAL_TABLET | ORAL | Status: DC | PRN
  Administered 2021-09-02: 5 mg via ORAL
  Administered 2021-09-03: 10 mg via ORAL
  Administered 2021-09-03 – 2021-09-04 (×2): 5 mg via ORAL
  Administered 2021-09-04 (×2): 10 mg via ORAL
  Administered 2021-09-05 – 2021-09-06 (×3): 5 mg via ORAL
  Administered 2021-09-07: 10 mg via ORAL
  Administered 2021-09-08 – 2021-09-09 (×2): 5 mg via ORAL
  Filled 2021-09-01: qty 2
  Filled 2021-09-01 (×2): qty 1
  Filled 2021-09-01: qty 2
  Filled 2021-09-01 (×3): qty 1
  Filled 2021-09-01 (×4): qty 2
  Filled 2021-09-01: qty 1

## 2021-09-01 MED ORDER — GLUCERNA SHAKE PO LIQD
237.0000 mL | Freq: Three times a day (TID) | ORAL | Status: DC
  Administered 2021-09-01: 237 mL via ORAL

## 2021-09-01 MED ORDER — ACETAMINOPHEN 325 MG PO TABS
325.0000 mg | ORAL_TABLET | ORAL | Status: DC | PRN
  Administered 2021-09-08 – 2021-09-11 (×2): 650 mg via ORAL
  Filled 2021-09-01: qty 2

## 2021-09-01 MED ORDER — PANCRELIPASE (LIP-PROT-AMYL) 36000-114000 UNITS PO CPEP
72000.0000 [IU] | ORAL_CAPSULE | Freq: Three times a day (TID) | ORAL | Status: DC
  Administered 2021-09-01 – 2021-09-16 (×44): 72000 [IU] via ORAL
  Filled 2021-09-01 (×47): qty 2

## 2021-09-01 MED ORDER — ACETAMINOPHEN 325 MG PO TABS
650.0000 mg | ORAL_TABLET | Freq: Three times a day (TID) | ORAL | Status: DC
  Administered 2021-09-01 – 2021-09-16 (×44): 650 mg via ORAL
  Filled 2021-09-01 (×45): qty 2

## 2021-09-01 MED ORDER — PROCHLORPERAZINE 25 MG RE SUPP: 12.5000 mg | Freq: Four times a day (QID) | RECTAL | Status: DC | PRN

## 2021-09-01 MED ORDER — GUAIFENESIN-DM 100-10 MG/5ML PO SYRP: 5.0000 mL | ORAL_SOLUTION | Freq: Four times a day (QID) | ORAL | Status: DC | PRN

## 2021-09-01 MED ORDER — INSULIN GLARGINE-YFGN 100 UNIT/ML ~~LOC~~ SOLN
5.0000 [IU] | Freq: Every day | SUBCUTANEOUS | Status: DC
Start: 2021-09-01 — End: 2021-09-01
  Filled 2021-09-01: qty 0.05

## 2021-09-01 MED ORDER — SALINE SPRAY 0.65 % NA SOLN: 1.0000 | NASAL | Status: DC | PRN

## 2021-09-01 MED ORDER — FLUTICASONE PROPIONATE 50 MCG/ACT NA SUSP
2.0000 | Freq: Every day | NASAL | Status: DC | PRN
Start: 2021-09-01 — End: 2021-09-16

## 2021-09-01 MED ORDER — PANTOPRAZOLE SODIUM 40 MG PO TBEC
40.0000 mg | DELAYED_RELEASE_TABLET | Freq: Every day | ORAL | Status: DC
  Administered 2021-09-02 – 2021-09-16 (×15): 40 mg via ORAL
  Filled 2021-09-01 (×15): qty 1

## 2021-09-01 MED ORDER — TRAZODONE HCL 50 MG PO TABS: 25.0000 mg | ORAL_TABLET | Freq: Every evening | ORAL | Status: DC | PRN

## 2021-09-01 MED ORDER — PROCHLORPERAZINE MALEATE 5 MG PO TABS: 5.0000 mg | ORAL_TABLET | Freq: Four times a day (QID) | ORAL | Status: DC | PRN

## 2021-09-01 MED ORDER — DICLOFENAC SODIUM 1 % EX GEL
2.0000 g | Freq: Three times a day (TID) | CUTANEOUS | Status: DC
  Administered 2021-09-02 – 2021-09-16 (×35): 2 g via TOPICAL
  Filled 2021-09-01: qty 100

## 2021-09-01 MED ORDER — PANCRELIPASE (LIP-PROT-AMYL) 36000-114000 UNITS PO CPEP
72000.0000 [IU] | ORAL_CAPSULE | Freq: Three times a day (TID) | ORAL | Status: DC
  Filled 2021-09-01: qty 2

## 2021-09-01 MED ORDER — POLYETHYLENE GLYCOL 3350 17 G PO PACK: 17.0000 g | PACK | Freq: Every day | ORAL | Status: DC | PRN

## 2021-09-01 MED ORDER — ESTRADIOL 1 MG PO TABS
0.5000 mg | ORAL_TABLET | Freq: Every day | ORAL | Status: DC
  Administered 2021-09-02 – 2021-09-16 (×15): 0.5 mg via ORAL
  Filled 2021-09-01 (×15): qty 0.5

## 2021-09-01 MED ORDER — INSULIN ASPART 100 UNIT/ML IJ SOLN
0.0000 [IU] | Freq: Three times a day (TID) | INTRAMUSCULAR | Status: DC
  Administered 2021-09-01: 2 [IU] via SUBCUTANEOUS
  Administered 2021-09-02: 3 [IU] via SUBCUTANEOUS
  Administered 2021-09-02: 1 [IU] via SUBCUTANEOUS
  Administered 2021-09-02 – 2021-09-03 (×2): 3 [IU] via SUBCUTANEOUS
  Administered 2021-09-03: 2 [IU] via SUBCUTANEOUS
  Administered 2021-09-03: 1 [IU] via SUBCUTANEOUS
  Administered 2021-09-04 (×2): 2 [IU] via SUBCUTANEOUS
  Administered 2021-09-04 – 2021-09-05 (×2): 1 [IU] via SUBCUTANEOUS
  Administered 2021-09-05 (×2): 2 [IU] via SUBCUTANEOUS
  Administered 2021-09-06: 3 [IU] via SUBCUTANEOUS
  Administered 2021-09-06: 2 [IU] via SUBCUTANEOUS
  Administered 2021-09-07: 1 [IU] via SUBCUTANEOUS
  Administered 2021-09-07 (×2): 2 [IU] via SUBCUTANEOUS
  Administered 2021-09-08: 3 [IU] via SUBCUTANEOUS
  Administered 2021-09-08 – 2021-09-09 (×2): 1 [IU] via SUBCUTANEOUS
  Administered 2021-09-09: 2 [IU] via SUBCUTANEOUS
  Administered 2021-09-10 – 2021-09-13 (×3): 1 [IU] via SUBCUTANEOUS
  Administered 2021-09-14 (×2): 2 [IU] via SUBCUTANEOUS

## 2021-09-01 MED ORDER — INSULIN ASPART 100 UNIT/ML IJ SOLN: 0.0000 [IU] | Freq: Three times a day (TID) | INTRAMUSCULAR | Status: DC

## 2021-09-01 MED ORDER — JUVEN PO PACK
1.0000 | PACK | Freq: Two times a day (BID) | ORAL | Status: DC
  Administered 2021-09-02 – 2021-09-15 (×18): 1 via ORAL
  Filled 2021-09-01 (×15): qty 1

## 2021-09-01 NOTE — Progress Notes (Signed)
Patient discharged to CIR, transfer uneventful.  Patient's family at bedside.

## 2021-09-01 NOTE — Progress Notes (Signed)
Patient ID: Jennifer Porter, female   DOB: 22-Apr-1945, 76 y.o.   MRN: [id number] [facility] Surgery Progress Note  5 Days Post-Op  Subjective: CC-  Ready to get up for the day. Pain fairly well controlled. Hopeful for CIR today. Tolerating diet. BM yesterday.   Objective: Vital signs in last 24 hours: Temp:  [97.8 F (36.6 C)-98.3 F (36.8 C)] 98.3 F (36.8 C) (06/14 0804) Pulse Rate:  [87-92] 87 (06/14 0804) Resp:  [16-18] 16 (06/14 0804) BP: (135-138)/(59-82) 136/82 (06/14 0804) SpO2:  [96 %-98 %] 98 % (06/14 0804) Last BM Date : 08/31/21  Intake/Output from previous day: 06/13 0701 - 06/14 0700 In: 340 [P.O.:240; IV Piggyback:100] Out: -  Intake/Output this shift: No intake/output data recorded.  PE: Gen:  Alert, NAD, pleasant HEENT: EOM's intact, pupils equal and round Card: extremities well perfused Pulm:  effort normal on room air Abd: Soft, NT/ND Ext:  calves soft and nontender. Splint to LUE. CDI dressing to left thigh.  Psych: A&Ox4 Skin: no rashes noted, warm and dry   Lab Results:  No results for input(s): "WBC", "HGB", "HCT", "PLT" in the last 72 hours. BMET No results for input(s): "NA", "K", "CL", "CO2", "GLUCOSE", "BUN", "CREATININE", "CALCIUM" in the last 72 hours. PT/INR No results for input(s): "LABPROT", "INR" in the last 72 hours. CMP     Component Value Date/Time   NA 136 08/28/2021 0121   K 3.7 08/28/2021 0121   CL 107 08/28/2021 0121   CO2 23 08/28/2021 0121   GLUCOSE 177 (H) 08/28/2021 0121   BUN 13 08/28/2021 0121   CREATININE 0.80 08/28/2021 0121   CREATININE 0.70 03/29/2021 0906   CALCIUM 7.9 (L) 08/28/2021 0121   PROT 6.9 11/24/2020 0809   ALBUMIN 4.4 08/24/2018 1138   AST 20 11/24/2020 0809   ALT 15 11/24/2020 0809   ALKPHOS 17 (L) 08/24/2018 1138   BILITOT 0.5 11/24/2020 0809   GFRNONAA >60 08/28/2021 0121   GFRNONAA 88 07/20/2020 1042   GFRAA 102 07/20/2020 1042   Lipase     Component Value Date/Time   LIPASE 72  (H) 08/24/2018 1138       Studies/Results: No results found.  Anti-infectives: Anti-infectives (From admission, onward)    Start     Dose/Rate Route Frequency Ordered Stop   08/27/21 1700  ceFAZolin (ANCEF) IVPB 2g/100 mL premix        2 g 200 mL/hr over 30 Minutes Intravenous Every 8 hours 08/27/21 1305 08/28/21 1002   08/27/21 1053  vancomycin (VANCOCIN) powder  Status:  Discontinued          As needed 08/27/21 1053 08/27/21 1122   08/27/21 0815  ceFAZolin (ANCEF) IVPB 2g/100 mL premix        2 g 200 mL/hr over 30 Minutes Intravenous On call to O.R. 08/27/21 0808 08/27/21 0905        Assessment/Plan Fall SAH/SDH - tiny on initial CT. Per NSGY, no need to repeat CT unless change in neuro exam. Keppra x7 days Left wrist fx - s/p ORIF 6/9 Dr. Doreatha Martin. WBAT thru left elbow and NWB thru the wrist Left intratrochanteric femur fx - s/p IMN 6/9 Dr. Doreatha Martin. WBAT LLE ABL anemia - Hgb 9.2 (6/11), stable HTN - home lisinopril HLD GERD - protonix Chronic pancreatitis - home creon DM - SSI   ID - ancef periop  VTE - SCDs, start LMWH 6/10. Plan to dc on DOAC per ortho FEN - dc IVF, CM diet Foley -  none   Dispo - Continue therapies. Awaiting CIR - medically stable when bed available.    I reviewed inpatient rehab notes, last 24 h vitals and pain scores, last 48 h intake and output.    LOS: 6 days    [place] [facility] 09/01/2021, 10:29 AM Please see Amion for pager number during day hours 7:00am-4:30pm

## 2021-09-01 NOTE — Discharge Summary (Signed)
  Jennifer Porter   Patient ID: Jennifer Porter MRN: [id number] DOB/AGE: May 31, 1945 76 y.o.  Admit date: 08/26/2021 Discharge date: 09/01/2021   Discharge Diagnosis Fall SAH/SDH Left wrist fracture Left intratrochanteric femur fracture ABL anemia HTN  HLD GERD Chronic pancreatitis  DM  Consultants Neurosurgery Orthopedics  Imaging: No results found.  Procedures Dr. Doreatha Martin (08/27/2021)  CPT 27245-Cephalomedullary nailing of left intertrochanteric femur fracture CPT 25607-Open reduction internal fixation of left distal radius fracture  Hospital Course:  Jennifer Porter is a 76 y.o. female who was transferred from [facility] to [facility] for admission to the trauma service after suffering a ground level fall. States that she was on her deck last night around 2300 when she was trying to get her dog away from a possom and fell backwards. She struck her head, left hand, and left hip. No LOC. Yelled for help and her neighbor came and called EMS. In the ED she was found to have SAH/SDH, Left wrist fracture, and Left intratrochanteric femur fracture.  Patient was admitted to the trauma service. Neurosurgery was consulted for SAH/SDH and recommended no intervention, monitor neuro exam and only repeat CT head if change in neuro status, Keppra x7 days for seizure prophylaxis.  Orthopedics was consulted for wrist and femur fractures and took the patient to the OR 08/27/21 for the above listed injuries. Advised WBAT LLE and WBAT thru left elbow and NWB thru the wrist postoperatively. Patient worked with therapies during this admission who recommended inpatient rehab upon discharge. On 09/01/21 the patient was felt stable for discharge to CIR.  Patient will follow up as below and knows to call with questions or concerns.          Follow-up Information     Jennifer Porter, Jennifer Lot, MD. Call.   Specialty: Orthopedic Surgery Contact information: [place] [address] [phone]         Jennifer Squibb, MD. Call.   Specialty: Internal Medicine Why: Call for posthospitalization follow up appointment Contact information: [address] Quintella Reichert [facility] [address] [place], Jennifer Cornfield, NP Follow up.   Specialty: Neurosurgery Why: As needed Contact information: [place] [address] [phone]                  Signed: Wellington Porter, [facility] 09/01/2021, 11:51 AM Please see Amion for pager number during day hours 7:00am-4:30pm

## 2021-09-01 NOTE — Plan of Care (Addendum)

## 2021-09-01 NOTE — Progress Notes (Addendum)
Inpatient Rehabilitation Admissions Coordinator   CIR bed not yet available for her to admit. Acute team made aware.  Danne Baxter, RN, MSN Rehab Admissions Coordinator [phone] 09/01/2021 10:32 AM  CIR bed has become available to admit her today. I will notify Trauma service, acute team and make the arrangements to admit.  Danne Baxter, RN, MSN Rehab Admissions Coordinator [phone] 09/01/2021 11:48 AM

## 2021-09-01 NOTE — Progress Notes (Signed)
Jennifer Boroughs, MD  Physician Physical Medicine and Rehabilitation PMR Pre-admission     Signed Date of Service:  08/31/2021  8:39 AM  Related encounter: ED to Hosp-Admission (Current) from 08/26/2021 in [facility]       Show:Clear all '[x]' Written'[x]' Templated'[x]' Copied  Added by: '[x]' Cristina Gong, RN'[x]' Jennifer Boroughs, MD  '[]' Hover for details                                                                                                                                                                                                                                                                                                                                                                                                                                           PMR Admission Coordinator Pre-Admission Assessment   Patient: Jennifer Porter is an 76 y.o., female MRN: [id number] DOB: [birth year] Height: '5\' 4"'  (162.6 cm) Weight: 55.7 kg   Insurance Information HMO:     PPO:      PCP:      IPA:      80/20:      OTHER:  PRIMARY: UHC medicare      Policy#: [id number]      Subscriber: pt CM Name: Wilburn Cornelia      Phone#: [phone] ext [phone]     Fax#: [phone] Pre-Cert#: [id number] [place] # [id number] approved for 7 days  Employer:  Benefits:  Phone #: [phone]     Name: 6/12 Eff. Date: 07/19/21     Deduct: none      Out of Pocket Max: $4500      Life Max: none CIR: $325 co pay per day days 1 until 5      SNF: no co pay per day days 1 until 20; $196 co pay per day days 21 until 43, no copay days 44 until 100 Outpatient: $20 per visit     Co-Pay: visits per medical neccesity Home Health: 100%      Co-Pay: visits per medical neccesity DME: 80%     Co-Pay: 20% Providers: in network  SECONDARY: State BCBS of  [place]      Policy#: [id number]   Financial Counselor:       Phone#:    The "Data Collection Information Summary" for patients in Inpatient Rehabilitation Facilities with attached "Privacy Act [place] Records" was provided and verbally reviewed with: Patient   Emergency Contact Information Contact Information       Name Relation Home Work Mobile    Nemetz,COLON Spouse [phone]   [phone]    Allegra Lai Daughter     [phone]         Current Medical History  Patient Admitting Diagnosis: polytrauma   History of Present Illness: 76 year old female transferred [facility] to [facility] on 08/26/21 after suffering a ground level fall. She was on her deck when she tried to get her dog away form a possum an fell backwards. She struck her head, left hand and left hip. No LOC. Found to have a SAH/SDH, left wrist fracture and left intra trochanteric femur fracture.    Neurosurgery consulted and felt no need to repeat CT unless Neuro exam changes for small SAH/SDH. Keppra for 7 days. Left wrist fracture s/p ORIF 6/9 per Dr Doreatha Martin. WBAT thru left elbow and NWB thru the wrist. Left intra trochanteric femur fracture s/p IM nailing Dr Doreatha Martin 6/9 and WBAT LLE. NWB left wrist but WBAT through left elbow.   Home Lisinopril for HTN. Protonix for GERD. Home Creon for chronic pancreatitis. SSI for DM.    Patient's medical record [facility] has been reviewed by the rehabilitation admission coordinator and physician.   Past Medical History      Past Medical History:  Diagnosis Date   Allergy      seasonal   Asthma     Chronic pancreatitis ([place])     Complication of anesthesia     Diabetes mellitus without complication (HCC)     GERD (gastroesophageal reflux disease)      hiatal hernia   H/O: gout     Hyperlipidemia     Hypertension     Pancreatitis     PONV (postoperative nausea and vomiting)      Has the patient had major surgery during 100 days prior  to admission? Yes   Family History   family history includes Cancer in her mother; Colon cancer in her sister; Diabetes in her brother and mother; Heart disease in her father; Pancreatic cancer in her maternal aunt; Stroke in her mother; Uterine cancer in her maternal aunt and paternal grandmother; Vision loss in her mother.   Current Medications   Current Facility-Administered Medications:    acetaminophen (TYLENOL) tablet 1,000 mg, 1,000 mg, Oral, Q8H, McClung, Sarah A, PA-C, 1,000 mg at 09/01/21 0516   bisacodyl (DULCOLAX) suppository 10 mg, 10 mg, Rectal, Daily PRN,  Corinne Ports, PA-C   dicyclomine (BENTYL) capsule 10 mg, 10 mg, Oral, BID PRN, Corinne Ports, PA-C   docusate sodium (COLACE) capsule 100 mg, 100 mg, Oral, BID, Corinne Ports, PA-C, 100 mg at 08/31/21 0834   enoxaparin (LOVENOX) injection 30 mg, 30 mg, Subcutaneous, Q12H, Meuth, Brooke A, PA-C, 30 mg at 09/01/21 9021   estradiol (ESTRACE) tablet 0.5 mg, 0.5 mg, Oral, Daily, Corinne Ports, PA-C, 0.5 mg at 09/01/21 1155   feeding supplement (GLUCERNA SHAKE) (GLUCERNA SHAKE) liquid 237 mL, 237 mL, Oral, TID BM, Meuth, Brooke A, PA-C, 237 mL at 09/01/21 1131   fluticasone (FLONASE) 50 MCG/ACT nasal spray 2 spray, 2 spray, Each Nare, Daily PRN, Thereasa Solo, Sarah A, PA-C   gabapentin (NEURONTIN) capsule 300 mg, 300 mg, Oral, QHS, McClung, Sarah A, PA-C, 300 mg at 08/31/21 2212   hydrALAZINE (APRESOLINE) injection 10 mg, 10 mg, Intravenous, Q2H PRN, McClung, Sarah A, PA-C   insulin aspart (novoLOG) injection 0-15 Units, 0-15 Units, Subcutaneous, TID AC & HS, Haddix, Thomasene Lot, MD, 3 Units at 09/01/21 0851   levETIRAcetam (KEPPRA) tablet 500 mg, 500 mg, Oral, BID, Meuth, Brooke A, PA-C, 500 mg at 09/01/21 2080   lipase/protease/amylase (CREON) capsule 72,000 Units, 72,000 Units, Oral, TID WC, Meuth, Brooke A, PA-C, 72,000 Units at 09/01/21 1130   lisinopril (ZESTRIL) tablet 20 mg, 20 mg, Oral, Daily, Corinne Ports, PA-C, 20 mg  at 09/01/21 0834   methocarbamol (ROBAXIN) tablet 500 mg, 500 mg, Oral, Q6H, 500 mg at 09/01/21 1130 **OR** methocarbamol (ROBAXIN) 500 mg in dextrose 5 % 50 mL IVPB, 500 mg, Intravenous, Q6H, McClung, Sarah A, PA-C   morphine (PF) 2 MG/ML injection 2 mg, 2 mg, Intravenous, Q2H PRN, McClung, Sarah A, PA-C, 2 mg at 08/27/21 1540   ondansetron (ZOFRAN-ODT) disintegrating tablet 4 mg, 4 mg, Oral, Q6H PRN **OR** ondansetron (ZOFRAN) injection 4 mg, 4 mg, Intravenous, Q6H PRN, McClung, Sarah A, PA-C   oxyCODONE (Oxy IR/ROXICODONE) immediate release tablet 5-10 mg, 5-10 mg, Oral, Q4H PRN, McClung, Sarah A, PA-C, 5 mg at 08/30/21 1030   pantoprazole (PROTONIX) EC tablet 40 mg, 40 mg, Oral, Daily, 40 mg at 09/01/21 0833 **OR** pantoprazole (PROTONIX) injection 40 mg, 40 mg, Intravenous, Daily, McClung, Sarah A, PA-C   polyethylene glycol (MIRALAX / GLYCOLAX) packet 17 g, 17 g, Oral, Daily, McClung, Sarah A, PA-C, 17 g at 08/30/21 1022   prochlorperazine (COMPAZINE) tablet 10 mg, 10 mg, Oral, Q6H PRN **OR** prochlorperazine (COMPAZINE) injection 5-10 mg, 5-10 mg, Intravenous, Q6H PRN, McClung, Sarah A, PA-C   sodium chloride (OCEAN) 0.65 % nasal spray 1 spray, 1 spray, Each Nare, PRN, McClung, Sarah A, PA-C   Patients Current Diet:  Diet Order                  Diet Carb Modified Fluid consistency: Thin; Room service appropriate? Yes  Diet effective now                       Precautions / Restrictions Precautions Precautions: Fall, Other (comment) Precaution Comments: LUE sling Restrictions Weight Bearing Restrictions: Yes LUE Weight Bearing: Weight bear through elbow only LLE Weight Bearing: Weight bearing as tolerated    Has the patient had 2 or more falls or a fall with injury in the past year? Yes   Prior Activity Level Community (5-7x/wk): independent, assists her spouse with his CCPD care   Prior Functional Level Self Care: Did the patient need  help bathing, dressing, using the  toilet or eating? Independent   Indoor Mobility: Did the patient need assistance with walking from room to room (with or without device)? Independent   Stairs: Did the patient need assistance with internal or external stairs (with or without device)? Independent   Functional Cognition: Did the patient need help planning regular tasks such as shopping or remembering to take medications? Independent   Patient Information Are you of Hispanic, Latino/a,or Spanish origin?: A. No, not of Hispanic, Latino/a, or Spanish origin What is your race?: A. White Do you need or want an interpreter to communicate with a doctor or health care staff?: 0. No   Patient's Response To:  Health Literacy and Transportation Is the patient able to respond to health literacy and transportation needs?: Yes Health Literacy - How often do you need to have someone help you when you read instructions, pamphlets, or other written material from your doctor or pharmacy?: Never In the past 12 months, has lack of transportation kept you from medical appointments or from getting medications?: No In the past 12 months, has lack of transportation kept you from meetings, work, or from getting things needed for daily living?: No   Development worker, international aid / Walker Devices/Equipment: None Home Equipment: Conservation officer, nature (2 wheels), BSC/3in1, Wheelchair - manual (access to [facility] and w/c)   Prior Device Use: Indicate devices/aids used by the patient prior to current illness, exacerbation or injury? None of the above   Current Functional Level Cognition   Arousal/Alertness: Awake/alert Overall Cognitive Status: Within Functional Limits for tasks assessed Orientation Level: Oriented X4 Safety/Judgement: Decreased awareness of deficits, Decreased awareness of safety General Comments: needs cues for sequencing DME, anxious with mobility Attention: Sustained Sustained Attention: Appears intact Memory: Impaired Memory  Impairment: Retrieval deficit, Decreased recall of new information Awareness: Impaired Awareness Impairment: Emergent impairment Problem Solving: Impaired Problem Solving Impairment: Functional complex Executive Function: Self Correcting Self Correcting: Impaired Self Correcting Impairment: Functional complex Safety/Judgment: Appears intact    Extremity Assessment (includes Sensation/Coordination)   Upper Extremity Assessment: LUE deficits/detail LUE Deficits / Details: L UE casted mid hand to mid forearm. digits very swollen but pt able to minimially form fist. also noted in sling (no order present). encouraged removal of sling for hand/elbow and shoulder ROM to avoid stiffness (elbow/shoulder ROM WFL) LUE Coordination: decreased fine motor  Lower Extremity Assessment: Defer to PT evaluation LLE: Unable to fully assess due to pain     ADLs   Overall ADL's : Needs assistance/impaired Eating/Feeding: Set up, Sitting Grooming: Minimal assistance, Sitting Upper Body Bathing: Moderate assistance, Sitting Lower Body Bathing: Maximal assistance, +2 for physical assistance, +2 for safety/equipment, Sitting/lateral leans, Sit to/from stand Upper Body Dressing : Moderate assistance, Sitting Lower Body Dressing: Maximal assistance, +2 for physical assistance, +2 for safety/equipment, Sit to/from stand, Sitting/lateral leans Lower Body Dressing Details (indicate cue type and reason): time spent problem solving sock mgmt seated in recliner for nonoperative LE though pt unable to bend to reach or bring this LE up to self. assist needed to manage underwear as well Toilet Transfer: Moderate assistance, +2 for safety/equipment, Stand-pivot Toilet Transfer Details (indicate cue type and reason): simulated to bed from [place] and Hygiene: Total assistance, +2 for physical assistance, +2 for safety/equipment, Sitting/lateral lean, Sit to/from stand General ADL Comments:  Limited by post op pain, limited use of L UE and significant weakness. Trialed L platform walker with pt unable to successfully stand with +1  assist     Mobility   Overal bed mobility: Needs Assistance Bed Mobility: Sit to Supine Supine to sit: Max assist, +2 for physical assistance Sit to supine: Mod assist General bed mobility comments: Pt in chair upon arrival     Transfers   Overall transfer level: Needs assistance Equipment used: Left platform walker Transfers: Sit to/from Stand, Bed to chair/wheelchair/BSC Sit to Stand: Mod assist Bed to/from chair/wheelchair/BSC transfer type:: Step pivot Stand pivot transfers: Max assist Step pivot transfers: Mod assist General transfer comment: ModA for power up from recliner and WC, VCs for hand and LLE placement. Step pivot from WC to recliner, able to take multiple steps backwards to recliner, VCs for walker use     Ambulation / Gait / Stairs / Wheelchair Mobility   Ambulation/Gait Ambulation/Gait assistance: Mod assist, +2 safety/equipment Gait Distance (Feet): 15 Feet Assistive device: Left platform walker Gait Pattern/deviations: Step-to pattern, Decreased step length - right, Decreased stance time - left, Shuffle, Decreased weight shift to left General Gait Details: Required heavy cueing for sequencing and assistance for weight shift to offweight LLE. Decreased floor clearance when advancing RLE Gait velocity: decreased Gait velocity interpretation: <1.31 ft/sec, indicative of household Engineer, mining mobility: Yes Wheelchair propulsion: Right upper extremity, Right lower extremity, Left lower extremity Wheelchair parts: Supervision/cueing Distance: 100 Wheelchair Assistance Details (indicate cue type and reason): cueing for attempts to utilize LLE to assist propulsion, pt with difficulty turning to L side     Posture / Balance Dynamic Sitting Balance Sitting balance - Comments: reliant on RUE  support Balance Overall balance assessment: Needs assistance Sitting-balance support: Single extremity supported, Feet supported Sitting balance-Leahy Scale: Fair Sitting balance - Comments: reliant on RUE support Standing balance support: Single extremity supported, During functional activity Standing balance-Leahy Scale: Poor Standing balance comment: reliant on RW for support     Special needs/care consideration      Previous Home Environment  Living Arrangements: Spouse/significant other  Lives With: Spouse Available Help at Discharge: Available 24 hours/day (spouse; daughter in [place]) Type of Home: House Home Layout: One level Home Access: Ramped entrance Bathroom Shower/Tub: Tub/shower unit, Multimedia programmer: Standard Bathroom Accessibility: Yes How Accessible: Accessible via walker [place]: No   Discharge Living Setting Plans for Discharge Living Setting: Patient's home, Lives with (comment) (spouse) Type of Home at Discharge: House Discharge Home Layout: One level Discharge Home Access: [place] entrance Discharge Bathroom Shower/Tub: Tub/shower unit, Walk-in shower Discharge Bathroom Toilet: Standard Discharge Bathroom Accessibility: Yes How Accessible: Accessible via walker Does the patient have any problems obtaining your medications?: No   Social/Family/Support Systems Patient Roles: Spouse Contact Information: spouse, [place] Anticipated Caregiver: spouse Anticipated Caregiver's Contact Information: see contacts Ability/Limitations of Caregiver: spouse is on peritoneal dialysis Caregiver Availability: 24/7 Discharge Plan Discussed with Primary Caregiver: Yes Is Caregiver In Agreement with Plan?: Yes Does Caregiver/Family have Issues with Lodging/Transportation while Pt is in Rehab?: Yes   Spouse on CCPD nightly. He also distributes honey 2 days per week and drives. Spouse also refinishes furniture. That are very independent and active  couple.   Goals Patient/Family Goal for Rehab: supervision to min asist with PT and OT, mod I to supervision with SLP Expected length of stay: ELOS 10 to 14 days Pt/Family Agrees to Admission and willing to participate: Yes Program Orientation Provided & Reviewed with Pt/Caregiver Including Roles  & Responsibilities: Yes   Decrease burden of Care through IP rehab admission: n/a   Possible need for  SNF placement upon discharge: not anticipated   Patient Condition: I have reviewed medical records from [facility], spoken with  patient and spouse. I met with patient at the bedside for inpatient rehabilitation assessment.  Patient will benefit from ongoing PT, OT, and SLP, can actively participate in 3 hours of therapy a day 5 days of the week, and can make measurable gains during the admission.  Patient will also benefit from the coordinated team approach during an Inpatient Acute Rehabilitation admission.  The patient will receive intensive therapy as well as Rehabilitation physician, nursing, social worker, and care management interventions.  Due to bladder management, bowel management, safety, skin/wound care, disease management, medication administration, pain management, and patient education the patient requires 24 hour a day rehabilitation nursing.  The patient is currently mod assist overall with mobility and basic ADLs.  Discharge setting and therapy post discharge at home with home health is anticipated.  Patient has agreed to participate in the Acute Inpatient Rehabilitation Program and will admit today.   Preadmission Screen Completed By:  Cleatrice Burke, 09/01/2021 12:00 PM ______________________________________________________________________   Discussed status with Dr. Curlene Dolphin on 09/01/2021 at 1200 and received approval for admission today.   Admission Coordinator:  Cleatrice Burke, RN, time 1200 Date 09/01/2021    Assessment/Plan: Diagnosis: SAH/SDH, left  wrist fracture and left intra trochanteric femur fracture Does the need for close, 24 hr/day Medical supervision in concert with the patient's rehab needs make it unreasonable for this patient to be served in a less intensive setting? Yes Co-Morbidities requiring supervision/potential complications: Asthma, Gout, HLD, HTN, Pancreatitis, Diabetes Mellitus Due to bladder management, bowel management, safety, skin/wound care, disease management, medication administration, pain management, and patient education, does the patient require 24 hr/day rehab nursing? Yes Does the patient require coordinated care of a physician, rehab nurse, PT, OT, and SLP to address physical and functional deficits in the context of the above medical diagnosis(es)? Yes Addressing deficits in the following areas: balance, endurance, locomotion, strength, transferring, bowel/bladder control, bathing, dressing, feeding, grooming, toileting, cognition, speech, language, swallowing, and psychosocial support Can the patient actively participate in an intensive therapy program of at least 3 hrs of therapy 5 days a week? Yes The potential for patient to make measurable gains while on inpatient rehab is excellent Anticipated functional outcomes upon discharge from inpatient rehab: supervision and min assist PT, supervision and min assist OT, modified independent SLP Estimated rehab length of stay to reach the above functional goals is: 10-14           Anticipated discharge destination: Home 10. Overall Rehab/Functional Prognosis: excellent     MD Signature: Jennifer Porter         Revision History                                     Note Details  Traci Sermon, MD File Time 09/01/2021  1:29 PM  Author Type Physician Status Signed  Last Editor Jennifer Boroughs, MD Service Physical Medicine and Rehabilitation

## 2021-09-01 NOTE — Progress Notes (Addendum)
Discharge order in place.  Report given to [facility], patient is going to 4W room11.  PIV left in place per RN request.

## 2021-09-01 NOTE — H&P (Signed)
Physical Medicine and Rehabilitation Admission H&P        Chief Complaint  Patient presents with   Functional deficits due to polytrauma.       HPI:  Jennifer Porter is a 76 year old female with history of T2DM, chronic pancreatitis, GERD, gout who fell off her porch while chasing an animal away from her dog; struck her head, left hand and left hip in the process and had to call for help. She was evaluated at [facility] 08/26/21 and found to have small focus of Oriental left convexity and small parafalcine SDH, left IT hip fracture and left distal radius fracture. She was transferred to [facility] for management. NS evaluated patient and felt no follow up films needed unless she declines due to the size of bleed. She underwent ORIF left distal radius and IM nailing of left hip on 06/09 by Dr. Doreatha Martin. Post op to be WBAT on LLE, NWB left wrist but WBAT thru left elbow.     To continue Lovenox for DVT prophylaxis and to transition to Eliquis at discharge.  On Keppra for seizure prophylaxis for 1 week till 06/16. ABLA stable and pain control improving. She has chronic pancreatitis and is on creon.  She reports having regular BMs. Cognitive evaluation done showing delay in recall as well as higher level cognitive tasks with SLUMS score 23/30. Therapy has been working with patient and she continues to be limited by pain, weight bearing restrictions, shuffling gait with difficulty advancing LLE, needs cues for sequencing as well as weakness, CIR recommended due to functional decline.       Review of Systems  Constitutional:  Negative for chills and fever.  HENT:  Negative for hearing loss.   Eyes:  Negative for blurred vision and double vision.  Respiratory:  Negative for cough and shortness of breath.   Cardiovascular:  Positive for leg swelling. Negative for chest pain.  Gastrointestinal:  Negative for abdominal pain. Denies constipation.  Genitourinary:  Positive for frequency.  Musculoskeletal:  Positive  for joint pain and myalgias.  Skin:  Negative for rash.  Neurological:  Positive for weakness and headaches. Negative for dizziness.  Psychiatric/Behavioral:  The patient is nervous/anxious (about multiple issues including her husband.).           Past Medical History:  Diagnosis Date   Allergy      seasonal   Asthma     Chronic pancreatitis ([place])     Complication of anesthesia     Diabetes mellitus without complication ([place])     GERD (gastroesophageal reflux disease)      hiatal hernia   H/O: gout     Hyperlipidemia     Hypertension     Pancreatitis     PONV (postoperative nausea and vomiting)             Past Surgical History:  Procedure Laterality Date   ABDOMINAL HYSTERECTOMY       APPENDECTOMY       Arthroscopic Left Knee Left 03/22/2003   CHOLECYSTECTOMY        in [place], 2010, without polyps   ESOPHAGOGASTRODUODENOSCOPY (EGD) WITH PROPOFOL N/A 11/08/2018    Procedure: ESOPHAGOGASTRODUODENOSCOPY (EGD) WITH PROPOFOL;  Surgeon: [name], MD;  Location: [facility];  Service: Endoscopy;  Laterality: N/A;   EUS N/A 11/08/2018    Procedure: UPPER ENDOSCOPIC ULTRASOUND (EUS) RADIAL;  Surgeon: [name], MD;  Location: [facility];  Service: Endoscopy;  Laterality: N/A;   INTRAMEDULLARY (IM) NAIL INTERTROCHANTERIC Left 08/27/2021    Procedure: INTRAMEDULLARY (IM) NAIL INTERTROCHANTRIC;  Surgeon: Shona Needles, MD;  Location: [place];  Service: Orthopedics;  Laterality: Left;   ORIF WRIST FRACTURE Left 08/27/2021    Procedure: OPEN REDUCTION INTERNAL FIXATION (ORIF) WRIST FRACTURE;  Surgeon: Shona Needles, MD;  Location: [place];  Service: Orthopedics;  Laterality: Left;           Family History  Problem Relation Age of Onset   Stroke Mother     Diabetes Mother     Vision loss Mother          eye removed   Cancer Mother          cancer in eye - removed   Diabetes Brother     Heart disease Father     Colon cancer Sister      Uterine cancer Paternal Grandmother     Uterine cancer Maternal Aunt     Pancreatic cancer Maternal Aunt        Social History:  Married.  Retired Automotive engineer. Husband 58 but reasonably active (on PD and works part time). She  reports that she has never smoked. She has never used smokeless tobacco. She reports that she does not drink alcohol and does not use drugs.          Allergies  Allergen Reactions   Codeine Nausea And Vomiting   Jardiance [Empagliflozin]     Macrobid [Nitrofurantoin] Hives   Simvastatin        headache            Medications Prior to Admission  Medication Sig Dispense Refill   aspirin EC 81 MG tablet Take 81 mg by mouth daily.       Cholecalciferol (VITAMIN D3) 125 MCG (5000 UT) TABS Take 5,000 Units by mouth daily.       Choline Fenofibrate (FENOFIBRIC ACID) 135 MG CPDR Take 1 capsule by mouth at bedtime. 90 capsule 2   CREON 36000-114000 units CPEP capsule TAKE TWO CAPSULES BY MOUTH WITH MEALS AND ONE WITH SNACKS (UP TO 8 A DAY) 240 capsule 11   dicyclomine (BENTYL) 10 MG capsule Take one capsule up to twice daily for abdominal cramping or loose stools. 60 capsule 3   estradiol (ESTRACE) 0.5 MG tablet TAKE ONE TABLET BY MOUTH DAILY 90 tablet 3   fluticasone (FLONASE) 50 MCG/ACT nasal spray Place 2 sprays into both nostrils daily as needed. (Patient taking differently: Place 2 sprays into both nostrils daily as needed for allergies.) 16 g 5   gabapentin (NEURONTIN) 300 MG capsule Take 1 capsule (300 mg total) by mouth at bedtime. 90 capsule 1   LEVEMIR FLEXTOUCH 100 UNIT/ML FlexTouch Pen INJECT 5 TO 50 UNITS INTO THE SKIN AT BEDTIME NIGHTLY (Patient taking differently: 5 Units at bedtime.) 15 mL 0   lisinopril (ZESTRIL) 20 MG tablet TAKE ONE (1) TABLET BY MOUTH EVERY DAY 90 tablet 3   Magnesium Oxide, Elemental, 400 MG TABS Take 1 tablet by mouth in the morning and at bedtime. (Patient taking differently: Take 1 tablet by mouth in the morning  and at bedtime. Takes BID per pt) 180 tablet 3   metFORMIN (GLUCOPHAGE) 500 MG tablet Take 2 tablets (1,000 mg total) by mouth 2 (two) times daily with a meal. TAKE TWO (2) TABLETS BY MOUTH 2 TIMES DAILY WITH A MEAL 360 tablet 3   pantoprazole (  PROTONIX) 40 MG tablet Take one tablet once or twice daily, 30 minutes before a meal. 180 tablet 3   pravastatin (PRAVACHOL) 10 MG tablet TAKE ONE TABLET ('10MG'$  TOTAL) BY MOUTH ATBEDTIME (Patient taking differently: Take 10 mg by mouth at bedtime.) 90 tablet 0   sodium chloride (OCEAN) 0.65 % SOLN nasal spray Place 1 spray into both nostrils as needed for congestion. 88 mL 0   BD PEN NEEDLE NANO U/F 32G X 4 MM MISC USE UP TO TWICE DAILY 100 each 1          Home: Home Living Family/patient expects to be discharged to:: Private residence Living Arrangements: Spouse/significant other Available Help at Discharge: Available 24 hours/day (spouse; daughter in [place]) Type of Home: House Home Access: Ramped entrance Home Layout: One level Bathroom Shower/Tub: Tub/shower unit, Multimedia programmer: Standard Bathroom Accessibility: Yes Home Equipment: Conservation officer, nature (2 wheels), BSC/3in1, Wheelchair - manual (access to [facility] and w/c)  Lives With: Spouse   Functional History: Prior Function Prior Level of Function : Independent/Modified Independent, Driving Mobility Comments: no  use of AD ADLs Comments: Independent with ADLs, IADLs, driving and grocery shopping. enjoys reading   Functional Status:  Mobility: Bed Mobility Overal bed mobility: Needs Assistance Bed Mobility: Sit to Supine Supine to sit: Max assist, +2 for physical assistance Sit to supine: Mod assist General bed mobility comments: assist for BLE back into bed Transfers Overall transfer level: Needs assistance Equipment used: Left platform walker Transfers: Sit to/from Stand, Bed to chair/wheelchair/BSC Sit to Stand: Max assist Bed to/from chair/wheelchair/BSC transfer type::  Step pivot Stand pivot transfers: Max assist Step pivot transfers: Mod assist, +2 physical assistance, +2 safety/equipment General transfer comment: pt with a lot of difficulty standing. with first trial, pt achieved 80% standing but unable to transfer hand from armrest to walker. Pt wanted to trial pulling on walker with R UE though unable to even clear bottom with this method. Cued to attempt pushing from recliner again, shifting weight onto feet with husband guiding R hand from armrest to walker. Extended time and slow pacing needed to step to bed with L platform walker - assist needed to move RW each step pt took Ambulation/Gait Ambulation/Gait assistance: Mod assist, +2 safety/equipment Gait Distance (Feet): 8 Feet Assistive device: Left platform walker Gait Pattern/deviations: Step-to pattern, Decreased step length - right, Decreased stance time - left, Shuffle General Gait Details: Required heavy cueing for sequencing and assistance for weight shift to offweight LLE. Decreased floor clearance when advancing RLE Gait velocity: decreased Gait velocity interpretation: <1.31 ft/sec, indicative of household ambulator   ADL: ADL Overall ADL's : Needs assistance/impaired Eating/Feeding: Set up, Sitting Grooming: Minimal assistance, Sitting Upper Body Bathing: Moderate assistance, Sitting Lower Body Bathing: Maximal assistance, +2 for physical assistance, +2 for safety/equipment, Sitting/lateral leans, Sit to/from stand Upper Body Dressing : Moderate assistance, Sitting Lower Body Dressing: Maximal assistance, +2 for physical assistance, +2 for safety/equipment, Sit to/from stand, Sitting/lateral leans Lower Body Dressing Details (indicate cue type and reason): time spent problem solving sock mgmt seated in recliner for nonoperative LE though pt unable to bend to reach or bring this LE up to self. assist needed to manage underwear as well Toilet Transfer: Moderate assistance, +2 for  safety/equipment, Stand-pivot Toilet Transfer Details (indicate cue type and reason): simulated to bed from [place] and Hygiene: Total assistance, +2 for physical assistance, +2 for safety/equipment, Sitting/lateral lean, Sit to/from stand General ADL Comments: Limited by post op pain, limited  use of L UE and significant weakness. Trialed L platform walker with pt unable to successfully stand with +1 assist   Cognition: Cognition Overall Cognitive Status: Impaired/Different from baseline Arousal/Alertness: Awake/alert Orientation Level: Oriented X4 Attention: Sustained Sustained Attention: Appears intact Memory: Impaired Memory Impairment: Retrieval deficit, Decreased recall of new information Awareness: Impaired Awareness Impairment: Emergent impairment Problem Solving: Impaired Problem Solving Impairment: Functional complex Executive Function: Self Correcting Self Correcting: Impaired Self Correcting Impairment: Functional complex Safety/Judgment: Appears intact Cognition Arousal/Alertness: Awake/alert Behavior During Therapy: WFL for tasks assessed/performed Overall Cognitive Status: Impaired/Different from baseline Area of Impairment: Problem solving, Awareness, Safety/judgement Safety/Judgement: Decreased awareness of deficits, Decreased awareness of safety Awareness: Intellectual Problem Solving: Slow processing, Decreased initiation, Difficulty sequencing, Requires verbal cues, Requires tactile cues General Comments: needs cues for sequencing DME, anxious with mobility     Blood pressure 130/71, pulse 95, temperature 98 F (36.7 C), temperature source Oral, resp. rate 17, height '5\' 4"'$  (1.626 m), weight 55.7 kg, SpO2 99 %.     General: Alert and oriented x 3, No apparent distress. She is thin appears fatigued.  HEENT: Head is normocephalic, healing scalp lac, PERRLA, EOMI, sclera anicteric, oral mucosa pink and moist, Neck: Supple without  JVD or lymphadenopathy Heart: Reg rate and rhythm. No murmurs rubs or gallops Chest: CTA bilaterally without wheezes, rales, or rhonchi; no distress Abdomen: Soft, non-tender, non-distended, bowel sounds positive. Extremities: No clubbing, cyanosis, or edema. Pulses are 2+ Psych: Pt's affect is appropriate. Pt is cooperative, pleasant Skin: Clean and intact without signs of breakdown Neuro:  Alert and oriented x4. Speech and language appear to be intact. Makes eye contact, CN 2-12 intact, sensation intact to LT in all 4 extremities, follow commands and answers questions, able to name objections, short term memory appears to be mildly impaired, no ataxia noted Musculoskeletal: LUE spint, Dressing on L thigh clean and dry 1-2+ effusion L knee. Unable to fully extend L knee.  Strength 5/5 in RUE and RLE.  Strength 5/5 L shoulder abduction, at least 4/5 in elbow flexion and extension, able to grip with fingers RLE hip flexion 3/5, knee extension 3/5 limited by pain, Ankle PF and DF 4/5  IV RUE       Lab Results Last 48 Hours        Results for orders placed or performed during the hospital encounter of 08/26/21 (from the past 48 hour(s))  Glucose, capillary     Status: Abnormal    Collection Time: 08/29/21 12:13 PM  Result Value Ref Range    Glucose-Capillary 189 (H) 70 - 99 mg/dL      Comment: Glucose reference range applies only to samples taken after fasting for at least 8 hours.  Glucose, capillary     Status: Abnormal    Collection Time: 08/29/21  4:32 PM  Result Value Ref Range    Glucose-Capillary 272 (H) 70 - 99 mg/dL      Comment: Glucose reference range applies only to samples taken after fasting for at least 8 hours.  Glucose, capillary     Status: Abnormal    Collection Time: 08/29/21 10:24 PM  Result Value Ref Range    Glucose-Capillary 109 (H) 70 - 99 mg/dL      Comment: Glucose reference range applies only to samples taken after fasting for at least 8 hours.  Glucose,  capillary     Status: Abnormal    Collection Time: 08/30/21  8:19 AM  Result Value Ref Range    Glucose-Capillary  139 (H) 70 - 99 mg/dL      Comment: Glucose reference range applies only to samples taken after fasting for at least 8 hours.  Glucose, capillary     Status: Abnormal    Collection Time: 08/30/21 11:58 AM  Result Value Ref Range    Glucose-Capillary 237 (H) 70 - 99 mg/dL      Comment: Glucose reference range applies only to samples taken after fasting for at least 8 hours.  Glucose, capillary     Status: Abnormal    Collection Time: 08/30/21  4:06 PM  Result Value Ref Range    Glucose-Capillary 236 (H) 70 - 99 mg/dL      Comment: Glucose reference range applies only to samples taken after fasting for at least 8 hours.  Glucose, capillary     Status: Abnormal    Collection Time: 08/30/21  6:59 PM  Result Value Ref Range    Glucose-Capillary 267 (H) 70 - 99 mg/dL      Comment: Glucose reference range applies only to samples taken after fasting for at least 8 hours.    Comment 1 Notify RN      Comment 2 Document in Chart    Glucose, capillary     Status: Abnormal    Collection Time: 08/30/21 10:07 PM  Result Value Ref Range    Glucose-Capillary 157 (H) 70 - 99 mg/dL      Comment: Glucose reference range applies only to samples taken after fasting for at least 8 hours.  Glucose, capillary     Status: Abnormal    Collection Time: 08/31/21  9:06 AM  Result Value Ref Range    Glucose-Capillary 178 (H) 70 - 99 mg/dL      Comment: Glucose reference range applies only to samples taken after fasting for at least 8 hours.      Imaging Results (Last 48 hours)  No results found.         Blood pressure 130/71, pulse 95, temperature 98 F (36.7 C), temperature source Oral, resp. rate 17, height '5\' 4"'$  (1.626 m), weight 55.7 kg, SpO2 99 %.   Medical Problem List and Plan: 1. Functional deficits secondary to polytrauma with SAH left convexity , small parafalcine SDH, left IT hip  fracture and left distal radius fracture             -patient may shower, please cover incisions             -ELOS/Goals: ELOS 10 to 14 days, supervision to min asist with PT and OT, mod I to supervision with SLP 2.  Antithrombotics: -DVT/anticoagulation:  Pharmaceutical: Lovenox--Eliquis at d/c.             -antiplatelet therapy: N/A 3. Pain Management: Continue tylenol tid--decrease to 650 mg, Gabapentin '300mg'$  HS             -continue robaxin TID with oxycodone prn.  4. Mood/Sleep:  LCSW to follow for evaluation and support.              -antipsychotic agents: N/A 5. Neuropsych/cognition: This patient is capable of making decisions on his own behalf. 6. Skin/Wound Care: Routine pressure relief measures.  7. Fluids/Electrolytes/Nutrition: Monitor I/O. Check lytes in am.  8. Left distal radius Fx s/p ORIF: NWB left wrist but can WBAT thorough left elbow             --continue splint left forearm.  9. Left IT hip Fx s/p IM nailing: WBAT 10 ABLA: Monitor  for stability. Recheck CBC in am.              -HGB 9.2 6/11 11. T2DM: Hgb A1c- 6.4. Will monitor BS ac/hs and use SSI for elevated BS/wound healing.              --Resume levemir 5 units at bedtime and monitor BS.  --Continue to hold metformin for now.  -Glucose with fair control 12. Small SDH/SAH: No follow up per NS. Monitor for any neurological changes.  13. Vitamin D insufficiency: 24.98-->add ergocalciferol for supplement.  14. Chronic pancreatitis: Followed by Dr. Gala Romney and recent flare 07/2021? --Abdominal pain/diarrhea managed w/creon and prn bentyl.  --will d/c colace as has been refusing and make miralax prn.  15. HTN: Monitor BP TID. Continue Lisinopril 20 mg/day.              -BP well controlled currently, follow trend 16. Seizure prophylaxis: Keppra bid thorough 06/16.  17. Endstage OA left knee: Followed by Dr. Wynelle Link injection Jan (trying to avoid surgery)             --OA noted on femur films but will get  dedicated left knee Xray.             --Ice prn. Add Voltaren gel tid.    I have personally performed a face to face diagnostic evaluation of this patient and formulated the key components of the plan.  Additionally, I have personally reviewed laboratory data, imaging studies, as well as relevant notes and concur with the physician assistant's documentation above.   The patient's status has not changed from the original H&P.  Any changes in documentation from the acute care chart have been noted above.   Jennye Boroughs, MD, Mellody Drown     Bary Leriche, PA-C 08/31/2021

## 2021-09-02 DIAGNOSIS — S72142S Displaced intertrochanteric fracture of left femur, sequela: Secondary | ICD-10-CM

## 2021-09-02 LAB — GLUCOSE, CAPILLARY
Glucose-Capillary: 127 mg/dL — ABNORMAL HIGH (ref 70–99)
Glucose-Capillary: 236 mg/dL — ABNORMAL HIGH (ref 70–99)
Glucose-Capillary: 224 mg/dL — ABNORMAL HIGH (ref 70–99)
Glucose-Capillary: 218 mg/dL — ABNORMAL HIGH (ref 70–99)

## 2021-09-02 LAB — CBC WITH DIFFERENTIAL/PLATELET
Abs Immature Granulocytes: 0.16 10*3/uL — ABNORMAL HIGH (ref 0.00–0.07)
Basophils Absolute: 0.1 10*3/uL (ref 0.0–0.1)
Basophils Relative: 1 %
Eosinophils Absolute: 0.3 10*3/uL (ref 0.0–0.5)
Eosinophils Relative: 4 %
HCT: 26.1 % — ABNORMAL LOW (ref 36.0–46.0)
Hemoglobin: 8.8 g/dL — ABNORMAL LOW (ref 12.0–15.0)
Immature Granulocytes: 3 %
Lymphocytes Relative: 32 %
Lymphs Abs: 2 10*3/uL (ref 0.7–4.0)
MCH: 31.3 pg (ref 26.0–34.0)
MCHC: 33.7 g/dL (ref 30.0–36.0)
MCV: 92.9 fL (ref 80.0–100.0)
Monocytes Absolute: 0.6 10*3/uL (ref 0.1–1.0)
Monocytes Relative: 10 %
Neutro Abs: 3 10*3/uL (ref 1.7–7.7)
Neutrophils Relative %: 50 %
Platelets: 277 10*3/uL (ref 150–400)
RBC: 2.81 MIL/uL — ABNORMAL LOW (ref 3.87–5.11)
RDW: 13.4 % (ref 11.5–15.5)
WBC: 6.1 10*3/uL (ref 4.0–10.5)
nRBC: 0 % (ref 0.0–0.2)

## 2021-09-02 LAB — COMPREHENSIVE METABOLIC PANEL
ALT: 34 U/L (ref 0–44)
AST: 68 U/L — ABNORMAL HIGH (ref 15–41)
Albumin: 2.6 g/dL — ABNORMAL LOW (ref 3.5–5.0)
Alkaline Phosphatase: 26 U/L — ABNORMAL LOW (ref 38–126)
Anion gap: 12 (ref 5–15)
BUN: 18 mg/dL (ref 8–23)
CO2: 25 mmol/L (ref 22–32)
Calcium: 8.9 mg/dL (ref 8.9–10.3)
Chloride: 104 mmol/L (ref 98–111)
Creatinine, Ser: 0.68 mg/dL (ref 0.44–1.00)
GFR, Estimated: >60 mL/min (ref >60)
Glucose, Bld: 128 mg/dL — ABNORMAL HIGH (ref 70–99)
Potassium: 3.5 mmol/L (ref 3.5–5.1)
Sodium: 141 mmol/L (ref 135–145)
Total Bilirubin: 0.7 mg/dL (ref 0.3–1.2)
Total Protein: 5.2 g/dL — ABNORMAL LOW (ref 6.5–8.1)

## 2021-09-02 LAB — MAGNESIUM: Magnesium: 1.1 mg/dL — ABNORMAL LOW (ref 1.7–2.4)

## 2021-09-02 MED ORDER — MAGNESIUM SULFATE 2 GM/50ML IV SOLN
2.0000 g | INTRAVENOUS | Status: AC
  Administered 2021-09-02 (×2): 2 g via INTRAVENOUS
  Filled 2021-09-02 (×2): qty 50

## 2021-09-02 MED ORDER — METHOCARBAMOL 500 MG PO TABS
500.0000 mg | ORAL_TABLET | Freq: Three times a day (TID) | ORAL | Status: DC
  Administered 2021-09-02 – 2021-09-16 (×55): 500 mg via ORAL
  Filled 2021-09-02 (×55): qty 1

## 2021-09-02 MED ORDER — FERROUS SULFATE 325 (65 FE) MG PO TABS
325.0000 mg | ORAL_TABLET | Freq: Every day | ORAL | Status: DC
  Administered 2021-09-02 – 2021-09-16 (×15): 325 mg via ORAL
  Filled 2021-09-02 (×15): qty 1

## 2021-09-02 MED ORDER — VITAMIN D (ERGOCALCIFEROL) 1.25 MG (50000 UNIT) PO CAPS
50000.0000 [IU] | ORAL_CAPSULE | ORAL | Status: DC
  Administered 2021-09-02 – 2021-09-16 (×3): 50000 [IU] via ORAL
  Filled 2021-09-02 (×4): qty 1

## 2021-09-02 NOTE — Progress Notes (Signed)
Inpatient Rehabilitation  Patient information reviewed and entered into eRehab system by Eyan Hagood M. Vergil Burby, M.A., CCC/SLP, PPS Coordinator.  Information including medical coding, functional ability and quality indicators will be reviewed and updated through discharge.    

## 2021-09-02 NOTE — Evaluation (Signed)
Physical Therapy Assessment and Plan  Patient Details  Name: Jennifer Porter MRN: [id number] Date of Birth: 08-Aug-1945  PT Diagnosis: Difficulty walking and Muscle weakness Rehab Potential: Good ELOS: 10-14 days   Today's Date: 09/02/2021 PT Individual Time: 32-1400 PT Individual Time Calculation (min): 41 min    Hospital Problem: Principal Problem:   Trauma Active Problems:   Type 2 diabetes mellitus without complication (HCC)   Subdural hematoma (HCC)   Subarachnoid hemorrhage (HCC)   Acute blood loss anemia   Past Medical History:  Past Medical History:  Diagnosis Date   Allergy    seasonal   Asthma    Chronic pancreatitis ([place])    Complication of anesthesia    Diabetes mellitus without complication ([place])    GERD (gastroesophageal reflux disease)    hiatal hernia   H/O: gout    Hyperlipidemia    Hypertension    Pancreatitis    PONV (postoperative nausea and vomiting)    Past Surgical History:  Past Surgical History:  Procedure Laterality Date   ABDOMINAL HYSTERECTOMY     APPENDECTOMY     Arthroscopic Left Knee Left 03/22/2003   CHOLECYSTECTOMY     in [place], 2010, without polyps   ESOPHAGOGASTRODUODENOSCOPY (EGD) WITH PROPOFOL N/A 11/08/2018   Procedure: ESOPHAGOGASTRODUODENOSCOPY (EGD) WITH PROPOFOL;  Surgeon: [name], MD;  Location: [facility];  Service: Endoscopy;  Laterality: N/A;   EUS N/A 11/08/2018   Procedure: UPPER ENDOSCOPIC ULTRASOUND (EUS) RADIAL;  Surgeon: [name], MD;  Location: [facility];  Service: Endoscopy;  Laterality: N/A;   INTRAMEDULLARY (IM) NAIL INTERTROCHANTERIC Left 08/27/2021   Procedure: INTRAMEDULLARY (IM) NAIL INTERTROCHANTRIC;  Surgeon: Shona Needles, MD;  Location: [place];  Service: Orthopedics;  Laterality: Left;   ORIF WRIST FRACTURE Left 08/27/2021   Procedure: OPEN REDUCTION INTERNAL FIXATION (ORIF) WRIST FRACTURE;  Surgeon: Shona Needles, MD;  Location: [place];  Service:  Orthopedics;  Laterality: Left;    Assessment & Plan Clinical Impression: Patient is a 76 year old female with history of T2DM, chronic pancreatitis, GERD, gout who fell off her porch while chasing an animal away from her dog; struck her head, left hand and left hip in the process and had to call for help. She was evaluated at [facility] 08/26/21 and found to have small focus of Sylvarena left convexity and small parafalcine SDH, left IT hip fracture and left distal radius fracture. She was transferred to [facility] for management. NS evaluated patient and felt no follow up films needed unless she declines due to the size of bleed. She underwent ORIF left distal radius and IM nailing of left hip on 06/09 by Dr. Doreatha Martin. Post op to be WBAT on LLE, NWB left wrist but WBAT thru left elbow.     To continue Lovenox for DVT prophylaxis and to transition to Eliquis at discharge.  On Keppra for seizure prophylaxis for 1 week till 06/16. ABLA stable and pain control improving. She has chronic pancreatitis and is on creon.  She reports having regular BMs. Cognitive evaluation done showing delay in recall as well as higher level cognitive tasks with SLUMS score 23/30. Therapy has been working with patient and she continues to be limited by pain, weight bearing restrictions, shuffling gait with difficulty advancing LLE, needs cues for sequencing as well as weakness Patient transferred to [facility] on 09/01/2021 .   Patient currently requires mod with mobility secondary to muscle weakness, decreased cardiorespiratoy endurance,  decreased problem solving and demonstrates behaviors consistent with [place], and decreased sitting balance, decreased standing balance, and decreased balance strategies.  Prior to hospitalization, patient was independent  with mobility and lived with Spouse in a House home.  Home access is  Ramped entrance.  Patient will benefit from skilled PT intervention to maximize safe functional mobility, minimize fall  risk, and decrease caregiver burden for planned discharge home with intermittent assist.  Anticipate patient will benefit from follow up [facility] at discharge.  PT - End of Session Activity Tolerance: Tolerates 30+ min activity with multiple rests Endurance Deficit: Yes Endurance Deficit Description: Rest breaks within BADL tasks PT Assessment Rehab Potential (ACUTE/IP ONLY): Good PT Patient demonstrates impairments in the following area(s): Balance;Behavior;Endurance;Motor;Pain;Safety PT Transfers Functional Problem(s): Bed Mobility;Bed to Chair;Car;Furniture PT Locomotion Functional Problem(s): Ambulation;Stairs PT Plan PT Intensity: Minimum of 1-2 x/day ,45 to 90 minutes PT Frequency: 5 out of 7 days PT Duration Estimated Length of Stay: 10-14 days PT Treatment/Interventions: Ambulation/gait training;Community reintegration;DME/adaptive equipment instruction;Neuromuscular re-education;Psychosocial support;Stair training;UE/LE Strength taining/ROM;Balance/vestibular training;Functional electrical stimulation;Discharge planning;Wheelchair propulsion/positioning;Pain management;Skin care/wound management;Therapeutic Activities;Cognitive remediation/compensation;Disease management/prevention;Visual/perceptual remediation/compensation;Splinting/orthotics;Therapeutic Exercise;Patient/family education;Functional mobility training;UE/LE Coordination activities PT Transfers Anticipated Outcome(s): Supervision PT Locomotion Anticipated Outcome(s): Supervision PT Recommendation Follow Up Recommendations: Home health PT Patient destination: Home Equipment Recommended: To be determined   PT Evaluation Precautions/Restrictions Precautions Precautions: Fall Precaution Comments: LUE wrapped Required Braces or Orthoses: Splint/Cast Splint/Cast: L wrist Restrictions Weight Bearing Restrictions: Yes LUE Weight Bearing: Non weight bearing (FWB proximal to wrist) LLE Weight Bearing: Weight bearing as  tolerated General Chart Reviewed: Yes Family/Caregiver Present: Yes Vital Signs Pain Pain Assessment Pain Score: 5  Pain Interference Pain Interference Pain Effect on Sleep: 2. Occasionally Pain Interference with Therapy Activities: 3. Frequently Pain Interference with Day-to-Day Activities: 3. 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 Vocation Requirements: primary one caring for home and husband Leisure: Hobbies-yes (Comment) (enjoys reading) Vision/Perception  Vision - History Ability to See in Adequate Light: 0 Adequate Perception Perception: Within Functional Limits Praxis Praxis: Intact  Cognition Overall Cognitive Status: Impaired/Different from baseline Arousal/Alertness: Awake/alert Orientation Level: Oriented X4 Attention: Sustained Sustained Attention: Appears intact Memory: Appears intact Memory Impairment: Retrieval deficit;Decreased recall of new information Awareness: Appears intact Awareness Impairment: Emergent impairment Problem Solving: Impaired Safety/Judgment: Appears intact [place] [employer] Scales of Cognitive Functioning: Purposeful/appropriate Sensation Sensation Light Touch: Appears Intact Hot/Cold: Appears Intact Proprioception: Appears Intact Coordination Gross Motor Movements are Fluid and Coordinated: No Fine Motor Movements are Fluid and Coordinated: Yes Coordination and Movement  Description: impaired d/t L hip/L wrist surgery/pain Finger Nose Finger Test: Intact Motor  Motor Motor: Within Functional Limits   Trunk/Postural Assessment  Cervical Assessment Cervical Assessment: Within Functional Limits Thoracic Assessment Thoracic Assessment: Within Functional Limits Lumbar Assessment Lumbar Assessment: Within Functional Limits Postural Control Postural Control: Within Functional Limits  Balance Balance Balance Assessed: Yes Static Sitting Balance Static Sitting - Balance Support: Feet supported;No upper extremity supported Static Sitting - Level of Assistance: 5: Stand by assistance Dynamic Sitting Balance Dynamic Sitting - Balance Support: No upper extremity supported;Feet supported Dynamic Sitting -  Level of Assistance: 5: Stand by assistance Dynamic Sitting - Balance Activities: Reaching for objects;Forward lean/weight shifting;Reaching across midline Sitting balance - Comments: able to sit without UE support Static Standing Balance Static Standing - Balance Support: Bilateral upper extremity supported Static Standing - Level of Assistance: 4: Min assist Dynamic Standing Balance Dynamic Standing - Balance Support: During functional activity;Bilateral upper extremity supported Dynamic Standing - Level of Assistance: 3: Mod assist Extremity Assessment  RUE Assessment RUE Assessment: Within Functional Limits Active Range of Motion (AROM) Comments: no deficits noted with RUE General Strength Comments: MMT 5/5 R shoulder flexion LUE Assessment LUE Assessment: Exceptions to [facility] Active Range of Motion (AROM) Comments: shoulder flexion WFL, wrist movement restricted d/t cast/surgery RLE Assessment RLE Assessment: Within Functional Limits LLE Assessment LLE Assessment: Exceptions to [facility] General Strength Comments: Not formally tested secondary to ORIF. Also very limited by pain. Grossly 3/5 per clinical observation.  Care Tool Care Tool Bed  Mobility Roll left and right activity   Roll left and right assist level: Minimal Assistance - Patient > 75%    Sit to lying activity   Sit to lying assist level: Moderate Assistance - Patient 50 - 74%    Lying to sitting on side of bed activity   Lying to sitting on side of bed assist level: the ability to move from lying on the back to sitting on the side of the bed with no back support.: Moderate Assistance - Patient 50 - 74%     Care Tool Transfers Sit to stand transfer   Sit to stand assist level: Moderate Assistance - Patient 50 - 74%    Chair/bed transfer   Chair/bed transfer assist level: Moderate Assistance - Patient 50 - 74%     Toilet transfer   Assist Level: 2 Psychologist, prison and probation services transfer assist level: Moderate Assistance - Patient 50 - 74%      Care Tool Locomotion Ambulation   Assist level: Moderate Assistance - Patient 50 - 74% Assistive device: Walker-platform Max distance: 20'  Walk 10 feet activity   Assist level: Moderate Assistance - Patient - 50 - 74% Assistive device: Walker-platform   Walk 50 feet with 2 turns activity Walk 50 feet with 2 turns activity did not occur: Safety/medical concerns      Walk 150 feet activity Walk 150 feet activity did not occur: Safety/medical concerns      Walk 10 feet on uneven surfaces activity Walk 10 feet on uneven surfaces activity did not occur: Safety/medical concerns      Stairs Stair activity did not occur: Safety/medical concerns        Walk up/down 1 step activity Walk up/down 1 step or curb (drop down) activity did not occur: Safety/medical concerns      Walk up/down 4 steps activity Walk up/down 4 steps activity did not occur: Safety/medical concerns      Walk up/down 12 steps activity Walk up/down 12 steps activity did not occur: Safety/medical concerns      Pick up small objects from floor Pick up small object from the floor (from standing position) activity did not occur:  Safety/medical concerns      Wheelchair Is the patient using a wheelchair?: No          Wheel 50 feet with 2 turns activity      Wheel 150 feet activity        Refer to Care Plan for [place]  WEEK 1 PT Short Term Goal 1 (Week 1): Pt will complete bed mobility with minA. PT Short Term Goal 2 (Week 1): Pt will complete bed to chair transfer with minA. PT Short Term Goal 3 (Week 1): Pt will ambulate x50' with minA and LRAD. PT Short Term Goal 4 (Week 1): Pt will initiate stair training.  Recommendations for other services: None   Skilled Therapeutic Intervention  Evaluation completed (see details above and below) with education on PT POC and goals and individual treatment initiated with focus on balance, transfers, ambulation, and car transfer. Pt received seated in WC and agrees to therapy. Reports 2/10 pain in L hip and thigh. PT provides rest breaks as needed to manage pain. WC transport to gym for time management. Pt performs sit to stand with L platform RW and modA, with cues for positioning, hand placement, sequencing, and body mechanics. Pt ambulates x20' with PFRW and modA, utilizing step-to, antalgic gait pattern and ambulating extremity slowly secondary to pain. Pt requires modA and cues for step sequencing and body mechanics. Pt completes car transfer with modA and cues for sequencing and manual assistance with L leg lifting in car. Pt is unable to attempt ramp navigation or stair training at this time secondary to pain. PT has extended discussion with pt regarding importance of pain control for functional recovery and discusses premedicating prior to therapy going forward. Pt is apprehensive to take strong pain medication but willing to try tomorrow. Pt left seated in WC with alarm intact and all needs within reach.   Mobility Bed Mobility Bed Mobility: Supine to Sit;Sit to Supine Rolling Right: Minimal Assistance - Patient > 75% Right Sidelying to  Sit: Moderate Assistance - Patient 50-74% Supine to Sit: Moderate Assistance - Patient 50-74% Sit to Supine: Moderate Assistance - Patient 50-74% Transfers Transfers: Sit to Stand;Stand to Sit;Stand Pivot Transfers Sit to Stand: Moderate Assistance - Patient 50-74% Stand to Sit: Moderate Assistance - Patient 50-74% Stand Pivot Transfers: Moderate Assistance - Patient 50 - 74% Stand Pivot Transfer Details: Verbal cues for sequencing;Verbal cues for gait pattern;Verbal cues for precautions/safety;Tactile cues for sequencing;Tactile cues for posture Transfer (Assistive device): Right platform walker Locomotion  Gait Ambulation: Yes Gait Assistance: Moderate Assistance - Patient 50-74% Gait Distance (Feet): 20 Feet Assistive device: Left platform walker Gait Assistance Details: Verbal cues for technique;Verbal cues for gait pattern;Tactile cues for sequencing;Tactile cues for posture Gait Gait: Yes Gait Pattern: Impaired Gait Pattern: Step-to pattern (Antalgic) Gait velocity: decreased Stairs / Additional Locomotion Stairs: No Wheelchair Mobility Wheelchair Mobility: No   Discharge Criteria: Patient will be discharged from PT if patient refuses treatment 3 consecutive times without medical reason, if treatment goals not met, if there is a change in medical status, if patient makes no progress towards goals or if patient is discharged from hospital.  The above assessment, treatment plan, treatment alternatives and goals were discussed and mutually agreed upon: by patient and by family  Breck Coons, PT, DPT 09/02/2021, 5:41 PM

## 2021-09-02 NOTE — Progress Notes (Signed)
Inpatient Rehabilitation Admission Medication Review by a Pharmacist  A complete drug regimen review was completed for this patient to identify any potential clinically significant medication issues.  High Risk Drug Classes Is patient taking? Indication by Medication  Antipsychotic Yes Compazine prn - nausea  Anticoagulant Yes Enoxaparin - VTE prophylaxis  Antibiotic No   Opioid Yes Prn Tramadol - moderate pain Prn Percocet - moderate to severe pain   Antiplatelet No   Hypoglycemics/insulin Yes SSI, Levemir - glucose control  Vasoactive Medication Yes Lisinopril - HTN  Chemotherapy No   Other Yes Keppra - seizure prophlyaxis x 7 days (ends 6/16 am) Acetaminophen - pain Methocarbamol - muscle spams Gabapentin - neuropathic pain Creon, prn Bentyl - chronic pancreatitis Pantoprazole - GI prophylaxis Estrace - hormone replacement MagOx, Iron, Vitamin D - supplements Diclofenac gel - left knee pain PRNs: Trazodone - sleep Miralax - laxative     Type of Medication Issue Identified Description of Issue Recommendation(s)  Drug Interaction(s) (clinically significant)     Duplicate Therapy     Allergy     No Medication Administration End Date     Incorrect Dose     Additional Drug Therapy Needed  Mag low 1.1. PTA MagOx 400 mg BID resumed.   Vit D level just below therapeutic. IV dose x 1 planned  Supplement added  Significant med changes from prior encounter (inform family/care partners about these prior to discharge). Off ASA 81, plan Eliquis at discharge.  Off Vitamin D 5000 units daily. Follow up antiplatelet/anticoagulation plan at discharge. Follow up discharge Vitamin D regimen.  Other  Off Pravastatin 10 mg qhs. AST mildly elevated.  Also off Metformin (has SSI and Levemir), fenofibrate and Vitamin D 5000 units daily. Consider resuming meds at discharge if clinically indicated.     Clinically significant medication issues were identified that warrant physician  communication and completion of prescribed/recommended actions by midnight of the next day:  Yes  Name of provider notified for issues identified: Reesa Chew, PA-C  Provider Method of Notification: secure chat  Pharmacist comments:   Dr. Joesph Fillers noted plan to change Methocarbamol from q6h > TID.  Keeping QID but adjusted time to achs.  Prm Morphine IV for breakthrough pain also discontinued.   Vitamin D just below therapeutic range > weekly dose added  Mag low 1.1, PO dose already added, total 4 gm IV to be given today  Time spent performing this drug regimen review (minutes):  [address]  Arty Baumgartner, [place] 09/02/2021 10:57 AM

## 2021-09-02 NOTE — Plan of Care (Signed)
  Problem: RH Balance Goal: LTG Patient will maintain dynamic standing with ADLs (OT) Description: LTG:  Patient will maintain dynamic standing balance with assist during activities of daily living (OT)  Outcome: Progressing Flowsheets (Taken 09/02/2021 1225) LTG: Pt will maintain dynamic standing balance during ADLs with: Supervision/Verbal cueing   Problem: Sit to Stand Goal: LTG:  Patient will perform sit to stand in prep for activites of daily living with assistance level (OT) Description: LTG:  Patient will perform sit to stand in prep for activites of daily living with assistance level (OT) Outcome: Progressing Flowsheets (Taken 09/02/2021 1225) LTG: PT will perform sit to stand in prep for activites of daily living with assistance level: Supervision/Verbal cueing   Problem: RH Bathing Goal: LTG Patient will bathe all body parts with assist levels (OT) Description: LTG: Patient will bathe all body parts with assist levels (OT) Outcome: Progressing Flowsheets (Taken 09/02/2021 1225) LTG: Pt will perform bathing with assistance level/cueing: Supervision/Verbal cueing LTG: Position pt will perform bathing: Shower   Problem: RH Dressing Goal: LTG Patient will perform upper body dressing (OT) Description: LTG Patient will perform upper body dressing with assist, with/without cues (OT). Outcome: Progressing Flowsheets (Taken 09/02/2021 1225) LTG: Pt will perform upper body dressing with assistance level of: Set up assist Goal: LTG Patient will perform lower body dressing w/assist (OT) Description: LTG: Patient will perform lower body dressing with assist, with/without cues in positioning using equipment (OT) Outcome: Progressing Flowsheets (Taken 09/02/2021 1225) LTG: Pt will perform lower body dressing with assistance level of: Supervision/Verbal cueing   Problem: RH Toileting Goal: LTG Patient will perform toileting task (3/3 steps) with assistance level (OT) Description: LTG:  Patient will perform toileting task (3/3 steps) with assistance level (OT)  Outcome: Progressing Flowsheets (Taken 09/02/2021 1225) LTG: Pt will perform toileting task (3/3 steps) with assistance level: Supervision/Verbal cueing   Problem: RH Toilet Transfers Goal: LTG Patient will perform toilet transfers w/assist (OT) Description: LTG: Patient will perform toilet transfers with assist, with/without cues using equipment (OT) Outcome: Progressing Flowsheets (Taken 09/02/2021 1225) LTG: Pt will perform toilet transfers with assistance level of: Supervision/Verbal cueing

## 2021-09-02 NOTE — Evaluation (Signed)
Speech Language Pathology Assessment and Plan  Patient Details  Name: Jennifer Porter MRN: [id number] Date of Birth: Aug 10, 1945  SLP Diagnosis: Cognitive Impairments  Rehab Potential: Excellent ELOS: 14-17 days    Today's Date: 09/02/2021 SLP Individual Time: 0830-0930 SLP Individual Time Calculation (min): 60 min   Hospital Problem: Principal Problem:   Trauma Active Problems:   Type 2 diabetes mellitus without complication (HCC)   Subdural hematoma (HCC)   Subarachnoid hemorrhage (HCC)   Acute blood loss anemia  Past Medical History:  Past Medical History:  Diagnosis Date   Allergy    seasonal   Asthma    Chronic pancreatitis (HCC)    Complication of anesthesia    Diabetes mellitus without complication (HCC)    GERD (gastroesophageal reflux disease)    hiatal hernia   H/O: gout    Hyperlipidemia    Hypertension    Pancreatitis    PONV (postoperative nausea and vomiting)    Past Surgical History:  Past Surgical History:  Procedure Laterality Date   ABDOMINAL HYSTERECTOMY     APPENDECTOMY     Arthroscopic Left Knee Left 03/22/2003   CHOLECYSTECTOMY     in 1990s   COLONOSCOPY     [place], 2010, without polyps   ESOPHAGOGASTRODUODENOSCOPY (EGD) WITH PROPOFOL N/A 11/08/2018   Procedure: ESOPHAGOGASTRODUODENOSCOPY (EGD) WITH PROPOFOL;  Surgeon: Rachael Fee, MD;  Location: [facility];  Service: Endoscopy;  Laterality: N/A;   EUS N/A 11/08/2018   Procedure: UPPER ENDOSCOPIC ULTRASOUND (EUS) RADIAL;  Surgeon: Rachael Fee, MD;  Location: [facility];  Service: Endoscopy;  Laterality: N/A;   INTRAMEDULLARY (IM) NAIL INTERTROCHANTERIC Left 08/27/2021   Procedure: INTRAMEDULLARY (IM) NAIL INTERTROCHANTRIC;  Surgeon: Roby Lofts, MD;  Location: [facility] OR;  Service: Orthopedics;  Laterality: Left;   ORIF WRIST FRACTURE Left 08/27/2021   Procedure: OPEN REDUCTION INTERNAL FIXATION (ORIF) WRIST FRACTURE;  Surgeon: Roby Lofts, MD;  Location: [facility] OR;  Service:  Orthopedics;  Laterality: Left;    Assessment / Plan / Recommendation Clinical Impression Patient is a 76 year old female with history of T2DM, chronic pancreatitis, GERD, and gout who fell off her porch while chasing an animal away from her dog; struck her head, left hand and left hip in the process and had to call for help. She was evaluated at [facility] 08/26/21 and found to have small focus of SAH left convexity and small parafalcine SDH, left IT hip fracture and left distal radius fracture. She was transferred to [facility] for management. She underwent ORIF left distal radius and IM nailing of left hip on 06/09 by Dr. Jena Gauss. Post op to be WBAT on LLE, NWB left wrist but WBAT thru left elbow.  Therapy evaluations completion with recommendations for CIR. Patient admitted 09/01/21.  Patient was administered a cognitive-linguistic evaluation. Patient's overall auditory comprehension and verbal expression appeared [facility] for all tasks assessed.  At home, patient is independent both physically and cognitively and assists her husband at home with his CCPD. Patient reports she feels she is at her cognitive baseline but also reports feeling "foggy" at times due to medications. SLP administered the Cognistat and patient scored WFL on all subtests and demonstrated appropriate recall of functional information. However, due to patient's high-level of independence at baseline, patient would benefit from skilled SLP intervention to address high-level problem solving, organization and recall prior to discharge. Patient verbalized understanding and agreement.    Skilled Therapeutic Interventions          Administered a  cognitive-linguistic evaluation, please see above for details.   SLP Assessment  Patient will need skilled Speech Lanaguage Pathology Services during CIR admission    Recommendations  Recommendations for Other Services: Neuropsych consult Patient destination: Home Follow up Recommendations:  (TBD) Equipment  Recommended: None recommended by SLP    SLP Frequency 1 to 3 out of 7 days   SLP Duration  SLP Intensity  SLP Treatment/Interventions 14-17 days  Minumum of 1-2 x/day, 30 to 90 minutes  Cognitive remediation/compensation;Internal/external aids;Cueing hierarchy;Environmental controls;Therapeutic Activities;Functional tasks;Patient/family education    Pain Pain Assessment Pain Scale: 0-10 Pain Score: 0-No pain (no report of pain upon entry but visible pain when transfering) Pain Type: Surgical pain Pain Location: Pelvis Pain Orientation: Left Pain Descriptors / Indicators: Aching;Sore;Discomfort;Numbness Pain Onset: With Activity Pain Intervention(s): Elevated extremity;Rest;Emotional support Multiple Pain Sites: No  Prior Functioning Type of Home: House  Lives With: Spouse Available Help at Discharge: Available 24 hours/day;Other (Comment) (lives with spouse and family nearby in [place]) Vocation: Engineer, 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, 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  SLP Evaluation Cognition Overall Cognitive Status: Impaired/Different from baseline Arousal/Alertness: Awake/alert Orientation Level: Oriented X4 Attention: Sustained Sustained Attention: Appears intact Memory: Appears intact Memory Impairment: Retrieval deficit;Decreased recall of new information Awareness: Appears intact Awareness Impairment: Emergent impairment Problem Solving: Impaired Problem Solving Impairment: Functional complex Executive Function: Self Correcting Self Correcting: Impaired Self Correcting Impairment: Functional complex Safety/Judgment: Appears intact [place] [employer] Scales of Cognitive Functioning: Purposeful/appropriate  Comprehension Auditory Comprehension Overall Auditory Comprehension: Appears within functional limits for tasks assessed Expression Expression Primary Mode of Expression: Verbal Verbal Expression Overall Verbal Expression: Appears within functional limits for tasks assessed Written  Expression Dominant Hand: Right Written Expression: Not tested Oral Motor Oral Motor/Sensory Function Overall Oral Motor/Sensory Function: Within functional limits Motor Speech Overall Motor Speech: Appears within functional limits for tasks assessed  Care Tool Care Tool Cognition Ability to hear (with hearing aid or hearing appliances if normally used Ability to hear (with hearing aid or hearing appliances if normally used): 0. Adequate - no difficulty in normal conservation, social interaction, listening to TV   Expression of Ideas and Wants Expression of Ideas and Wants: 4. Without difficulty (complex and basic) - expresses complex messages without difficulty and with speech that is clear and easy to understand   Understanding Verbal and Non-Verbal Content Understanding Verbal and Non-Verbal Content: 4. Understands (complex and basic) - clear comprehension without cues or repetitions  Memory/Recall Ability Memory/Recall Ability : That he or she is in a hospital/hospital unit;Current season    Short Term Goals: Week 1: SLP Short Term Goal 1 (Week 1): Patient will demonstrate complex problem solving for functional tasks with Mod I. SLP Short Term Goal 2 (Week 1): Patient will recall new, daily information with Mod I.  Refer to Care Plan for Long Term Goals  Recommendations for other services: None   Discharge Criteria: Patient will be discharged from SLP if patient refuses treatment 3 consecutive times without medical reason, if treatment goals not met, if there is a change in medical status, if patient makes no progress towards goals or if patient is discharged from hospital.  The above assessment, treatment plan, treatment alternatives and goals were discussed and mutually agreed upon: by patient  Cannon Quinton 09/02/2021, 12:10 PM

## 2021-09-02 NOTE — Progress Notes (Signed)
PROGRESS NOTE   Subjective/Complaints: Had a pretty good night. Trying not to take a lot of pain medication. Kept left arm up for comfort.   ROS: Patient denies fever, rash, sore throat, blurred vision, dizziness, nausea, vomiting, diarrhea, cough, shortness of breath or chest pain, back/neck pain, headache, or mood change.    Objective:   DG Knee Complete 4 Views Left  Result Date: 09/01/2021 CLINICAL DATA:  Knee pain, known hip fracture. EXAM: LEFT KNEE - COMPLETE 4+ VIEW COMPARISON:  None Available. FINDINGS: There is medial soft tissue swelling. The bones are osteopenic. No significant joint effusion. No acute fracture or dislocation. There is medial and patellofemoral compartment joint space narrowing with osteophyte formation compatible with degenerative change. IMPRESSION: 1. Medial soft tissue swelling. 2. No acute fracture or dislocation identified. 3. Moderate degenerative changes. Electronically Signed   By: Ronney Asters M.D.   On: 09/01/2021 19:45   Recent Labs    09/02/21 0519  WBC 6.1  HGB 8.8*  HCT 26.1*  PLT 277   Recent Labs    09/01/21 1739 09/02/21 0519  NA 140 141  K 3.7 3.5  CL 104 104  CO2 26 25  GLUCOSE 156* 128*  BUN 20 18  CREATININE 0.74 0.68  CALCIUM 8.9 8.9    Intake/Output Summary (Last 24 hours) at 09/02/2021 1043 Last data filed at 09/02/2021 0730 Gross per 24 hour  Intake 237 ml  Output 500 ml  Net -263 ml        Physical Exam: Vital Signs Blood pressure (!) 141/68, pulse 89, temperature 98.3 F (36.8 C), resp. rate 18, height '5\' 4"'$  (1.626 m), weight 59 kg, SpO2 94 %.  General: Alert and oriented x 3, No apparent distress HEENT: Head is normocephalic, atraumatic, PERRLA, EOMI, sclera anicteric, oral mucosa pink and moist, dentition intact, ext ear canals clear,  Neck: Supple without JVD or lymphadenopathy Heart: Reg rate and rhythm. No murmurs rubs or gallops Chest: CTA  bilaterally without wheezes, rales, or rhonchi; no distress Abdomen: Soft, non-tender, non-distended, bowel sounds positive. Extremities: No clubbing, cyanosis, or edema. Pulses are 2+ Psych: Pt's affect is appropriate. Pt is cooperative Skin: Clean and intact without signs of breakdown. Left hip wound cdi Neuro:  seems to be alert with reasonable insight and awareness. Functional memory. Normal language and speech. No CN findings. RUE 5/5. LUE 3-4/5 limited at forearm d/t fx. LLE 2/5 prox to 4-/5 distally Musculoskeletal: left hip and forearm pain.     Assessment/Plan: 1. Functional deficits which require 3+ hours per day of interdisciplinary therapy in a comprehensive inpatient rehab setting. Physiatrist is providing close team supervision and 24 hour management of active medical problems listed below. Physiatrist and rehab team continue to assess barriers to discharge/monitor patient progress toward functional and medical goals  Care Tool:  Bathing              Bathing assist       Upper Body Dressing/Undressing Upper body dressing        Upper body assist      Lower Body Dressing/Undressing Lower body dressing            Lower body  assist       Toileting Toileting    Toileting assist Assist for toileting: 2 Helpers     Transfers Chair/bed transfer  Transfers assist           Locomotion Ambulation   Ambulation assist              Walk 10 feet activity   Assist           Walk 50 feet activity   Assist           Walk 150 feet activity   Assist           Walk 10 feet on uneven surface  activity   Assist           Wheelchair     Assist               Wheelchair 50 feet with 2 turns activity    Assist            Wheelchair 150 feet activity     Assist          Blood pressure (!) 141/68, pulse 89, temperature 98.3 F (36.8 C), resp. rate 18, height '5\' 4"'$  (1.626 m), weight 59 kg,  SpO2 94 %. Medical Problem List and Plan: 1. Functional deficits secondary to polytrauma with SAH left convexity , small parafalcine SDH, left IT hip fracture and left distal radius fracture             -patient may shower, please cover incisions             -ELOS/Goals: 10 to 14 days, supervision to min asist with PT and OT, mod I to supervision with SLP  -Patient is beginning CIR therapies today including PT, OT, and SLP  2.  Antithrombotics: -DVT/anticoagulation:  Pharmaceutical: Lovenox--Eliquis at d/c.             -antiplatelet therapy: N/A 3. Pain Management: Continue tylenol tid--  650 mg, Gabapentin '300mg'$  HS             -continue robaxin TID with oxycodone prn.   -pt hesitant to use narcotics 4. Mood/Sleep:  LCSW to follow for evaluation and support.              -antipsychotic agents: N/A 5. Neuropsych/cognition: This patient is capable of making decisions on his own behalf. 6. Skin/Wound Care: Routine pressure relief measures.  7. Fluids/Electrolytes/Nutrition: encourage PO  -hypomagnesia 1.1: give 2 runs of IV Mg++   -begin oral mag  -add protein supp for low albumin 8. Left distal radius Fx s/p ORIF: NWB left wrist but can WBAT thorough left elbow             --continue splint left forearm.  9. Left IT hip Fx s/p IM nailing: WBAT 10 ABLA:                -HGB 9.2 6/11--holding at 8.8 6/15  -add Fe++ supp 11. T2DM: Hgb A1c- 6.4. Will monitor BS ac/hs and use SSI for elevated BS/wound healing.              --Resume levemir 5 units at bedtime and monitor BS.  --Continue to hold metformin for now.  -CBG (last 3)  Recent Labs    09/01/21 1729 09/01/21 2125 09/02/21 0611  GLUCAP 161* 195* 127*   6/15 reasonable control 12. Small SDH/SAH: No follow up per NS. Monitor for any neurological changes.  13. Vitamin D insufficiency: 24.98-->add ergocalciferol for  supplement.  14. Chronic pancreatitis: Followed by Dr. Gala Romney and recent flare 07/2021? --Abdominal pain/diarrhea  managed w/creon and prn bentyl.  --  miralax prn.  15. HTN: Monitor BP TID. Continue Lisinopril 20 mg/day.              -BP well controlled 6/15 16. Seizure prophylaxis: Keppra bid thorough 06/16.  17. Endstage OA left knee: Followed by Dr. Wynelle Link injection Jan (trying to avoid surgery)             --OA moderate on xray yesterday             --Ice prn. Add Voltaren gel tid. observe with activity       LOS: 1 days A FACE TO [place] 09/02/2021, 10:43 AM

## 2021-09-02 NOTE — Evaluation (Signed)
Occupational Therapy Assessment and Plan  Patient Details  Name: Jennifer Porter MRN: [id number] Date of Birth: 06-02-1945  OT Diagnosis: acute pain, muscle weakness (generalized), and pain in joint Rehab Potential: Rehab Potential (ACUTE ONLY): Good ELOS: 14-17 days   Today's Date: 09/02/2021 OT Individual Time: 0932-3557 OT Individual Time Calculation (min): 75 min     Hospital Problem: Principal Problem:   Trauma Active Problems:   Type 2 diabetes mellitus without complication (HCC)   Subdural hematoma (HCC)   Subarachnoid hemorrhage (HCC)   Acute blood loss anemia   Past Medical History:  Past Medical History:  Diagnosis Date   Allergy    seasonal   Asthma    Chronic pancreatitis ([place])    Complication of anesthesia    Diabetes mellitus without complication ([place])    GERD (gastroesophageal reflux disease)    hiatal hernia   H/O: gout    Hyperlipidemia    Hypertension    Pancreatitis    PONV (postoperative nausea and vomiting)    Past Surgical History:  Past Surgical History:  Procedure Laterality Date   ABDOMINAL HYSTERECTOMY     APPENDECTOMY     Arthroscopic Left Knee Left 03/22/2003   CHOLECYSTECTOMY     in [place], 2010, without polyps   ESOPHAGOGASTRODUODENOSCOPY (EGD) WITH PROPOFOL N/A 11/08/2018   Procedure: ESOPHAGOGASTRODUODENOSCOPY (EGD) WITH PROPOFOL;  Surgeon: [name], MD;  Location: [facility];  Service: Endoscopy;  Laterality: N/A;   EUS N/A 11/08/2018   Procedure: UPPER ENDOSCOPIC ULTRASOUND (EUS) RADIAL;  Surgeon: [name], MD;  Location: [facility];  Service: Endoscopy;  Laterality: N/A;   INTRAMEDULLARY (IM) NAIL INTERTROCHANTERIC Left 08/27/2021   Procedure: INTRAMEDULLARY (IM) NAIL INTERTROCHANTRIC;  Surgeon: Shona Needles, MD;  Location: [facility];  Service: Orthopedics;  Laterality: Left;   ORIF WRIST FRACTURE Left 08/27/2021   Procedure: OPEN REDUCTION INTERNAL FIXATION (ORIF) WRIST FRACTURE;   Surgeon: Shona Needles, MD;  Location: [facility];  Service: Orthopedics;  Laterality: Left;    Assessment & Plan Clinical Impression:  Jennifer Porter is a 76 year old female with history of T2DM, chronic pancreatitis, GERD, gout who fell off her porch while chasing an animal away from her dog; struck her head, left hand and left hip in the process and had to call for help. She was evaluated at [facility] 08/26/21 and found to have small focus of [place] left convexity and small parafalcine SDH, left IT hip fracture and left distal radius fracture. She was transferred to [facility] for management. NS evaluated patient and felt no follow up films needed unless she declines due to the size of bleed. She underwent ORIF left distal radius and IM nailing of left hip on 06/09 by Dr. Doreatha Martin. Post op to be WBAT on LLE, NWB left wrist but WBAT thru left elbow.     To continue Lovenox for DVT prophylaxis and to transition to Eliquis at discharge.  On Keppra for seizure prophylaxis for 1 week till 06/16. ABLA stable and pain control improving. She has chronic pancreatitis and is on creon.  She reports having regular BMs. Cognitive evaluation done showing delay in recall as well as higher level cognitive tasks with SLUMS score 23/30. Therapy has been working with patient and she continues to be limited by pain, weight bearing restrictions, shuffling gait with difficulty advancing LLE, needs cues for sequencing as well as weakness, CIR recommended due to functional decline. Patient transferred to CIR  on 09/01/2021 .    Patient currently requires max with basic self-care skills secondary to muscle weakness, unbalanced muscle activation and decreased coordination, decreased problem solving, decreased safety awareness, decreased memory, and demonstrates behaviors consistent with [place], and decreased sitting balance, decreased standing balance, decreased postural control, decreased balance strategies, and difficulty maintaining  precautions.  Prior to hospitalization, patient could complete all ADL/IADLs completely independent .  Patient will benefit from skilled intervention to increase independence with basic self-care skills and increase level of independence with iADL prior to discharge home with care partner.  Anticipate patient will require intermittent supervision and follow up home health.  OT - End of Session Activity Tolerance: Tolerates 10 - 20 min activity with multiple rests Endurance Deficit: Yes Endurance Deficit Description: Rest breaks within BADL tasks OT Assessment Rehab Potential (ACUTE ONLY): Good OT Barriers to Discharge: Decreased caregiver support;Weight bearing restrictions OT Barriers to Discharge Comments: reports to have great familial support OT Patient demonstrates impairments in the following area(s): Balance;Safety;Edema;Endurance;Motor;Pain OT Basic ADL's Functional Problem(s): Bathing;Dressing;Toileting OT Advanced ADL's Functional Problem(s): Laundry;Full Meal Preparation OT Transfers Functional Problem(s): Toilet;Tub/Shower OT Additional Impairment(s): Fuctional Use of Upper Extremity OT Plan OT Intensity: Minimum of 1-2 x/day, 45 to 90 minutes OT Frequency: 5 out of 7 days OT Duration/Estimated Length of Stay: 14-17 days OT Treatment/Interventions: Balance/vestibular training;Discharge planning;Pain management;Self Care/advanced ADL retraining;Therapeutic Activities;UE/LE Coordination activities;Therapeutic Exercise;Patient/family education;Functional mobility training;DME/adaptive equipment instruction;Neuromuscular re-education;UE/LE Strength taining/ROM;Psychosocial support;Cognitive remediation/compensation;Community reintegration;Disease mangement/prevention;Functional electrical stimulation;Skin care/wound managment;Splinting/orthotics;Wheelchair propulsion/positioning;Visual/perceptual remediation/compensation OT Self Feeding Anticipated Outcome(s): independent OT Basic  Self-Care Anticipated Outcome(s): supervision OT Toileting Anticipated Outcome(s): supervision OT Bathroom Transfers Anticipated Outcome(s): supervision OT Recommendation Recommendations for Other Services: Therapeutic Recreation consult Therapeutic Recreation Interventions: Pet therapy Patient destination: Home Follow Up Recommendations: Home health OT Equipment Recommended: To be determined Equipment Details: will evaluate further   OT Evaluation Precautions/Restrictions  Precautions Precautions: Fall Precaution Comments: LUE wrapped Required Braces or Orthoses: Splint/Cast Splint/Cast: L wrist Restrictions Weight Bearing Restrictions: Yes LUE Weight Bearing: Non weight bearing (ok for L elbow) LLE Weight Bearing: Weight bearing as tolerated General Chart Reviewed: Yes Additional Pertinent History: previous back surgery Response to Previous Treatment: Patient with no complaints from previous session Family/Caregiver Present: No Pain Pain Assessment Pain Score: 5  Home Living/Prior [place] expects to be discharged to:: Private residence Living Arrangements: Spouse/significant other Available Help at Discharge: Available 24 hours/day, Other (Comment) (lives with spouse and family nearby in [place]) Type of Home: House Home Access: Ramped entrance [place]: One level Bathroom Shower/Tub: Tub/shower unit, Walk-in shower (uses walk in shower) Biochemist, clinical: Programmer, systems: Yes  Lives With: Spouse IADL History IADL Comments: Retired, takes care of her husband, drives, independent with all iADLs Prior Function Level of Independence: Independent with basic ADLs, Independent with homemaking with ambulation, Independent with transfers, Independent with gait  Able to Take Stairs?: Yes Driving: Yes 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, 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 Vocation Requirements: primary one caring for home and husband Leisure:  Hobbies-yes (Comment) (enjoys reading) Vision Baseline Vision/History: 1 Wears glasses Ability to See in Adequate Light: 0 Adequate Patient Visual Report: No change from baseline (reports some blurred vision with certain medications but otherwise same as baseline) Vision Assessment?: No apparent visual deficits Perception  Perception: Within Functional Limits Praxis Praxis: Intact Cognition Cognition Overall Cognitive Status: Impaired/Different from baseline Arousal/Alertness: Awake/alert Orientation Level: Person;Place;Situation Person: Oriented Place: Oriented Situation: Oriented Memory: Appears intact Memory Impairment: Retrieval deficit;Decreased recall of new information Attention:  Sustained Sustained Attention: Appears intact Awareness: Appears intact Awareness Impairment: Emergent impairment Problem Solving: Impaired Problem Solving Impairment: Functional complex Executive Function: Self Correcting Self Correcting: Impaired Self Correcting Impairment: Functional complex Safety/Judgment: Appears intact [place] [employer] Scales of Cognitive Functioning: Purposeful/appropriate Brief Interview for Mental Status (BIMS) Repetition of Three Words (First Attempt): 3 Temporal Orientation: Year: Correct Temporal Orientation: Month: Accurate within 5 days Temporal Orientation: Day: Correct Recall: "Sock": Yes, no cue required Recall: "Blue": Yes, no cue required Recall: "Bed": Yes, no cue required BIMS Summary Score: 15 Sensation Sensation Light Touch: Appears Intact Hot/Cold: Appears Intact Proprioception: Appears Intact Coordination Gross Motor Movements are Fluid and Coordinated: No Fine Motor Movements are Fluid and Coordinated: Yes Coordination and Movement Description: impaired d/t L hip/L wrist surgery/pain Finger Nose Finger Test: Intact    Trunk/Postural Assessment  Cervical Assessment Cervical Assessment: Within Functional Limits Thoracic  Assessment Thoracic Assessment: Within Functional Limits Lumbar Assessment Lumbar Assessment: Within Functional Limits Postural Control Postural Control: Deficits on evaluation  Balance Balance Balance Assessed: Yes Static Sitting Balance Static Sitting - Balance Support: Feet supported;No upper extremity supported Static Sitting - Level of Assistance: 5: Stand by assistance Dynamic Sitting Balance Dynamic Sitting - Balance Support: No upper extremity supported;Feet supported Dynamic Sitting - Level of Assistance: 5: Stand by assistance Dynamic Sitting - Balance Activities: Reaching for objects;Forward lean/weight shifting;Reaching across midline Sitting balance - Comments: able to sit without UE support Static Standing Balance Static Standing - Balance Support: Bilateral upper extremity supported Static Standing - Level of Assistance: 3: Mod assist Dynamic Standing Balance Dynamic Standing - Balance Support: During functional activity Dynamic Standing - Level of Assistance: 3: Mod assist Extremity/Trunk Assessment RUE Assessment RUE Assessment: Within Functional Limits Active Range of Motion (AROM) Comments: no deficits noted with RUE General Strength Comments: MMT 5/5 R shoulder flexion LUE Assessment LUE Assessment: Exceptions to [facility] Active Range of Motion (AROM) Comments: shoulder flexion WFL, wrist movement restricted d/t cast/surgery  Care Tool Care Tool Self Care Eating   Eating Assist Level: Set up assist    Oral Care    Oral Care Assist Level: Set up assist    Bathing   Body parts bathed by patient: Right arm;Left arm;Chest;Abdomen;Face Body parts bathed by helper: Left arm (helper assisted with L under arm and back) Body parts n/a: Right lower leg;Left lower leg;Buttocks;Right upper leg;Left upper leg (had already washed LB earlier) Assist Level: Maximal Assistance - Patient 24 - 49%    Upper Body Dressing(including orthotics)   What is the patient wearing?:  Bra;Pull over shirt   Assist Level: Moderate Assistance - Patient 50 - 74%    Lower Body Dressing (excluding footwear)   What is the patient wearing?: Pants;Underwear/pull up Assist for lower body dressing: Maximal Assistance - Patient 25 - 49%    Putting on/Taking off footwear   What is the patient wearing?: [place] for footwear: Total Assistance - Patient < 25%       Care Tool Toileting Toileting activity   Assist for toileting: 2 Helpers     Care Tool Bed Mobility Roll left and right activity   Roll left and right assist level: Minimal Assistance - Patient > 75%    Sit to lying activity   Sit to lying assist level: Moderate Assistance - Patient 50 - 74%    Lying to sitting on side of bed activity   Lying to sitting on side of bed assist level: the ability to move from lying on the  back to sitting on the side of the bed with no back support.: Moderate Assistance - Patient 50 - 74%     Care Tool Transfers Sit to stand transfer   Sit to stand assist level: Maximal Assistance - Patient 25 - 49%    Chair/bed transfer   Chair/bed transfer assist level: Maximal Assistance - Patient 25 - 49%     Toilet transfer   Assist Level: 2 Helpers     Care Tool Cognition  Expression of Ideas and Wants Expression of Ideas and Wants: 4. Without difficulty (complex and basic) - expresses complex messages without difficulty and with speech that is clear and easy to understand  Understanding Verbal and Non-Verbal Content Understanding Verbal and Non-Verbal Content: 4. Understands (complex and basic) - clear comprehension without cues or repetitions   Memory/Recall Ability Memory/Recall Ability : That he or she is in a hospital/hospital unit;Current season   Refer to Care Plan for Long Term Goals  SHORT TERM GOAL WEEK 1 OT Short Term Goal 1 (Week 1): Pt will complete LB dressing with Mod A. OT Short Term Goal 2 (Week 1): Pt will complete stand pivot toilet transfer with Min A. OT  Short Term Goal 3 (Week 1): Pt will complete 1/3 toileting task with Mod A.  Recommendations for other services: Therapeutic Recreation  Pet therapy   Skilled Therapeutic Intervention Skilled OT evaluation completed with the creation of pt centered OT POC. Pt educated on condition, ELOS, rehab expectations, and fall risk reduction strategies throughout session. ADLs limited by pain in LLE and LUE see detailed outline below of ADL performance. Pt left seated in w/c with chair alarm on, call bell in reach, and all needs met.   ADL ADL Eating: Set up Where Assessed-Eating: Bed level (sat EOB and ate breakfast) Grooming: Setup Where Assessed-Grooming: Sitting at sink Upper Body Bathing: Minimal assistance Where Assessed-Upper Body Bathing: Sitting at sink Lower Body Bathing: Maximal assistance Where Assessed-Lower Body Bathing: Wheelchair Upper Body Dressing: Moderate assistance Where Assessed-Upper Body Dressing: Sitting at sink Lower Body Dressing: Maximal assistance Where Assessed-Lower Body Dressing: Standing at sink;Sitting at sink Toileting: Maximal assistance Where Assessed-Toileting: Bedside Commode Toilet Transfer: Maximal assistance Toilet Transfer Method: Stand pivot Toilet Transfer Equipment: Bedside commode Tub/Shower Transfer: Not assessed Social research officer, government: Not assessed ADL Comments: Pt completed dressing seated in w/c and required assistance to don bra for UB dressing. Mobility  Bed Mobility Bed Mobility: Rolling Right;Right Sidelying to Sit Rolling Right: Minimal Assistance - Patient > 75% Right Sidelying to Sit: Moderate Assistance - Patient 50-74% Transfers Sit to Stand: Maximal Assistance - Patient 25-49%  Discharge Criteria: Patient will be discharged from OT if patient refuses treatment 3 consecutive times without medical reason, if treatment goals not met, if there is a change in medical status, if patient makes no progress towards goals or if patient is  discharged from hospital.  The above assessment, treatment plan, treatment alternatives and goals were discussed and mutually agreed upon: by patient  Catalina Lunger 09/02/2021, 3:51 PM

## 2021-09-02 NOTE — Plan of Care (Signed)
  Problem: Consults Goal: RH GENERAL PATIENT EDUCATION Description: See Patient Education module for education specifics. Outcome: Progressing Goal: Skin Care Protocol Initiated - if Braden Score 18 or less Description: If consults are not indicated, leave blank or document N/A Outcome: Progressing Goal: Diabetes Guidelines if Diabetic/Glucose > 140 Description: If diabetic or lab glucose is > 140 mg/dl - Initiate Diabetes/Hyperglycemia Guidelines & Document Interventions  Outcome: Progressing   Problem: RH BLADDER ELIMINATION Goal: RH STG MANAGE BLADDER WITH ASSISTANCE Description: STG Manage Bladder With Supervision Assistance Outcome: Progressing Goal: RH STG MANAGE BLADDER WITH MEDICATION WITH ASSISTANCE Description: STG Manage Bladder With Medication With Supervision Assistance. Outcome: Progressing   Problem: RH SKIN INTEGRITY Goal: RH STG MAINTAIN SKIN INTEGRITY WITH ASSISTANCE Description: STG Maintain Skin Integrity With Supervision Assistance. Outcome: Progressing Goal: RH STG ABLE TO PERFORM INCISION/WOUND CARE W/ASSISTANCE Description: STG Able To Perform Incision/Wound Care With Supervision Assistance. Outcome: Progressing   Problem: RH SAFETY Goal: RH STG ADHERE TO SAFETY PRECAUTIONS W/ASSISTANCE/DEVICE Description: STG Adhere to Safety Precautions With Cues and Reminders. Outcome: Progressing Goal: RH STG DECREASED RISK OF FALL WITH ASSISTANCE Description: STG Decreased Risk of Fall With Supervision Assistance. Outcome: Progressing   Problem: RH PAIN MANAGEMENT Goal: RH STG PAIN MANAGED AT OR BELOW PT'S PAIN GOAL Description: < 3 on a 0-10 pain scale. Outcome: Progressing   Problem: RH KNOWLEDGE DEFICIT GENERAL Goal: RH STG INCREASE KNOWLEDGE OF SELF CARE AFTER HOSPITALIZATION Description: Patient will demonstrate knowledge of self-care management, medication/pain management, skin/wound care, weight bearing precautions with educational materials and handouts  provided by staff independently at discharge. Outcome: Progressing

## 2021-09-03 LAB — GLUCOSE, CAPILLARY
Glucose-Capillary: 126 mg/dL — ABNORMAL HIGH (ref 70–99)
Glucose-Capillary: 205 mg/dL — ABNORMAL HIGH (ref 70–99)
Glucose-Capillary: 182 mg/dL — ABNORMAL HIGH (ref 70–99)
Glucose-Capillary: 192 mg/dL — ABNORMAL HIGH (ref 70–99)

## 2021-09-03 LAB — MAGNESIUM: Magnesium: 1.7 mg/dL (ref 1.7–2.4)

## 2021-09-03 MED ORDER — METFORMIN HCL 500 MG PO TABS
500.0000 mg | ORAL_TABLET | Freq: Every day | ORAL | Status: DC
Start: 2021-09-03 — End: 2021-09-07
  Administered 2021-09-03 – 2021-09-07 (×5): 500 mg via ORAL
  Filled 2021-09-03 (×5): qty 1

## 2021-09-03 NOTE — Progress Notes (Signed)
PROGRESS NOTE   Subjective/Complaints: Had a reasonable day with therapy. Realizes that she needs to take a little more than tylenol to tolerate mobility with PT.   ROS: Patient denies fever, rash, sore throat, blurred vision, dizziness, nausea, vomiting, diarrhea, cough, shortness of breath or chest pain,  neck pain, headache, or mood change.    Objective:   DG Knee Complete 4 Views Left  Result Date: 09/01/2021 CLINICAL DATA:  Knee pain, known hip fracture. EXAM: LEFT KNEE - COMPLETE 4+ VIEW COMPARISON:  None Available. FINDINGS: There is medial soft tissue swelling. The bones are osteopenic. No significant joint effusion. No acute fracture or dislocation. There is medial and patellofemoral compartment joint space narrowing with osteophyte formation compatible with degenerative change. IMPRESSION: 1. Medial soft tissue swelling. 2. No acute fracture or dislocation identified. 3. Moderate degenerative changes. Electronically Signed   By: Ronney Asters M.D.   On: 09/01/2021 19:45    Recent Labs    09/02/21 0519  WBC 6.1  HGB 8.8*  HCT 26.1*  PLT 277   Recent Labs    09/01/21 1739 09/02/21 0519  NA 140 141  K 3.7 3.5  CL 104 104  CO2 26 25  GLUCOSE 156* 128*  BUN 20 18  CREATININE 0.74 0.68  CALCIUM 8.9 8.9    Intake/Output Summary (Last 24 hours) at 09/03/2021 1033 Last data filed at 09/03/2021 0800 Gross per 24 hour  Intake 878.24 ml  Output 900 ml  Net -21.76 ml        Physical Exam: Vital Signs Blood pressure 127/61, pulse 89, temperature 98.2 F (36.8 C), temperature source Oral, resp. rate 16, height '5\' 4"'$  (1.626 m), weight 59 kg, SpO2 98 %.  Constitutional: No distress . Vital signs reviewed. HEENT: NCAT, EOMI, oral membranes moist Neck: supple Cardiovascular: RRR without murmur. No JVD    Respiratory/Chest: CTA Bilaterally without wheezes or rales. Normal effort    GI/Abdomen: BS +, non-tender,  non-distended Ext: no clubbing, cyanosis, or edema Psych: pleasant and cooperative  Skin: Clean and intact without signs of breakdown. Left hip wound is cdi Neuro:  seems to be alert with reasonable insight and awareness. Functional memory. Normal language and speech. No CN findings. RUE 5/5. LUE 3-4/5 limited at forearm d/t fx. LLE 2/5 prox to 4-/5 distally Musculoskeletal: left hip and forearm pain. Left forearm is wrapped with ACE   Assessment/Plan: 1. Functional deficits which require 3+ hours per day of interdisciplinary therapy in a comprehensive inpatient rehab setting. Physiatrist is providing close team supervision and 24 hour management of active medical problems listed below. Physiatrist and rehab team continue to assess barriers to discharge/monitor patient progress toward functional and medical goals  Care Tool:  Bathing    Body parts bathed by patient: Right arm, Left arm, Chest, Abdomen, Face   Body parts bathed by helper: Left arm (helper assisted with L under arm and back) Body parts n/a: Right lower leg, Left lower leg, Buttocks, Right upper leg, Left upper leg (had already washed LB earlier)   Bathing assist Assist Level: Maximal Assistance - Patient 24 - 49%     Upper Body Dressing/Undressing Upper body dressing  What is the patient wearing?: Bra, Pull over shirt    Upper body assist Assist Level: Moderate Assistance - Patient 50 - 74%    Lower Body Dressing/Undressing Lower body dressing      What is the patient wearing?: Pants, Underwear/pull up     Lower body assist Assist for lower body dressing: Maximal Assistance - Patient 25 - 49%     Toileting Toileting    Toileting assist Assist for toileting: 2 Helpers     Transfers Chair/bed transfer  Transfers assist     Chair/bed transfer assist level: Moderate Assistance - Patient 50 - 74%     Locomotion Ambulation   Ambulation assist      Assist level: Moderate Assistance - Patient 50  - 74% Assistive device: Walker-platform Max distance: 20'   Walk 10 feet activity   Assist     Assist level: Moderate Assistance - Patient - 50 - 74% Assistive device: Walker-platform   Walk 50 feet activity   Assist Walk 50 feet with 2 turns activity did not occur: Safety/medical concerns         Walk 150 feet activity   Assist Walk 150 feet activity did not occur: Safety/medical concerns         Walk 10 feet on uneven surface  activity   Assist Walk 10 feet on uneven surfaces activity did not occur: Safety/medical concerns         Wheelchair     Assist Is the patient using a wheelchair?: No             Wheelchair 50 feet with 2 turns activity    Assist            Wheelchair 150 feet activity     Assist          Blood pressure 127/61, pulse 89, temperature 98.2 F (36.8 C), temperature source Oral, resp. rate 16, height '5\' 4"'$  (1.626 m), weight 59 kg, SpO2 98 %. Medical Problem List and Plan: 1. Functional deficits secondary to polytrauma with SAH left convexity , small parafalcine SDH, left IT hip fracture and left distal radius fracture             -patient may shower, please cover incisions             -ELOS/Goals: 10 to 14 days, supervision to min asist with PT and OT, mod I to supervision with SLP  -Patient is beginning CIR therapies today including PT, OT, and SLP  2.  Antithrombotics: -DVT/anticoagulation:  Pharmaceutical: Lovenox--Eliquis at d/c.             -antiplatelet therapy: N/A 3. Pain Management: Continue tylenol tid--  650 mg, Gabapentin '300mg'$  HS             -continue robaxin TID with oxycodone prn.   -pt hesitant to use narcotics but now will take an oxycodone 30" prior to her PT sessions 4. Mood/Sleep:  LCSW to follow for evaluation and support.              -antipsychotic agents: N/A 5. Neuropsych/cognition: This patient is capable of making decisions on his own behalf. 6. Skin/Wound Care: Routine pressure  relief measures.  7. Fluids/Electrolytes/Nutrition: encourage PO  -hypomagnesia up to 1.7 6/16, s/p 2 runs IVPB Mg++   -continue oral mag   -recheck mag level Monday  -add protein supp for low albumin 8. Left distal radius Fx s/p ORIF: NWB left wrist but can WBAT thorough left elbow             --  continue splint left forearm.  9. Left IT hip Fx s/p IM nailing: WBAT 10 ABLA:                -HGB 9.2 6/11--holding at 8.8 6/15  -added Fe++ supp  -recheck level Monday 11. T2DM: Hgb A1c- 6.4. Will monitor BS ac/hs and use SSI for elevated BS/wound healing.              --Resume levemir 5 units at bedtime and monitor BS.  --Continue to hold metformin for now.  -CBG (last 3)  Recent Labs    09/02/21 1629 09/02/21 2052 09/03/21 0542  GLUCAP 224* 218* 126*   PM sugars elevated 6/16 will resume metformin '500mg'$  daily to start (on '1000mg'$  bid at home) 12. Small SDH/SAH: No follow up per NS. Monitor for any neurological changes.  13. Vitamin D insufficiency: 24.98-->add ergocalciferol for supplement.  14. Chronic pancreatitis: Followed by Dr. Gala Romney and recent flare 07/2021? --Abdominal pain/diarrhea managed w/creon and prn bentyl.  --  miralax prn.  15. HTN: Monitor BP TID. Continue Lisinopril 20 mg/day.              -BP well controlled 6/16 16. Seizure prophylaxis: Keppra bid thorough 06/16.  17. Endstage OA left knee: Followed by Dr. Wynelle Link injection Jan (trying to avoid surgery)             --OA moderate on xray at admit             --Ice prn. Add Voltaren gel tid. observe with activity       LOS: 2 days A FACE TO FACE EVALUATION WAS PERFORMED  Meredith Staggers 09/03/2021, 10:33 AM

## 2021-09-03 NOTE — Progress Notes (Signed)
Occupational Therapy Session Note  Patient Details  Name: Jennifer Porter MRN: [id number] Date of Birth: [birth year]  Today's Date: 09/03/2021 OT Individual Time: 7342-8768 OT Individual Time Calculation (min): 30 min    Short Term Goals: Week 1:  OT Short Term Goal 1 (Week 1): Pt will complete LB dressing with Mod A. OT Short Term Goal 1 - Progress (Week 1): Progressing toward goal OT Short Term Goal 2 (Week 1): Pt will complete stand pivot toilet transfer with Min A. OT Short Term Goal 2 - Progress (Week 1): Progressing toward goal OT Short Term Goal 3 (Week 1): Pt will complete 1/3 toileting task with Mod A. OT Short Term Goal 3 - Progress (Week 1): Progressing toward goal  Skilled Therapeutic Interventions/Progress Updates:     S: Pt up in w/c at therapy arrival and agreeable to participate in OT session.   O: - Participated in patient education regarding pain management, LUE positioning when seated in w/c, and precautions. Pt verbalized understanding.  - HEP established: A/ROM hand/finger and elbow exercises. Provided handout, provided visual cues and demonstration. Pt returned demonstration and verbalized understanding.  - HEP: Composite finger flexion, washcloth crumble, finger abduction/adduction, (Passive and active) thumb adduction/abduction, elbow flexion/extension. 10X, 1 set.   A: Patient verbalizes more pain in LLE versus LUE. Established HEP consisting of A/ROM of the hand and elbow. Pt demonstrates max difficulty when attempting to perform thumb abduction/adduction. Pt was provided demonstration and encouraged to complete movement passively then progressing to active ROM when able. Message sent to next schedule OT to trial an elbow pad sleeve as patient verbalizes discomfort when resting her elbow on surfaces. Patient's personal blanket placed under left arm to temporally provide relief.     P: Follow up on carry over of HEP and pain management.  Therapy  Documentation Precautions:  Precautions Precautions: Fall Precaution Comments: LUE wrapped Required Braces or Orthoses: Splint/Cast Splint/Cast: L wrist Restrictions Weight Bearing Restrictions: Yes LUE Weight Bearing: Non weight bearing (FWB proximal to wrist) LLE Weight Bearing: Weight bearing as tolerated  Pain:  Reports a 4/10 pain in left hip. No number provided for left wrist/forearm. States that it feels like a needle is poking.    Therapy/Group: Individual Therapy   Ailene Ravel, OTR/L,CBIS  Supplemental OT - [facility]  09/03/2021, 7:55 AM

## 2021-09-03 NOTE — Progress Notes (Signed)
Physical Therapy Session Note  Patient Details  Name: Jennifer Porter MRN: [id number] Date of Birth: 07/04/1945  Today's Date: 09/03/2021 PT Individual Time: 8127-5170 PT Individual Time Calculation (min): 42 min   Short Term Goals: Week 1:  PT Short Term Goal 1 (Week 1): Pt will complete bed mobility with minA. PT Short Term Goal 2 (Week 1): Pt will complete bed to chair transfer with minA. PT Short Term Goal 3 (Week 1): Pt will ambulate x50' with minA and LRAD. PT Short Term Goal 4 (Week 1): Pt will initiate stair training.  Skilled Therapeutic Interventions/Progress Updates:     Pt received seated in recliner and agrees to therapy. Reports pain in L hip. Stiff in nature. Pt reports having taken pain meds 30 minutes prior to session. PT provides mobility and rest breaks to manage pain.  Pt performs sit to stand with minA and cues for hand placement and body mechanics. Pt ambulates x25' with platform RW and CGA, with cues for step sequencing and posture. Pt has very antalgic gait pattern but with longer steps on L than previous session. WC transport to gym for time management. Pt performs stand pivot transfer to Nustep with modA and cues for sequencing and positioning. Pt performs Nustep for strength and endurance training as well as to increase ROM in L lower extremity. Pt starts very slowly initially and gradually increases speed as stiffness improves. Pt encouraged to maintain speed at 40 steps per minute and is able to maintain ~30-35 for majority of time. Pt completes x13:00 total. Stand pivot back to WC with modA. Stand pivot transfer back to bed with modA. Sit to supine with modA management of bilateral lower extremities. Pt left semi reclined with alarm intact and all needs within reach.  Therapy Documentation Precautions:  Precautions Precautions: Fall Precaution Comments: LUE wrapped Required Braces or Orthoses: Splint/Cast Splint/Cast: L wrist Restrictions Weight Bearing  Restrictions: Yes LUE Weight Bearing: Non weight bearing LLE Weight Bearing: Weight bearing as tolerated   Therapy/Group: Individual Therapy  Breck Coons, PT, DPT 09/03/2021, 5:16 PM

## 2021-09-03 NOTE — Progress Notes (Signed)
Occupational Therapy Session Note  Patient Details  Name: Jennifer Porter MRN: [id number] Date of Birth: 1945/11/12  Today's Date: 09/03/2021 OT Individual Time: 1000-1115 OT Individual Time Calculation (min): 75 min    Today's Date: 09/03/2021 OT Individual Time: 1300-1345 OT Individual Time Calculation (min): 45 min   Short Term Goals: Week 1:  OT Short Term Goal 1 (Week 1): Pt will complete LB dressing with Mod A. OT Short Term Goal 1 - Progress (Week 1): Progressing toward goal OT Short Term Goal 2 (Week 1): Pt will complete stand pivot toilet transfer with Min A. OT Short Term Goal 2 - Progress (Week 1): Progressing toward goal OT Short Term Goal 3 (Week 1): Pt will complete 1/3 toileting task with Mod A. OT Short Term Goal 3 - Progress (Week 1): Progressing toward goal  Skilled Therapeutic Interventions/Progress Updates:    Session 1: Pt received seated in w/c and agreeable to OT. Pt voiced she did not sleep well but did not report any acute pain. Completed oral hygiene and UB bathing sitting at the sink with set up. Therapist washed pts back. Donned UB clothing with Min A and LB clothing with Max A. Stood with [place] with Min A and proceeded to complete functional mobility in the room and into the hallway for 74f with PHayden Therapist provided verbal and physical cueing to bend knees and shift weight while stepping. Pt consistently tip toeing her foot up instead of lifting it off the ground. With cues, pt able to demonstrate correctly 2-3x. Transported to ortho gym d/t time management and fatigue/pain. Pt reported 5/10 pain in LLE after functional mobility. Demonstrated hand HEP with wash cloth and LUE supported on table for 8 reps. Transported back to the room and left in w/c with call bell in reach, chair alarm on, and all needs met.  Session 2: Pt received seated in w/c with niece present in the room and agreeable to OT tx. Pt wheeled to day gym for time management/energy conservation.  Pt seated completed 10x toe touches to small yellow ball promoting hip flexion and knee extension of LLE. With wash cloth under LLE, slid foot forward/backward to facilitate bending of the knee x10.Therapist stabilizing/manipulating the knee to maintain proper mechanics. Pt reports 4-5/10 pain after exercise. Completed 355fof functional mobility back to room with PFRW and Min A. Focused on hip/knee extension and weight shifting while walking. Pt left seated in w/c with chair alarm on, call bell in reach, and all needs met. Niece present in the room.   Therapy Documentation Precautions:  Precautions Precautions: Fall Precaution Comments: LUE wrapped Required Braces or Orthoses: Splint/Cast Splint/Cast: L wrist Restrictions Weight Bearing Restrictions: Yes LUE Weight Bearing: Non weight bearing LLE Weight Bearing: Weight bearing as tolerated  Therapy/Group: Individual Therapy  LaCatalina Lunger/16/2023, 11:41 AM

## 2021-09-03 NOTE — Progress Notes (Signed)
   09/03/21 1450  Clinical Encounter Type  Visited With Patient not available  Visit Type  (Spiritual Consult for Advance Dir)  Referral From Nurse  Consult/Referral To Chaplain  Recommendations Follow-up required   Chaplain responded to Spiritual Consult requesting AD. Patient indicated that she is doing ok and is well supported by her niece and daughter. Provided copies of the AD and requested that patient review. Patient requested that Chaplain stop by on Monday, June 19 when the spouse will be available as well.

## 2021-09-03 NOTE — Plan of Care (Signed)
Behavioral Plan   [place] Level: VIII  Behavior to decrease/ eliminate:  -pain   Changes to environment:   -Patient likes thermostat set at 73 degrees  -when in wheelchair, please place a pillow and her blue blanket under her LUE and fold the yellow foam egg crate in half under the heal of her LLE with a pillow under her leg -Please administer pain medications 30 minutes prior to PT/OT -Does not like sheets/blankets over feet -Pease help position on right side for sleeping with pillow between her legs and under her arm   Interventions:  -Does not need chair alarm when up -Bed alarm -WBAT on LLE/ weightbearing through elbow only on LUE -Use platform walker with all transfers    Recommendations for interactions with patient:  -Bedside table and call light on right side  -Likes to be sitting up in wheelchair by the window   Attendees:    Weston Anna, SLP Cherylynn Ridges, OT Tereasa Coop, PT

## 2021-09-03 NOTE — Progress Notes (Signed)
Inpatient Rehabilitation Care Coordinator Assessment and Plan Patient Details  Name: Jennifer Porter MRN: [id number] Date of Birth: 03/02/1946  Today's Date: 09/03/2021  Hospital Problems: Principal Problem:   Trauma Active Problems:   Type 2 diabetes mellitus without complication (HCC)   Subdural hematoma (HCC)   Subarachnoid hemorrhage (HCC)   Acute blood loss anemia  Past Medical History:  Past Medical History:  Diagnosis Date   Allergy    seasonal   Asthma    Chronic pancreatitis (HCC)    Complication of anesthesia    Diabetes mellitus without complication (HCC)    GERD (gastroesophageal reflux disease)    hiatal hernia   H/O: gout    Hyperlipidemia    Hypertension    Pancreatitis    PONV (postoperative nausea and vomiting)    Past Surgical History:  Past Surgical History:  Procedure Laterality Date   ABDOMINAL HYSTERECTOMY     APPENDECTOMY     Arthroscopic Left Knee Left 03/22/2003   CHOLECYSTECTOMY     in 1990s   COLONOSCOPY     [place], 2010, without polyps   ESOPHAGOGASTRODUODENOSCOPY (EGD) WITH PROPOFOL N/A 11/08/2018   Procedure: ESOPHAGOGASTRODUODENOSCOPY (EGD) WITH PROPOFOL;  Surgeon: Jacobs, Daniel P, MD;  Location: [facility];  Service: Endoscopy;  Laterality: N/A;   EUS N/A 11/08/2018   Procedure: UPPER ENDOSCOPIC ULTRASOUND (EUS) RADIAL;  Surgeon: Jacobs, Daniel P, MD;  Location: [facility];  Service: Endoscopy;  Laterality: N/A;   INTRAMEDULLARY (IM) NAIL INTERTROCHANTERIC Left 08/27/2021   Procedure: INTRAMEDULLARY (IM) NAIL INTERTROCHANTRIC;  Surgeon: Haddix, Kevin P, MD;  Location: [facility];  Service: Orthopedics;  Laterality: Left;   ORIF WRIST FRACTURE Left 08/27/2021   Procedure: OPEN REDUCTION INTERNAL FIXATION (ORIF) WRIST FRACTURE;  Surgeon: Haddix, Kevin P, MD;  Location: [facility] OR;  Service: Orthopedics;  Laterality: Left;   Social History:  reports that she has never smoked. She has never used smokeless tobacco. She reports that she does not  drink alcohol and does not use drugs.  Family / Support Systems Marital Status: Married How Long?: 59 years Patient Roles: Spouse Spouse/Significant Other: Colon (husband) Children: 1 adult dtr Wanda (lives in [place]; PRN assistance on weekends) Other Supports: neices Anticipated Caregiver: husband can only provde supervision Ability/Limitations of Caregiver: Pt reports support from her husband, various family and neighbors who she says can provide support Caregiver Availability: 24/7 Family Dynamics: Pt lives with her husband. Helps her husband with peritoneal dialysis in the evening. Husband is able to complete on his own but she helps.  Social History Preferred language: English Religion: Baptist Cultural Background: Pt worked as an elementary school teacher for 28 yrs Education: college Health Literacy - How often do you need to have someone help you when you read instructions, pamphlets, or other written material from your doctor or pharmacy?: Never Writes: Yes Employment Status: Retired Age Retired: 66 Legal History/Current Legal Issues: Denies Guardian/Conservator: N/A   Abuse/Neglect Abuse/Neglect Assessment Can Be Completed: Yes Physical Abuse: Denies Verbal Abuse: Denies Sexual Abuse: Denies Exploitation of patient/patient's resources: Denies Self-Neglect: Denies  Patient response to: Social Isolation - How often do you feel lonely or isolated from those around you?: Never  Emotional Status Pt's affect, behavior and adjustment status: Pt in good spirits at time of visit. Recent Psychosocial Issues: Denies Psychiatric History: Denies Substance Abuse History: Denies  Patient / Family Perceptions, Expectations & Goals Pt/Family understanding of illness & functional limitations: Pt and family have general understanding of pt care needs Premorbid pt/family roles/activities: Independent   Inpatient Rehabilitation Care Coordinator Assessment and Plan Patient Details  Name: Jennifer Porter MRN: [id number] Date of Birth: 1945-09-07  Today's Date: 09/03/2021  Hospital Problems: Principal Problem:   Trauma Active Problems:   Type 2 diabetes mellitus without complication (HCC)   Subdural hematoma (HCC)   Subarachnoid hemorrhage (HCC)   Acute blood loss anemia  Past Medical History:  Past Medical History:  Diagnosis Date   Allergy    seasonal   Asthma    Chronic pancreatitis ([place])    Complication of anesthesia    Diabetes mellitus without complication ([place])    GERD (gastroesophageal reflux disease)    hiatal hernia   H/O: gout    Hyperlipidemia    Hypertension    Pancreatitis    PONV (postoperative nausea and vomiting)    Past Surgical History:  Past Surgical History:  Procedure Laterality Date   ABDOMINAL HYSTERECTOMY     APPENDECTOMY     Arthroscopic Left Knee Left 03/22/2003   CHOLECYSTECTOMY     in [place], 2010, without polyps   ESOPHAGOGASTRODUODENOSCOPY (EGD) WITH PROPOFOL N/A 11/08/2018   Procedure: ESOPHAGOGASTRODUODENOSCOPY (EGD) WITH PROPOFOL;  Surgeon: [name], MD;  Location[facility];  Service: Endoscopy;  Laterality: N/A;   EUS N/A 11/08/2018   Procedure: UPPER ENDOSCOPIC ULTRASOUND (EUS) RADIAL;  Surgeon: [name], MD;  [facility];  Service: Endoscopy;  Laterality: N/A;   INTRAMEDULLARY (IM) NAIL INTERTROCHANTERIC Left 08/27/2021   Procedure: INTRAMEDULLARY (IM) NAIL INTERTROCHANTRIC;  Surgeon: Shona Needles, MD;  Location: [facility];  Service: Orthopedics;  Laterality: Left;   ORIF WRIST FRACTURE Left 08/27/2021   Procedure: OPEN REDUCTION INTERNAL FIXATION (ORIF) WRIST FRACTURE;  Surgeon: Shona Needles, MD;  Location: [facility];  Service: Orthopedics;  Laterality: Left;   Social History:  reports that she has never smoked. She has never used smokeless tobacco. She reports that she does not  drink alcohol and does not use drugs.  Family / Support Systems Marital Status: Married How Long?: 71 years Patient Roles: Spouse Spouse/Significant Other: Colon (husband) Children: 1 adult dtr [name] (lives in [place]; PRN assistance on weekends) Other Supports: neices Anticipated Caregiver: husband can only provde supervision Ability/Limitations of Caregiver: Pt reports support from her husband, various family and neighbors who she says can provide support Caregiver Availability: 24/7 Family Dynamics: Pt lives with her husband. Helps her husband with peritoneal dialysis in the evening. Husband is able to complete on his own but she helps.  Social History Preferred language: English Religion: Baptist Cultural Background: Pt worked as an Automotive engineer for 28 yrs Education: Medical sales representative - How often do you need to have someone help you when you read instructions, pamphlets, or other written material from your doctor or pharmacy?: Never Writes: Yes Employment Status: Retired Age Retired: 66 Public relations account executive Issues: Denies Guardian/Conservator: N/A   Abuse/Neglect Abuse/Neglect Assessment Can Be Completed: Yes Physical Abuse: Denies Verbal Abuse: Denies Sexual Abuse: Denies Exploitation of patient/patient's resources: Denies Self-Neglect: Denies  Patient response to: Social Isolation - How often do you feel lonely or isolated from those around you?: Never  Emotional Status Pt's affect, behavior and adjustment status: Pt in good spirits at time of visit. Recent Psychosocial Issues: Denies Psychiatric History: Denies Substance Abuse History: Denies  Patient / Family Perceptions, Expectations & Goals Pt/Family understanding of illness & functional limitations: Pt and family have general understanding of pt care needs Premorbid pt/family roles/activities: Independent

## 2021-09-03 NOTE — Progress Notes (Signed)
Patient ID: Jennifer Porter, female   DOB: 05-05-1945, 76 y.o.   MRN: [id number]  SW made contact with pt husband Colton to introduce self, explain role, discuss discharge process, and ELOS. SW shared will follow-up after team conference.   Loralee Pacas, MSW, [place] Office: [phone] Cell: [phone] Fax: [phone]

## 2021-09-03 NOTE — Plan of Care (Signed)
  Problem: Consults Goal: RH GENERAL PATIENT EDUCATION Description: See Patient Education module for education specifics. Outcome: Progressing Goal: Skin Care Protocol Initiated - if Braden Score 18 or less Description: If consults are not indicated, leave blank or document N/A Outcome: Progressing Goal: Diabetes Guidelines if Diabetic/Glucose > 140 Description: If diabetic or lab glucose is > 140 mg/dl - Initiate Diabetes/Hyperglycemia Guidelines & Document Interventions  Outcome: Progressing   Problem: RH BLADDER ELIMINATION Goal: RH STG MANAGE BLADDER WITH ASSISTANCE Description: STG Manage Bladder With Supervision Assistance Outcome: Progressing Goal: RH STG MANAGE BLADDER WITH MEDICATION WITH ASSISTANCE Description: STG Manage Bladder With Medication With Supervision Assistance. Outcome: Progressing   Problem: RH SKIN INTEGRITY Goal: RH STG MAINTAIN SKIN INTEGRITY WITH ASSISTANCE Description: STG Maintain Skin Integrity With Supervision Assistance. Outcome: Progressing Goal: RH STG ABLE TO PERFORM INCISION/WOUND CARE W/ASSISTANCE Description: STG Able To Perform Incision/Wound Care With Supervision Assistance. Outcome: Progressing   Problem: RH SAFETY Goal: RH STG ADHERE TO SAFETY PRECAUTIONS W/ASSISTANCE/DEVICE Description: STG Adhere to Safety Precautions With Cues and Reminders. Outcome: Progressing Goal: RH STG DECREASED RISK OF FALL WITH ASSISTANCE Description: STG Decreased Risk of Fall With Supervision Assistance. Outcome: Progressing   Problem: RH PAIN MANAGEMENT Goal: RH STG PAIN MANAGED AT OR BELOW PT'S PAIN GOAL Description: < 3 on a 0-10 pain scale. Outcome: Progressing   Problem: RH KNOWLEDGE DEFICIT GENERAL Goal: RH STG INCREASE KNOWLEDGE OF SELF CARE AFTER HOSPITALIZATION Description: Patient will demonstrate knowledge of self-care management, medication/pain management, skin/wound care, weight bearing precautions with educational materials and handouts  provided by staff independently at discharge. Outcome: Progressing   Problem: Consults Goal: RH BRAIN INJURY PATIENT EDUCATION Description: Description: See Patient Education module for eduction specifics Outcome: Progressing

## 2021-09-03 NOTE — Patient Instructions (Signed)
AROM: DIP Flexion / Extension   Pinch middle knuckle of ________ finger of right hand to prevent bending. Bend end knuckle until stretch is felt. Hold ____ seconds. Relax. Straighten finger as far as possible. Repeat ____ times per set. Do ____ sets per session. Do ____ sessions per day.  Copyright  VHI. All rights reserved.    AROM: PIP Flexion / Extension   Pinch bottom knuckle of ________ finger of right hand to prevent bending. Actively bend middle knuckle until stretch is felt. Hold ____ seconds. Relax. Straighten finger as far as possible. Repeat ____ times per set. Do ____ sets per session. Do ____ sessions per day.  Copyright  VHI. All rights reserved.   AROM: Finger Flexion / Extension   Actively bend fingers of right hand. Start with knuckles furthest from palm, and slowly make a fist. Hold __5__ seconds. Relax. Then straighten fingers as far as possible. Repeat ___10_ times per set. Do ___1_ sets per session. Do __2-3__ sessions per day.  Copyright  VHI. All rights reserved.   Paper Crumpling Exercise   Begin with right palm down on washcloth. Maintaining contact between surface and heel of hand, crumple washcloth into a ball. Repeat ___5_ times per set. Do __1__ sets per session. Do ___1-2_ sessions per day.  Copyright  VHI. All rights reserved.     Abduction / Adduction (Active)    With hand flat on table, spread all fingers apart, then bring them together as close as possible. Repeat __10__ times. Do ___2-3_ sessions per day.  Copyright  VHI. All rights reserved.  AROM: Thumb Abduction / Adduction   Actively pull right thumb away from palm as far as possible. Hold __3__ seconds. Then bring thumb back to touch fingers. Try not to bend fingers toward thumb. Repeat ___10_ times per set. Do __1__ sets per session. Do __2-3__ sessions per day.  Copyright  VHI. All rights reserved.     AROM: Elbow Flexion / Extension   With left hand palm up,  gently bend elbow as far as possible. Then straighten arm as far as possible. Repeat _10___ times per set. Do __1__ sets per session. Do __2-3__ sessions per day.  Copyright  VHI. All rights reserved.

## 2021-09-03 NOTE — Progress Notes (Signed)
Speech Language Pathology TBI Note  Patient Details  Name: Jennifer Porter MRN: [id number] Date of Birth: Apr 29, 1945  Today's Date: 09/03/2021 SLP Individual Time: 7858-8502 SLP Individual Time Calculation (min): 42 min  Short Term Goals: Week 1: SLP Short Term Goal 1 (Week 1): Patient will demonstrate complex problem solving for functional tasks with Mod I. SLP Short Term Goal 2 (Week 1): Patient will recall new, daily information with Mod I.  Skilled Therapeutic Interventions: Skilled treatment session focused on cognitive goals. Upon arrival, patient was awake while upright in the wheelchair and reported fatigue. Patient independently recalled events from yesterday and previous therapy sessions from earlier in the day. Patient recalled functional including the PT requesting that the patient take pain medicine 30 minutes prior to session.  Patient also recalled her current medications and their functions independently and asked appropriate questions regarding home dosage vs hospital dosage. Patient's niece present and reported she feels patient is close to her cognitive baseline, however, patient in agreement with focusing on higher-level problem solving tasks for ongoing cognitive remediation. Patient left upright in wheelchair with alarm on and all needs within reach. Continue with current plan of care.      Pain No/Denies Pain    Agitated Behavior Scale: TBI Observation Details Observation Environment: CIR Start of observation period - Date: 09/03/21 Start of observation period - Time: 1403 End of observation period - Date: 09/03/21 End of observation period - Time: 1445 Agitated Behavior Scale (DO NOT LEAVE BLANKS) Short attention span, easy distractibility, inability to concentrate: Absent Impulsive, impatient, low tolerance for pain or frustration: Absent Uncooperative, resistant to care, demanding: Absent Violent and/or threatening violence toward people or property:  Absent Explosive and/or unpredictable anger: Absent Rocking, rubbing, moaning, or other self-stimulating behavior: Absent Pulling at tubes, restraints, etc.: Absent Wandering from treatment areas: Absent Restlessness, pacing, excessive movement: Absent Repetitive behaviors, motor, and/or verbal: Absent Rapid, loud, or excessive talking: Absent Sudden changes of mood: Absent Easily initiated or excessive crying and/or laughter: Absent Self-abusiveness, physical and/or verbal: Absent Agitated behavior scale total score: 14  Therapy/Group: Individual Therapy  Jennifer Porter 09/03/2021, 3:07 PM

## 2021-09-03 NOTE — Care Management (Signed)
Inpatient [facility] Individual Statement of Services  Patient Name:  Jennifer Porter  Date:  09/03/2021  Welcome to the [facility].  Our goal is to provide you with an individualized program based on your diagnosis and situation, designed to meet your specific needs.  With this comprehensive rehabilitation program, you will be expected to participate in at least 3 hours of rehabilitation therapies Monday-Friday, with modified therapy programming on the weekends.  Your rehabilitation program will include the following services:  Physical Therapy (PT), Occupational Therapy (OT), Speech Therapy (ST), 24 hour per day rehabilitation nursing, Therapeutic Recreaction (TR), Psychology, Neuropsychology, Care Coordinator, Rehabilitation Medicine, [place], and Other  Weekly team conferences will be held on Tuesdays to discuss your progress.  Your Inpatient Rehabilitation Care Coordinator will talk with you frequently to get your input and to update you on team discussions.  Team conferences with you and your family in attendance may also be held.  Expected length of stay: 10-17 days    Overall anticipated outcome: Supervision  Depending on your progress and recovery, your program may change. Your Inpatient Rehabilitation Care Coordinator will coordinate services and will keep you informed of any changes. Your Inpatient Rehabilitation Care Coordinator's name and contact numbers are listed  below.  The following services may also be recommended but are not provided by the [facility] will be made to provide these services after discharge if needed.  Arrangements include referral to agencies that provide these services.  Your insurance has been verified to be:  [facility] Medicare  Your primary  doctor is:  Celene Squibb  Pertinent information will be shared with your doctor and your insurance company.  Inpatient Rehabilitation Care Coordinator:  Cathleen Corti [phone] or [phone]  Information discussed with and copy given to patient by: Rana Snare, 09/03/2021, 9:43 AM

## 2021-09-03 NOTE — IPOC Note (Signed)
Overall Plan of Care [facility]) Patient Details Name: Jennifer Porter MRN: [id number] DOB: 1945-11-06  Admitting Diagnosis: Trauma  Hospital Problems: Principal Problem:   Trauma Active Problems:   Type 2 diabetes mellitus without complication (HCC)   Subdural hematoma (HCC)   Subarachnoid hemorrhage (HCC)   Acute blood loss anemia     Functional Problem List: Nursing Bladder, Edema, Endurance, Medication Management, Pain, Safety, Skin Integrity  PT Balance, Behavior, Endurance, Motor, Pain, Safety  OT Balance, Safety, Edema, Endurance, Motor, Pain  SLP Cognition  TR         Basic ADL's: OT Bathing, Dressing, Toileting     Advanced  ADL's: OT Laundry, Full Meal Preparation     Transfers: PT Bed Mobility, Bed to Chair, Car, Manufacturing systems engineer, Metallurgist: PT Ambulation, Stairs     Additional Impairments: OT Fuctional Use of Upper Extremity  SLP Social Cognition   Problem Solving, Memory  TR      Anticipated Outcomes Item Anticipated Outcome  Self Feeding independent  Swallowing      Basic self-care  supervision  Toileting  supervision   Bathroom Transfers supervision  Bowel/Bladder  supervision  Transfers  Supervision  Locomotion  Supervision  Communication     Cognition  Mod I  Pain  < 3  Safety/Judgment  supervision   Therapy Plan: PT Intensity: Minimum of 1-2 x/day ,45 to 90 minutes PT Frequency: 5 out of 7 days PT Duration Estimated Length of Stay: 10-14 days OT Intensity: Minimum of 1-2 x/day, 45 to 90 minutes OT Frequency: 5 out of 7 days OT Duration/Estimated Length of Stay: 14-17 days SLP Intensity: Minumum of 1-2 x/day, 30 to 90 minutes SLP Frequency: 1 to 3 out of 7 days SLP Duration/Estimated Length of Stay: 14-17 days   Team Interventions: Nursing Interventions Patient/Family Education, Bladder Management, Disease Management/Prevention, Pain Management, Medication Management, Skin Care/Wound Management,  Discharge Planning  PT interventions Ambulation/gait training, Community reintegration, DME/adaptive equipment instruction, Neuromuscular re-education, Psychosocial support, Stair training, UE/LE Strength taining/ROM, Training and development officer, Functional electrical stimulation, Discharge planning, Wheelchair propulsion/positioning, Pain management, Skin care/wound management, Therapeutic Activities, Cognitive remediation/compensation, Disease management/prevention, Visual/perceptual remediation/compensation, Splinting/orthotics, Therapeutic Exercise, Patient/family education, Functional mobility training, UE/LE Coordination activities  OT Interventions Balance/vestibular training, Discharge planning, Pain management, Self Care/advanced ADL retraining, Therapeutic Activities, UE/LE Coordination activities, Therapeutic Exercise, Patient/family education, Functional mobility training, DME/adaptive equipment instruction, Neuromuscular re-education, UE/LE Strength taining/ROM, Psychosocial support, Cognitive remediation/compensation, Community reintegration, Disease mangement/prevention, Functional electrical stimulation, Skin care/wound managment, Splinting/orthotics, Wheelchair propulsion/positioning, Visual/perceptual remediation/compensation  SLP Interventions Cognitive remediation/compensation, Internal/external aids, Cueing hierarchy, Environmental controls, Therapeutic Activities, Functional tasks, Patient/family education  TR Interventions    SW/CM Interventions Discharge Planning, Psychosocial Support, Patient/Family Education   Barriers to Discharge MD  Medical stability  Nursing Decreased caregiver support, Incontinence, Wound Care, Lack of/limited family support, Weight bearing restrictions 1 level, ramped entrance. Spouse on peritoneal dialysis. Can provide 24/7 supervision.  PT      OT Decreased caregiver support, Weight bearing restrictions reports to have great familial support  SLP       SW       Team Discharge Planning: Destination: PT-Home ,OT- Home , SLP-Home Projected Follow-up: PT-Home health PT, OT-  Home health OT, SLP- (TBD) Projected Equipment Needs: PT-To be determined, OT- To be determined, SLP-None recommended by SLP Equipment Details: PT- , OT-will evaluate further Patient/family involved in discharge planning: PT- Patient, Family member/caregiver,  OT-Patient, SLP-Patient  MD ELOS: 14 days Medical Rehab Prognosis:  Excellent Assessment: The patient has been admitted for CIR therapies with the diagnosis of polytrauma with TBI. The team will be addressing functional mobility, strength, stamina, balance, safety, adaptive techniques and equipment, self-care, bowel and bladder mgt, patient and caregiver education, NMR, pain mgt, wb precautions, cognition. Goals have been set at supervision with self-care and mobility and mod I with cognition. Anticipated discharge destination is home.        See Team Conference Notes for weekly updates to the plan of care

## 2021-09-04 LAB — GLUCOSE, CAPILLARY
Glucose-Capillary: 127 mg/dL — ABNORMAL HIGH (ref 70–99)
Glucose-Capillary: 185 mg/dL — ABNORMAL HIGH (ref 70–99)
Glucose-Capillary: 197 mg/dL — ABNORMAL HIGH (ref 70–99)
Glucose-Capillary: 196 mg/dL — ABNORMAL HIGH (ref 70–99)

## 2021-09-04 NOTE — Progress Notes (Signed)
Physical Therapy Session Note  Patient Details  Name: Jennifer Porter MRN: [id number] Date of Birth: 1945-07-02  Today's Date: 09/04/2021 PT Individual Time: 5465-6812 and 1403-1500 PT Individual Time Calculation (min): 72 min and 57 min  Short Term Goals: Week 1:  PT Short Term Goal 1 (Week 1): Pt will complete bed mobility with minA. PT Short Term Goal 2 (Week 1): Pt will complete bed to chair transfer with minA. PT Short Term Goal 3 (Week 1): Pt will ambulate x50' with minA and LRAD. PT Short Term Goal 4 (Week 1): Pt will initiate stair training.  Skilled Therapeutic Interventions/Progress Updates:     1st Session: Pt received seated at EOB and agrees to therapy, though voices frustration that she has not received pain meds or pancreas medicine required to eat breakfast. PT informs RN who brings pain meds for pt's L hip. PT provides rest breaks and mobility to manage pain as well. Pt performs sit to stand form elevated bed with light minA and cues for hand placement and body mechanics. Pt ambulates to toilet with platform RW, x15'. Pt ambulates extremely slowly with step-to, antalgic gait pattern and forward flexed trunk. PT provides multimodal cues to improve posture. Pt requires minA to control stand to sit to elevated toilet with verbal cues for hand placement to assist. PT provides assistance to dress pt while she it seated on toilet, and minA for sit to stand from commode. Pt able to perform urinary pericare while PT provides minA stability at hips and trunk. Pt ambulates x10' to [facility] with L platform RW. WC transport to gym for time management. Pt performs sit to stand with platform RW and minA/modA, then ambulates x50' with extremely slow, antalgic gait pattern, utilizing step-to technique. WC transport back to room. Left seated with all needs within reach.  2nd Session: Pt received seated in recliner and agrees to therapy. Reports pain in L hip. Number not provided. RN provided pt with pain  meds prior to therapy. PT provides rest breaks as needed to manage pain. Sit to stand from recliner with platform RW and modA, with cues for hand placement and body mechanics.Pt ambulates extremely slowly x10' with antalgic, step-to gait pattern. WC transport to gym for time management. Pt ambulates x30' with similar gait pattern, with PT providing cues to increase stance time on L and increase stride length on R to promote increased WB and strengthening of L lower extremity, as well as more functional gait pattern. Following extended seated rest break, pt ambulates additional x75' with similar gait pattern, requiring extended amount of time with same cueing. WC transport back to room. Pt performs stand pivot transfer to bed without AD and requires modA for anterior weights shift and sequencing. ModA for sit to supine and management of bilateral lower extremities. Left supine with alarm intact and all needs within reach.  Therapy Documentation Precautions:  Precautions Precautions: Fall Precaution Comments: LUE wrapped Required Braces or Orthoses: Splint/Cast Splint/Cast: L wrist Restrictions Weight Bearing Restrictions: Yes LUE Weight Bearing: Non weight bearing LLE Weight Bearing: Weight bearing as tolerated   Therapy/Group: Individual Therapy  Breck Coons, PT, DPT 09/04/2021, 4:38 PM

## 2021-09-04 NOTE — Progress Notes (Signed)
Occupational Therapy Session Note  Patient Details  Name: Jennifer Porter MRN: [id number] Date of Birth: [birth year]  Today's Date: 09/04/2021 OT Individual Time: 1005-1100 OT Individual Time Calculation (min): 55 min    Short Term Goals: Week 1:  OT Short Term Goal 1 (Week 1): Pt will complete LB dressing with Mod A. OT Short Term Goal 1 - Progress (Week 1): Progressing toward goal OT Short Term Goal 2 (Week 1): Pt will complete stand pivot toilet transfer with Min A. OT Short Term Goal 2 - Progress (Week 1): Progressing toward goal OT Short Term Goal 3 (Week 1): Pt will complete 1/3 toileting task with Mod A. OT Short Term Goal 3 - Progress (Week 1): Progressing toward goal  Skilled Therapeutic Interventions/Progress Updates:    Pt greeted seated in wc, MD rounding on patient. Pt agreeable to OT treatment session. PT reported she already completed BADLs with earlier PT session. Worked on wc mobility using only R side. Pt needed min/mod A, but will do much better with a shoe on R LE. Pt reported her family is going to bring tennis shoes. Worked on L quad activation with Kinetron on lowest setting. Pt with increased pain and little push from R side. Went through L hand there-ex with focus on thumb opposition. Pt able to get all fingers except for pinky. Pt returned to room and performed stand-pivot to recliner with PFRW and min A> Pt left seated in recliner with LE's elevated, call bell in reach, and needs met.   Therapy Documentation Precautions:  Precautions Precautions: Fall Precaution Comments: LUE wrapped Required Braces or Orthoses: Splint/Cast Splint/Cast: L wrist Restrictions Weight Bearing Restrictions: Yes LUE Weight Bearing: Non weight bearing LLE Weight Bearing: Weight bearing as tolerated  Pain: Pain Assessment Pain Scale: 0-10 Pain Score: 8 Pain Type: Surgical pain Pain Location: Pelvis Pain Orientation: Left Repositioned for pain management   Therapy/Group:  Individual Therapy  Valma Cava 09/04/2021, 10:57 AM

## 2021-09-04 NOTE — Progress Notes (Signed)
PROGRESS NOTE   Subjective/Complaints: Patient had a good day today does report that she does have to premedicate with pain medications before working with PT to be able to tolerate mobility.  ROS: Denies sore throat, fever, rash, blurred vision, dizziness, nausea, vomiting, diarrhea, cough, shortness of breath or chest pain, neck pain, headache or mood changes   Objective:   No results found.  Recent Labs    09/02/21 0519  WBC 6.1  HGB 8.8*  HCT 26.1*  PLT 277   Recent Labs    09/02/21 0519  NA 141  K 3.5  CL 104  CO2 25  GLUCOSE 128*  BUN 18  CREATININE 0.68  CALCIUM 8.9    Intake/Output Summary (Last 24 hours) at 09/04/2021 1807 Last data filed at 09/04/2021 1301 Gross per 24 hour  Intake 500 ml  Output 525 ml  Net -25 ml        Physical Exam: Vital Signs Blood pressure 130/64, pulse 88, temperature 98.4 F (36.9 C), resp. rate 16, height '5\' 4"'$  (1.626 m), weight 59 kg, SpO2 98 %.  Constitutional: Not in acute distress vital signs have been reviewed HEENT: NCAT, EOMI, oral membranes moist Neck: Supple Cardiovascular: RRR without murmur or clicks.  No JVD   Respiratory/Chest: CTA bilaterally without wheezes or rales.  Normal effort   GI/Abdomen: Bowel sounds positive, nontender nondistended Ext: No clubbing cyanosis or edema Psych: Cooperative and pleasant Skin: Clean dry intact without breakdown.  Left hip wound is CDI Neuro: Alert and oriented x3 with reasonable insight and awareness.  Functional memory.  Normal speech and language.  No cranial nerve findings.  RUE 5/5.  LUE 3-4/5 however limited at forearm due to fracture.  LLE 2/5 proximal to 4 -/5 distally Musculoskeletal: Left hip and forearm pain.  Left forearm is wrapped with Ace.  Assessment/Plan: 1. Functional deficits which require 3+ hours per day of interdisciplinary therapy in a comprehensive inpatient rehab setting. Physiatrist is  providing close team supervision and 24 hour management of active medical problems listed below. Physiatrist and rehab team continue to assess barriers to discharge/monitor patient progress toward functional and medical goals  Care Tool:  Bathing    Body parts bathed by patient: Right arm, Left arm, Chest, Abdomen, Face   Body parts bathed by helper: Left arm (helper assisted with L under arm and back) Body parts n/a: Right lower leg, Left lower leg, Buttocks, Right upper leg, Left upper leg (had already washed LB earlier)   Bathing assist Assist Level: Maximal Assistance - Patient 24 - 49%     Upper Body Dressing/Undressing Upper body dressing   What is the patient wearing?: Bra, Pull over shirt    Upper body assist Assist Level: Moderate Assistance - Patient 50 - 74%    Lower Body Dressing/Undressing Lower body dressing      What is the patient wearing?: Pants, Underwear/pull up     Lower body assist Assist for lower body dressing: Maximal Assistance - Patient 25 - 49%     Toileting Toileting    Toileting assist Assist for toileting: 2 Helpers     Transfers Chair/bed transfer  Transfers assist  Chair/bed transfer assist level: Moderate Assistance - Patient 50 - 74%     Locomotion Ambulation   Ambulation assist      Assist level: Moderate Assistance - Patient 50 - 74% Assistive device: Walker-platform Max distance: 20'   Walk 10 feet activity   Assist     Assist level: Moderate Assistance - Patient - 50 - 74% Assistive device: Walker-platform   Walk 50 feet activity   Assist Walk 50 feet with 2 turns activity did not occur: Safety/medical concerns         Walk 150 feet activity   Assist Walk 150 feet activity did not occur: Safety/medical concerns         Walk 10 feet on uneven surface  activity   Assist Walk 10 feet on uneven surfaces activity did not occur: Safety/medical concerns         Wheelchair     Assist  Is the patient using a wheelchair?: No             Wheelchair 50 feet with 2 turns activity    Assist            Wheelchair 150 feet activity     Assist          Blood pressure 130/64, pulse 88, temperature 98.4 F (36.9 C), resp. rate 16, height '5\' 4"'$  (1.626 m), weight 59 kg, SpO2 98 %. Medical Problem List and Plan: 1. Functional deficits secondary to polytrauma with SAH left convexity , small parafalcine SDH, left IT hip fracture and left distal radius fracture             -patient may shower, please cover incisions             -ELOS/Goals: 10 to 14 days, supervision to min asist with PT and OT, mod I to supervision with SLP  -Patient is beginning CIR therapies today including PT, OT, and SLP  2.  Antithrombotics: -DVT/anticoagulation:  Pharmaceutical: Lovenox--Eliquis at d/c.             -antiplatelet therapy: N/A 3. Pain Management: Continue tylenol tid--  650 mg, Gabapentin '300mg'$  HS             -continue robaxin TID with oxycodone prn.   -pt hesitant to use narcotics but now will take an oxycodone 30" prior to her PT sessions 6/17 Has been using oxy IR 5-10 mg prn prior to working with PT 4. Mood/Sleep:  LCSW to follow for evaluation and support.              -antipsychotic agents: N/A 5. Neuropsych/cognition: This patient is capable of making decisions on his own behalf. 6. Skin/Wound Care: Routine pressure relief measures.  7. Fluids/Electrolytes/Nutrition: encourage PO  -hypomagnesia up to 1.7 6/16, s/p 2 runs IVPB Mg++   -continue oral mag   -recheck mag level Monday 6/19 Pending repeat Mag level on Monday  -add protein supp for low albumin 8. Left distal radius Fx s/p ORIF: NWB left wrist but can WBAT thorough left elbow             --continue splint left forearm.  6/17 Continue splint 9. Left IT hip Fx s/p IM nailing: WBAT 10 ABLA:                -HGB 9.2 6/11--holding at 8.8 6/15  -added Fe++ supp  -recheck level Monday 11. T2DM: Hgb A1c-  6.4. Will monitor BS ac/hs and use SSI for elevated BS/wound healing.              --  Resume levemir 5 units at bedtime and monitor BS.  --Continue to hold metformin for now.  -CBG (last 3)  Recent Labs    09/04/21 0611 09/04/21 1138 09/04/21 1608  GLUCAP 127* 185* 197*   PM sugars elevated 6/16 will resume metformin '500mg'$  daily to start (on '1000mg'$  bid at home) 6/17 CBG's ranging 127-197 with 500 mg metformin, consider going up on dose, since at 1,00 mg at home. 12. Small SDH/SAH: No follow up per NS. Monitor for any neurological changes.  13. Vitamin D insufficiency: 24.98-->add ergocalciferol for supplement.  14. Chronic pancreatitis: Followed by Dr. Gala Romney and recent flare 07/2021? --Abdominal pain/diarrhea managed w/creon and prn bentyl.  --  miralax prn.  15. HTN: Monitor BP TID. Continue Lisinopril 20 mg/day.              -BP well controlled 6/16    09/04/2021    1:01 PM 09/04/2021    4:26 AM 09/03/2021    7:16 PM  Vitals with BMI  Systolic [phone]  Diastolic 64 56 48  Pulse 88 83 87   6/17 BP well controlled, continue 20 mg lisinopril daily. 16. Seizure prophylaxis: Keppra bid thorough 06/16.  17. Endstage OA left knee: Followed by Dr. Wynelle Link injection Jan (trying to avoid surgery)             --OA moderate on xray at admit             --Ice prn. Add Voltaren gel tid. observe with activity       LOS: 3 days A FACE TO FACE EVALUATION WAS PERFORMED  Luetta Nutting 09/04/2021, 6:07 PM

## 2021-09-05 LAB — GLUCOSE, CAPILLARY
Glucose-Capillary: 123 mg/dL — ABNORMAL HIGH (ref 70–99)
Glucose-Capillary: 168 mg/dL — ABNORMAL HIGH (ref 70–99)
Glucose-Capillary: 173 mg/dL — ABNORMAL HIGH (ref 70–99)
Glucose-Capillary: 171 mg/dL — ABNORMAL HIGH (ref 70–99)

## 2021-09-05 NOTE — Progress Notes (Signed)
PROGRESS NOTE   Subjective/Complaints: Patient had a bad night last night due to pain that kept her from sleeping.  She reports that she is still hesitant about taking Oxy IR for pain even if needed.   ROS: Denies sore throat, fever, rash, blurred vision, dizziness, nausea, vomiting, diarrhea, cough, shortness of breath or chest pain, neck pain, headache or mood changes. + MSK pain especially Lt FA fx, lt hip, coccyx.   Objective:   No results found.  No results for input(s): "WBC", "HGB", "HCT", "PLT" in the last 72 hours.  No results for input(s): "NA", "K", "CL", "CO2", "GLUCOSE", "BUN", "CREATININE", "CALCIUM" in the last 72 hours.   Intake/Output Summary (Last 24 hours) at 09/05/2021 1313 Last data filed at 09/05/2021 0851 Gross per 24 hour  Intake 720 ml  Output 775 ml  Net -55 ml        Physical Exam: Vital Signs Blood pressure 113/60, pulse 81, temperature 98 F (36.7 C), resp. rate 18, height '5\' 4"'$  (1.626 m), weight 59 kg, SpO2 97 %.  Constitutional: Not in acute distress vital signs have been reviewed HEENT: NCAT, EOMI, oral membranes moist Neck: Supple Cardiovascular: RRR without murmur or clicks.  No JVD   Respiratory/Chest: CTA bilaterally without wheezes or rales.  Normal effort   GI/Abdomen: Bowel sounds positive, nontender nondistended Ext: No clubbing cyanosis or edema Psych: Cooperative and pleasant Skin: Clean dry intact without breakdown.  Left hip wound is CDI Neuro: Alert and oriented x3 with reasonable insight and awareness.  Functional memory.  Normal speech and language.  No cranial nerve findings.  RUE 5/5.  LUE 3-4/5 however limited at forearm due to fracture.  LLE 2/5 proximal to 4 -/5 distally Musculoskeletal: Left hip, coccyx and forearm pain.  Left forearm is wrapped with Ace.  Assessment/Plan: 1. Functional deficits which require 3+ hours per day of interdisciplinary therapy in a  comprehensive inpatient rehab setting. Physiatrist is providing close team supervision and 24 hour management of active medical problems listed below. Physiatrist and rehab team continue to assess barriers to discharge/monitor patient progress toward functional and medical goals  Care Tool:  Bathing    Body parts bathed by patient: Right arm, Left arm, Chest, Abdomen, Face   Body parts bathed by helper: Left arm (helper assisted with L under arm and back) Body parts n/a: Right lower leg, Left lower leg, Buttocks, Right upper leg, Left upper leg (had already washed LB earlier)   Bathing assist Assist Level: Maximal Assistance - Patient 24 - 49%     Upper Body Dressing/Undressing Upper body dressing   What is the patient wearing?: Bra, Pull over shirt    Upper body assist Assist Level: Moderate Assistance - Patient 50 - 74%    Lower Body Dressing/Undressing Lower body dressing      What is the patient wearing?: Pants, Underwear/pull up     Lower body assist Assist for lower body dressing: Maximal Assistance - Patient 25 - 49%     Toileting Toileting    Toileting assist Assist for toileting: 2 Helpers     Transfers Chair/bed transfer  Transfers assist     Chair/bed transfer assist level:  Contact Guard/Touching assist     Locomotion Ambulation   Ambulation assist      Assist level: Moderate Assistance - Patient 50 - 74% Assistive device: Walker-platform Max distance: 20'   Walk 10 feet activity   Assist     Assist level: Moderate Assistance - Patient - 50 - 74% Assistive device: Walker-platform   Walk 50 feet activity   Assist Walk 50 feet with 2 turns activity did not occur: Safety/medical concerns         Walk 150 feet activity   Assist Walk 150 feet activity did not occur: Safety/medical concerns         Walk 10 feet on uneven surface  activity   Assist Walk 10 feet on uneven surfaces activity did not occur: Safety/medical  concerns         Wheelchair     Assist Is the patient using a wheelchair?: No             Wheelchair 50 feet with 2 turns activity    Assist            Wheelchair 150 feet activity     Assist          Blood pressure 113/60, pulse 81, temperature 98 F (36.7 C), resp. rate 18, height '5\' 4"'$  (1.626 m), weight 59 kg, SpO2 97 %. Medical Problem List and Plan: 1. Functional deficits secondary to polytrauma with SAH left convexity , small parafalcine SDH, left IT hip fracture and left distal radius fracture             -patient may shower, please cover incisions             -ELOS/Goals: 10 to 14 days, supervision to min asist with PT and OT, mod I to supervision with SLP  -Patient is beginning CIR therapies today including PT, OT, and SLP  2.  Antithrombotics: -DVT/anticoagulation:  Pharmaceutical: Lovenox--Eliquis at d/c.             -antiplatelet therapy: N/A 3. Pain Management: Continue tylenol tid--  650 mg, Gabapentin '300mg'$  HS             -continue robaxin TID with oxycodone prn.   -pt hesitant to use narcotics but now will take an oxycodone 30" prior to her PT sessions 6/17 Has been using oxy IR 5-10 mg prn prior to working with PT 6/18 Reported poor sleep overnight due to pain but having hesitation to take pain meds 4. Mood/Sleep:  LCSW to follow for evaluation and support.              -antipsychotic agents: N/A 5. Neuropsych/cognition: This patient is capable of making decisions on his own behalf. 6. Skin/Wound Care: Routine pressure relief measures.  7. Fluids/Electrolytes/Nutrition: encourage PO  -hypomagnesia up to 1.7 6/16, s/p 2 runs IVPB Mg++   -continue oral mag   -recheck mag level Monday 6/18 Pending repeat Mag level on Monday  -add protein supp for low albumin 8. Left distal radius Fx s/p ORIF: NWB left wrist but can WBAT thorough left elbow             --continue splint left forearm.  6/18 Continue splint 9. Left IT hip Fx s/p IM  nailing: WBAT 10 ABLA:                -HGB 9.2 6/11--holding at 8.8 6/15  -added Fe++ supp  -recheck level Monday 11. T2DM: Hgb A1c- 6.4. Will monitor BS ac/hs and use  SSI for elevated BS/wound healing.              --Resume levemir 5 units at bedtime and monitor BS.  --Continue to hold metformin for now.  -CBG (last 3)  Recent Labs    09/04/21 2032 09/05/21 0633 09/05/21 1139  GLUCAP 196* 123* 168*   PM sugars elevated 6/16 will resume metformin '500mg'$  daily to start (on '1000mg'$  bid at home) 6/18 CBG's ranging 123-197 over past 24-48 hrs with 500 mg metformin, consider going up on dose, since at 1,00 mg at home. 12. Small SDH/SAH: No follow up per NS. Monitor for any neurological changes.  13. Vitamin D insufficiency: 24.98-->add ergocalciferol for supplement.  14. Chronic pancreatitis: Followed by Dr. Gala Romney and recent flare 07/2021? --Abdominal pain/diarrhea managed w/creon and prn bentyl.  --  miralax prn.  15. HTN: Monitor BP TID. Continue Lisinopril 20 mg/day.              -BP well controlled 6/16    09/05/2021    5:56 AM 09/04/2021    8:32 PM 09/04/2021    1:01 PM  Vitals with BMI  Systolic 161 096 045  Diastolic 60 53 64  Pulse 81 86 88   6/18 BP controlled, continue 20 mg lisinopril daily. 16. Seizure prophylaxis: Keppra bid thorough 06/16.  17. Endstage OA left knee: Followed by Dr. Wynelle Link injection Jan (trying to avoid surgery)             --OA moderate on xray at admit             --Ice prn. Add Voltaren gel tid. observe with activity       LOS: 4 days A FACE TO FACE EVALUATION WAS PERFORMED  Luetta Nutting 09/05/2021, 1:13 PM

## 2021-09-06 LAB — BASIC METABOLIC PANEL
Anion gap: 8 (ref 5–15)
BUN: 38 mg/dL — ABNORMAL HIGH (ref 8–23)
CO2: 27 mmol/L (ref 22–32)
Calcium: 9.2 mg/dL (ref 8.9–10.3)
Chloride: 104 mmol/L (ref 98–111)
Creatinine, Ser: 0.73 mg/dL (ref 0.44–1.00)
GFR, Estimated: >60 mL/min (ref >60)
Glucose, Bld: 116 mg/dL — ABNORMAL HIGH (ref 70–99)
Potassium: 4.3 mmol/L (ref 3.5–5.1)
Sodium: 139 mmol/L (ref 135–145)

## 2021-09-06 LAB — MAGNESIUM: Magnesium: 1.5 mg/dL — ABNORMAL LOW (ref 1.7–2.4)

## 2021-09-06 LAB — CBC
HCT: 25.2 % — ABNORMAL LOW (ref 36.0–46.0)
Hemoglobin: 8.4 g/dL — ABNORMAL LOW (ref 12.0–15.0)
MCH: 31.3 pg (ref 26.0–34.0)
MCHC: 33.3 g/dL (ref 30.0–36.0)
MCV: 94 fL (ref 80.0–100.0)
Platelets: 386 10*3/uL (ref 150–400)
RBC: 2.68 MIL/uL — ABNORMAL LOW (ref 3.87–5.11)
RDW: 14.5 % (ref 11.5–15.5)
WBC: 8.5 10*3/uL (ref 4.0–10.5)
nRBC: 0.2 % (ref 0.0–0.2)

## 2021-09-06 LAB — GLUCOSE, CAPILLARY
Glucose-Capillary: 113 mg/dL — ABNORMAL HIGH (ref 70–99)
Glucose-Capillary: 185 mg/dL — ABNORMAL HIGH (ref 70–99)
Glucose-Capillary: 203 mg/dL — ABNORMAL HIGH (ref 70–99)
Glucose-Capillary: 237 mg/dL — ABNORMAL HIGH (ref 70–99)

## 2021-09-06 MED ORDER — LISINOPRIL 10 MG PO TABS
10.0000 mg | ORAL_TABLET | Freq: Every day | ORAL | Status: DC
  Administered 2021-09-07 – 2021-09-16 (×10): 10 mg via ORAL
  Filled 2021-09-06 (×10): qty 1

## 2021-09-06 MED ORDER — ACETAMINOPHEN 325 MG PO TABS: 650.0000 mg | ORAL_TABLET | Freq: Three times a day (TID) | ORAL | Status: AC

## 2021-09-06 NOTE — Progress Notes (Signed)
Occupational Therapy Session Note  Patient Details  Name: MANETTE DOTO MRN: [id number] Date of Birth: [birth year]  Today's Date: 09/06/2021 OT Individual Time: 1130-1200 OT Individual Time Calculation (min): 30 min    Short Term Goals: Week 1:  OT Short Term Goal 1 (Week 1): Pt will complete LB dressing with Mod A. OT Short Term Goal 1 - Progress (Week 1): Progressing toward goal OT Short Term Goal 2 (Week 1): Pt will complete stand pivot toilet transfer with Min A. OT Short Term Goal 2 - Progress (Week 1): Progressing toward goal OT Short Term Goal 3 (Week 1): Pt will complete 1/3 toileting task with Mod A. OT Short Term Goal 3 - Progress (Week 1): Progressing toward goal  Skilled Therapeutic Interventions/Progress Updates:    S: Pt up in wheelchair upon therapy arrival and agreeable to participate in OT session. No pain reported this session.   Therapist reviewed patient's compliance with provided HEP and patient was able to demonstrate some improvement with thumb to small finger opposition. Therapist integrated ROM exercises into session in order to increase functional mobility and use of her LUE while following Surgeon's precautions.  - Patient tolerated P/ROM of the left thumb MCP and DIP joint, flexion/extension, adduction/abduction, 10X. - A/ROM, left thumb, abduction/adduction, palmar adduction, radial abduction, 10X. Elbow flexion/extension, 10X. Patient was able to touch her left thumb to the side of her small finger at end of session.   P: Continue to work on integrating the LUE during daily tasks to assist as able.    Therapy Documentation Precautions:  Precautions Precautions: Fall Precaution Comments: LUE wrapped Required Braces or Orthoses: Splint/Cast Splint/Cast: L wrist Restrictions Weight Bearing Restrictions: Yes LUE Weight Bearing: Non weight bearing LLE Weight Bearing: Weight bearing as tolerated  Pain:     Therapy/Group: Individual  Therapy  Ailene Ravel, OTR/L,CBIS  Supplemental OT - [place] [facility]  09/06/2021, 7:53 AM

## 2021-09-06 NOTE — Progress Notes (Signed)
RN was called to room c/o of pain left hip 7/10 claims that leg rest dropped down suddenly when Therapist moved the table and jolted her leg down on the floor. RN checked patient's leg Patient's able to move leg  ; Patient was able to transfer from chair to bed slowly. Patient claims she feels much better when she's in bed. Pain med given . Continued to monitor.

## 2021-09-06 NOTE — Progress Notes (Signed)
Occupational Therapy Session Note  Patient Details  Name: Jennifer Porter MRN: [id number] Date of Birth: [birth year]  Today's Date: 09/06/2021 OT Individual Time: 0730-0900 OT Individual Time Calculation (min): 90 min   Short Term Goals: Week 1:  OT Short Term Goal 1 (Week 1): Pt will complete LB dressing with Mod A. OT Short Term Goal 1 - Progress (Week 1): Progressing toward goal OT Short Term Goal 2 (Week 1): Pt will complete stand pivot toilet transfer with Min A. OT Short Term Goal 2 - Progress (Week 1): Progressing toward goal OT Short Term Goal 3 (Week 1): Pt will complete 1/3 toileting task with Mod A. OT Short Term Goal 3 - Progress (Week 1): Progressing toward goal  Skilled Therapeutic Interventions/Progress Updates:    Pt greeted semi-reclined in bed awake and agreeable to OT treatment session. Pt reported being in some pain this morning and stated she needed to use the bathroom and wanted to shower. Covered incisions with water proof dressing at bed level. Practiced bed mobility to the Left today while she has not done before. Pt needed mod A and increased time to bring LLE to EOB, and Mod A to elevate trunk. Pt completed stand-pivot to [facility] using PFRW with mod A to stand and min A to pivot. Pt needed OT assist for clothing management prior to lowering onto commode. Pt with successful BM and voided bladder. Worked on toileting tasks with pt able to hip hike and lean to cleanse bottom in sitting. Pt then ambulated to the bathroom to transfer into shower with PFRW and a lot of extra time. Pt showered from tub bench after IV and L arm covered. Pt needed Min A for UB and Mod A for LB bathing. Min A to wash hair. Stand-pivot out of shower using grab bar and min A. Dressing tasks from wc with mod A to don bra, and min A for shirt. Max A LB dressing and min A to stand with PFRW. She was unable to safely remove unilateral UE to help pull up pants, requiring OT assist. Grooming tasks at the sink w/  set-up A. Pt left seated in wc at the sink to blow dry hair w/ nursing present to administer meds and call bell in lap.   Therapy Documentation Precautions:  Precautions Precautions: Fall Precaution Comments: LUE wrapped Required Braces or Orthoses: Splint/Cast Splint/Cast: L wrist Restrictions Weight Bearing Restrictions: Yes LUE Weight Bearing: Non weight bearing LLE Weight Bearing: Weight bearing as tolerated Pain: Pain Assessment Pain Scale: 0-10 Pain Score: 5  Pain Type: Acute pain Pain Location: Hip Pain Orientation: Left Pain Descriptors / Indicators: Aching Pain Frequency: Intermittent Pain Onset: On-going Pain Intervention(s): Medication (See eMAR), nursing administered pain meds and repositioned LE    Therapy/Group: Individual Therapy  Valma Cava 09/06/2021, 9:44 AM

## 2021-09-06 NOTE — Discharge Instructions (Addendum)
Inpatient Rehab Discharge Instructions  Jennifer Porter Discharge date and time:    Activities/Precautions/ Functional Status: Activity: no lifting, driving, or strenuous exercise till cleared by MD Diet: diabetic diet Wound Care: keep wound clean and dry   Functional status:  ___ No restrictions     ___ Walk up steps independently ___ 24/7 supervision/assistance   ___ Walk up steps with assistance ___ Intermittent supervision/assistance  ___ Bathe/dress independently ___ Walk with walker     ___ Bathe/dress with assistance ___ Walk Independently    ___ Shower independently ___ Walk with assistance    ___ Shower with assistance ___ No alcohol     ___ Return to work/school ________   COMMUNITY REFERRALS UPON DISCHARGE:    Outpatient: PT     OT                 Agency: [facility]     Phone: [phone]             Appointment Date/Time: *please expect follow-up within 7-10 business days. If you have not received follow-up, be sure to contact the site directly. *  Medical Equipment/Items Ordered: 3in1 bedside commode, rolling walker with left platform attachment                                                 Agency/Supplier:[facility] [phone]    Special Instructions: No driving smoking or alcohol  Resume low-dose aspirin 81 mg daily after completion of Eliquis    My questions have been answered and I understand these instructions. I will adhere to these goals and the provided educational materials after my discharge from the hospital.  Patient/Caregiver Signature _______________________________ Date __________  Clinician Signature _______________________________________ Date __________  Please bring this form and your medication list with you to all your follow-up doctor's appointments.

## 2021-09-06 NOTE — Progress Notes (Signed)
Speech Language Pathology TBI Note  Patient Details  Name: Jennifer Porter MRN: [id number] Date of Birth: 1945-10-18  Today's Date: 09/06/2021 SLP Individual Time: 1500-1527 SLP Individual Time Calculation (min): 27 min  Short Term Goals: Week 1: SLP Short Term Goal 1 (Week 1): Patient will demonstrate complex problem solving for functional tasks with Mod I. SLP Short Term Goal 2 (Week 1): Patient will recall new, daily information with Mod I.  Skilled Therapeutic Interventions: Skilled ST treatment focused on cognitive goals. Pt received upright in wheelchair on arrival. Pt engaged in functional discussion regarding current deficits/injuries and participated in anticipatory problem solving re: home scenarios with overall sup A verbal question cues for elaboration. Prior to initiating complex scheduling task, pt shifted weight in w/c which resulted in significant hip discomfort. SLP providing assistance with repositioning L leg rest to more comfortable position. Pt with facial grimacing and gasping with very slight movements. Pt attempted to complete cognitive activity however clearly internally distracted by pain and SLP suggesting to complete at another time due to distractibility and time constraints. Pt in agreement. SLP notified nurse of discomfort. Pt requesting to remain in wheelchair with alarm activated and immediate needs within reach at end of session. Continue per current plan of care.      Pain Pain Assessment Pain Scale: 0-10 Pain Score: 7  Pain Type: Acute pain Pain Location: Hip Pain Orientation: Left Pain Descriptors / Indicators: Aching Pain Frequency: Intermittent Pain Onset: On-going Pain Intervention(s): Medication (See eMAR) Multiple Pain Sites: No  Agitated Behavior Scale: TBI Observation Details Observation Environment: cir Start of observation period - Date: 09/06/21 Start of observation period - Time: 1500 End of observation period - Date: 09/06/21 End of  observation period - Time: 1530 Agitated Behavior Scale (DO NOT LEAVE BLANKS) Short attention span, easy distractibility, inability to concentrate: Absent Impulsive, impatient, low tolerance for pain or frustration: Absent Uncooperative, resistant to care, demanding: Absent Violent and/or threatening violence toward people or property: Absent Explosive and/or unpredictable anger: Absent Rocking, rubbing, moaning, or other self-stimulating behavior: Absent Pulling at tubes, restraints, etc.: Absent Wandering from treatment areas: Absent Restlessness, pacing, excessive movement: Absent Repetitive behaviors, motor, and/or verbal: Absent Rapid, loud, or excessive talking: Absent Sudden changes of mood: Absent Easily initiated or excessive crying and/or laughter: Absent Self-abusiveness, physical and/or verbal: Absent Agitated behavior scale total score: 14  Therapy/Group: Individual Therapy  Jennifer Porter 09/06/2021, 4:03 PM
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
## 2021-09-06 NOTE — Progress Notes (Signed)
Chaplain provided follow up visit to [facility] who was accompanied by her husband and another visitor. Mrs Shor shared that she'd recently experienced an upsetting incident and had to take pain medication reporting this was not a good time for the follow up regarding AD education that she had requested. Chaplain inquired about a better time for follow up and Mrs Schabel shared that she knew she needed to have an AD completed, but was unsure when she would feel up to it. Chaplain assured pt that the decision to complete an Advance Directive is solely her own and provided education about requesting follow up on her preferred timeline or completing the paperwork after discharge. Mrs Szeliga expressed gratitude and stated she would request a follow up visit when she feels ready.  Please page as further needs arise.  Donald Prose. Elyn Peers, M.Div. [facility] Chaplain Pager [phone] Office [phone]

## 2021-09-06 NOTE — Progress Notes (Signed)
Physical Therapy Session Note  Patient Details  Name: Jennifer Porter MRN: [id number] Date of Birth: [birth year]  Today's Date: 09/06/2021 PT Individual Time: 1300-1400 PT Individual Time Calculation (min): 60 min   Short Term Goals: Week 1:  PT Short Term Goal 1 (Week 1): Pt will complete bed mobility with minA. PT Short Term Goal 2 (Week 1): Pt will complete bed to chair transfer with minA. PT Short Term Goal 3 (Week 1): Pt will ambulate x50' with minA and LRAD. PT Short Term Goal 4 (Week 1): Pt will initiate stair training.  Skilled Therapeutic Interventions/Progress Updates: Pt presents sitting in w/c and agreeable to therapy.  Pt wheeled to dayroom for time conservation.  Pt performed seated LE there ex to increase circulation and " warm up."  Pt performed calf raises 3 x 15, LAQ, abd/add w/ AAROM to abd and assist for LAQ on LLE.  Pt transferred sit to stand w/ min A but cues for initiation to improve independence.  Pt amb slowly w/ RW and min Ax 80' including turn to return to seat.  Pt requires verbal cues for proper gait cycle to improve heel-off and heel strike as IC.  Pt then returned to room and amb x 14' into BR w/ min A.  Pt required mod A for clothing management and then min A for toilet transfer.  Pt continent of bowel and bladder, NT to chart.  Pt performed all pericareand then mod A for clothing management.  Pt amb x 10' to sink and stood for hand washing.  Pt amb x 15' w/ RW and min A around bed to w/c.  Pt remained sitting in w/c w/ all needs in reach, spouse present in room.     Therapy Documentation Precautions:  Precautions Precautions: Fall Precaution Comments: LUE wrapped Required Braces or Orthoses: Splint/Cast Splint/Cast: L wrist Restrictions Weight Bearing Restrictions: Yes LUE Weight Bearing: Non weight bearing LLE Weight Bearing: Weight bearing as tolerated General:   Vital Signs: Therapy Vitals Temp: 98.2 F (36.8 C) Temp Source: Oral Pulse Rate:  86 Resp: 18 BP: (!) 112/48 Patient Position (if appropriate): Sitting Oxygen Therapy SpO2: 98 % O2 Device: Room Air Pain:6/10         Therapy/Group: Individual Therapy  [name] 09/06/2021, 2:31 PM

## 2021-09-07 LAB — GLUCOSE, CAPILLARY
Glucose-Capillary: 147 mg/dL — ABNORMAL HIGH (ref 70–99)
Glucose-Capillary: 161 mg/dL — ABNORMAL HIGH (ref 70–99)
Glucose-Capillary: 161 mg/dL — ABNORMAL HIGH (ref 70–99)
Glucose-Capillary: 213 mg/dL — ABNORMAL HIGH (ref 70–99)

## 2021-09-07 MED ORDER — METFORMIN HCL 850 MG PO TABS
850.0000 mg | ORAL_TABLET | Freq: Every day | ORAL | Status: DC
  Administered 2021-09-08 – 2021-09-16 (×9): 850 mg via ORAL
  Filled 2021-09-07 (×9): qty 1

## 2021-09-07 NOTE — Patient Care Conference (Signed)
Inpatient RehabilitationTeam Conference and Plan of Care Update Date: 09/07/2021   Time: 10:39 AM    Patient Name: Jennifer Porter      Medical Record Number: [id number]  Date of Birth: June 24, 1945 Sex: Female         Room/Bed: 4W11C/4W11C-01 Payor Info: Payor: [employer] MEDICARE / Plan: [facility] MEDICARE / Product Type: *No Product type* /    Admit Date/Time:  09/01/2021  5:00 PM  Primary Diagnosis:  Trauma  Hospital Problems: Principal Problem:   Trauma Active Problems:   Type 2 diabetes mellitus without complication ([place])   Subdural hematoma ([place])   Subarachnoid hemorrhage ([place])   Acute blood loss anemia    Expected Discharge Date: Expected Discharge Date: 09/16/21  Team Members Present: Physician leading conference: Dr. Jennye Boroughs Social Worker Present: Loralee Pacas, [place] Nurse Present: Dorthula Nettles, RN PT Present: Tereasa Coop, PT OT Present: Cherylynn Ridges, OT SLP Present: Weston Anna, SLP PPS Coordinator present : Gunnar Fusi, SLP     Current Status/Progress Goal Weekly Team Focus  Bowel/Bladder   Continent  remain continent  assist with toileting   Swallow/Nutrition/ Hydration             ADL's   Max A LB ADLs, Min A UB ADLs, Min A w/ PFRW  Supervision  pain management, sit<>stands, self-care retraining, dc planning, L finger ROM, transfers   Mobility   modA bed mobility, minA transfers, gait up to 17' with minA and L platform RW  supervision  pain tolerance, ambulation, transfers, stair training   Communication             Safety/Cognition/ Behavioral Observations  Supervision-Mod I  Mod I  complex problem solving, recall with use of compensatory strategies   Pain   Managed with current pain regimen  keep pain under control  assess q shift and prn   Skin   surgical incisions left wrist and  maintain skin integrity  assess q shift and prn     Discharge Planning:  Pt will d/c to home with her husband who can provide 24/7 care. PRN  support from various family members.   Team Discussion: No appetite, drinking Ensure. Chronic pancreatitis. Labs stable and pain controlled. Continent B/B, pain controlled, incisions CDI. Discharging home with spouse and other family to assist. Reports lots of pain when moving. Doing better about requesting pain medication.   Patient on target to meet rehab goals: yes, supervision goals. Currently requires lots of time. Using assistive equipment with ADL's. Very slow with ambulation. SLP 3x weekly. Working on higher level problem solving.  *See Care Plan and progress notes for long and short-term goals.   Revisions to Treatment Plan:  Adjusting medications   Teaching Needs: Family education, medication/pain, skin/wound care, transfer/gait training, etc.   Current Barriers to Discharge: Home enviroment access/layout, Wound care, Weight bearing restrictions, and Nutritional means  Possible Resolutions to Barriers: Family education Follow-up therapy Order recommended DME Dietary recommendations     Medical Summary Current Status: Pain control, poor diet, anemia, Diabetes, THN, OA  Barriers to Discharge: Home enviroment access/layout;Medical stability  Barriers to Discharge Comments: Pain control, poor diet, anemia, Diabetes, HTN, OA Possible Resolutions to [employer] Focus: pain meds, encourage PO intake, monitor BMP, follow gluocse and adjust medications for DM, follow BP   Continued Need for Acute Rehabilitation Level of Care: The patient requires daily medical management by a physician with specialized training in physical medicine and rehabilitation for the following reasons: Direction of a  multidisciplinary physical rehabilitation program to maximize functional independence : Yes Medical management of patient stability for increased activity during participation in an intensive rehabilitation regime.: Yes Analysis of laboratory values and/or radiology reports with any  subsequent need for medication adjustment and/or medical intervention. : Yes   I attest that I was present, lead the team conference, and concur with the assessment and plan of the team.   Cristi Loron 09/07/2021, 2:09 PM

## 2021-09-07 NOTE — Progress Notes (Signed)
PROGRESS NOTE   Subjective/Complaints: Poor appetite but drinking ensure. BM yesterday.  ROS: Denies sore throat, fever, rash, blurred vision, dizziness, nausea, vomiting, diarrhea, cough, shortness of breath or chest pain, neck pain, vision changes. + MSK pain especially Lt FA fx, lt hip, coccyx.   Objective:   No results found.  Recent Labs    09/06/21 0610  WBC 8.5  HGB 8.4*  HCT 25.2*  PLT 386     Recent Labs    09/06/21 0610  NA 139  K 4.3  CL 104  CO2 27  GLUCOSE 116*  BUN 38*  CREATININE 0.73  CALCIUM 9.2      Intake/Output Summary (Last 24 hours) at 09/07/2021 1042 Last data filed at 09/07/2021 0759 Gross per 24 hour  Intake 680 ml  Output 1000 ml  Net -320 ml         Physical Exam: Vital Signs Blood pressure 120/62, pulse 80, temperature 98 F (36.7 C), temperature source Oral, resp. rate 16, height '5\' 4"'$  (1.626 m), weight 59 kg, SpO2 97 %.  Gen: no distress, normal appearing HEENT: oral mucosa pink and moist, NCAT Cardio: Reg rate Chest: CTAB, normal effort, normal rate of breathing Abd: soft, non-distended Ext: no edema  Psych: Cooperative and pleasant Skin: Clean dry intact without breakdown.  Left hip wound is CDI Neuro: Alert and oriented x3 with reasonable insight and awareness.  Functional memory.  Normal speech and language.  No cranial nerve findings.  RUE 5/5.  LUE 3-4/5 however limited at forearm due to fracture.  LLE 2/5 proximal to 4 -/5 distally Musculoskeletal: Left hip, coccyx and forearm pain.  Left forearm is wrapped with Ace.  Assessment/Plan: 1. Functional deficits which require 3+ hours per day of interdisciplinary therapy in a comprehensive inpatient rehab setting. Physiatrist is providing close team supervision and 24 hour management of active medical problems listed below. Physiatrist and rehab team continue to assess barriers to discharge/monitor patient  progress toward functional and medical goals  Care Tool:  Bathing    Body parts bathed by patient: Right arm, Left arm, Chest, Abdomen, Face   Body parts bathed by helper: Left arm (helper assisted with L under arm and back) Body parts n/a: Right lower leg, Left lower leg, Buttocks, Right upper leg, Left upper leg (had already washed LB earlier)   Bathing assist Assist Level: Maximal Assistance - Patient 24 - 49%     Upper Body Dressing/Undressing Upper body dressing   What is the patient wearing?: Bra, Pull over shirt    Upper body assist Assist Level: Moderate Assistance - Patient 50 - 74%    Lower Body Dressing/Undressing Lower body dressing      What is the patient wearing?: Pants, Underwear/pull up     Lower body assist Assist for lower body dressing: Maximal Assistance - Patient 25 - 49%     Toileting Toileting    Toileting assist Assist for toileting: Moderate Assistance - Patient 50 - 74%     Transfers Chair/bed transfer  Transfers assist     Chair/bed transfer assist level: Contact Guard/Touching assist     Locomotion Ambulation   Ambulation assist  Assist level: Minimal Assistance - Patient > 75% Assistive device: Walker-platform Max distance: 80   Walk 10 feet activity   Assist     Assist level: Minimal Assistance - Patient > 75% Assistive device: Walker-platform   Walk 50 feet activity   Assist Walk 50 feet with 2 turns activity did not occur: Safety/medical concerns  Assist level: Minimal Assistance - Patient > 75% Assistive device: Walker-platform    Walk 150 feet activity   Assist Walk 150 feet activity did not occur: Safety/medical concerns         Walk 10 feet on uneven surface  activity   Assist Walk 10 feet on uneven surfaces activity did not occur: Safety/medical concerns         Wheelchair     Assist Is the patient using a wheelchair?: No             Wheelchair 50 feet with 2 turns  activity    Assist            Wheelchair 150 feet activity     Assist          Blood pressure 120/62, pulse 80, temperature 98 F (36.7 C), temperature source Oral, resp. rate 16, height '5\' 4"'$  (1.626 m), weight 59 kg, SpO2 97 %. Medical Problem List and Plan: 1. Functional deficits secondary to polytrauma with SAH left convexity , small parafalcine SDH, left IT hip fracture and left distal radius fracture             -patient may shower, please cover incisions             -ELOS/Goals: 10 to 14 days, supervision to min asist with PT and OT, mod I to supervision with SLP  -Continue CIR therapies today including PT, OT, and SLP   -Team conference today 2.  Antithrombotics: -DVT/anticoagulation:  Pharmaceutical: Lovenox--Eliquis at d/c.             -antiplatelet therapy: N/A 3. Pain Management: Continue tylenol tid--  650 mg, Gabapentin '300mg'$  HS             -continue robaxin TID with oxycodone prn.   -pt hesitant to use narcotics but now will take an oxycodone 30" prior to her PT sessions 6/17 Has been using oxy IR 5-10 mg prn prior to working with PT 6/18 Reported poor sleep overnight due to pain but having hesitation to take pain meds 4. Mood/Sleep:  LCSW to follow for evaluation and support.              -antipsychotic agents: N/A 5. Neuropsych/cognition: This patient is capable of making decisions on his own behalf. 6. Skin/Wound Care: Routine pressure relief measures.  7. Fluids/Electrolytes/Nutrition: encourage PO  -hypomagnesia up to 1.7 6/16, s/p 2 runs IVPB Mg++   -continue oral mag   -recheck mag level Monday 6/20 MG was 1.5 on 6/19 , discussed with pharmacy, continue supplementation, repeat labs  -add protein supp for low albumin 8. Left distal radius Fx s/p ORIF: NWB left wrist but can WBAT thorough left elbow             --continue splint left forearm.  6/18 Continue splint 9. Left IT hip Fx s/p IM nailing: WBAT 10 ABLA:                -HGB 9.2  6/11--holding at 8.8 6/15  -added Fe++ supp  -HGB 8.4 6/19, recheck CBC 11. T2DM: Hgb A1c- 6.4. Will monitor BS ac/hs and use  SSI for elevated BS/wound healing.              --Resume levemir 5 units at bedtime and monitor BS.  --Continue to hold metformin for now.  -CBG (last 3)  Recent Labs    09/06/21 1625 09/06/21 2116 09/07/21 0545  GLUCAP 203* 237* 147*    PM sugars elevated 6/16 will resume metformin '500mg'$  daily to start (on '1000mg'$  bid at home) 6/18 CBG's ranging 123-197 over past 24-48 hrs with 500 mg metformin, consider going up on dose, since at 1,00 mg at home. 6/20 Increase metformin to '850mg'$  12. Small SDH/SAH: No follow up per NS. Monitor for any neurological changes.  13. Vitamin D insufficiency: 24.98-->add ergocalciferol for supplement.  14. Chronic pancreatitis: Followed by Dr. Gala Romney and recent flare 07/2021? --Abdominal pain/diarrhea managed w/creon and prn bentyl.  --  miralax prn.  15. HTN: Monitor BP TID. Decrease Lisinopril to '10mg'$     09/07/2021    5:12 AM 09/06/2021    7:27 PM 09/06/2021    2:11 PM  Vitals with BMI  Systolic [phone]  Diastolic 62 54 48  Pulse 80 89 86   6/20 BP well controlled, continue lisinopril '10mg'$  16. Seizure prophylaxis: completed keppra course 17. Endstage OA left knee: Followed by Dr. Wynelle Link injection Jan (trying to avoid surgery)             --OA moderate on xray at admit             --Ice prn. Conitnue Voltaren gel tid. observe with activity       LOS: 6 days A FACE TO FACE EVALUATION WAS PERFORMED  Jennye Boroughs 09/07/2021, 10:42 AM

## 2021-09-07 NOTE — Progress Notes (Signed)
Physical Therapy Session Note  Patient Details  Name: Jennifer Porter MRN: [id number] Date of Birth: [birth year]  Today's Date: 09/07/2021 PT Individual Time: 3329-5188 PT Individual Time Calculation (min): 71 min   Short Term Goals: Week 1:  PT Short Term Goal 1 (Week 1): Pt will complete bed mobility with minA. PT Short Term Goal 2 (Week 1): Pt will complete bed to chair transfer with minA. PT Short Term Goal 3 (Week 1): Pt will ambulate x50' with minA and LRAD. PT Short Term Goal 4 (Week 1): Pt will initiate stair training.  Skilled Therapeutic Interventions/Progress Updates:     Pt received seated in [facility] and agrees to therapy. Reports pain in L hip. Number not provided. PT provides rest breaks as needed to manage pain. Pt also reports having taken pain meds prior to therapy. WC transport to gym for time management. ModA required for stand step/pivot transfer to Nustep without AD and with multimodal cueing for initiation, sequencing, posture, and positioning. Pt completes Nustep for strength and endurance training as well as increasing comfort in L hip through gentle ROM. Pt completes x16:00 at workload of 5 with average steps per minute ~25. PT provides cues to complete full ROM for maximal joint mobilization in L hip.  Pt attempts ambulation but noted to be spending very little time in stance on L, rushing to offload of L lower extremity. Ambulation 3' prior to PT cueing pt to take rest break. Activity adjusted and pt instead performs repeated toe taps with R lower extremity on 3 inch step to promote increased WB and strengthening through L lower extremity. Following seated rest break, activity is progressed using 5 inch step. Pt completes x5 with CGA and cues for increased hip extension to improve posture and mechanics. Following seated rest break pt completes x5 additional toe taps on 5" step.  Pt ambulates x100' back to room with L platform RW and cues to increase R stride length and time  in swing phase in order to promote increased WB through L lower extremity and more functional gait pattern. Cues provided for posture and increased hip extension to improved balance. Sit to supine with minA and cues for sequencing. Left supine with alarm intact and all needs within reach.    Therapy Documentation Precautions:  Precautions Precautions: Fall Precaution Comments: LUE wrapped Required Braces or Orthoses: Splint/Cast Splint/Cast: L wrist Restrictions Weight Bearing Restrictions: Yes LUE Weight Bearing: Non weight bearing LLE Weight Bearing: Weight bearing as tolerated   Therapy/Group: Individual Therapy  Breck Coons, PT, DPT 09/07/2021, 5:53 PM

## 2021-09-07 NOTE — Progress Notes (Signed)
Patient ID: Jennifer Porter, female   DOB: [birth year], 76 y.o.   MRN: [id number]  SW met with pt in room to provide updates from team conference, and d/c date 6/29. Pt aware SW will follow-up with her husband.  Loralee Pacas, MSW, [place] Office: [phone] Cell: [phone] Fax: [phone]

## 2021-09-07 NOTE — Progress Notes (Signed)
Occupational Therapy Session Note  Patient Details  Name: Jennifer Porter MRN: [id number] Date of Birth: 11/08/1945  Today's Date: 09/07/2021 Session 1 OT Individual Time: 0834-0945 OT Individual Time Calculation (min): 71 min   Session 2 OT Individual Time: 1102-1202 OT Individual Time Calculation (min): 60 min    Short Term Goals: Week 1:  OT Short Term Goal 1 (Week 1): Pt will complete LB dressing with Mod A. OT Short Term Goal 1 - Progress (Week 1): Progressing toward goal OT Short Term Goal 2 (Week 1): Pt will complete stand pivot toilet transfer with Min A. OT Short Term Goal 2 - Progress (Week 1): Progressing toward goal OT Short Term Goal 3 (Week 1): Pt will complete 1/3 toileting task with Mod A. OT Short Term Goal 3 - Progress (Week 1): Progressing toward goal  Skilled Therapeutic Interventions/Progress Updates:  Session 1   Pt greeted seated in wc and agreeable to OT treatment session. Pt reported need to go to the bathroom . Functional ambulation in room to bathroom with PFRW and CGA, but min A to stand. Pt needed extra time with cues to take larger steps. Pt very guarded with L LE stating it is more sore today since it dropped down from the leg rest yesterday. Pt with successful void of bladder and BM. Worked on standing balance/endurance while washing buttocks and peri-area in standing with only set-up A today! Pt ambulated out of bathroom in similar fashion. UB bathing/dressing from wc at the sink with set-up A and mod A to don bra. OT issued reacher and educated on technique for threading pants and brief. Pt demonstrated understanding and needed min A from OT to thread them. She then stood with min A and was able to take R UE from RW to help pull pant sup R side, but needed OT assist for L side. OT educated on technique to don TED hose for edema management and circulation. Pt left seated in wc with call bell in reach and needs met.   Session 2 Pt greeted seated in wc and  agreeable to OT treatment session. OT assisted with donning shoes with mod A. OT reviewed technique for wc propulsion using only R side. Pt with improved propulsion today needing intermittent min A to propel 75 feet. Pt ambulated in therapy gym 30 ft x 2 with PFRW and focus on increased step length and speed. LB there-ex, 3 sets of 10 seated hip flexion toe taps, seated hip adduction, then standing toe taps. Pt returned to room and left seated in wc with needs met awaiting lunch.   Therapy Documentation Precautions:  Precautions Precautions: Fall Precaution Comments: LUE wrapped Required Braces or Orthoses: Splint/Cast Splint/Cast: L wrist Restrictions Weight Bearing Restrictions: Yes LUE Weight Bearing: Non weight bearing LLE Weight Bearing: Weight bearing as tolerated Pain:  Denies pain  Therapy/Group: Individual Therapy   S  09/07/2021, 12:31 PM 

## 2021-09-08 DIAGNOSIS — D649 Anemia, unspecified: Secondary | ICD-10-CM

## 2021-09-08 DIAGNOSIS — R7989 Other specified abnormal findings of blood chemistry: Secondary | ICD-10-CM

## 2021-09-08 LAB — BASIC METABOLIC PANEL
Anion gap: 9 (ref 5–15)
BUN: 31 mg/dL — ABNORMAL HIGH (ref 8–23)
CO2: 25 mmol/L (ref 22–32)
Calcium: 9.5 mg/dL (ref 8.9–10.3)
Chloride: 106 mmol/L (ref 98–111)
Creatinine, Ser: 0.6 mg/dL (ref 0.44–1.00)
GFR, Estimated: >60 mL/min (ref >60)
Glucose, Bld: 106 mg/dL — ABNORMAL HIGH (ref 70–99)
Potassium: 4.3 mmol/L (ref 3.5–5.1)
Sodium: 140 mmol/L (ref 135–145)

## 2021-09-08 LAB — GLUCOSE, CAPILLARY
Glucose-Capillary: 113 mg/dL — ABNORMAL HIGH (ref 70–99)
Glucose-Capillary: 130 mg/dL — ABNORMAL HIGH (ref 70–99)
Glucose-Capillary: 207 mg/dL — ABNORMAL HIGH (ref 70–99)
Glucose-Capillary: 152 mg/dL — ABNORMAL HIGH (ref 70–99)

## 2021-09-08 LAB — CBC
HCT: 27 % — ABNORMAL LOW (ref 36.0–46.0)
Hemoglobin: 8.9 g/dL — ABNORMAL LOW (ref 12.0–15.0)
MCH: 31.3 pg (ref 26.0–34.0)
MCHC: 33 g/dL (ref 30.0–36.0)
MCV: 95.1 fL (ref 80.0–100.0)
Platelets: 403 10*3/uL — ABNORMAL HIGH (ref 150–400)
RBC: 2.84 MIL/uL — ABNORMAL LOW (ref 3.87–5.11)
RDW: 14.8 % (ref 11.5–15.5)
WBC: 7.4 10*3/uL (ref 4.0–10.5)
nRBC: 0 % (ref 0.0–0.2)

## 2021-09-08 LAB — MAGNESIUM: Magnesium: 1.3 mg/dL — ABNORMAL LOW (ref 1.7–2.4)

## 2021-09-08 MED ORDER — SODIUM CHLORIDE 0.9 % IV SOLN
INTRAVENOUS | Status: DC | PRN
  Administered 2021-09-08: 250 mL via INTRAVENOUS

## 2021-09-08 MED ORDER — MAGNESIUM SULFATE IN D5W 1-5 GM/100ML-% IV SOLN
1.0000 g | Freq: Two times a day (BID) | INTRAVENOUS | Status: DC
  Administered 2021-09-08 – 2021-09-09 (×3): 1 g via INTRAVENOUS
  Filled 2021-09-08 (×4): qty 100

## 2021-09-08 NOTE — Progress Notes (Signed)
Occupational Therapy Session Note  Patient Details  Name: Jennifer Porter MRN: [id number] Date of Birth: 1946-03-14  Today's Date: 09/08/2021 OT Individual Time: 4353-9122 OT Individual Time Calculation (min): 70 min    Short Term Goals: Week 1:  OT Short Term Goal 1 (Week 1): Pt will complete LB dressing with Mod A. OT Short Term Goal 1 - Progress (Week 1): Progressing toward goal OT Short Term Goal 2 (Week 1): Pt will complete stand pivot toilet transfer with Min A. OT Short Term Goal 2 - Progress (Week 1): Progressing toward goal OT Short Term Goal 3 (Week 1): Pt will complete 1/3 toileting task with Mod A. OT Short Term Goal 3 - Progress (Week 1): Progressing toward goal  Skilled Therapeutic Interventions/Progress Updates:    Pt received sitting EOB reading a book at the table and agreeable to OT session. Pt requesting to use the restroom and get dressed/washed up for the day. Pt completed funcitonal mobiltiy to the bathroom with PFRW at [place] A overall. Therapist providing cues to separate feet to widen base of support to increase balance and weight shifting between LE. Able to void on the toilet. Required Min A to wash peri-area d/t feeling of balance. Completed functional ambulation to sink with PFRW and completed oral care seated in w/c at the sink with set-up assist. Able to manipulate toothbrush/paste/deodorant. Pt completed UB dressing with (S) and LB dressing with Max A to thread pants and pull up over bottom. Pt completed functional mobility to therapy gym for 30f and Min A overall for stability and cues to bend LE and widen gait stance. In day gym, pt stood with PFRW to increase strength and hip flexion by tapping 2 circles on the floor to facilitate weight shifts and maintain balance for 2 sets of 10x. Transported back to room. Pt left seated in w/c with alarm on, call bell in reach, and all needs met.  Therapy Documentation Precautions:  Precautions Precautions: Fall Precaution  Comments: LUE wrapped Required Braces or Orthoses: Splint/Cast Splint/Cast: L wrist Restrictions Weight Bearing Restrictions: Yes LUE Weight Bearing: Non weight bearing LLE Weight Bearing: Weight bearing as tolerated   Therapy/Group: Individual Therapy  LCatalina Lunger6/21/2023, 7:26 AM

## 2021-09-08 NOTE — Progress Notes (Signed)
Physical Therapy Session Note  Patient Details  Name: Jennifer Porter MRN: [id number] Date of Birth: 1945-03-29  Today's Date: 09/08/2021 PT Individual Time: 4481-8563 PT Individual Time Calculation (min): 72 min   Short Term Goals: Week 1:  PT Short Term Goal 1 (Week 1): Pt will complete bed mobility with minA. PT Short Term Goal 2 (Week 1): Pt will complete bed to chair transfer with minA. PT Short Term Goal 3 (Week 1): Pt will ambulate x50' with minA and LRAD. PT Short Term Goal 4 (Week 1): Pt will initiate stair training.  Skilled Therapeutic Interventions/Progress Updates:     Pt received seated in [facility] and agrees to therapy. Reports pain in L hip but not as bad  as the day before. PT provides rest breaks as needed to manage pain, as well as gentle mobility. WC transport to gym for time management. Pt performs stand step transfer to Nustep with L platform RW and minA, with cues for sequencing and hand placement, as well as anterior trunk lean for sit to stand. Pt completes Nustep to assist with ROM in L lower extremity as ewll as strengthening and endurance training. Pt completes at workload of 5 with average steps per minute 35-40 and completing full ROM. X15:00 total with emphasis on bilateral lower extremity for final 7:00 and less use of R arm.   Pt attempts squat pivot from Nustep to [facility], with idea that it would be a simpler transfer than stand step with platform RW. Pt does not grasp concept of the scoot transfer, however, and instead performs a stand pivot transfer with HHA, and requires modA and significantly increased time, with notable fear of falling and difficulty sequencing.  Pt performs stand step transfer to mat with L platform RW and minA, with cues for sequencing. Sit to supine with modA and cues for body mechanics. Pt completes supine therex for strengthening and ROM. X10 Ankle pumps, quad and glute sets, 2x10 SAQs, and 1x10 hip abduction. PT provides verbal and tactile cues  for optimal muscle contraction and performance.  Supine to sit with modA and cues for sequencing. Stand step transfer from mat>WC>recliner with minA and cues for positioning. Left seated with alarm intact and all needs within reach.  Therapy Documentation Precautions:  Precautions Precautions: Fall Precaution Comments: LUE wrapped Required Braces or Orthoses: Splint/Cast Splint/Cast: L wrist Restrictions Weight Bearing Restrictions: Yes LUE Weight Bearing: Non weight bearing LLE Weight Bearing: Weight bearing as tolerated   Therapy/Group: Individual Therapy  Breck Coons, PT, DPT 09/08/2021, 5:18 PM

## 2021-09-08 NOTE — Progress Notes (Addendum)
Speech Language Pathology Weekly Progress and Session Note  Patient Details  Name: Jennifer Porter MRN: [id number] Date of Birth: 07/25/1945  Beginning of progress report period: September 01, 2021 End of progress report period: September 08, 2021  Today's Date: 09/08/2021 SLP Individual Time: 1330-1415 SLP Individual Time Calculation (min): 45 min  Short Term Goals: Week 1: SLP Short Term Goal 1 (Week 1): Patient will demonstrate complex problem solving for functional tasks with Mod I. SLP Short Term Goal 1 - Progress (Week 1): Not met SLP Short Term Goal 2 (Week 1): Patient will recall new, daily information with Mod I. SLP Short Term Goal 2 - Progress (Week 1): Met    New Short Term Goals: Week 2: SLP Short Term Goal 1 (Week 2): Patient will demonstrate complex problem solving for functional tasks with Mod I.  Weekly Progress Updates: Patient has made functional gains and has met 1 of 2 STGs this reporting period. Currently, patient is overall Mod I for recall of functional information but requires intermittent supervision level verbal cues for complex problem solving. Patient education ongoing. Patient would benefit from continued skilled SLP intervention to maximize her cognitive functioning and overall functional independence prior to discharge.     Intensity: Minumum of 1-2 x/day, 30 to 90 minutes Frequency: 1 to 3 out of 7 days Duration/Length of Stay: 6/29 Treatment/Interventions: Cognitive remediation/compensation;Internal/external aids;Cueing hierarchy;Environmental controls;Therapeutic Activities;Functional tasks;Patient/family education   Daily Session  Skilled Therapeutic Interventions:  Skilled treatment session focused on cognitive goals. Upon arrival, patient was asleep in bed but easily awakened. Patient requested to void but declined the BSC and wanted to use the female urinal in standing. Patient completed task with extra time. SLP also facilitated session by providing  overall supervision level verbal cues for complex problem solving and organization during a complex scheduling task. Patient was overall Mod I for recall of functional information regarding events from previous therapy sessions. Patient left upright in recliner with alarm on and all needs within reach. Continue with current plan of care.     Pain No/Denies Pain   Therapy/Group: Individual Therapy  ,  09/08/2021, 3:23 PM       

## 2021-09-08 NOTE — Progress Notes (Signed)
PROGRESS NOTE   Subjective/Complaints: Reports better sleep last night.  Eating most her meals.   ROS: Denies sore throat, fever, rash, blurred vision, dizziness, nausea, vomiting, diarrhea, cough, shortness of breath or chest pain, neck pain, vision changes. + MSK pain especially Lt FA fx, lt hip, coccyx.   Objective:   No results found.  Recent Labs    09/06/21 0610 09/08/21 0640  WBC 8.5 7.4  HGB 8.4* 8.9*  HCT 25.2* 27.0*  PLT 386 403*     Recent Labs    09/06/21 0610 09/08/21 0640  NA 139 140  K 4.3 4.3  CL 104 106  CO2 27 25  GLUCOSE 116* 106*  BUN 38* 31*  CREATININE 0.73 0.60  CALCIUM 9.2 9.5      Intake/Output Summary (Last 24 hours) at 09/08/2021 0806 Last data filed at 09/08/2021 0205 Gross per 24 hour  Intake 360 ml  Output 700 ml  Net -340 ml         Physical Exam: Vital Signs Blood pressure (!) 120/56, pulse 83, temperature (!) 97.4 F (36.3 C), temperature source Oral, resp. rate 15, height '5\' 4"'$  (1.626 m), weight 59 kg, SpO2 99 %.  Gen: no distress, normal appearing, sitting in chair appears comfortable HEENT: oral mucosa pink and moist, NCAT Cardio: Reg rate Chest: CTAB, normal effort, normal rate of breathing Abd: soft, non-distended, NT Ext: no edema  Psych: Cooperative and pleasant Skin: Clean dry intact without breakdown.  Left hip wound is CDI Neuro: Alert and oriented x3 with reasonable insight and awareness.  Functional memory.  Normal speech and language.  No cranial nerve findings.  RUE 5/5.  LUE 3-4/5 however limited at forearm due to fracture.  LLE 2/5 proximal to 4 -/5 distally Musculoskeletal: Left hip, coccyx and forearm pain.  Left forearm is wrapped with Ace.  Assessment/Plan: 1. Functional deficits which require 3+ hours per day of interdisciplinary therapy in a comprehensive inpatient rehab setting. Physiatrist is providing close team supervision and 24 hour  management of active medical problems listed below. Physiatrist and rehab team continue to assess barriers to discharge/monitor patient progress toward functional and medical goals  Care Tool:  Bathing    Body parts bathed by patient: Right arm, Left arm, Chest, Abdomen, Face   Body parts bathed by helper: Left arm (helper assisted with L under arm and back) Body parts n/a: Right lower leg, Left lower leg, Buttocks, Right upper leg, Left upper leg (had already washed LB earlier)   Bathing assist Assist Level: Maximal Assistance - Patient 24 - 49%     Upper Body Dressing/Undressing Upper body dressing   What is the patient wearing?: Bra, Pull over shirt    Upper body assist Assist Level: Moderate Assistance - Patient 50 - 74%    Lower Body Dressing/Undressing Lower body dressing      What is the patient wearing?: Pants, Underwear/pull up     Lower body assist Assist for lower body dressing: Maximal Assistance - Patient 25 - 49%     Toileting Toileting    Toileting assist Assist for toileting: Moderate Assistance - Patient 50 - 74%     Transfers Chair/bed  transfer  Transfers assist     Chair/bed transfer assist level: Contact Guard/Touching assist     Locomotion Ambulation   Ambulation assist      Assist level: Minimal Assistance - Patient > 75% Assistive device: Walker-platform Max distance: 80   Walk 10 feet activity   Assist     Assist level: Minimal Assistance - Patient > 75% Assistive device: Walker-platform   Walk 50 feet activity   Assist Walk 50 feet with 2 turns activity did not occur: Safety/medical concerns  Assist level: Minimal Assistance - Patient > 75% Assistive device: Walker-platform    Walk 150 feet activity   Assist Walk 150 feet activity did not occur: Safety/medical concerns         Walk 10 feet on uneven surface  activity   Assist Walk 10 feet on uneven surfaces activity did not occur: Safety/medical  concerns         Wheelchair     Assist Is the patient using a wheelchair?: No             Wheelchair 50 feet with 2 turns activity    Assist            Wheelchair 150 feet activity     Assist          Blood pressure (!) 120/56, pulse 83, temperature (!) 97.4 F (36.3 C), temperature source Oral, resp. rate 15, height '5\' 4"'$  (1.626 m), weight 59 kg, SpO2 99 %. Medical Problem List and Plan: 1. Functional deficits secondary to polytrauma with SAH left convexity , small parafalcine SDH, left IT hip fracture and left distal radius fracture             -patient may shower, please cover incisions             -ELOS/Goals: 10 to 14 days, supervision to min asist with PT and OT, mod I to supervision with SLP  -Continue CIR therapies today including PT, OT, and SLP   -09/16/21 expected discharged, Mod 1 to sup 2.  Antithrombotics: -DVT/anticoagulation:  Pharmaceutical: Lovenox--Eliquis at d/c.             -antiplatelet therapy: N/A 3. Pain Management: Continue tylenol tid--  650 mg, Gabapentin '300mg'$  HS             -continue robaxin TID with oxycodone prn.   -pt hesitant to use narcotics but now will take an oxycodone 30" prior to her PT sessions 6/17 Has been using oxy IR 5-10 mg prn prior to working with PT 6/18 Reported poor sleep overnight due to pain but having hesitation to take pain meds 4. Mood/Sleep:  LCSW to follow for evaluation and support.              -antipsychotic agents: N/A 5. Neuropsych/cognition: This patient is capable of making decisions on his own behalf. 6. Skin/Wound Care: Routine pressure relief measures.  7. Fluids/Electrolytes/Nutrition: encourage PO  -hypomagnesia up to 1.7 6/16, s/p 2 runs IVPB Mg++   -continue oral mag   -recheck mag level Monday 6/20 MG was 1.5 on 6/19 , discussed with pharmacy, continue supplementation, repeat labs 6/21 mg Mg low at 1.3, will replete with 1g IV for 2 days  -add protein supp for low albumin 8.  Left distal radius Fx s/p ORIF: NWB left wrist but can WBAT thorough left elbow             --continue splint left forearm.  6/18 Continue splint 9. Left IT  hip Fx s/p IM nailing: WBAT 10 ABLA:                -HGB 9.2 6/11--holding at 8.8 6/15  -added Fe++ supp  -HGB 8.4 6/19, recheck CBC  -6/21 HBG up to 8.9, monitor 11. T2DM: Hgb A1c- 6.4. Will monitor BS ac/hs and use SSI for elevated BS/wound healing.              --Resume levemir 5 units at bedtime and monitor BS.  --Continue to hold metformin for now.  -CBG (last 3)  Recent Labs    09/07/21 1621 09/07/21 2110 09/08/21 0615  GLUCAP 161* 213* 113*    PM sugars elevated 6/16 will resume metformin '500mg'$  daily to start (on '1000mg'$  bid at home) 6/18 CBG's ranging 123-197 over past 24-48 hrs with 500 mg metformin, consider going up on dose, since at 1,00 mg at home. 6/20 Increase metformin to '850mg'$  6/21 glucose improved this AM, monitor trend 12. Small SDH/SAH: No follow up per NS. Monitor for any neurological changes.  13. Vitamin D insufficiency: 24.98-->add ergocalciferol for supplement.  14. Chronic pancreatitis: Followed by Dr. Gala Romney and recent flare 07/2021? --Abdominal pain/diarrhea managed w/creon and prn bentyl.  --  miralax prn.  15. HTN: Monitor BP TID. Decrease Lisinopril to '10mg'$     09/08/2021    5:39 AM 09/07/2021    7:30 PM 09/07/2021    3:20 PM  Vitals with BMI  Systolic 700 174 944  Diastolic 56 57 57  Pulse 83 92 84   6/20 BP well controlled, continue lisinopril '10mg'$  16. Seizure prophylaxis: completed keppra course 17. Endstage OA left knee: Followed by Dr. Wynelle Link injection Jan (trying to avoid surgery)             --OA moderate on xray at admit             --Ice prn. Conitnue Voltaren gel tid. observe with activity  18. Azotemia  -BUN elevated at 31, advised drink fluids      LOS: 7 days A FACE TO FACE EVALUATION WAS PERFORMED  Jennye Boroughs 09/08/2021, 8:06 AM

## 2021-09-09 DIAGNOSIS — M25559 Pain in unspecified hip: Secondary | ICD-10-CM

## 2021-09-09 LAB — BASIC METABOLIC PANEL
Anion gap: 9 (ref 5–15)
BUN: 31 mg/dL — ABNORMAL HIGH (ref 8–23)
CO2: 26 mmol/L (ref 22–32)
Calcium: 9.3 mg/dL (ref 8.9–10.3)
Chloride: 105 mmol/L (ref 98–111)
Creatinine, Ser: 0.59 mg/dL (ref 0.44–1.00)
GFR, Estimated: >60 mL/min (ref >60)
Glucose, Bld: 112 mg/dL — ABNORMAL HIGH (ref 70–99)
Potassium: 4.1 mmol/L (ref 3.5–5.1)
Sodium: 140 mmol/L (ref 135–145)

## 2021-09-09 LAB — GLUCOSE, CAPILLARY
Glucose-Capillary: 104 mg/dL — ABNORMAL HIGH (ref 70–99)
Glucose-Capillary: 121 mg/dL — ABNORMAL HIGH (ref 70–99)
Glucose-Capillary: 160 mg/dL — ABNORMAL HIGH (ref 70–99)
Glucose-Capillary: 175 mg/dL — ABNORMAL HIGH (ref 70–99)

## 2021-09-09 NOTE — Progress Notes (Signed)
Patient ID: Jennifer Porter, female   DOB: 16-Jul-1945, 76 y.o.   MRN: [id number]  SW spoke with pt husband Colon to provide updates from team conference, and d/c date 6/29. Family edu scheduled for Tuesday (6/27) 1pm-4pm.   Loralee Pacas, MSW, [place] Office: [phone] Cell: [phone] Fax: [phone]

## 2021-09-09 NOTE — Progress Notes (Signed)
Physical Therapy Weekly Progress Note  Patient Details  Name: Jennifer Porter MRN: [id number] Date of Birth: 12-07-1945  Beginning of progress report period: September 02, 2021 End of progress report period: September 09, 2021  Today's Date: 09/09/2021 PT Individual Time: 0932-1029 PT Individual Time Calculation (min): 57 min   Patient has met 3 of 4 short term goals.  Pt is progressing well toward mobility goals, improving independence with bed mobility, transfers, and ambulation. Pt performing transfers consistently at [facility] to minA level, ambulating up to 53' with L platform RW and CGA, but still requiring minA/modA for bed mobility. Pt continues to be limited by pain and fear of pain provocation. Pt will benefit from skilled family education prior to discharge.  Patient continues to demonstrate the following deficits muscle weakness, decreased cardiorespiratoy endurance, and decreased sitting balance, decreased standing balance, decreased balance strategies, and difficulty maintaining precautions and therefore will continue to benefit from skilled PT intervention to increase functional independence with mobility.  Patient progressing toward long term goals..  Continue plan of care.  PT Short Term Goals Week 1:  PT Short Term Goal 1 (Week 1): Pt will complete bed mobility with minA. PT Short Term Goal 1 - Progress (Week 1): Progressing toward goal PT Short Term Goal 2 (Week 1): Pt will complete bed to chair transfer with minA. PT Short Term Goal 2 - Progress (Week 1): Met PT Short Term Goal 3 (Week 1): Pt will ambulate x50' with minA and LRAD. PT Short Term Goal 3 - Progress (Week 1): Met PT Short Term Goal 4 (Week 1): Pt will initiate stair training. PT Short Term Goal 4 - Progress (Week 1): Met Week 2:  PT Short Term Goal 1 (Week 2): STGs = LTGs  Skilled Therapeutic Interventions/Progress Updates:  Ambulation/gait training;Community reintegration;DME/adaptive equipment instruction;Neuromuscular  re-education;Psychosocial support;Stair training;UE/LE Strength taining/ROM;Balance/vestibular training;Functional electrical stimulation;Discharge planning;Wheelchair propulsion/positioning;Pain management;Skin care/wound management;Therapeutic Activities;Cognitive remediation/compensation;Disease management/prevention;Visual/perceptual remediation/compensation;Splinting/orthotics;Therapeutic Exercise;Patient/family education;Functional mobility training;UE/LE Coordination activities   Pt received seated in WC and agrees to therapy. Reports pain "isn't so bad when I'm just sitting here", but acknowledges that L hip is painful with movement. No number provided. PT provides gentle mobility and rest breaks to manage pain. Pt perform stand step transfer from [facility] to Nustep with L platform RW with cues for hand placement and sequencing. Pt completes Nustep for strength and endurance training, as well as providing gentle ROM to maximize mobility of L lower extremity. Pt completes x16:00 at workload of 5 with average steps per minute >35. PT provides cues for foot placement, as well as cueing to not utilize L upper extremity to ensure pt maintains WB precautions. Following brief seated rest break, pt ambulates x140' with L platform RW and CGA, with verbal and tactile cues for upright posture to improve balance, and increasing stride length and progression time for R lower extremity to promote increased stance time through L lower extremity. Pt ambulates very slowly with step-to gait pattern but is able to progress to partial swing-through with R lower extremity. WC transport back to room. MinA for stand step transfer to recliner. Left seated with all needs within reach.  Therapy Documentation Precautions:  Precautions Precautions: Fall Precaution Comments: LUE wrapped Required Braces or Orthoses: Splint/Cast Splint/Cast: L wrist Restrictions Weight Bearing Restrictions: Yes LUE Weight Bearing: Non weight  bearing LLE Weight Bearing: Weight bearing as tolerated   Therapy/Group: Individual Therapy  Breck Coons, PT, DPT 09/09/2021, 5:53 PM

## 2021-09-09 NOTE — Progress Notes (Signed)
Occupational Therapy Session Note  Patient Details  Name: Jennifer Porter MRN: [id number] Date of Birth: 24-Feb-1946  Today's Date: 09/09/2021 OT Individual Time: 1403-1502 OT Individual Time Calculation (min): 59 min   Short Term Goals: Week 2:  OT Short Term Goal 1 (Week 2): LTG=STG 2/2 ELOS  Skilled Therapeutic Interventions/Progress Updates:    Pt greeted seated in recliner with spouse present and agreeable to OT treatment session. Pt ambulated to dayroom w/ PFRW and CGA. No rest breaks with improved step length and pain tolerance. LB there-ex and endurance with 15 mins on NuStep on level 3. Pt with improved pain tolerance and up to 50 steps per minute. Dynamic stepping task on 4 inch step with CGA for balance, cues for weight shifting, and facilitation to achieve full hip flexion without compensating. Pt rested, then ambulated back to room in similar fashion. Pt needed OT assist to bring LE;s into bed due to fatigue. Pt left semi-reclined in bed with bed alarm on, call bell in reach, and needs met.  Therapy Documentation Precautions:  Precautions Precautions: Fall Precaution Comments: LUE wrapped Required Braces or Orthoses: Splint/Cast Splint/Cast: L wrist Restrictions Weight Bearing Restrictions: Yes LUE Weight Bearing: Non weight bearing LLE Weight Bearing: Weight bearing as tolerated Pain: Pain Assessment Pain Scale: 0-10 Pain Score: 2 Rest and repositioned    Therapy/Group: Individual Therapy  Valma Cava 09/09/2021, 2:31 PM

## 2021-09-09 NOTE — Progress Notes (Signed)
Occupational Therapy Weekly Progress Note  Patient Details  Name: Jennifer Porter MRN: [id number] Date of Birth: November 07, 1945  Beginning of progress report period: September 02, 2021 End of progress report period: September 09, 2021  Today's Date: 09/09/2021 OT Individual Time: 0998-3382 OT Individual Time Calculation (min): 73 min    Patient has met 3 of 3 short term goals.  Patient is making steady progress towards OT goals. She has demonstrated improved pain tolerance when participating in BADL task. She is doing well with carryover of adaptive strategies for LB and UB ADLs.Patient is at an overall CGA/min A level for BADL tasks and functional transfers.   Patient continues to demonstrate the following deficits: muscle weakness, decreased problem solving, and decreased standing balance, decreased balance strategies, and difficulty maintaining precautions and therefore will continue to benefit from skilled OT intervention to enhance overall performance with BADL and Reduce care partner burden.  Patient progressing toward long term goals..  Continue plan of care.  OT Short Term Goals Week 1:  OT Short Term Goal 1 (Week 1): Pt will complete LB dressing with Mod A. OT Short Term Goal 1 - Progress (Week 1): Met OT Short Term Goal 2 (Week 1): Pt will complete stand pivot toilet transfer with Min A. OT Short Term Goal 2 - Progress (Week 1): Met OT Short Term Goal 3 (Week 1): Pt will complete 1/3 toileting task with Mod A. OT Short Term Goal 3 - Progress (Week 1): Met Week 2:  OT Short Term Goal 1 (Week 2): LTG=STG 2/2 ELOS  Skilled Therapeutic Interventions/Progress Updates:    Patient greeted seated EOB and agreeable to OT treatment session. Pt reported she had just used the [facility] and it had spilled in the room. Pt completed stand-pivot to the wc with min A to get to standing and CGA to pivot with PFRW. Pt brought to the sink in wc and performed UB ADLs with assistance only for fastening bra. Nursing  hooked pt up to IV after UB bathing/dressing completed. Blocked practice for LB ADLs using AE. Pt able to thread pant legs with reacher and increased time.OT educated on use of LH shoe horn to assist with donning shoes. Pt able to get R shoe, but needed OT assist for L. Sit<>stand w/ PFRW and Min A, then worked on pulling pants up using mostly R hand. Pt would be able to do if pants were larger, but unable to get over L hip 2/2 swelling without assistance. Educated on using looser clothing for now. Addressed standing balance/endurance with standing hair brushing at the sink. Pt tolerated standing for 3 minutes during this task. OT facilitated equal weight shift onto LLE as pt gaurding L leg and trying not to put as much weight through it. OT discussed DME needs including small shower seat and 3-in-1 BSC for BADL participation at home. Pt left seated in wc with call bell in reach and needs met.   Therapy Documentation Precautions:  Precautions Precautions: Fall Precaution Comments: LUE wrapped Required Braces or Orthoses: Splint/Cast Splint/Cast: L wrist Restrictions Weight Bearing Restrictions: Yes LUE Weight Bearing: Non weight bearing LLE Weight Bearing: Weight bearing as tolerated Pain:  2/10, nursing administered pain meds    Therapy/Group: Individual Therapy  Valma Cava 09/09/2021, 8:04 AM

## 2021-09-10 LAB — BASIC METABOLIC PANEL
Anion gap: 10 (ref 5–15)
BUN: 31 mg/dL — ABNORMAL HIGH (ref 8–23)
CO2: 25 mmol/L (ref 22–32)
Calcium: 9.3 mg/dL (ref 8.9–10.3)
Chloride: 105 mmol/L (ref 98–111)
Creatinine, Ser: 0.58 mg/dL (ref 0.44–1.00)
GFR, Estimated: >60 mL/min (ref >60)
Glucose, Bld: 99 mg/dL (ref 70–99)
Potassium: 4.2 mmol/L (ref 3.5–5.1)
Sodium: 140 mmol/L (ref 135–145)

## 2021-09-10 LAB — MAGNESIUM: Magnesium: 1.5 mg/dL — ABNORMAL LOW (ref 1.7–2.4)

## 2021-09-10 LAB — GLUCOSE, CAPILLARY
Glucose-Capillary: 96 mg/dL (ref 70–99)
Glucose-Capillary: 104 mg/dL — ABNORMAL HIGH (ref 70–99)
Glucose-Capillary: 136 mg/dL — ABNORMAL HIGH (ref 70–99)
Glucose-Capillary: 185 mg/dL — ABNORMAL HIGH (ref 70–99)

## 2021-09-10 MED ORDER — MAGNESIUM SULFATE 4 GM/100ML IV SOLN
4.0000 g | Freq: Once | INTRAVENOUS | Status: AC
  Administered 2021-09-10: 4 g via INTRAVENOUS
  Filled 2021-09-10: qty 100

## 2021-09-10 MED ORDER — INSULIN DETEMIR 100 UNIT/ML ~~LOC~~ SOLN
5.0000 [IU] | Freq: Every day | SUBCUTANEOUS | Status: DC
  Administered 2021-09-10 – 2021-09-15 (×6): 5 [IU] via SUBCUTANEOUS
  Filled 2021-09-10 (×7): qty 0.05

## 2021-09-10 MED ORDER — INSULIN DETEMIR 100 UNIT/ML ~~LOC~~ SOLN
3.0000 [IU] | Freq: Every day | SUBCUTANEOUS | Status: DC
  Filled 2021-09-10: qty 0.03

## 2021-09-10 MED ORDER — HYDROCODONE-ACETAMINOPHEN 5-325 MG PO TABS: 1.0000 | ORAL_TABLET | Freq: Four times a day (QID) | ORAL | Status: DC | PRN

## 2021-09-10 NOTE — Progress Notes (Signed)
PROGRESS NOTE   Subjective/Complaints: NO new concerns.  She was on oxycodone PRN but this caused nausea yesterday.  ROS: Denies sore throat, fever, rash, blurred vision, dizziness, , vomiting, diarrhea, cough, shortness of breath or chest pain, neck pain, vision changes. + MSK pain especially Lt FA fx, lt hip, coccyx.  Nausea-improved   Objective:   No results found.  Recent Labs    09/08/21 0640  WBC 7.4  HGB 8.9*  HCT 27.0*  PLT 403*     Recent Labs    09/09/21 0547 09/10/21 0517  NA 140 140  K 4.1 4.2  CL 105 105  CO2 26 25  GLUCOSE 112* 99  BUN 31* 31*  CREATININE 0.59 0.58  CALCIUM 9.3 9.3      Intake/Output Summary (Last 24 hours) at 09/10/2021 1333 Last data filed at 09/10/2021 0700 Gross per 24 hour  Intake 540 ml  Output 501 ml  Net 39 ml         Physical Exam: Vital Signs Blood pressure 139/65, pulse 81, temperature 98.1 F (36.7 C), temperature source Oral, resp. rate 17, height 5\' 4"  (1.626 m), weight 59 kg, SpO2 99 %.  Gen: no distress, normal appearing, sitting in chair HEENT: oral mucosa pink and moist, NCAT Cardio: Reg rate, no MRG Chest: CTAB, normal effort, normal rate of breathing Abd: soft, non-distended, NT, + BS Ext: no edema  Psych: Cooperative and pleasant Skin: Clean dry intact without breakdown.  Left hip wound is CDI Neuro: Alert and oriented x3 with reasonable insight and awareness.  Functional memory.  Normal speech and language.  No cranial nerve findings.  RUE 5/5.  LUE 3-4/5 however limited at forearm due to fracture.  LLE 2/5 proximal to 4 -/5 distally Musculoskeletal: Left hip, coccyx and forearm pain.  Left forearm is wrapped with Ace.  Assessment/Plan: 1. Functional deficits which require 3+ hours per day of interdisciplinary therapy in a comprehensive inpatient rehab setting. Physiatrist is providing close team supervision and 24 hour management of active  medical problems listed below. Physiatrist and rehab team continue to assess barriers to discharge/monitor patient progress toward functional and medical goals  Care Tool:  Bathing    Body parts bathed by patient: Right arm, Left arm, Chest, Abdomen, Face   Body parts bathed by helper: Left arm (helper assisted with L under arm and back) Body parts n/a: Right lower leg, Left lower leg, Buttocks, Right upper leg, Left upper leg (had already washed LB earlier)   Bathing assist Assist Level: Maximal Assistance - Patient 24 - 49%     Upper Body Dressing/Undressing Upper body dressing   What is the patient wearing?: Bra, Pull over shirt    Upper body assist Assist Level: Moderate Assistance - Patient 50 - 74%    Lower Body Dressing/Undressing Lower body dressing      What is the patient wearing?: Pants, Underwear/pull up     Lower body assist Assist for lower body dressing: Maximal Assistance - Patient 25 - 49%     Toileting Toileting    Toileting assist Assist for toileting: Moderate Assistance - Patient 50 - 74%     Transfers Chair/bed transfer  Transfers assist     Chair/bed transfer assist level: Contact Guard/Touching assist     Locomotion Ambulation   Ambulation assist      Assist level: Minimal Assistance - Patient > 75% Assistive device: Walker-platform Max distance: 80   Walk 10 feet activity   Assist     Assist level: Minimal Assistance - Patient > 75% Assistive device: Walker-platform   Walk 50 feet activity   Assist Walk 50 feet with 2 turns activity did not occur: Safety/medical concerns  Assist level: Minimal Assistance - Patient > 75% Assistive device: Walker-platform    Walk 150 feet activity   Assist Walk 150 feet activity did not occur: Safety/medical concerns         Walk 10 feet on uneven surface  activity   Assist Walk 10 feet on uneven surfaces activity did not occur: Safety/medical concerns          Wheelchair     Assist Is the patient using a wheelchair?: No             Wheelchair 50 feet with 2 turns activity    Assist            Wheelchair 150 feet activity     Assist          Blood pressure 139/65, pulse 81, temperature 98.1 F (36.7 C), temperature source Oral, resp. rate 17, height 5\' 4"  (1.626 m), weight 59 kg, SpO2 99 %. Medical Problem List and Plan: 1. Functional deficits secondary to polytrauma with SAH left convexity , small parafalcine SDH, left IT hip fracture and left distal radius fracture             -patient may shower, please cover incisions  -Continue CIR therapies today including PT, OT, and SLP   -09/16/21 expected discharged, Mod 1 to sup  -Discussed pain control to allow for therapy 2.  Antithrombotics: -DVT/anticoagulation:  Pharmaceutical: Lovenox--Eliquis at d/c.             -antiplatelet therapy: N/A 3. Pain Management: Continue tylenol tid--  650 mg, Gabapentin 300mg  HS             -continue robaxin TID with oxycodone prn.   -pt hesitant to use narcotics but now will take an oxycodone 30" prior to her PT sessions 6/17 Has been using oxy IR 5-10 mg prn prior to working with PT 6/18 Reported poor sleep overnight due to pain but having hesitation to take pain meds 6/23 Will add hydrocodone PRN instead of oxycodone 4. Mood/Sleep:  LCSW to follow for evaluation and support.              -antipsychotic agents: N/A 5. Neuropsych/cognition: This patient is capable of making decisions on his own behalf. 6. Skin/Wound Care: Routine pressure relief measures.  7. Fluids/Electrolytes/Nutrition: encourage PO  -hypomagnesia up to 1.7 6/16, s/p 2 runs IVPB Mg++   -continue oral mag   -recheck mag level Monday 6/20 MG was 1.5 on 6/19 , discussed with pharmacy, continue supplementation, repeat labs 6/21 mg Mg low at 1.3, will replete with 1g IV for 3 doses 6/23 MG 1.5 today, spoke with pharmacy,4g IV today, recheck tomorrow  -add  protein supp for low albumin 8. Left distal radius Fx s/p ORIF: NWB left wrist but can WBAT thorough left elbow             --continue splint left forearm.  6/18 Continue splint 9. Left IT hip Fx s/p IM nailing: WBAT 10 ABLA:                -  HGB 9.2 6/11--holding at 8.8 6/15  -added Fe++ supp  -HGB 8.4 6/19, recheck CBC  -6/21 HBG up to 8.9, monitor 11. T2DM: Hgb A1c- 6.4. Will monitor BS ac/hs and use SSI for elevated BS/wound healing.              --Resume levemir 5 units at bedtime and monitor BS.  --Continue to hold metformin for now.  -CBG (last 3)  Recent Labs    09/09/21 2107 09/10/21 0559 09/10/21 1153  GLUCAP 175* 96 104*    PM sugars elevated 6/16 will resume metformin 500mg  daily to start (on 1000mg  bid at home) 6/18 CBG's ranging 123-197 over past 24-48 hrs with 500 mg metformin, consider going up on dose, since at 1,00 mg at home. 6/20 Increase metformin to 850mg  6/23 improved CBG, continue to follow 12. Small SDH/SAH: No follow up per NS. Monitor for any neurological changes.  13. Vitamin D insufficiency: 24.98-->add ergocalciferol for supplement.  14. Chronic pancreatitis: Followed by Dr. Jena Gauss and recent flare 07/2021? --Abdominal pain/diarrhea managed w/creon and prn bentyl.  --  miralax prn.  15. HTN: Monitor BP TID. Decrease Lisinopril to 10mg     09/10/2021    3:55 AM 09/09/2021    7:24 PM 09/09/2021    1:49 PM  Vitals with BMI  Systolic 139 132 427  Diastolic 65 62 53  Pulse 81 84 80   6/20 BP well controlled, continue lisinopril 10mg  16. Seizure prophylaxis: completed keppra course 17. Endstage OA left knee: Followed by Dr. Tamsen Meek injection Jan (trying to avoid surgery)             --OA moderate on xray at admit             --Ice prn. Conitnue Voltaren gel tid. observe with activity  18. Azotemia  -BUN still elevated at 31, advised drink fluids 19. Epistaxis   -Saline nose spray      LOS: 9 days A FACE TO FACE EVALUATION WAS  PERFORMED  Fanny Dance 09/10/2021, 1:33 PM

## 2021-09-11 LAB — BASIC METABOLIC PANEL
Anion gap: 7 (ref 5–15)
BUN: 27 mg/dL — ABNORMAL HIGH (ref 8–23)
CO2: 25 mmol/L (ref 22–32)
Calcium: 9.4 mg/dL (ref 8.9–10.3)
Chloride: 108 mmol/L (ref 98–111)
Creatinine, Ser: 0.62 mg/dL (ref 0.44–1.00)
GFR, Estimated: >60 mL/min (ref >60)
Glucose, Bld: 107 mg/dL — ABNORMAL HIGH (ref 70–99)
Potassium: 4.2 mmol/L (ref 3.5–5.1)
Sodium: 140 mmol/L (ref 135–145)

## 2021-09-11 LAB — GLUCOSE, CAPILLARY
Glucose-Capillary: 105 mg/dL — ABNORMAL HIGH (ref 70–99)
Glucose-Capillary: 106 mg/dL — ABNORMAL HIGH (ref 70–99)
Glucose-Capillary: 128 mg/dL — ABNORMAL HIGH (ref 70–99)
Glucose-Capillary: 154 mg/dL — ABNORMAL HIGH (ref 70–99)

## 2021-09-11 LAB — MAGNESIUM: Magnesium: 1.8 mg/dL (ref 1.7–2.4)

## 2021-09-11 MED ORDER — PANCRELIPASE (LIP-PROT-AMYL) 36000-114000 UNITS PO CPEP
36000.0000 [IU] | ORAL_CAPSULE | Freq: Two times a day (BID) | ORAL | Status: DC | PRN
  Filled 2021-09-11: qty 1

## 2021-09-12 DIAGNOSIS — L8990 Pressure ulcer of unspecified site, unspecified stage: Secondary | ICD-10-CM | POA: Insufficient documentation

## 2021-09-12 LAB — GLUCOSE, CAPILLARY
Glucose-Capillary: 96 mg/dL (ref 70–99)
Glucose-Capillary: 101 mg/dL — ABNORMAL HIGH (ref 70–99)
Glucose-Capillary: 102 mg/dL — ABNORMAL HIGH (ref 70–99)
Glucose-Capillary: 167 mg/dL — ABNORMAL HIGH (ref 70–99)

## 2021-09-13 LAB — BASIC METABOLIC PANEL
Anion gap: 11 (ref 5–15)
BUN: 32 mg/dL — ABNORMAL HIGH (ref 8–23)
CO2: 25 mmol/L (ref 22–32)
Calcium: 9.7 mg/dL (ref 8.9–10.3)
Chloride: 104 mmol/L (ref 98–111)
Creatinine, Ser: 0.61 mg/dL (ref 0.44–1.00)
GFR, Estimated: >60 mL/min (ref >60)
Glucose, Bld: 100 mg/dL — ABNORMAL HIGH (ref 70–99)
Potassium: 3.9 mmol/L (ref 3.5–5.1)
Sodium: 140 mmol/L (ref 135–145)

## 2021-09-13 LAB — GLUCOSE, CAPILLARY
Glucose-Capillary: 94 mg/dL (ref 70–99)
Glucose-Capillary: 121 mg/dL — ABNORMAL HIGH (ref 70–99)
Glucose-Capillary: 120 mg/dL — ABNORMAL HIGH (ref 70–99)
Glucose-Capillary: 143 mg/dL — ABNORMAL HIGH (ref 70–99)

## 2021-09-13 MED ORDER — ENOXAPARIN SODIUM 30 MG/0.3ML IJ SOSY
30.0000 mg | PREFILLED_SYRINGE | Freq: Two times a day (BID) | INTRAMUSCULAR | Status: DC
Start: 2021-09-13 — End: 2021-09-13

## 2021-09-13 MED ORDER — ENOXAPARIN SODIUM 30 MG/0.3ML IJ SOSY
30.0000 mg | PREFILLED_SYRINGE | Freq: Two times a day (BID) | INTRAMUSCULAR | Status: AC
  Administered 2021-09-13 – 2021-09-15 (×6): 30 mg via SUBCUTANEOUS
  Filled 2021-09-13 (×6): qty 0.3

## 2021-09-13 MED ORDER — APIXABAN 2.5 MG PO TABS
2.5000 mg | ORAL_TABLET | Freq: Two times a day (BID) | ORAL | Status: DC
  Administered 2021-09-16: 2.5 mg via ORAL
  Filled 2021-09-13: qty 1

## 2021-09-13 NOTE — Progress Notes (Signed)
Occupational Therapy Session Note  Patient Details  Name: Jennifer Porter MRN: [id number] Date of Birth: 16-Sep-1945  Today's Date: 09/13/2021 OT Individual Time: 2952-8413 OT Individual Time Calculation (min): 57 min   Short Term Goals: Week 2:  OT Short Term Goal 1 (Week 2): LTG=STG 2/2 ELOS  Skilled Therapeutic Interventions/Progress Updates:    Pt greeted seated in recliner and agreeable to OT treatment session. Pt reported need to go to the bathroom. Pt ambulated into bathroom w/ PFRW and close supervision, but min A to stand from lower recliner. Pt lowered on to commode and was able to manage clothing with supervision. Successful void of bladder. Pt completed peri-care with set-up A. Addressed standing balance/endurance with pt able to stand for majority of bathing tasks w/ supervision. Pt sat down to thread pants using reacher and supervision. OT educated on one handed technique to pull pants up over hips. She needed min A for L side with her underwear, but was able to do her pants today with increased time! OT issued RW bag and educated on uses. Pt placed reacher in bag and practiced ambulating in room and collecting wash cloths off the floor with reacher. She then ambulated 50 feet w/ RW and close supervision. Pt left seated in wc awaiting next therapy session with needs met  Therapy Documentation Precautions:  Precautions Precautions: Fall Precaution Comments: LUE wrapped Required Braces or Orthoses: Splint/Cast Splint/Cast: L wrist Restrictions Weight Bearing Restrictions: Yes LUE Weight Bearing: Non weight bearing LLE Weight Bearing: Weight bearing as tolerated Pain: Pain Assessment Pain Scale: 0-10 Pain Score: 1  Pain Type: Acute pain Pain Location: Leg Pain Orientation: Right;Left Pain Descriptors / Indicators: Aching Pain Frequency: Intermittent Pain Onset: On-going Pain Intervention(s): Repositioned  Therapy/Group: Individual Therapy  Mal Amabile 09/13/2021,  10:54 AM

## 2021-09-13 NOTE — Discharge Summary (Signed)
Physician Discharge Summary  Patient ID: Jennifer Porter MRN: [id number] DOB/AGE: 1945/09/18 76 y.o.  Admit date: 09/01/2021 Discharge date: 09/16/2021  Discharge Diagnoses:  Principal Problem:   Trauma Active Problems:   Type 2 diabetes mellitus without complication (HCC)   Subdural hematoma (HCC)   Subarachnoid hemorrhage (HCC)   Acute blood loss anemia   Pressure injury of skin Left distal radius fracture Left IT hip fracture Hypertension Seizure prophylaxis Epistaxis Chronic pancreatitis  Discharged Condition: Stable  Significant Diagnostic Studies: DG Knee Complete 4 Views Left  Result Date: 09/01/2021 CLINICAL DATA:  Knee pain, known hip fracture. EXAM: LEFT KNEE - COMPLETE 4+ VIEW COMPARISON:  None Available. FINDINGS: There is medial soft tissue swelling. The bones are osteopenic. No significant joint effusion. No acute fracture or dislocation. There is medial and patellofemoral compartment joint space narrowing with osteophyte formation compatible with degenerative change. IMPRESSION: 1. Medial soft tissue swelling. 2. No acute fracture or dislocation identified. 3. Moderate degenerative changes. Electronically Signed   By: Ronney Asters M.D.   On: 09/01/2021 19:45   DG Wrist 2 Views Left  Result Date: 08/27/2021 CLINICAL DATA:  Fracture radius and ulna EXAM: LEFT WRIST - 2 VIEW COMPARISON:  08/26/2021 FINDINGS: There is interval reduction and internal fixation of comminuted fracture of distal left radius with metallic plate and multiple surgical screws. There is comminuted fracture in the ulnar styloid with no significant change. Degenerative changes are noted in first carpometacarpal and first metacarpophalangeal joints. IMPRESSION: Interval reduction and internal fixation of comminuted fracture of distal left radius. Electronically Signed   By: Elmer Picker M.D.   On: 08/27/2021 16:21   DG HIP PORT UNILAT W OR W/O PELVIS 1V LEFT  Result Date: 08/27/2021 CLINICAL  DATA:  Internal fixation of intertrochanteric fracture of left femur EXAM: DG HIP (WITH OR WITHOUT PELVIS) 1V PORT LEFT COMPARISON:  08/26/2021 FINDINGS: There is interval internal fixation of comminuted intertrochanteric fracture of proximal left femur. There are pockets of air in the soft tissues. There is medial displacement of lesser trochanter which has not changed. IMPRESSION: Interval internal fixation of intertrochanteric fracture of left femur. Electronically Signed   By: Elmer Picker M.D.   On: 08/27/2021 11:58   DG Wrist Complete Left  Result Date: 08/27/2021 CLINICAL DATA:  Left wrist ORIF. EXAM: LEFT WRIST - COMPLETE 3+ VIEW COMPARISON:  Left wrist x-rays from yesterday. FLUOROSCOPY TIME:  Radiation Exposure Index (as provided by the fluoroscopic device): 11.69 mGy Kerma C-arm fluoroscopic images were obtained intraoperatively and submitted for post operative interpretation. FINDINGS: Multiple intraoperative fluoroscopic images demonstrate interval volar plate and screw fixation of the distal radius fracture, now in anatomic alignment. IMPRESSION: 1. Intraoperative fluoroscopic guidance for distal radius fracture ORIF. Electronically Signed   By: Titus Dubin M.D.   On: 08/27/2021 11:29   DG FEMUR MIN 2 VIEWS LEFT  Result Date: 08/27/2021 CLINICAL DATA:  Left femoral neck fracture. EXAM: LEFT FEMUR 2 VIEWS COMPARISON:  08/26/2021 FINDINGS: 4 intraoperative spot fluoro films obtained during ORIF for intertrochanteric left femoral neck fracture. No evidence for immediate hardware complication. IMPRESSION: Intraoperative assessment during ORIF for intertrochanteric left femoral neck fracture. Electronically Signed   By: Misty Stanley M.D.   On: 08/27/2021 11:29   DG C-Arm 1-60 Min-No Report  Result Date: 08/27/2021 Fluoroscopy was utilized by the requesting physician.  No radiographic interpretation.   DG C-Arm 1-60 Min-No Report  Result Date: 08/27/2021 Fluoroscopy was utilized by  the requesting physician.  No radiographic interpretation.  DG Hip Port Unilat W or [place] Pelvis 1 View Left  Result Date: 08/26/2021 CLINICAL DATA:  Recent fall with left hip pain, initial encounter EXAM: DG HIP (WITH OR WITHOUT PELVIS) 3V PORT LEFT COMPARISON:  None Available. FINDINGS: Pelvic ring is intact. Comminuted left intratrochanteric fracture is noted. No dislocation is seen. No other fractures are noted. IMPRESSION: Left intratrochanteric fracture. Electronically Signed   By: Inez Catalina M.D.   On: 08/26/2021 04:04   DG Femur Min 2 Views Left  Result Date: 08/26/2021 CLINICAL DATA:  Recent fall with left leg pain, initial encounter EXAM: LEFT FEMUR 2 VIEWS COMPARISON:  None Available. FINDINGS: Degenerative changes are noted about the left knee joint. Intratrochanteric fracture is noted of the left hip without significant displacement. No dislocation is noted. No other focal abnormality is seen. IMPRESSION: Intratrochanteric fracture of the left femur. Electronically Signed   By: Inez Catalina M.D.   On: 08/26/2021 04:03   DG Wrist Complete Left  Result Date: 08/26/2021 CLINICAL DATA:  Recent fall with left wrist pain, initial encounter EXAM: LEFT WRIST - COMPLETE 3+ VIEW COMPARISON:  None Available. FINDINGS: Comminuted distal radial fracture is noted with intra-articular extension. Impaction is noted at the fracture site with associated soft tissue swelling. Comminuted ulnar styloid fracture is noted as well. Degenerative changes at the first [facility] joint are noted. IMPRESSION: Distal left radial and ulnar fractures as described. Electronically Signed   By: Inez Catalina M.D.   On: 08/26/2021 04:02   DG Chest 1 View  Result Date: 08/26/2021 CLINICAL DATA:  Recent fall with chest pain, initial encounter EXAM: CHEST  1 VIEW COMPARISON:  None Available. FINDINGS: Cardiac shadow is within normal limits. Aortic calcifications are seen. Lungs are clear. No acute bony abnormality is noted. IMPRESSION:  No active disease. Electronically Signed   By: Inez Catalina M.D.   On: 08/26/2021 04:01   CT Cervical Spine Wo Contrast  Result Date: 08/26/2021 CLINICAL DATA:  Fall EXAM: CT HEAD WITHOUT CONTRAST CT CERVICAL SPINE WITHOUT CONTRAST TECHNIQUE: Multidetector CT imaging of the head and cervical spine was performed following the standard protocol without intravenous contrast. Multiplanar CT image reconstructions of the cervical spine were also generated. RADIATION DOSE REDUCTION: This exam was performed according to the departmental dose-optimization program which includes automated exposure control, adjustment of the mA and/or kV according to patient size and/or use of iterative reconstruction technique. COMPARISON:  None Available. FINDINGS: CT HEAD FINDINGS Brain: There is a para falcine subdural hematoma that measures 4 mm in thickness. At the anterior left convexity there is a small focus of subarachnoid blood. No midline shift or other mass effect. The size and configuration of the ventricles and extra-axial CSF spaces are normal. There is hypoattenuation of the periventricular white matter, most commonly indicating chronic ischemic microangiopathy. Vascular: No abnormal hyperdensity of the major intracranial arteries or dural venous sinuses. No intracranial atherosclerosis. Skull: Left parietal scalp hematoma.  No skull fracture. Sinuses/Orbits: No fluid levels or advanced mucosal thickening of the visualized paranasal sinuses. No mastoid or middle ear effusion. The orbits are normal. CT CERVICAL SPINE FINDINGS Alignment: Grade 1 anterolisthesis at C6-7. Skull base and vertebrae: No acute fracture. Soft tissues and spinal canal: No prevertebral fluid or swelling. No visible canal hematoma. Disc levels: No advanced spinal canal or neural foraminal stenosis. Upper chest: No pneumothorax, pulmonary nodule or pleural effusion. Other: Normal visualized paraspinal cervical soft tissues. IMPRESSION: 1. Small  parafalcine subdural hematoma without midline shift or other mass  effect. 2. Small focus of subarachnoid blood at the anterior left convexity. 3. Left parietal scalp hematoma without skull fracture. 4. No acute fracture or static subluxation of the cervical spine. Critical Value/emergent results were called by telephone at the time of interpretation on 08/26/2021 at 3:12 am to provider [facility] , who verbally acknowledged these results. Electronically Signed   By: Ulyses Jarred M.D.   On: 08/26/2021 03:38   CT HEAD WO CONTRAST (5MM)  Result Date: 08/26/2021 CLINICAL DATA:  Fall EXAM: CT HEAD WITHOUT CONTRAST CT CERVICAL SPINE WITHOUT CONTRAST TECHNIQUE: Multidetector CT imaging of the head and cervical spine was performed following the standard protocol without intravenous contrast. Multiplanar CT image reconstructions of the cervical spine were also generated. RADIATION DOSE REDUCTION: This exam was performed according to the departmental dose-optimization program which includes automated exposure control, adjustment of the mA and/or kV according to patient size and/or use of iterative reconstruction technique. COMPARISON:  None Available. FINDINGS: CT HEAD FINDINGS Brain: There is a para falcine subdural hematoma that measures 4 mm in thickness. At the anterior left convexity there is a small focus of subarachnoid blood. No midline shift or other mass effect. The size and configuration of the ventricles and extra-axial CSF spaces are normal. There is hypoattenuation of the periventricular white matter, most commonly indicating chronic ischemic microangiopathy. Vascular: No abnormal hyperdensity of the major intracranial arteries or dural venous sinuses. No intracranial atherosclerosis. Skull: Left parietal scalp hematoma.  No skull fracture. Sinuses/Orbits: No fluid levels or advanced mucosal thickening of the visualized paranasal sinuses. No mastoid or middle ear effusion. The orbits are normal. CT  CERVICAL SPINE FINDINGS Alignment: Grade 1 anterolisthesis at C6-7. Skull base and vertebrae: No acute fracture. Soft tissues and spinal canal: No prevertebral fluid or swelling. No visible canal hematoma. Disc levels: No advanced spinal canal or neural foraminal stenosis. Upper chest: No pneumothorax, pulmonary nodule or pleural effusion. Other: Normal visualized paraspinal cervical soft tissues. IMPRESSION: 1. Small parafalcine subdural hematoma without midline shift or other mass effect. 2. Small focus of subarachnoid blood at the anterior left convexity. 3. Left parietal scalp hematoma without skull fracture. 4. No acute fracture or static subluxation of the cervical spine. Critical Value/emergent results were called by telephone at the time of interpretation on 08/26/2021 at 3:12 am to provider [facility] , who verbally acknowledged these results. Electronically Signed   By: Ulyses Jarred M.D.   On: 08/26/2021 03:38    Labs:  Basic Metabolic Panel: Recent Labs  Lab 09/08/21 0640 09/09/21 0547 09/10/21 0517 09/11/21 0603 09/13/21 0555  NA 140 140 140 140 140  K 4.3 4.1 4.2 4.2 3.9  CL 106 105 105 108 104  CO2 '25 26 25 25 25  '$ GLUCOSE 106* 112* 99 107* 100*  BUN 31* 31* 31* 27* 32*  CREATININE 0.60 0.59 0.58 0.62 0.61  CALCIUM 9.5 9.3 9.3 9.4 9.7  MG 1.3*  --  1.5* 1.8  --     CBC: Recent Labs  Lab 09/08/21 0640  WBC 7.4  HGB 8.9*  HCT 27.0*  MCV 95.1  PLT 403*    CBG: Recent Labs  Lab 09/13/21 2113 09/14/21 0602 09/14/21 1117 09/14/21 1628 09/14/21 2111  GLUCAP 143* 92 143* 153* 157*   Family history.  Mother with CVA diabetes mellitus and cancer in her eye.  Brother with diabetes.  Maternal aunt with uterine cancer.  Denies any colon cancer esophageal cancer or rectal cancer  Brief HPI:   Jennifer Porter is a 76 y.o. right-handed female with history of diabetes mellitus, chronic pancreatitis, GERD, gout who fell off her porch while chasing an animal away from  her dog struck her head left hand and left hip in the process and had to call for help.  She was evaluated and [facility] 08/26/2021 found to have small focus of [place] left convexity and small parafalcine SDH, left IT hip fracture left distal radius fracture.  She was transferred to [facility] for management.  Neurosurgery evaluated patient and felt no follow-up is needed less she declined due to size of the bleed.  She underwent ORIF left distal radius and IM nailing of left hip 08/27/2021 per Dr. Doreatha Martin.  Weightbearing as tolerated left lower extremity nonweightbearing left wrist but weightbearing as tolerated to the elbow.  She was maintained on Lovenox for DVT prophylaxis transition to Eliquis at discharge.  Keppra for seizure prophylaxis for 1 week.  Acute blood loss anemia and monitored.  Chronic pancreatitis maintained on Creon.  Therapy evaluations completed due to patient decreased functional mobility was admitted for a comprehensive rehab program.   Hospital Course: Jennifer Porter was admitted to rehab 09/01/2021 for inpatient therapies to consist of PT, ST and OT at least three hours five days a week. Past admission physiatrist, therapy team and rehab RN have worked together to provide customized collaborative inpatient rehab.  Pertaining to patient's polytrauma after fall [place] left convexity conservative care no surgical intervention.  Left IT hip fracture left distal radius fracture with ORIF of left distal radius nonweightbearing at the wrist but can wait bear through the elbow.  IM nailing of left IT hip fracture weightbearing as tolerated follow-up orthopedic service Dr. Doreatha Martin.  Patient had been cleared for Lovenox for DVT prophylaxis and would begin Eliquis at discharge for DVT prophylaxis.  Acute blood loss anemia patient remained on iron supplement.  Pain managed with use of Neurontin scheduled oxycodone for breakthrough pain Robaxin for muscle spasms.  Blood sugars overall controlled  hemoglobin A1c 6.4 with insulin therapy as directed as well as Glucophage.  Chronic pancreatitis follow-up by Dr. Gala Romney.  Blood pressure soft lisinopril decreased to 10 mg and follow-up outpatient.  She did complete a course of Keppra for seizure prophylaxis.   Blood pressures were monitored on TID basis and soft and monitored  Diabetes has been monitored with ac/hs CBG checks and SSI was use prn for tighter BS control.    Rehab course: During patient's stay in rehab weekly team conferences were held to monitor patient's progress, set goals and discuss barriers to discharge. At admission, patient required max assist sit to stand moderate assist ambulate 8 feet with a left platform rolling walker  Physical exam.  Blood pressure 130/71 pulse 95 temperature 98 respiration 17 oxygen saturations 99% room air Constitutional.  No acute distress HEENT Head.  Normocephalic and atraumatic Eyes.  Pupils round and reactive to light no discharge without nystagmus Neck.  Supple nontender no JVD without thyromegaly Cardiac regular rate and rhythm without any extra sounds or murmur heard Abdomen.  Soft nontender positive bowel sounds without rebound Extremities no clubbing cyanosis or edema Neurologic.  Alert oriented speech was intact.  CN II-XII intact sensation intact to light touch all 4 extremities follows commands and answers questions Musculoskeletal.  Left upper extremity splint dressing on left thigh clean and dry effusion to left knee Strength.  5/5 in right upper and right lower extremity Strength 5/5 left shoulder abduction at least  4/5 in elbow flexion and extension able to grip fingers Right lower extremity hip flexion 3/5 knee extension 3/5 limited by pain ankle plantar dorsiflexion 4/5  He/She  has had improvement in activity tolerance, balance, postural control as well as ability to compensate for deficits. He/She has had improvement in functional use RUE/LUE  and RLE/LLE as well as  improvement in awareness.  Patient performed short distance mobility in room with platform rolling walker supervision for sit to stand and short distance to the sink.  Mostly seated for upper lower body bathing and education on one-handed techniques to use left upper extremity and using LHS for washing past knee level.  Upper body dressing minimal assist for bra clasp pullover shirt set up, lower body dressing for underwear and pants with min mod assist overall.  Standing to wash periarea and buttocks with left upper extremity.  Ambulates up to 75 feet maintaining weightbearing precautions with rolling walker.  SLP facilitating mildly complex deductive reasoning and problem-solving task by providing extra time for completion patient perform with 100% accuracy modified independent.  Full family teaching completed plan discharged to home       Disposition: Discharge to home    Diet: Carb modified  Special Instructions: No driving smoking or alcohol Nonweightbearing left upper extremity okay to bear weight through elbow Weightbearing as tolerated left lower extremity.   Resume low-dose aspirin as prior to admission after completion of Eliquis  Medications at discharge 1.  Tylenol as needed 2.  Voltaren gel 2 g 3 times daily 3.  Estrace 0.5 mg daily 4.  Ferrous sulfate 325 mg p.o. daily 5.  Neurontin 300 mg p.o. nightly 6.  Hydrocodone 1 tablet every 6 hours as needed pain 7.  Levemir 5 units nightly 8.  Creon 72,000 units p.o. 3 times daily with meals and 36,000 units twice daily as needed with snack 9.  Lisinopril 10 mg p.o. daily 10.  Glucophage 850 mg p.o. daily 11.  Robaxin 500 mg p.o. 3 times daily 12.  Protonix 40 mg p.o. daily 13.  Vitamin D 50,000 units every 7 days 14.  Eliquis 2.5 mg p.o. twice daily x1 month and stop 15.  Pravachol 10 mg daily   30-35 minutes were spent completing discharge summary and discharge planning  Discharge Instructions     Ambulatory referral  to Occupational Therapy   Complete by: As directed    Evaluate and treat   Ambulatory referral to Physical Therapy   Complete by: As directed    Evaluate and treat        Follow-up Information     Celene Squibb, MD Follow up.   Specialty: Internal Medicine Why: Call in 1-2 days for post hospital follow up Contact information: [place] [facility] [place] [address] [phone]         Haddix, Thomasene Lot, MD Follow up.   Specialty: Orthopedic Surgery Why: Call in 1-2 days for post hospital follow up Contact information: [place] [address] [phone]         Meredith Staggers, MD Follow up.   Specialty: Physical Medicine and Rehabilitation Why: As needed Contact information: [address] [place] [address] [phone]                 Signed: Cathlyn Parsons 09/15/2021, 5:36 AM

## 2021-09-14 LAB — GLUCOSE, CAPILLARY
Glucose-Capillary: 92 mg/dL (ref 70–99)
Glucose-Capillary: 143 mg/dL — ABNORMAL HIGH (ref 70–99)
Glucose-Capillary: 153 mg/dL — ABNORMAL HIGH (ref 70–99)
Glucose-Capillary: 157 mg/dL — ABNORMAL HIGH (ref 70–99)

## 2021-09-14 NOTE — Progress Notes (Signed)
Occupational Therapy Discharge Summary  Patient Details  Name: Jennifer Porter MRN: [id number] Date of Birth: 1945/09/08  Patient has met 7 of 7 long term goals due to improved activity tolerance, improved balance, ability to compensate for deficits, functional use of  LEFT upper and LEFT lower extremity, and improved coordination.  Patient to discharge at overall Supervision level.  Patient's care partner is independent to provide the necessary physical assistance at discharge for higher level iADL tasks.  Family education has been completed.   Reasons goals not met: All goals met.   Recommendation:  Patient will benefit from ongoing skilled OT services in outpatient setting to continue to advance functional skills in the area of BADL and functional use of L UE .  Equipment: 3-in-1 BSC  Reasons for discharge: treatment goals met and discharge from hospital  Patient/family agrees with progress made and goals achieved: Yes  OT Discharge Precautions/Restrictions  Precautions Precautions: Fall Precaution Comments: LUE wrapped Required Braces or Orthoses: Splint/Cast Splint/Cast: L wrist Restrictions Weight Bearing Restrictions: Yes LUE Weight Bearing: Non weight bearing LLE Weight Bearing: Weight bearing as tolerated ADL ADL Eating: Set up Where Assessed-Eating: Bed level (sat EOB and ate breakfast) Grooming: Modified independent Where Assessed-Grooming: Sitting at sink Upper Body Bathing: Supervision/safety Where Assessed-Upper Body Bathing: Sitting at sink Lower Body Bathing: Supervision/safety Where Assessed-Lower Body Bathing: Wheelchair Upper Body Dressing: Setup Where Assessed-Upper Body Dressing: Sitting at sink Lower Body Dressing: Supervision/safety Where Assessed-Lower Body Dressing: Standing at sink, Sitting at sink Toileting: Supervision/safety Where Assessed-Toileting: Bedside Commode Toilet Transfer: Distant supervision Toilet Transfer Method: Stand  pivot Science writer: Engineer, technical sales Transfer: Not assessed Social research officer, government: Not assessed ADL Comments: Pt completed dressing seated in w/c and required assistance to don bra for UB dressing. Perception  Perception: Within Functional Limits Praxis Praxis: Intact Cognition Cognition Overall Cognitive Status: Within Functional Limits for tasks assessed Arousal/Alertness: Awake/alert Orientation Level: Person;Place;Situation Person: Oriented Place: Oriented Situation: Oriented Memory: Appears intact Memory Impairment: Retrieval deficit;Decreased recall of new information Attention: Sustained Sustained Attention: Appears intact Awareness: Appears intact [employer] Scales of Cognitive Functioning: Purposeful/appropriate Brief Interview for Mental Status (BIMS) Repetition of Three Words (First Attempt): 3 Temporal Orientation: Year: Correct Temporal Orientation: Month: Accurate within 5 days Temporal Orientation: Day: Correct Recall: "Sock": Yes, no cue required Recall: "Blue": Yes, no cue required Recall: "Bed": Yes, no cue required BIMS Summary Score: 15 Sensation Sensation Light Touch: Appears Intact Hot/Cold: Appears Intact Proprioception: Appears Intact Coordination Gross Motor Movements are Fluid and Coordinated: No Fine Motor Movements are Fluid and Coordinated: Yes Coordination and Movement Description: impaired due to distal radius fx and splint Finger Nose Finger Test: Intact Motor  Motor Motor: Within Functional Limits Mobility  Bed Mobility Bed Mobility: Supine to Sit;Sit to Supine Supine to Sit: Supervision/Verbal cueing Sit to Supine: Supervision/Verbal cueing Transfers Sit to Stand: Supervision/Verbal cueing Stand to Sit: Supervision/Verbal cueing  Balance Balance Balance Assessed: Yes Static Sitting Balance Static Sitting - Balance Support: Feet supported;No upper extremity supported Static Sitting - Level of  Assistance: 7: Independent Dynamic Sitting Balance Dynamic Sitting - Level of Assistance: 6: Modified independent (Device/Increase time) Static Standing Balance Static Standing - Balance Support: Bilateral upper extremity supported Static Standing - Level of Assistance: 5: Stand by assistance Dynamic Standing Balance Dynamic Standing - Balance Support: During functional activity;Bilateral upper extremity supported Dynamic Standing - Level of Assistance: 5: Stand by assistance Extremity/Trunk Assessment RUE Assessment RUE Assessment: Within Functional Limits LUE Assessment LUE Assessment:  Exceptions to [facility] Active Range of Motion (AROM) Comments: shoulder flexion WFL, wrist movement restricted d/t cast/surgery   Jennifer Porter 09/14/2021, 10:14 AM

## 2021-09-14 NOTE — Patient Care Conference (Signed)
Inpatient RehabilitationTeam Conference and Plan of Care Update Date: 09/14/2021   Time: 10:36 AM    Patient Name: Jennifer Porter      Medical Record Number: [id number]  Date of Birth: 1945/05/26 Sex: Female         Room/Bed: 4W11C/4W11C-01 Payor Info: Payor: [employer] MEDICARE / Plan: [facility] MEDICARE / Product Type: *No Product type* /    Admit Date/Time:  09/01/2021  5:00 PM  Primary Diagnosis:  Trauma  Hospital Problems: Principal Problem:   Trauma Active Problems:   Type 2 diabetes mellitus without complication ([place])   Subdural hematoma ([place])   Subarachnoid hemorrhage ([place])   Acute blood loss anemia   Pressure injury of skin    Expected Discharge Date: Expected Discharge Date: 09/16/21  Team Members Present: Physician leading conference: Dr. Alger Simons Social Worker Present: Loralee Pacas, [place] Nurse Present: Dorthula Nettles, RN PT Present: Tereasa Coop, PT OT Present: Cherylynn Ridges, OT SLP Present: Sherren Kerns, SLP PPS Coordinator present : Ileana Ladd, PT     Current Status/Progress Goal Weekly Team Focus  Bowel/Bladder   continent of B&B. last BM-6/25  remain continent  assist with toileting q shift and PRN   Swallow/Nutrition/ Hydration             ADL's   supervision/CGA LB ADLs, supervision UB ADLs, supervision, CGA ambulation w/ PFRW  Supervision  self-care retraining, pt/family education, dc planning, transfers, pain management   Mobility   minA bed mobility, CGA transfers, ambulating up to 125' with L platform RW  supervision  family ed, DC prep, bed mobility   Communication             Safety/Cognition/ Behavioral Observations  mod I  Mod I  Discharged from ST 6/26   Pain   managed with current pain managment  pain<3  assess pain q shift and PRN   Skin   sx incisions L wrist, IM to L hip  proper healing of sx sites. no new breakdowns  assess skin q shift and PRN     Discharge Planning:  D/c to home with suppot from husband.  Fam edu today (6/27) 1pm-4pm with pt husband.   Team Discussion: On going skin issues. Pain improving. MD to assess left arm today. Continent B/B, reports mild pain. Stage 1 to coccyx, left hand and leg incisions with no s/s infection. Has foot drop. Husband in for family education today.   Patient on target to meet rehab goals: yes, supervision to mod I goals. Currently supervision ADL's, supervision/CGA overall for mobility. Gait pattern improving. SLP at baseline, discharged from services.   *See Care Plan and progress notes for long and short-term goals.   Revisions to Treatment Plan:  Assess left arm, finalizing discharge plans   Teaching Needs: Family education, medication/pain management, skin/wound care, transfer/gait training, etc.   Current Barriers to Discharge: No current barriers  Possible Resolutions to Barriers: All barriers addressed     Medical Summary Current Status: pain improved. still with left radial splint. skin issues being addressed  Barriers to Discharge: Medical stability   Possible Resolutions to [employer] Focus: daily assessment of labs, skin. adjusting pain regimen   Continued Need for Acute Rehabilitation Level of Care: The patient requires daily medical management by a physician with specialized training in physical medicine and rehabilitation for the following reasons: Direction of a multidisciplinary physical rehabilitation program to maximize functional independence : Yes Medical management of patient stability for increased activity during participation in an  intensive rehabilitation regime.: Yes Analysis of laboratory values and/or radiology reports with any subsequent need for medication adjustment and/or medical intervention. : Yes   I attest that I was present, lead the team conference, and concur with the assessment and plan of the team.   Cristi Loron 09/14/2021, 2:47 PM

## 2021-09-14 NOTE — Progress Notes (Signed)
Physical Therapy Session Note  Patient Details  Name: Jennifer Porter MRN: [id number] Date of Birth: [birth year]  Today's Date: 09/14/2021 PT Individual Time: 0950-1019 PT Individual Time Calculation (min): 29 min   Short Term Goals: Week 2:  PT Short Term Goal 1 (Week 2): STGs = LTGs  Skilled Therapeutic Interventions/Progress Updates: Pt presented in w/c agreeable to therapy. Pt denies pain at start of session and no c/o increased pain during activity. Session focused on functional transfers and mobility in preparation for d/c. Pt transported to ortho gym for time management. Participated in ascending/descending ramp x 2 with PFRW and CGA for improved independence upon d/c. Pt was able to perform with CGA overall and demonstrated mod I management of RW. Pt then ambulated ~81ft to NuStep and participated in NuStep activity L5 x 10 min maintaining 40-50 SPM for cardiovascular endurance. Performed stand pivot transfer to return to w/c and transported back to room. Pt remained in w/c at end of session with call bell within reach and needs met.      Therapy Documentation Precautions:  Precautions Precautions: Fall Precaution Comments: LUE wrapped Required Braces or Orthoses: Splint/Cast Splint/Cast: L wrist Restrictions Weight Bearing Restrictions: Yes LUE Weight Bearing: Non weight bearing LLE Weight Bearing: Weight bearing as tolerated General:   Vital Signs:   Pain:   Mobility: Bed Mobility Bed Mobility: Supine to Sit;Sit to Supine Supine to Sit: Supervision/Verbal cueing Sit to Supine: Supervision/Verbal cueing Transfers Transfers: Sit to Stand;Stand to Sit;Stand Pivot Transfers Sit to Stand: Supervision/Verbal cueing Stand to Sit: Supervision/Verbal cueing Stand Pivot Transfers: Supervision/Verbal cueing Transfer (Assistive device): Left platform walker Locomotion :    Trunk/Postural Assessment :    Balance: Balance Balance Assessed: Yes Static Sitting  Balance Static Sitting - Balance Support: Feet supported;No upper extremity supported Static Sitting - Level of Assistance: 7: Independent Dynamic Sitting Balance Dynamic Sitting - Level of Assistance: 6: Modified independent (Device/Increase time) Static Standing Balance Static Standing - Balance Support: Bilateral upper extremity supported Static Standing - Level of Assistance: 5: Stand by assistance Dynamic Standing Balance Dynamic Standing - Balance Support: During functional activity;Bilateral upper extremity supported Dynamic Standing - Level of Assistance: 5: Stand by assistance Exercises:   Other Treatments:      Therapy/Group: Individual Therapy  Sinthia Karabin 09/14/2021, 12:30 PM

## 2021-09-14 NOTE — Progress Notes (Signed)
Occupational Therapy Session Note  Patient Details  Name: Jennifer Porter MRN: [id number] Date of Birth: 1946-03-16  Session 1 Today's Date: 09/14/2021 OT Individual Time: 1303-1400 OT Individual Time Calculation (min): 57 min   Session 2 Today's Date: 09/14/2021 OT Individual Time: 1303-1400 OT Individual Time Calculation (min): 57 min    Short Term Goals: Week 2:  OT Short Term Goal 1 (Week 2): LTG=STG 2/2 ELOS  Skilled Therapeutic Interventions/Progress Updates:    Session 1 Pt greeted seated EOB and agreeable to OT treatment session focused on self-care retraining at shower level. Pt stood from EOB w/ PFRW and supervision. She ambulated to the bathroom and transferred onto [facility] over toilet with supervision as well. Pt voided bowel and bladder, then completed peri-care without OT assist. OT covered up IV and L UE cast prior to showering. Bathing completed using LH sponge with supervision overall. Dressing tasks completed from wc using reacher. Worked on standing balance/endurance with pulling pants up and performing grooming tasks in standing. Pt able to integrate adaptive strategies without cues during all BADL tasks. She has also demonstrated improved pain tolerance and speed in which she performs functional ambulation and BADL tasks. Pt left seated in wc at end of session with nursing present to administer medications  Session 2 Pt greeted seated in wc with family present for family education. OT reviewed OT goals and pt progress with pt and family. OT demonstrated use of long handled reacher and sponge for LB ADLs and educated on location to purchase privately. Pt brought down to therapy apartment and practiced walk-in shower transfer in simulated home environment using BSC as shower seat. Practiced stepping back over ledge, then sitting on BSC using PFRW. Educated family on providing safe supervision or CGA if needed due to fatigue. Educated on safety transferring on and off of recliner  chair and using a book to stop rocking. Pt needed min A to get from much lower surface, however her recliner at home is higher and she should not have difficulty. Reviewed covering L UE for shower with waterproof dressing. Pt returned to room in wc for time management and daughter practiced ambulation into bathroom for toileting. Pt voided bladder and completed peri-care without assist. Reviewed PFRW positioning at the sink to wash hands as well as L hand exercise handout. Pt left seated in wc with family present and needs met.  Therapy Documentation Precautions:  Precautions Precautions: Fall Precaution Comments: LUE wrapped Required Braces or Orthoses: Splint/Cast Splint/Cast: L wrist Restrictions Weight Bearing Restrictions: Yes LUE Weight Bearing: Non weight bearing LLE Weight Bearing: Weight bearing as tolerated  Pain:  2/10 in L hip, rest and repositioned   Therapy/Group: Individual Therapy  Mal Amabile 09/14/2021, 2:12 PM

## 2021-09-15 ENCOUNTER — Inpatient Hospital Stay (HOSPITAL_COMMUNITY): Payer: Medicare Other

## 2021-09-15 LAB — GLUCOSE, CAPILLARY
Glucose-Capillary: 100 mg/dL — ABNORMAL HIGH (ref 70–99)
Glucose-Capillary: 110 mg/dL — ABNORMAL HIGH (ref 70–99)
Glucose-Capillary: 108 mg/dL — ABNORMAL HIGH (ref 70–99)
Glucose-Capillary: 181 mg/dL — ABNORMAL HIGH (ref 70–99)

## 2021-09-15 MED ORDER — INSULIN DETEMIR 100 UNIT/ML ~~LOC~~ SOLN: 5.0000 [IU] | Freq: Every day | SUBCUTANEOUS | 11 refills | Status: DC

## 2021-09-15 MED ORDER — FERROUS SULFATE 325 (65 FE) MG PO TABS: 325.0000 mg | ORAL_TABLET | Freq: Every day | ORAL | 3 refills | Status: AC

## 2021-09-15 MED ORDER — POLYETHYLENE GLYCOL 3350 17 G PO PACK: 17.0000 g | PACK | Freq: Every day | ORAL | 0 refills | Status: DC | PRN

## 2021-09-15 MED ORDER — VITAMIN D (ERGOCALCIFEROL) 1.25 MG (50000 UNIT) PO CAPS: 50000.0000 [IU] | ORAL_CAPSULE | ORAL | 0 refills | Status: DC

## 2021-09-15 MED ORDER — DICLOFENAC SODIUM 1 % EX GEL
2.0000 g | Freq: Three times a day (TID) | CUTANEOUS | 0 refills | Status: AC
Start: 2021-09-15 — End: ?

## 2021-09-15 MED ORDER — METFORMIN HCL 850 MG PO TABS: 850.0000 mg | ORAL_TABLET | Freq: Every day | ORAL | 0 refills | Status: DC

## 2021-09-15 MED ORDER — APIXABAN 2.5 MG PO TABS: 2.5000 mg | ORAL_TABLET | Freq: Two times a day (BID) | ORAL | 0 refills | Status: DC

## 2021-09-15 MED ORDER — METHOCARBAMOL 500 MG PO TABS: 500.0000 mg | ORAL_TABLET | Freq: Three times a day (TID) | ORAL | 0 refills | Status: DC

## 2021-09-15 MED ORDER — LISINOPRIL 10 MG PO TABS: 10.0000 mg | ORAL_TABLET | Freq: Every day | ORAL | 0 refills | Status: AC

## 2021-09-15 MED ORDER — DICYCLOMINE HCL 10 MG PO CAPS: 10.0000 mg | ORAL_CAPSULE | Freq: Two times a day (BID) | ORAL | 0 refills | Status: DC | PRN

## 2021-09-15 MED ORDER — HYDROCODONE-ACETAMINOPHEN 5-325 MG PO TABS
1.0000 | ORAL_TABLET | Freq: Four times a day (QID) | ORAL | 0 refills | Status: DC | PRN
Start: 2021-09-15 — End: 2022-08-17

## 2021-09-15 NOTE — Progress Notes (Signed)
Inpatient Rehabilitation Discharge Medication Review by a Pharmacist  A complete drug regimen review was completed for this patient to identify any potential clinically significant medication issues.  High Risk Drug Classes Is patient taking? Indication by Medication  Antipsychotic No   Anticoagulant Yes Eliquis for VTE ppx  Antibiotic No   Opioid Yes Vicodin prn pain  Antiplatelet No   Hypoglycemics/insulin Yes Metformin, levemir for DM  Vasoactive Medication Yes Lisinopril for BP  Chemotherapy No   Other Yes Pravastatin, fenofibric acid for HLD Estradiol for hormone replacement  Gabapentin for pain  Methocarbamol for muscle spasms  Pantoprazole, Creon, Dicyclomine for GERD/spasms     Type of Medication Issue Identified Description of Issue Recommendation(s)  Drug Interaction(s) (clinically significant)     Duplicate Therapy     Allergy     No Medication Administration End Date     Incorrect Dose     Additional Drug Therapy Needed     Significant med changes from prior encounter (inform family/care partners about these prior to discharge).    Other       Clinically significant medication issues were identified that warrant physician communication and completion of prescribed/recommended actions by midnight of the next day:  No  Pharmacist comments: None  Time spent performing this drug regimen review (minutes):  20 minutes   Tad Moore 09/15/2021 10:52 AM

## 2021-09-15 NOTE — Progress Notes (Signed)
Physical Therapy Discharge Summary  Patient Details  Name: Jennifer Porter MRN: [id number] Date of Birth: 04-Sep-1945  Today's Date: 09/15/2021 PT Individual Time: 7342-8768 and 1302-1359 PT Individual Time Calculation (min): 43 min and 57 min   Patient has met 9 of 9 long term goals due to improved activity tolerance, improved balance, increased strength, decreased pain, and improved awareness.  Patient to discharge at an ambulatory level Supervision.   Patient's husband and daughter attended family education and are independent to provide the necessary physical assistance at discharge.  Reasons goals not met: NA  Recommendation:  Patient will benefit from ongoing skilled PT services in outpatient setting to continue to advance safe functional mobility, address ongoing impairments in strength, balance, ambulation, endurance, and minimize fall risk.  Equipment: L Platform RW  Reasons for discharge: treatment goals met and discharge from hospital  Patient/family agrees with progress made and goals achieved: Yes  Skilled therapeutic Interventions: 1st Session: Pt received seated in [facility] and agrees to therapy. Reports some pain in L hip but number not provided. PT provides gentle mobility and rest breaks as needed to manage pain. WC transport to gym for time management. Pt performs stand step transfer to Nustep with L platform RW and cues for hand placement, sequencing, and positioning. Pt completes x12:00 on Nustep at workload of 6 with average steps per minute >45. Performed for strength and endurance training as well as increasing ROM of L lower extremity. Pt ambulates restroom with L platform RW and cues for posture and sequencing. Pt requires minA for stand from low toilet with cues for weight shifting and initiation. Pt then ambulates x100' back to room with same verbal cues, as well as cueing to increase step length on R side to promote increased stance time on L. Pt left seated in recliner  with alarm intact and all needs within reach  2nd Session: Pt received seated in recliner and agrees to therapy. Pain reported in L hip. PT provides mobility and rest breaks to manage pain. Pt performs sit to stand with cues for hand placement and body mechanics. Pt ambulates to toilet with L platform RM, with cues for AD management and sequencing. Pt then ambulates x150' with verbal cues to increase stance time on L and increase R stride length to promote reciprocal symmetrical gait pattern. Pt performs car transfers with cues for body mechanics and reminder to not utilize L hand for assistance to ensure pt maintains NWB precautions. Pt then transfers to mat table with walker. Sit to supine with cues for positioning. Pt completes supine therex for strengthening of L lower extremity.  Pt completes x10: Ankle pumps, quad sets, glute sets, SAQs, SLRs, and hip abduction. PT provides pt with HEP printout of supine therex. Supine to sit with cues for positioning. Stand step transfer back to WC. Pt left in [facility] with alarm intact and all needs within reach.  PT Discharge Precautions/Restrictions Precautions Precautions: Fall Precaution Comments: LUE wrapped Required Braces or Orthoses: Splint/Cast Splint/Cast: L wrist Restrictions Weight Bearing Restrictions: Yes LUE Weight Bearing: Non weight bearing LLE Weight Bearing: Weight bearing as tolerated Pain Interference Pain Interference Pain Effect on Sleep: 2. Occasionally Pain Interference with Therapy Activities: 1. Rarely or not at all Pain Interference with Day-to-Day Activities: 1. Rarely or not at all Vision/Perception  Vision - History Ability to See in Adequate Light: 0 Adequate Perception Perception: Within Functional Limits Praxis Praxis: Intact  Cognition Overall Cognitive Status: Within Functional Limits for tasks assessed Arousal/Alertness: Awake/alert  Orientation Level: Oriented X4 Year: 2023 Month: June Day of Week:  Correct Attention: Sustained Sustained Attention: Appears intact Memory: Appears intact Awareness: Appears intact Awareness Impairment: Emergent impairment;Anticipatory impairment Problem Solving: Appears intact Safety/Judgment: Appears intact [employer] Scales of Cognitive Functioning: Purposeful/appropriate Sensation Sensation Light Touch: Appears Intact Coordination Gross Motor Movements are Fluid and Coordinated: No Fine Motor Movements are Fluid and Coordinated: Yes Coordination and Movement Description: impaired due to distal radius fx and splint Finger Nose Finger Test: Intact Motor  Motor Motor: Within Functional Limits  Mobility Bed Mobility Bed Mobility: Supine to Sit;Sit to Supine Supine to Sit: Supervision/Verbal cueing Sit to Supine: Supervision/Verbal cueing Transfers Transfers: Sit to Stand;Stand to Sit;Stand Pivot Transfers Sit to Stand: Supervision/Verbal cueing Stand to Sit: Supervision/Verbal cueing Stand Pivot Transfers: Supervision/Verbal cueing Stand Pivot Transfer Details: Verbal cues for precautions/safety;Verbal cues for safe use of DME/AE Transfer (Assistive device): Left platform walker Locomotion  Gait Ambulation: Yes Gait Assistance: Supervision/Verbal cueing Gait Distance (Feet): 150 Feet Assistive device: Left platform walker Gait Assistance Details: Verbal cues for technique;Verbal cues for gait pattern;Verbal cues for safe use of DME/AE Gait Gait: Yes Gait Pattern: Impaired Gait Pattern: Step-to pattern Gait velocity: decreased Stairs / Additional Locomotion Stairs: Yes Stairs Assistance: Supervision/Verbal cueing Stair Management Technique: With walker Number of Stairs: 1 Height of Stairs: 5 Ramp: Supervision/Verbal cueing Curb: Supervision/Verbal cueing Wheelchair Mobility Wheelchair Mobility: No  Trunk/Postural Assessment  Cervical Assessment Cervical Assessment: Within Functional Limits Thoracic Assessment Thoracic  Assessment: Within Functional Limits Lumbar Assessment Lumbar Assessment: Within Functional Limits Postural Control Postural Control: Within Functional Limits  Balance Balance Balance Assessed: Yes Static Sitting Balance Static Sitting - Balance Support: Feet supported;No upper extremity supported Static Sitting - Level of Assistance: 7: Independent Dynamic Sitting Balance Dynamic Sitting - Balance Support: No upper extremity supported;Feet supported Dynamic Sitting - Level of Assistance: 6: Modified independent (Device/Increase time) Static Standing Balance Static Standing - Balance Support: Bilateral upper extremity supported Static Standing - Level of Assistance: 5: Stand by assistance Dynamic Standing Balance Dynamic Standing - Balance Support: During functional activity;Bilateral upper extremity supported Dynamic Standing - Level of Assistance: 5: Stand by assistance Extremity Assessment  RLE Assessment RLE Assessment: Within Functional Limits LLE Assessment LLE Assessment: Exceptions to [facility] General Strength Comments: Not formally tested secondary to ORIF. grossly 3+/5, per clinical observation.    Breck Coons 09/15/2021, 10:41 AM

## 2021-09-15 NOTE — Progress Notes (Signed)
Ortho Trauma Note  Doing okay. Having some pain in left wrist. Was loosened yesterday and ACE wrap tightened. Feels better after this. Left hip has some pain but doing well. Ready to discharge tomorrow  A/P: Left intertrochanteric femur fracture and left distal radius fracture  NWB thru wrist, WB thru elbow WBAT LLE X-rays ordered for today. Plan to transition to removable wrist splint if x-rays stable F/U in 3 weeks for outpatient follow up  Shona Needles, MD [facility] [phone] (office) [email or web address]

## 2021-09-15 NOTE — Progress Notes (Signed)
Physical Therapy Session Note  Patient Details  Name: Jennifer Porter MRN: [id number] Date of Birth: [birth year]  Today's Date: 09/15/2021 PT Individual Time: 2993-7169 PT Individual Time Calculation (min): 45 min   Short Term Goals: Week 2:  PT Short Term Goal 1 (Week 2): STGs = LTGs  Skilled Therapeutic Interventions/Progress Updates:    Pt received seated in w/c in room, agreeable to PT session. Pt reports her pain is well-controlled this AM. Pt's PFRW she will d/c home with has arrived to her room, assisted pt with setting up [place] to appropriate fit for her. Sit to stand and transfers with PFRW at Supervision level throughout session. Ambulation up to 150 ft with PFRW at Supervision level. Ascend/descend ramp and ambulation across uneven surface with PFRW at Supervision level with cues for safety and RW management. Ascend/descend one 6" curb step with PFRW and CGA for some balance and RW management, cues for safety. Pt returned to room and left seated in w/c with needs in reach at end of session.  Therapy Documentation Precautions:  Precautions Precautions: Fall Precaution Comments: LUE wrapped Required Braces or Orthoses: Splint/Cast Splint/Cast: L wrist Restrictions Weight Bearing Restrictions: Yes LUE Weight Bearing: Non weight bearing LLE Weight Bearing: Weight bearing as tolerated       Therapy/Group: Individual Therapy   Excell Seltzer, PT, DPT, CSRS 09/15/2021, 12:31 PM

## 2021-09-15 NOTE — Progress Notes (Signed)
Occupational Therapy Session Note  Patient Details  Name: Jennifer Porter MRN: [id number] Date of Birth: 02-01-1946  Session 1: Today's Date: 09/15/2021 OT Individual Time: 4097-3532 OT Individual Time Calculation (min): 41 min   Session 2: Today's Date: 09/15/2021 OT Individual Time: 1435-1500 OT Individual Time Calculation (min): 25 min    Short Term Goals: Week 2:  OT Short Term Goal 1 (Week 2): LTG=STG 2/2 ELOS  Skilled Therapeutic Interventions/Progress Updates:    Session 1: Pt received sitting EOB reading her book, requesting to use the restroom and get dressed for the day. Pt stood with walker and walked to the restroom with (S). Voided on the toilet and completed all peri-care. Therapist donned sacral foam dressing on bottom. Walked to edge of sink to complete oral/facial care in standing at (S). Dressed UB standing at the sink with (S) needing assistance only to clasp the bra in the back. LB dressing completed sitting in w/c with assistance only to initiate threading of feet through brief d/t not having the reacher in the room. Pt left in w/c with call bell in reach and all needs met.  Session 2: Pt received sitting in w/c reading her book and agreeable to OT session. Discussed d/c and concerns going home. Pt stood and completed functional mobility to day gym with walker and (S). Pt reporting pain and stiffness in RLE. Placed voltaran on knee (ok-ed by LPN) to relieve pain. Focused session on dynamic standing balance by having patient stand forward-facing and using RUE to tap numbers 1-6 on wall without any support. Graded the activity up by having pt step back 4 times to increase the challenge. Second round, had pt face side-ways and tap numbers, taking 4 lateral steps away from the wall to increase the challenge. Activity focusing on reaching and simulating reaching in the home for items such as in the kitchen or laundry room.  Finished session with alternating standing cone taps to  challenge hip flexion and dynamic standing balance. Completed functional mobility back to room with (S). Left in bed with call bell in reach and all needs met.  Therapy Documentation Precautions:  Precautions Precautions: Fall Precaution Comments: LUE wrapped Required Braces or Orthoses: Splint/Cast Splint/Cast: L wrist Restrictions Weight Bearing Restrictions: Yes LUE Weight Bearing: Non weight bearing LLE Weight Bearing: Weight bearing as tolerated Therapy/Group: Individual Therapy  Catalina Lunger 09/15/2021, 7:47 AM

## 2021-09-15 NOTE — Progress Notes (Signed)
PROGRESS NOTE   Subjective/Complaints: Overall doing well. Pleased with progress and that she's going home tomorrow!  ROS: Patient denies fever, rash, sore throat, blurred vision, dizziness, nausea, vomiting, diarrhea, cough, shortness of breath or chest pain, back/neck pain, headache, or mood change.     Objective:   No results found.  No results for input(s): "WBC", "HGB", "HCT", "PLT" in the last 72 hours.   Recent Labs    09/13/21 0555  NA 140  K 3.9  CL 104  CO2 25  GLUCOSE 100*  BUN 32*  CREATININE 0.61  CALCIUM 9.7     Intake/Output Summary (Last 24 hours) at 09/15/2021 0900 Last data filed at 09/15/2021 0730 Gross per 24 hour  Intake 838 ml  Output 300 ml  Net 538 ml     Pressure Injury 09/10/21 Coccyx Bilateral Stage 1 -  Intact skin with non-blanchable redness of a localized area usually over a bony prominence. (Active)  09/10/21 2206  Location: Coccyx  Location Orientation: Bilateral  Staging: Stage 1 -  Intact skin with non-blanchable redness of a localized area usually over a bony prominence.  Wound Description (Comments):   Present on Admission: No    Physical Exam: Vital Signs Blood pressure 108/74, pulse 81, temperature 97.6 F (36.4 C), temperature source Oral, resp. rate 16, height '5\' 4"'$  (1.626 m), weight 59 kg, SpO2 100 %.   Constitutional: No distress . Vital signs reviewed. HEENT: NCAT, EOMI, oral membranes moist Neck: supple Cardiovascular: RRR without murmur. No JVD    Respiratory/Chest: CTA Bilaterally without wheezes or rales. Normal effort    GI/Abdomen: BS +, non-tender, non-distended Ext: no clubbing, cyanosis, or edema Psych: pleasant and cooperative  Skin: Coccyx wound.   Left hip wound is CDI. Left forearm incision cdi with steristrips.  Neuro: Alert and oriented x3 with reasonable insight and awareness.  Functional memory.  Normal speech and language.  No cranial nerve  findings.  RUE 5/5.  LUE 3-4/5 however limited at forearm due to fracture.  LLE 2/5 proximal to 4 -/5 distally Musculoskeletal: Left hip, coccyx and forearm pain.  Left forearm is wrapped with ACE/splint--sl tender, splint fiting  Assessment/Plan: 1. Functional deficits which require 3+ hours per day of interdisciplinary therapy in a comprehensive inpatient rehab setting. Physiatrist is providing close team supervision and 24 hour management of active medical problems listed below. Physiatrist and rehab team continue to assess barriers to discharge/monitor patient progress toward functional and medical goals  Care Tool:  Bathing    Body parts bathed by patient: Right arm, Left arm, Chest, Abdomen, Front perineal area, Right upper leg, Buttocks, Left upper leg, Right lower leg, Face, Left lower leg   Body parts bathed by helper: Left arm (helper assisted with L under arm and back) Body parts n/a: Right lower leg, Left lower leg, Buttocks, Right upper leg, Left upper leg (had already washed LB earlier)   Bathing assist Assist Level: Supervision/Verbal cueing     Upper Body Dressing/Undressing Upper body dressing   What is the patient wearing?: Pull over shirt    Upper body assist Assist Level: Set up assist    Lower Body Dressing/Undressing Lower body dressing  What is the patient wearing?: Underwear/pull up, Pants     Lower body assist Assist for lower body dressing: Supervision/Verbal cueing     Toileting Toileting    Toileting assist Assist for toileting: Supervision/Verbal cueing     Transfers Chair/bed transfer  Transfers assist     Chair/bed transfer assist level: Supervision/Verbal cueing     Locomotion Ambulation   Ambulation assist      Assist level: Minimal Assistance - Patient > 75% Assistive device: Walker-platform Max distance: 80   Walk 10 feet activity   Assist     Assist level: Minimal Assistance - Patient > 75% Assistive device:  Walker-platform   Walk 50 feet activity   Assist Walk 50 feet with 2 turns activity did not occur: Safety/medical concerns  Assist level: Minimal Assistance - Patient > 75% Assistive device: Walker-platform    Walk 150 feet activity   Assist Walk 150 feet activity did not occur: Safety/medical concerns         Walk 10 feet on uneven surface  activity   Assist Walk 10 feet on uneven surfaces activity did not occur: Safety/medical concerns         Wheelchair     Assist Is the patient using a wheelchair?: No             Wheelchair 50 feet with 2 turns activity    Assist            Wheelchair 150 feet activity     Assist          Blood pressure 108/74, pulse 81, temperature 97.6 F (36.4 C), temperature source Oral, resp. rate 16, height '5\' 4"'$  (1.626 m), weight 59 kg, SpO2 100 %. Medical Problem List and Plan: 1. Functional deficits secondary to polytrauma with SAH left convexity , small parafalcine SDH, left IT hip fracture and left distal radius fracture             -patient may shower, please cover incisions  -Continue CIR therapies including PT, OT, and SLP   -09/16/21 expected discharged, Mod I to sup   2.  Antithrombotics: -DVT/anticoagulation:  Pharmaceutical: Lovenox--Eliquis at d/c.             -antiplatelet therapy: N/A 3. Pain Management: Continue tylenol tid--  650 mg, Gabapentin '300mg'$  HS             -continue robaxin TID with oxycodone prn.   Now on hydrocodone , no c/o nausea  4. Mood/Sleep:  LCSW to follow for evaluation and support.              -antipsychotic agents: N/A 5. Neuropsych/cognition: This patient is capable of making decisions on his own behalf. 6. Skin/Wound Care: Routine pressure relief measures. donut cushion for chair, RIght lateral decubitus position for bed  7. Fluids/Electrolytes/Nutrition: encourage PO  -hypomagnesia    -continue oral mag supp daily   -has been as low as 1.3, has received iv mg  multiple x     -add protein supp for low albumin 8. Left distal radius Fx s/p ORIF 6/9: NWB left wrist but can WBAT thorough left elbow             --continue splint left forearm.   6/28 Dr. Doreatha Martin saw patient this morning. If xrays ok, will transition to removable splint   9. Left IT hip Fx s/p IM nailing: WBAT 10 ABLA:                -HGB  9.2 6/11--holding at 8.8 6/15  -added Fe++ supp  -HGB 8.4 6/19, recheck CBC  -6/21 HBG up to 8.9 11. T2DM: Hgb A1c- 6.4. Will monitor BS ac/hs and use SSI for elevated BS/wound healing.              --Resume levemir 5 units at bedtime and monitor BS.  --Continue to hold metformin for now.  -CBG (last 3)  Recent Labs    09/14/21 1628 09/14/21 2111 09/15/21 0619  GLUCAP 153* 157* 100*  Controlled 6/28 12. Small SDH/SAH: No follow up per NS. Monitor for any neurological changes.  13. Vitamin D insufficiency: 24.98-->add ergocalciferol for supplement.  14. Chronic pancreatitis: Followed by Dr. Gala Romney and recent flare 07/2021? --Abdominal pain/diarrhea managed w/creon and prn bentyl.  --  miralax prn.  15. HTN: Monitor BP TID. Decrease Lisinopril to '10mg'$     09/15/2021    5:16 AM 09/14/2021    7:23 PM 09/14/2021    3:50 PM  Vitals with BMI  Systolic 416 606 301  Diastolic 74 53 55  Pulse 81 82 85   6/28 BP well controlled, continue lisinopril '10mg'$ , no changes 16. Seizure prophylaxis: completed keppra course 17. Endstage OA left knee: Followed by Dr. Wynelle Link injection Jan (trying to avoid surgery)             --OA moderate on xray at admit             --Ice prn. Conitnue Voltaren gel tid. observe with activity  18. Azotemia  -BUN still elevated at 32. Push fluids! Have discussed with pt  -will need to work on at home 19. Epistaxis -improved  -Saline nose spray      LOS: 14 days A FACE TO FACE EVALUATION WAS PERFORMED  Meredith Staggers 09/15/2021, 9:00 AM

## 2021-09-15 NOTE — Progress Notes (Signed)
Patient ID: Jennifer Porter, female   DOB: April 03, 1945, 76 y.o.   MRN: [id number]  Confirms DME delivered to room. SW reviewed d/c with patient. No questions/concerns.  Loralee Pacas, MSW, [place] Office: [phone] Cell: [phone] Fax: [phone]

## 2021-09-16 LAB — GLUCOSE, CAPILLARY: Glucose-Capillary: 84 mg/dL (ref 70–99)

## 2021-09-16 NOTE — Progress Notes (Signed)
INPATIENT REHABILITATION DISCHARGE NOTE   Discharge instructions by:Dan PA  Verbalized understanding:yes  Skin care/Wound care healing?none  Pain:none  IV's:none  Tubes/Drains:none  O2:none  Safety instructions:done  Patient belongings:done  Discharged PH:QNET  Discharged via: Wheelchair by NT Notes:

## 2021-09-16 NOTE — Progress Notes (Signed)
Inpatient Rehabilitation Care Coordinator Discharge Note   Patient Details  Name: Jennifer Porter MRN: [id number] Date of Birth: 03/18/1946   Discharge location: D/c to home with husband  Length of Stay: 14 days  Discharge activity level: Supervision  Home/community participation: Limited  Patient response NT:ZGYFVC Literacy - How often do you need to have someone help you when you read instructions, pamphlets, or other written material from your doctor or pharmacy?: Never  Patient response BS:WHQPRF Isolation - How often do you feel lonely or isolated from those around you?: Never  Services provided included: MD, RD, PT, OT, SLP, RN, Pharmacy, Neuropsych, SW, CM, TR  Financial Services:  Charity fundraiser Utilized: [place] [employer]  Choices offered to/list presented to: Yes  Follow-up services arranged:  Outpatient, DME    Outpatient Servicies: [facility] for outpatient PT/OT DME : [place] bedside commode, rolling walker with left platform attachment   Patient response to transportation need: Is the patient able to respond to transportation needs?: Yes In the past 12 months, has lack of transportation kept you from medical appointments or from getting medications?: No In the past 12 months, has lack of transportation kept you from meetings, work, or from getting things needed for daily living?: No  Comments (or additional information):  Patient/Family verbalized understanding of follow-up arrangements:  Yes  Individual responsible for coordination of the follow-up plan: contact pt  Confirmed correct DME delivered: Rana Snare 09/16/2021    Rana Snare

## 2021-09-16 NOTE — Progress Notes (Signed)
Ortho Note  I have reviewed x-rays and they are stable. Order placed for removable wrist splint. Please have ortho tech remove current plaster splint and transition to removable brace.  Shona Needles, MD [facility] [phone] (office) [email or web address]

## 2021-09-16 NOTE — Progress Notes (Signed)
Orthopedic Tech Progress Note Patient Details:  MELANYE HIRALDO 16-May-1945 [id number]  Ortho Devices Type of Ortho Device: Velcro wrist forearm splint Ortho Device/Splint Location: LUE Ortho Device/Splint Interventions: Adjustment, Application, Ordered   Post Interventions Patient Tolerated: Well  Pharrell Ledford A Labria Wos 09/16/2021, 9:35 AM

## 2021-09-16 NOTE — Progress Notes (Signed)
PROGRESS NOTE   Subjective/Complaints: Pt is in good spirits. Husband coming to pick her up this am  ROS: Patient denies fever, rash, sore throat, blurred vision, dizziness, nausea, vomiting, diarrhea, cough, shortness of breath or chest pain, joint or back/neck pain, headache, or mood change.     Objective:   DG HIP UNILAT WITH PELVIS 2-3 VIEWS LEFT  Result Date: 09/15/2021 CLINICAL DATA:  Left hip surgery 3 weeks ago EXAM: DG HIP (WITH OR WITHOUT PELVIS) 2-3V LEFT COMPARISON:  08/27/2021 FINDINGS: Postsurgical changes from left femur ORIF of intertrochanteric left femur fracture. Alignment is unchanged compared to the immediate postoperative images including mildly displaced lesser trochanteric fragment. No new fractures. Hip joint intact without dislocation. Previously seen soft tissue air has resolved. IMPRESSION: Satisfactory appearance of the left hip following recent ORIF. Electronically Signed   By: Davina Poke D.O.   On: 09/15/2021 15:46   DG Wrist Complete Left  Result Date: 09/15/2021 CLINICAL DATA:  76 year old female status post left wrist ORIF. EXAM: LEFT WRIST - COMPLETE 3+ VIEW COMPARISON:  08/27/2021, 08/26/2021 FINDINGS: Postsurgical changes after left wrist ORIF with plate and screw apparatus about the volar aspect, unchanged from comparison, without evidence of hardware loosening, fracture, or malalignment. Persistent anatomic alignment of distal radial fracture. Unchanged minimally displaced ulnar styloid fracture. Similar appearing first carpometacarpal joint degenerative change. No new fracture or malalignment. Fine osseous details slightly limited by overlying cast material. IMPRESSION: Similar appearance of postsurgical changes after left distal radial ORIF, no complicating features. Electronically Signed   By: Ruthann Cancer M.D.   On: 09/15/2021 15:43    No results for input(s): "WBC", "HGB", "HCT", "PLT" in  the last 72 hours.   No results for input(s): "NA", "K", "CL", "CO2", "GLUCOSE", "BUN", "CREATININE", "CALCIUM" in the last 72 hours.    Intake/Output Summary (Last 24 hours) at 09/16/2021 0910 Last data filed at 09/16/2021 0730 Gross per 24 hour  Intake 720 ml  Output --  Net 720 ml     Pressure Injury 09/10/21 Coccyx Bilateral Stage 1 -  Intact skin with non-blanchable redness of a localized area usually over a bony prominence. (Active)  09/10/21 2206  Location: Coccyx  Location Orientation: Bilateral  Staging: Stage 1 -  Intact skin with non-blanchable redness of a localized area usually over a bony prominence.  Wound Description (Comments):   Present on Admission: No    Physical Exam: Vital Signs Blood pressure (!) 113/55, pulse 81, temperature 97.6 F (36.4 C), resp. rate 15, height '5\' 4"'$  (1.626 m), weight 59 kg, SpO2 98 %.   Constitutional: No distress . Vital signs reviewed. HEENT: NCAT, EOMI, oral membranes moist Neck: supple Cardiovascular: RRR without murmur. No JVD    Respiratory/Chest: CTA Bilaterally without wheezes or rales. Normal effort    GI/Abdomen: BS +, non-tender, non-distended Ext: no clubbing, cyanosis, or edema Psych: pleasant and cooperative  Skin: Coccyx wound.   Left hip wound is CDI. Left forearm incision remains cdi with steristrips.  Neuro: Alert and oriented x3 with reasonable insight and awareness.  Functional memory.  Normal speech and language.  No cranial nerve findings.  RUE 5/5.  LUE 4- to 4/5 however  limited at forearm due to splint.  LLE 3+/5 proximal to 4 -/5 distally Musculoskeletal: Left hip, coccyx and forearm pain.  Left forearm is wrapped with ACE/splint--remains sl tender, splint fiting  Assessment/Plan: 1. Functional deficits which require 3+ hours per day of interdisciplinary therapy in a comprehensive inpatient rehab setting. Physiatrist is providing close team supervision and 24 hour management of active medical problems  listed below. Physiatrist and rehab team continue to assess barriers to discharge/monitor patient progress toward functional and medical goals  Care Tool:  Bathing    Body parts bathed by patient: Right arm, Left arm, Chest, Abdomen, Front perineal area, Right upper leg, Buttocks, Left upper leg, Right lower leg, Face, Left lower leg   Body parts bathed by helper: Left arm (helper assisted with L under arm and back) Body parts n/a: Right lower leg, Left lower leg, Buttocks, Right upper leg, Left upper leg (had already washed LB earlier)   Bathing assist Assist Level: Supervision/Verbal cueing     Upper Body Dressing/Undressing Upper body dressing   What is the patient wearing?: Pull over shirt    Upper body assist Assist Level: Set up assist    Lower Body Dressing/Undressing Lower body dressing      What is the patient wearing?: Underwear/pull up, Pants     Lower body assist Assist for lower body dressing: Supervision/Verbal cueing     Toileting Toileting    Toileting assist Assist for toileting: Supervision/Verbal cueing     Transfers Chair/bed transfer  Transfers assist     Chair/bed transfer assist level: Supervision/Verbal cueing     Locomotion Ambulation   Ambulation assist      Assist level: Supervision/Verbal cueing Assistive device: Walker-platform Max distance: 150'   Walk 10 feet activity   Assist     Assist level: Supervision/Verbal cueing Assistive device: Walker-platform   Walk 50 feet activity   Assist Walk 50 feet with 2 turns activity did not occur: Safety/medical concerns  Assist level: Supervision/Verbal cueing Assistive device: Walker-platform    Walk 150 feet activity   Assist Walk 150 feet activity did not occur: Safety/medical concerns  Assist level: Supervision/Verbal cueing Assistive device: Walker-platform    Walk 10 feet on uneven surface  activity   Assist Walk 10 feet on uneven surfaces activity did  not occur: Safety/medical concerns   Assist level: Supervision/Verbal cueing Assistive device: Walker-platform   Wheelchair     Assist Is the patient using a wheelchair?: No             Wheelchair 50 feet with 2 turns activity    Assist            Wheelchair 150 feet activity     Assist          Blood pressure (!) 113/55, pulse 81, temperature 97.6 F (36.4 C), resp. rate 15, height '5\' 4"'$  (1.626 m), weight 59 kg, SpO2 98 %. Medical Problem List and Plan: 1. Functional deficits secondary to polytrauma with SAH left convexity , small parafalcine SDH, left IT hip fracture and left distal radius fracture             -dc home today  -f/u with [facility] and ortho   2.  Antithrombotics: -DVT/anticoagulation:  Pharmaceutical: Lovenox--Eliquis at d/c.             -antiplatelet therapy: N/A 3. Pain Management: Continue tylenol tid--  650 mg, Gabapentin '300mg'$  HS             -continue  robaxin TID with oxycodone prn.   Now on hydrocodone , no c/o nausea  4. Mood/Sleep:  LCSW to follow for evaluation and support.              -antipsychotic agents: N/A 5. Neuropsych/cognition: This patient is capable of making decisions on his own behalf. 6. Skin/Wound Care: Routine pressure relief measures. donut cushion for chair, RIght lateral decubitus position for bed   -continue measures, nutrition at home 7. Fluids/Electrolytes/Nutrition: encourage PO  -hypomagnesia    -continue oral mag supp daily   --add protein supp for low albumin 8. Left distal radius Fx s/p ORIF 6/9: NWB left wrist but can WBAT thorough left elbow             --continue splint left forearm.   6/29. Xrays stable. Ortho has ordered removable splint   9. Left IT hip Fx s/p IM nailing: WBAT 10 ABLA:                -HGB 9.2 6/11--holding at 8.8 6/15  -added Fe++ supp  -HGB 8.4 6/19, recheck CBC  -6/21 HBG up to 8.9 11. T2DM: Hgb A1c- 6.4. Will monitor BS ac/hs and use SSI for elevated BS/wound healing.               --Resume levemir 5 units at bedtime and monitor BS.  --Continue to hold metformin for now.  -CBG (last 3)  Recent Labs    09/15/21 1639 09/15/21 2024 09/16/21 0603  GLUCAP 108* 181* 84  Controlled 6/28=9 12. Small SDH/SAH: No follow up per NS. Monitor for any neurological changes.  13. Vitamin D insufficiency: 24.98-->add ergocalciferol for supplement.  14. Chronic pancreatitis: Followed by Dr. Gala Romney and recent flare 07/2021? --Abdominal pain/diarrhea managed w/creon and prn bentyl.  --  miralax prn.  15. HTN: Monitor BP TID. Decrease Lisinopril to '10mg'$     09/16/2021    3:29 AM 09/15/2021    7:29 PM 09/15/2021    4:10 PM  Vitals with BMI  Systolic 500 938 182  Diastolic 55 53 50  Pulse 81 81 88   6/29 BP well controlled, continue lisinopril '10mg'$ , no changes 16. Seizure prophylaxis: completed keppra course 17. Endstage OA left knee: Followed by Dr. Wynelle Link injection Jan (trying to avoid surgery)             --OA moderate on xray at admit             --Ice prn. Conitnue Voltaren gel tid. observe with activity  18. Azotemia  -have discussed pushing fluids at home 19. Epistaxis -resolved  -Saline nose spray      LOS: 15 days A FACE TO FACE EVALUATION WAS PERFORMED  Meredith Staggers 09/16/2021, 9:10 AM

## 2021-09-22 ENCOUNTER — Encounter (HOSPITAL_COMMUNITY): Payer: Self-pay | Admitting: Occupational Therapy

## 2021-09-22 ENCOUNTER — Ambulatory Visit (HOSPITAL_COMMUNITY): Payer: Medicare Other | Attending: Physician Assistant

## 2021-09-22 ENCOUNTER — Encounter (HOSPITAL_COMMUNITY): Payer: Self-pay

## 2021-09-22 ENCOUNTER — Ambulatory Visit (HOSPITAL_COMMUNITY): Payer: Medicare Other | Admitting: Occupational Therapy

## 2021-09-22 DIAGNOSIS — M25632 Stiffness of left wrist, not elsewhere classified: Secondary | ICD-10-CM

## 2021-09-22 DIAGNOSIS — M25532 Pain in left wrist: Secondary | ICD-10-CM

## 2021-09-22 DIAGNOSIS — M6281 Muscle weakness (generalized): Secondary | ICD-10-CM | POA: Diagnosis not present

## 2021-09-22 DIAGNOSIS — R29898 Other symptoms and signs involving the musculoskeletal system: Secondary | ICD-10-CM | POA: Diagnosis not present

## 2021-09-22 DIAGNOSIS — R262 Difficulty in walking, not elsewhere classified: Secondary | ICD-10-CM | POA: Diagnosis not present

## 2021-09-22 DIAGNOSIS — M25552 Pain in left hip: Secondary | ICD-10-CM | POA: Diagnosis not present

## 2021-09-22 DIAGNOSIS — T1490XA Injury, unspecified, initial encounter: Secondary | ICD-10-CM | POA: Diagnosis not present

## 2021-09-22 DIAGNOSIS — M25652 Stiffness of left hip, not elsewhere classified: Secondary | ICD-10-CM | POA: Insufficient documentation

## 2021-09-22 NOTE — Therapy (Signed)
OUTPATIENT OCCUPATIONAL THERAPY ORTHO EVALUATION  Patient Name: Jennifer Porter MRN: [id number] DOB:02-Aug-1945, 76 y.o., female Today's Date: 09/22/2021  PCP: Dr. Celene Squibb REFERRING PROVIDER: Lauraine Rinne, PA-C (surgeon is Dr. Katha Hamming)   OT End of Session - 09/22/21 1447     Visit Number 1    Number of Visits 16    Date for OT Re-Evaluation 11/21/21   Mini-reassessment 10/20/2021   Authorization Type 1) BCBS 2) [employer] Medicare; $30 copay    Progress Note Due on Visit 10    OT Start Time 1348    OT Stop Time 1427    OT Time Calculation (min) 39 min    Activity Tolerance Patient tolerated treatment well    Behavior During Therapy WFL for tasks assessed/performed             Past Medical History:  Diagnosis Date   Allergy    seasonal   Asthma    Chronic pancreatitis ([place])    Complication of anesthesia    Diabetes mellitus without complication ([place])    GERD (gastroesophageal reflux disease)    hiatal hernia   H/O: gout    Hyperlipidemia    Hypertension    Pancreatitis    PONV (postoperative nausea and vomiting)    Past Surgical History:  Procedure Laterality Date   ABDOMINAL HYSTERECTOMY     APPENDECTOMY     Arthroscopic Left Knee Left 03/22/2003   CHOLECYSTECTOMY     in [place], 2010, without polyps   ESOPHAGOGASTRODUODENOSCOPY (EGD) WITH PROPOFOL N/A 11/08/2018   Procedure: ESOPHAGOGASTRODUODENOSCOPY (EGD) WITH PROPOFOL;  Surgeon: [name], MD;  [facility];  Service: Endoscopy;  Laterality: N/A;   EUS N/A 11/08/2018   Procedure: UPPER ENDOSCOPIC ULTRASOUND (EUS) RADIAL;  Surgeon: [name], MD;  Location: [facility];  Service: Endoscopy;  Laterality: N/A;   INTRAMEDULLARY (IM) NAIL INTERTROCHANTERIC Left 08/27/2021   Procedure: INTRAMEDULLARY (IM) NAIL INTERTROCHANTRIC;  Surgeon: Shona Needles, MD;  Location: [place];  Service: Orthopedics;  Laterality: Left;   ORIF WRIST FRACTURE Left 08/27/2021    Procedure: OPEN REDUCTION INTERNAL FIXATION (ORIF) WRIST FRACTURE;  Surgeon: Shona Needles, MD;  Location: [place];  Service: Orthopedics;  Laterality: Left;   Patient Active Problem List   Diagnosis Date Noted   Pressure injury of skin 09/12/2021   Trauma 09/01/2021   Subdural hematoma (HCC)    Subarachnoid hemorrhage (HCC)    Acute blood loss anemia    Multiple injuries due to trauma 08/26/2021   Colon cancer screening 07/29/2021   Hyperlipidemia associated with type 2 diabetes mellitus ([place]) 11/28/2020   Acute recurrent pansinusitis 07/20/2020   Urinary retention 01/15/2020   Urinary urgency 01/15/2020   Abdominal cramping 08/21/2019   Protein-calorie malnutrition ([place]) 02/18/2019   Chronic pancreatitis ([place]) 08/24/2018   Loose stools 08/24/2018   Pancreatic calcification 08/22/2018   Acute pancreatitis without infection or necrosis 08/09/2018   Sigmoid diverticulosis 08/09/2018   3-vessel CAD 08/09/2018   Aortic atherosclerosis ([place]) 08/09/2018   Myelolipoma of right adrenal gland 08/09/2018   Renal cyst, right 08/09/2018   Hormone replacement therapy 01/12/2017   Hot flashes due to menopause 01/12/2017   Hypomagnesemia 06/27/2016   Diarrhea 03/28/2016   Vitamin D deficiency 10/03/2014   Left knee pain 07/02/2014   Hypertension    Gastroesophageal reflux disease    Type 2 diabetes mellitus without complication ([place])    Allergy    Asthma  H/O: gout    Hyperlipidemia     ONSET DATE: 08/26/21  REFERRING DIAG: Trauma-s/p ORIF of left distal radius fracture  THERAPY DIAG:  Pain in left wrist  Stiffness of left wrist, not elsewhere classified  Other symptoms and signs involving the musculoskeletal system  Rationale for Evaluation and Treatment Rehabilitation  SUBJECTIVE:   SUBJECTIVE STATEMENT: S: I've been getting around at home, I just got out of the hospital a week ago.  Pt accompanied by: self  PERTINENT HISTORY: Pt is a 76 y/o female s/p left wrist ORIF  on 08/27/21 after falling backwards and landing on her left side on 08/26/21. Pt also with left femur fx and SAH/SDH. Pt presents with platform walker, pre-fabricated wrist splint, elbow pad. During evaluation, staff calling surgeon's office as no clearance for wrist mobility yet, surgeon's office stating pt clear for OT and PT on LUE and LLE, sending additional referral.   PRECAUTIONS: Other: Per surgeon's office: no protocol, NWB through left wrist. Progress as tolerated.    WEIGHT BEARING RESTRICTIONS Yes NWB through left wrist  PAIN:  Are you having pain? No  FALLS: Has patient fallen in last 6 months? Yes. Number of falls 1  LIVING ENVIRONMENT: Lives with: lives with their spouse Lives in: House/apartment Stairs: Yes: External: 1 steps; on left going up Has following equipment at home: bed side commode and platform walker  PLOF: Independent  PATIENT GOALS To return to independence.   OBJECTIVE:   HAND DOMINANCE: Right  ADLs: Overall ADLs: Pt is unable to use LUE during ADLs, has difficulty with dressing, is unable to hook bra with just one hand. Pt is having difficulty with bathing tasks, meal preparation.    FUNCTIONAL OUTCOME MEASURES: Quick Dash: 75  UPPER EXTREMITY ROM      Assessed seated, gravity eliminated with exception of wrist extension-against gravity   Active ROM Left eval  Wrist flexion 32  Wrist extension 24  Wrist ulnar deviation 12  Wrist radial deviation 18  Wrist pronation 90  Wrist supination 75  (Blank rows = not tested)    UPPER EXTREMITY MMT:      Not tested due to pain  MMT Left eval  Elbow flexion   Elbow extension   Wrist flexion   Wrist extension   Wrist ulnar deviation   Wrist radial deviation   Wrist pronation   Wrist supination   (Blank rows = not tested)  HAND FUNCTION: Grip strength: Right: 40 lbs; Left: 0 lbs, Lateral pinch: Right: 12 lbs, Left: 0 lbs, and 3 point pinch: Right: 10 lbs, Left: 0 lbs  COORDINATION: 9  Hole Peg test: Right: 19.0" sec; Left: 39.0" sec  SENSATION: WFL  EDEMA: None  COGNITION: Overall cognitive status: Within functional limits for tasks assessed    PATIENT EDUCATION: Education details: Continue with finger HEP from CIR, begin light washcloth squeezes for grip strengthening Person educated: Patient Education method: Explanation Education comprehension: verbalized understanding   HOME EXERCISE PROGRAM: Eval: continue CIR finger ROM, washcloth squeeze  GOALS: Goals reviewed with patient? Yes  SHORT TERM GOALS: Target date: 10/20/2021    Pt will be provided with and educated on HEP to improve mobility of left wrist required for use during ADLs.   Goal status: INITIAL  2.  Pt will increase left wrist A/ROM to 35 degrees or greater to improve ability to perform dressing and bathing tasks using LUE as non-dominant.   Goal status: INITIAL  3.  Pt will increase left wrist strength  to 3/5 to improve ability to use LUE as assist when completing simple meal preparation tasks.   Goal status: INITIAL  4.  Pt will increase left grip strength to 15# and pinch strength to 4# to improve ability to hold and use tools for grooming such as brushes, hair dryers, etc.    Goal status: INITIAL   LONG TERM GOALS: Target date: 11/17/2021    Pt will decrease pain in LUE to 3/10 or less to improve ability to use as non-dominant during daily task completion.   Goal status: INITIAL  2.  Pt will increase left wrist A/ROM to 50 degrees or greater to improve ability to use LUE to complete laundry tasks.   Goal status: INITIAL  3.  Pt will increase left wrist strength to 4/5 or greater to improve ability to complete heavy housework tasks such as vacuuming.   Goal status: INITIAL  4.  Pt will increase left grip strength to 25# and pinch strength to 8# or greater to improve ability to maintain hold on pots and pans during meal preparation.  Goal status:  INITIAL   ASSESSMENT:  CLINICAL IMPRESSION: Patient is a 76 y.o. female who was seen today for occupational therapy evaluation s/p left wrist ORIF on 08/27/21 after sustaining a fall on 08/26/21.  Pt verbally cleared for OT services by surgeon during session as pt was referred by physical med/rehab MD but has not followed up with surgeon; surgeon's office also placing referral order. Pt with no edema today, great finger and elbow ROM. Instructed pt to continue with CIR finger and hand exercises, will update HEP at next session.   PERFORMANCE DEFICITS in functional skills including ADLs, IADLs, coordination, dexterity, edema, ROM, strength, pain, fascial restrictions, and UE functional use  IMPAIRMENTS are limiting patient from ADLs, IADLs, rest and sleep, and leisure.   COMORBIDITIES may have co-morbidities  that affects occupational performance. Patient will benefit from skilled OT to address above impairments and improve overall function.  MODIFICATION OR ASSISTANCE TO COMPLETE EVALUATION: Min-Moderate modification of tasks or assist with assess necessary to complete an evaluation.  OT OCCUPATIONAL PROFILE AND HISTORY: Detailed assessment: Review of records and additional review of physical, cognitive, psychosocial history related to current functional performance.  CLINICAL DECISION MAKING: LOW - limited treatment options, no task modification necessary  REHAB POTENTIAL: Good  EVALUATION COMPLEXITY: Low      PLAN: OT FREQUENCY: 2x/week  OT DURATION: 8 weeks  PLANNED INTERVENTIONS: self care/ADL training, therapeutic exercise, therapeutic activity, manual therapy, passive range of motion, splinting, electrical stimulation, ultrasound, moist heat, cryotherapy, patient/family education, and DME and/or AE instructions  CONSULTED AND AGREED WITH PLAN OF CARE: Patient  PLAN FOR NEXT SESSION: Begin gentle passive stretching, wrist A/ROM, update HEP   Guadelupe Sabin, OTR/L   [phone] 09/22/2021, 3:03 PM

## 2021-09-22 NOTE — Therapy (Signed)
OUTPATIENT PHYSICAL THERAPY LOWER EXTREMITY EVALUATION   Patient Name: Jennifer Porter MRN: [id number] DOB:[birth year], 76 y.o., female Today's Date: 09/22/2021   PT End of Session - 09/22/21 1417     Visit Number 1    Number of Visits 16    Date for PT Re-Evaluation 11/23/21    Authorization Type BCBS state - ded met; 30 copay; no coins; no auth needed    PT Start Time 1430    PT Stop Time 1512    PT Time Calculation (min) 42 min    Activity Tolerance Patient tolerated treatment well    Behavior During Therapy WFL for tasks assessed/performed             Past Medical History:  Diagnosis Date   Allergy    seasonal   Asthma    Chronic pancreatitis ([place])    Complication of anesthesia    Diabetes mellitus without complication ([place])    GERD (gastroesophageal reflux disease)    hiatal hernia   H/O: gout    Hyperlipidemia    Hypertension    Pancreatitis    PONV (postoperative nausea and vomiting)    Past Surgical History:  Procedure Laterality Date   ABDOMINAL HYSTERECTOMY     APPENDECTOMY     Arthroscopic Left Knee Left 03/22/2003   CHOLECYSTECTOMY     in [place], 2010, without polyps   ESOPHAGOGASTRODUODENOSCOPY (EGD) WITH PROPOFOL N/A 11/08/2018   Procedure: ESOPHAGOGASTRODUODENOSCOPY (EGD) WITH PROPOFOL;  Surgeon: [name], MD;  [facility];  Service: Endoscopy;  Laterality: N/A;   EUS N/A 11/08/2018   Procedure: UPPER ENDOSCOPIC ULTRASOUND (EUS) RADIAL;  Surgeon: [name], MD;  [facility];  Service: Endoscopy;  Laterality: N/A;   INTRAMEDULLARY (IM) NAIL INTERTROCHANTERIC Left 08/27/2021   Procedure: INTRAMEDULLARY (IM) NAIL INTERTROCHANTRIC;  Surgeon: Shona Needles, MD;  Location: [place];  Service: Orthopedics;  Laterality: Left;   ORIF WRIST FRACTURE Left 08/27/2021   Procedure: OPEN REDUCTION INTERNAL FIXATION (ORIF) WRIST FRACTURE;  Surgeon: Shona Needles, MD;  Location: [place];  Service:  Orthopedics;  Laterality: Left;   Patient Active Problem List   Diagnosis Date Noted   Pressure injury of skin 09/12/2021   Trauma 09/01/2021   Subdural hematoma (HCC)    Subarachnoid hemorrhage (HCC)    Acute blood loss anemia    Multiple injuries due to trauma 08/26/2021   Colon cancer screening 07/29/2021   Hyperlipidemia associated with type 2 diabetes mellitus ([place]) 11/28/2020   Acute recurrent pansinusitis 07/20/2020   Urinary retention 01/15/2020   Urinary urgency 01/15/2020   Abdominal cramping 08/21/2019   Protein-calorie malnutrition ([place]) 02/18/2019   Chronic pancreatitis ([place]) 08/24/2018   Loose stools 08/24/2018   Pancreatic calcification 08/22/2018   Acute pancreatitis without infection or necrosis 08/09/2018   Sigmoid diverticulosis 08/09/2018   3-vessel CAD 08/09/2018   Aortic atherosclerosis ([place]) 08/09/2018   Myelolipoma of right adrenal gland 08/09/2018   Renal cyst, right 08/09/2018   Hormone replacement therapy 01/12/2017   Hot flashes due to menopause 01/12/2017   Hypomagnesemia 06/27/2016   Diarrhea 03/28/2016   Vitamin D deficiency 10/03/2014   Left knee pain 07/02/2014   Hypertension    Gastroesophageal reflux disease    Type 2 diabetes mellitus without complication ([place])    Allergy    Asthma    H/O: gout    Hyperlipidemia     PCP: Celene Squibb, MD  REFERRING PROVIDER: Donia Guiles,  Lavon Paganini, PA-C   and verbal from surgeon Haddix, Thomasene Lot, MD  REFERRING DIAG: Trauma   THERAPY DIAG:  Pain in left hip  Stiffness of left hip, not elsewhere classified  Difficulty in walking, not elsewhere classified  Muscle weakness (generalized)  Rationale for Evaluation and Treatment Rehabilitation  ONSET DATE: 08/26/2021 trauma, 08/27/2021 surgery L hip ORIF  SUBJECTIVE:   SUBJECTIVE STATEMENT: At night, let dogs out to use bathroom before bed and there was an opposum on back porch and dog went after it.  Trying to get dog away and she started to  fall, fell backward onto left side, left hip, left wrist, hit head.  Went to ED at [facility] that night with help from neighbors and then next day transferred to [facility] had surgery where same surgeon did both procedures.  Small head trauma, speech therapy in hospital cleared, no neck continued pain, no head concerns anymore.  2 weeks of inpatient rehab, walking every hour with 2 wheeled walker; has practice steps also at rehab as well as HEP.  L knee has history of pain, every 4 months getting injections and states "probably needs to be replaced".  Taking muscle relaxors and tylenol for pain, taking up to 4x per day per MD, took before coming  PERTINENT HISTORY: L hip ORIF surgery 08/27/21 L wrist ORIF sugery 08/27/21 L knee arthritis  DM - denies N/T into feet currently, reports intermittently does get these symptoms present before trauma Hx of back fracture - no current back symptoms  Chronic pancreatitis, gallbladder removed, multiple comorbidities  PAIN:  Are you having pain? No  PRECAUTIONS: Fall  WEIGHT BEARING RESTRICTIONS  WBAT L LE - (of note, NWB to L UE being treated by OT but please advise for PT treatments)  FALLS:  Has patient fallen in last 6 months? Yes. Number of falls 1 this current trauma  LIVING ENVIRONMENT: Lives with: lives with their spouse Lives in: House/apartment Stairs: Yes: External: 1 steps; modified with a ramp currently Has following equipment at home: Walker - 2 wheeled and elevated toilet commode seat over regular toilet  OCCUPATION: retired Education officer, [employer]; very active around the house and with husband  PLOF: Independent  PATIENT GOALS "get back to be able to do what I usually do"   OBJECTIVE:   DIAGNOSTIC FINDINGS: Quoted from MD note upon hospital discharge  ''DG [place] 2-3 VIEWS LEFT  Result Date: 09/15/2021 CLINICAL DATA:  Left hip surgery 3 weeks ago EXAM: DG HIP (WITH OR WITHOUT PELVIS) 2-3V LEFT COMPARISON:   08/27/2021 FINDINGS: Postsurgical changes from left femur ORIF of intertrochanteric left femur fracture. Alignment is unchanged compared to the immediate postoperative images including mildly displaced lesser trochanteric fragment. No new fractures. Hip joint intact without dislocation. Previously seen soft tissue air has resolved. IMPRESSION: Satisfactory appearance of the left hip following recent ORIF. Electronically Signed   By: Davina Poke D.O.   On: 09/15/2021 15:46 "  PATIENT SURVEYS:  FOTO 33  COGNITION:  Overall cognitive status: Within functional limits for tasks assessed     SENSATION: WFL  EDEMA:  Circumferential: ankle circ L 24 cm verse R 23 cm with visibule edema at left foot  MUSCLE LENGTH: NT secondary to post op  POSTURE: forward head, flexed trunk , and weight shift right  PALPATION: TTP at gross left hip into quad, left knee with light touch  LOWER EXTREMITY ROM:  Active ROM Right eval Left eval  Hip flexion  Hip extension    Hip abduction    Hip adduction    Hip internal rotation    Hip external rotation    Knee flexion WNL WNL  Knee extension WNL WNL  Ankle dorsiflexion WNL WNL  Ankle plantarflexion WNL WNL  Ankle inversion    Ankle eversion     (Blank rows = not tested)  LOWER EXTREMITY MMT: modified all testing in sitting secondary to gross trauma/post op comfort  MMT Right eval Left eval  Hip flexion 4+ 2+  Hip extension 4   Hip abduction 4+ 2+  Hip adduction 4+ 3  Hip internal rotation    Hip external rotation    Knee flexion 4+ 3  Knee extension 4+ 3  Ankle dorsiflexion 4+ 3  Ankle plantarflexion 4+ 3  Ankle inversion    Ankle eversion     (Blank rows = not tested)  LOWER EXTREMITY SPECIAL TESTS:  Hip special tests: NA secondary to post op   FUNCTIONAL TESTS:  2 minute walk test: 90 feet with notes in below section  GAIT: Distance walked: 90 feet Assistive device utilized: Walker - 2 wheeled and platform for L  UE Level of assistance: SBA Comments: Decreased stride length and decreased push off on L LE, small step through pattern limited flexion use    TODAY'S TREATMENT: 09/22/21 - Evaluation, FOTO, measurements and HEP as below  Sitting There-ex = heel toe raise x 10, hip add iso pillow x 10, hip abd AROM x 10    PATIENT EDUCATION:  Education details: 09/22/21 - evaluation findings, POC, PT scope of practice, education on edema management and DVT risk, HEP Person educated: Patient Education method: Explanation, Demonstration, and Handouts Education comprehension: verbalized understanding   HOME EXERCISE PROGRAM: Access Code: [email or web address] URL: [email or web address] Date: 09/22/2021 Prepared by: Jerilynn Som  Exercises - Seated March  - 2 x daily - 7 x weekly - 2 sets - 10 reps - Seated Isometric Hip Adduction with Ball  - 2 x daily - 7 x weekly - 2 sets - 10 reps - Seated Single Leg Hip Abduction  - 2 x daily - 7 x weekly - 2 sets - 10 reps - Seated Heel Toe Raises  - 3-5 x daily - 7 x weekly - 2 sets - 10 reps  ASSESSMENT:  CLINICAL IMPRESSION: Patient is a 76 y.o. female who was seen today for physical therapy evaluation and treatment for Left hip ORIF post surgical support.  Patient reports a fall on 08/26/21 off porch causing multiple regions of trauma including a fracture left femur that physical therapy will support with post operative rehabilitation from surgery done 08/27/21.  Currently, she is walking with a front wheeled walker with left forearm platform with slow ambulation.  She has overall tenderness and weakness around left hip and knee and overall deconditioning as show in minimal distance in 2 minute walk test.  Patient was prior independent in function and is a good candidate for skilled physical therapy to return to independence while progressing L LE strength and post operative needs.     OBJECTIVE IMPAIRMENTS Abnormal gait, decreased activity tolerance, decreased  balance, decreased endurance, decreased mobility, difficulty walking, decreased strength, increased edema, impaired UE functional use, postural dysfunction, and pain.   ACTIVITY LIMITATIONS carrying, lifting, bending, standing, squatting, stairs, bathing, toileting, locomotion level, and caring for others  PARTICIPATION LIMITATIONS: meal prep, cleaning, laundry, driving, community activity, and yard work  PERSONAL FACTORS Age, Transportation, and 3+ comorbidities: DM, L UE  trauma, L knee arthritis/pain chronic  are also affecting patient's functional outcome.   REHAB POTENTIAL: Good  CLINICAL DECISION MAKING: Evolving/moderate complexity  EVALUATION COMPLEXITY: Moderate   GOALS: Goals reviewed with patient? No  SHORT TERM GOALS: Target date: 10/20/2021  Patient will be independent with initial HEP and self-management strategies to improve functional outcomes   Baseline: 09/22/21 - initiated today Goal status: INITIAL  2.  Patient will be able to demonstrate at least 4-/5 for gross L LE strength to improve functional support on L LE and progress function Baseline: 09/22/21 - see objective measures, L hip 2+/5 for hip flexion  Goal status: INITIAL  3.  Patient will be able to demonstrate ability to ambulate with equal step length and no pain in order to progress L LE functional strength and [place].  Baseline: 09/22/21 - decreased stride length left Goal status: INITIAL   LONG TERM GOALS: Target date: 11/17/21   Patient will be independent with advanced HEP and self-management strategies to improve functional outcomes   Baseline: 09/22/21 - to be established Goal status: INITIAL  2.  Patient will demonstrate improved functional abilities by improving FOTO by at least 15 points to 47 or higher.    Baseline: 09/22/21 - current 33 Goal status: INITIAL  3.  Patient will be able to ambulate without AD independently for at least 250 feet during 2 minute walk test in order to access community  distance.  Baseline: 09/22/21 - 90 feet Goal status: INITIAL    PLAN: PT FREQUENCY: 2x/week  PT DURATION: 8 weeks  PLANNED INTERVENTIONS: Therapeutic exercises, Therapeutic activity, Neuromuscular re-education, Balance training, Gait training, Patient/Family education, Joint mobilization, Stair training, Spinal mobilization, Cryotherapy, Moist heat, scar mobilization, Taping, Manual therapy, and Re-evaluation  PLAN FOR NEXT SESSION: Review goals and HEP, build L hip stabilization and slow increased L LE WB strengthening   Jamse Belfast, PT 09/22/2021, 4:18 PM

## 2021-09-24 ENCOUNTER — Ambulatory Visit (HOSPITAL_COMMUNITY): Payer: Medicare Other

## 2021-09-24 ENCOUNTER — Encounter (HOSPITAL_COMMUNITY): Payer: Self-pay

## 2021-09-24 DIAGNOSIS — R29898 Other symptoms and signs involving the musculoskeletal system: Secondary | ICD-10-CM

## 2021-09-24 DIAGNOSIS — R262 Difficulty in walking, not elsewhere classified: Secondary | ICD-10-CM

## 2021-09-24 DIAGNOSIS — M25652 Stiffness of left hip, not elsewhere classified: Secondary | ICD-10-CM

## 2021-09-24 DIAGNOSIS — M25552 Pain in left hip: Secondary | ICD-10-CM

## 2021-09-24 DIAGNOSIS — M25632 Stiffness of left wrist, not elsewhere classified: Secondary | ICD-10-CM

## 2021-09-24 DIAGNOSIS — M25532 Pain in left wrist: Secondary | ICD-10-CM

## 2021-09-24 DIAGNOSIS — T1490XA Injury, unspecified, initial encounter: Secondary | ICD-10-CM

## 2021-09-24 DIAGNOSIS — M6281 Muscle weakness (generalized): Secondary | ICD-10-CM

## 2021-09-24 NOTE — Therapy (Signed)
OUTPATIENT OCCUPATIONAL THERAPY TREATMENT NOTE   Patient Name: Jennifer Porter MRN: [id number] DOB:04-25-1945, 76 y.o., female Today's Date: 09/24/2021  PCP: Dr. Celene Squibb REFERRING PROVIDER: Lauraine Rinne, PA-C (surgeon is Dr. Katha Hamming)   OT End of Session - 09/24/21 1041     Visit Number 2    Number of Visits 16    Date for OT Re-Evaluation 11/21/21   Mini-reassessment 10/20/2021   Authorization Type 1) BCBS 2) [employer] Medicare; $30 copay    Progress Note Due on Visit 10    OT Start Time 1038    OT Stop Time 1115    OT Time Calculation (min) 37 min    Activity Tolerance Patient tolerated treatment well    Behavior During Therapy WFL for tasks assessed/performed             Past Medical History:  Diagnosis Date   Allergy    seasonal   Asthma    Chronic pancreatitis ([place])    Complication of anesthesia    Diabetes mellitus without complication ([place])    GERD (gastroesophageal reflux disease)    hiatal hernia   H/O: gout    Hyperlipidemia    Hypertension    Pancreatitis    PONV (postoperative nausea and vomiting)    Past Surgical History:  Procedure Laterality Date   ABDOMINAL HYSTERECTOMY     APPENDECTOMY     Arthroscopic Left Knee Left 03/22/2003   CHOLECYSTECTOMY     in [place], 2010, without polyps   ESOPHAGOGASTRODUODENOSCOPY (EGD) WITH PROPOFOL N/A 11/08/2018   Procedure: ESOPHAGOGASTRODUODENOSCOPY (EGD) WITH PROPOFOL;  Surgeon: [name], MD;  [facility];  Service: Endoscopy;  Laterality: N/A;   EUS N/A 11/08/2018   Procedure: UPPER ENDOSCOPIC ULTRASOUND (EUS) RADIAL;  Surgeon: [name], MD;  Location: [facility];  Service: Endoscopy;  Laterality: N/A;   INTRAMEDULLARY (IM) NAIL INTERTROCHANTERIC Left 08/27/2021   Procedure: INTRAMEDULLARY (IM) NAIL INTERTROCHANTRIC;  Surgeon: Shona Needles, MD;  Location: [place];  Service: Orthopedics;  Laterality: Left;   ORIF WRIST FRACTURE Left 08/27/2021    Procedure: OPEN REDUCTION INTERNAL FIXATION (ORIF) WRIST FRACTURE;  Surgeon: Shona Needles, MD;  Location: [place];  Service: Orthopedics;  Laterality: Left;   Patient Active Problem List   Diagnosis Date Noted   Pressure injury of skin 09/12/2021   Trauma 09/01/2021   Subdural hematoma (HCC)    Subarachnoid hemorrhage (HCC)    Acute blood loss anemia    Multiple injuries due to trauma 08/26/2021   Colon cancer screening 07/29/2021   Hyperlipidemia associated with type 2 diabetes mellitus ([place]) 11/28/2020   Acute recurrent pansinusitis 07/20/2020   Urinary retention 01/15/2020   Urinary urgency 01/15/2020   Abdominal cramping 08/21/2019   Protein-calorie malnutrition ([place]) 02/18/2019   Chronic pancreatitis ([place]) 08/24/2018   Loose stools 08/24/2018   Pancreatic calcification 08/22/2018   Acute pancreatitis without infection or necrosis 08/09/2018   Sigmoid diverticulosis 08/09/2018   3-vessel CAD 08/09/2018   Aortic atherosclerosis ([place]) 08/09/2018   Myelolipoma of right adrenal gland 08/09/2018   Renal cyst, right 08/09/2018   Hormone replacement therapy 01/12/2017   Hot flashes due to menopause 01/12/2017   Hypomagnesemia 06/27/2016   Diarrhea 03/28/2016   Vitamin D deficiency 10/03/2014   Left knee pain 07/02/2014   Hypertension    Gastroesophageal reflux disease    Type 2 diabetes mellitus without complication ([place])    Allergy    Asthma  H/O: gout    Hyperlipidemia     ONSET DATE: 08/26/21  REFERRING DIAG: Trauma-s/p ORIF of left distal radius fracture  THERAPY DIAG:  Pain in left wrist  Stiffness of left wrist, not elsewhere classified  Other symptoms and signs involving the musculoskeletal system  Rationale for Evaluation and Treatment Rehabilitation  PERTINENT HISTORY: Pt is a 76 y/o female s/p left wrist ORIF on 08/27/21 after falling backwards and landing on her left side on 08/26/21. Pt also with left femur fx and SAH/SDH. Pt presents with platform walker,  pre-fabricated wrist splint, elbow pad. During evaluation, staff calling surgeon's office as no clearance for wrist mobility yet, surgeon's office stating pt clear for OT and PT on LUE and LLE, sending additional referral.   PRECAUTIONS: Other: Per surgeon's office: no protocol, NWB through left wrist. Progress as tolerated.    SUBJECTIVE: S: I can do more with my thumb  PAIN:  Are you having pain? Yes: NPRS scale: 2/10 Pain location: ulnar wrist and base of D1 Pain description: Discomfort, ache Aggravating factors: NA Relieving factors: tylenol, muscle relaxer    OBJECTIVE:     FUNCTIONAL OUTCOME MEASURES: Quick Dash: 75   UPPER EXTREMITY ROM                  Assessed seated, gravity eliminated with exception of wrist extension-against gravity    Active ROM Left eval  Wrist flexion 32  Wrist extension 24  Wrist ulnar deviation 12  Wrist radial deviation 18  Wrist pronation 90  Wrist supination 75  (Blank rows = not tested)     UPPER EXTREMITY MMT:                  Not tested due to pain   MMT Left eval  Elbow flexion    Elbow extension    Wrist flexion    Wrist extension    Wrist ulnar deviation    Wrist radial deviation    Wrist pronation    Wrist supination    (Blank rows = not tested)   HAND FUNCTION: Grip strength: Right: 40 lbs; Left: 0 lbs, Lateral pinch: Right: 12 lbs, Left: 0 lbs, and 3 point pinch: Right: 10 lbs, Left: 0 lbs   COORDINATION: 9 Hole Peg test: Right: 19.0" sec; Left: 39.0" sec     GOALS: Goals reviewed with patient? Yes   SHORT TERM GOALS: Target date: 10/20/2021     Pt will be provided with and educated on HEP to improve mobility of left wrist required for use during ADLs.    Goal status: INITIAL   2.  Pt will increase left wrist A/ROM to 35 degrees or greater to improve ability to perform dressing and bathing tasks using LUE as non-dominant.    Goal status: INITIAL   3.  Pt will increase left wrist strength to 3/5 to  improve ability to use LUE as assist when completing simple meal preparation tasks.    Goal status: INITIAL   4.  Pt will increase left grip strength to 15# and pinch strength to 4# to improve ability to hold and use tools for grooming such as brushes, hair dryers, etc.    Goal status: INITIAL     LONG TERM GOALS: Target date: 11/17/2021     Pt will decrease pain in LUE to 3/10 or less to improve ability to use as non-dominant during daily task completion.    Goal status: INITIAL   2.  Pt will increase  left wrist A/ROM to 50 degrees or greater to improve ability to use LUE to complete laundry tasks.    Goal status: INITIAL   3.  Pt will increase left wrist strength to 4/5 or greater to improve ability to complete heavy housework tasks such as vacuuming.    Goal status: INITIAL   4.  Pt will increase left grip strength to 25# and pinch strength to 8# or greater to improve ability to maintain hold on pots and pans during meal preparation.   Goal status: INITIAL   TODAY'S TREATMENT:  09/24/21 -Wrist P/ROM: Wrist flexion/extension, pronation/supination, radial/ulnar deviation, 2x10 each movement with prolonged stretch at end range and therapeutic breaks throughout to manage pain -Grip strength: Washcloth squeeze, 1x10   PATIENT EDUCATION: Education details: HEP follow up  Person educated: Patient Education method: Explanation Education comprehension: verbalized understanding   HOME EXERCISE PROGRAM Eval: continue CIR finger ROM, washcloth squeeze    ASSESSMENT:   CLINICAL IMPRESSION: A: Pt presenting today with slight discomfort at start of session. Tolerated therapist completing passive stretching with facial grimacing and reporting a slight increase in pain. Most discomfort and limited ROM noted with wrist flexion although pt able to achieve increased ROM in flexion and extension with each repetition. WNL for passive pronation and supination with slight discomfort at end  range supination. Some slight pain in radial wrist with radial deviation. All stretches with slight hold at end range and pt's pain tolerance. Pt has good digit ROM and reports no stiffness or soreness at the elbow or shoulder. Continues with some bruising at D1 and slight pain with movement.    PLAN:   OT FREQUENCY: 2x/week  OT DURATION: 8 weeks  PLANNED INTERVENTIONS:  self care/ADL training, therapeutic exercise, therapeutic activity, manual therapy, passive range of motion, splinting, electrical stimulation, ultrasound, moist heat, cryotherapy, patient/family education, and DME and/or AE instructions  CONSULTED AND AGREED WITH PLAN OF CARE: Patient  PLAN FOR NEXT SESSION: P: Continue passive stretching, wrist A/ROM, update HEP as tolerated      Flonnie Hailstone, OTD, OTR/L [phone]  09/24/2021, 11:39 AM

## 2021-09-24 NOTE — Therapy (Signed)
OUTPATIENT PHYSICAL THERAPY LOWER EXTREMITY EVALUATION   Patient Name: Jennifer Porter MRN: [id number] DOB:[birth year], 76 y.o., female Today's Date: 09/24/2021   PT End of Session - 09/24/21 1116     Visit Number 2    Number of Visits 16    Date for PT Re-Evaluation 11/23/21    Authorization Type BCBS state - ded met; 30 copay; no coins; no auth needed    PT Start Time 1115    PT Stop Time 1150    PT Time Calculation (min) 35 min    Activity Tolerance Patient limited by fatigue    Behavior During Therapy WFL for tasks assessed/performed             Past Medical History:  Diagnosis Date   Allergy    seasonal   Asthma    Chronic pancreatitis ([place])    Complication of anesthesia    Diabetes mellitus without complication ([place])    GERD (gastroesophageal reflux disease)    hiatal hernia   H/O: gout    Hyperlipidemia    Hypertension    Pancreatitis    PONV (postoperative nausea and vomiting)    Past Surgical History:  Procedure Laterality Date   ABDOMINAL HYSTERECTOMY     APPENDECTOMY     Arthroscopic Left Knee Left 03/22/2003   CHOLECYSTECTOMY     in [place], 2010, without polyps   ESOPHAGOGASTRODUODENOSCOPY (EGD) WITH PROPOFOL N/A 11/08/2018   Procedure: ESOPHAGOGASTRODUODENOSCOPY (EGD) WITH PROPOFOL;  Surgeon: [name], MD;  [facility];  Service: Endoscopy;  Laterality: N/A;   EUS N/A 11/08/2018   Procedure: UPPER ENDOSCOPIC ULTRASOUND (EUS) RADIAL;  Surgeon: [name], MD;  [facility];  Service: Endoscopy;  Laterality: N/A;   INTRAMEDULLARY (IM) NAIL INTERTROCHANTERIC Left 08/27/2021   Procedure: INTRAMEDULLARY (IM) NAIL INTERTROCHANTRIC;  Surgeon: Shona Needles, MD;  Location: [place];  Service: Orthopedics;  Laterality: Left;   ORIF WRIST FRACTURE Left 08/27/2021   Procedure: OPEN REDUCTION INTERNAL FIXATION (ORIF) WRIST FRACTURE;  Surgeon: Shona Needles, MD;  Location: [place];  Service: Orthopedics;   Laterality: Left;   Patient Active Problem List   Diagnosis Date Noted   Pressure injury of skin 09/12/2021   Trauma 09/01/2021   Subdural hematoma (H[facility])    Subarachnoid hemorrhage ([facility])    Acute blood loss anemia    Multiple injuries due to trauma 08/26/2021   Colon cancer screening 07/29/2021   Hyperlipidemia associated with type 2 diabetes mellitus ([place]) 11/28/2020   Acute recurrent pansinusitis 07/20/2020   Urinary retention 01/15/2020   Urinary urgency 01/15/2020   Abdominal cramping 08/21/2019   Protein-calorie malnutrition ([place]) 02/18/2019   Chronic pancreatitis ([place]) 08/24/2018   Loose stools 08/24/2018   Pancreatic calcification 08/22/2018   Acute pancreatitis without infection or necrosis 08/09/2018   Sigmoid diverticulosis 08/09/2018   3-vessel CAD 08/09/2018   Aortic atherosclerosis ([place]) 08/09/2018   Myelolipoma of right adrenal gland 08/09/2018   Renal cyst, right 08/09/2018   Hormone replacement therapy 01/12/2017   Hot flashes due to menopause 01/12/2017   Hypomagnesemia 06/27/2016   Diarrhea 03/28/2016   Vitamin D deficiency 10/03/2014   Left knee pain 07/02/2014   Hypertension    Gastroesophageal reflux disease    Type 2 diabetes mellitus without complication ([place])    Allergy    Asthma    H/O: gout    Hyperlipidemia     PCP: Celene Squibb, MD  REFERRING PROVIDER: Donia Guiles,  Lavon Paganini, PA-C   and verbal from surgeon Haddix, Thomasene Lot, MD  REFERRING DIAG: Trauma   THERAPY DIAG:  Pain in left hip  Stiffness of left hip, not elsewhere classified  Difficulty in walking, not elsewhere classified  Muscle weakness (generalized)  Trauma  Rationale for Evaluation and Treatment Rehabilitation  ONSET DATE: 08/26/2021 trauma, 08/27/2021 surgery L hip ORIF  SUBJECTIVE:   TODAY'S STATEMENT: 09/24/21 - Doing okay, not too much discomfort in region during exercises, is good about doing foot movement throughout day and did HEP already once this am.    SUBJECTIVE STATEMENT from evaluation : At night, let dogs out to use bathroom before bed and there was an opposum on back porch and dog went after it.  Trying to get dog away and she started to fall, fell backward onto left side, left hip, left wrist, hit head.  Went to ED at [facility] that night with help from neighbors and then next day transferred to [facility] had surgery where same surgeon did both procedures.  Small head trauma, speech therapy in hospital cleared, no neck continued pain, no head concerns anymore.  2 weeks of inpatient rehab, walking every hour with 2 wheeled walker; has practice steps also at rehab as well as HEP.  L knee has history of pain, every 4 months getting injections and states "probably needs to be replaced".  Taking muscle relaxors and tylenol for pain, taking up to 4x per day per MD, took before coming  PERTINENT HISTORY: L hip ORIF surgery 08/27/21 L wrist ORIF sugery 08/27/21 L knee arthritis  DM - denies N/T into feet currently, reports intermittently does get these symptoms present before trauma Hx of back fracture - no current back symptoms  Chronic pancreatitis, gallbladder removed, multiple comorbidities  PAIN:  Are you having pain? No at rest in sitting   PRECAUTIONS: Fall  WEIGHT BEARING RESTRICTIONS  WBAT L LE - (of note, NWB to L UE being treated by OT but please advise for PT treatments)  FALLS:  Has patient fallen in last 6 months? Yes. Number of falls 1 this current trauma  LIVING ENVIRONMENT: Lives with: lives with their spouse Lives in: House/apartment Stairs: Yes: External: 1 steps; modified with a ramp currently Has following equipment at home: Walker - 2 wheeled and elevated toilet commode seat over regular toilet  OCCUPATION: retired Education officer, [employer]; very active around the house and with husband  PLOF: Independent  PATIENT GOALS "get back to be able to do what I usually do"   OBJECTIVE:   DIAGNOSTIC FINDINGS: Quoted  from MD note upon hospital discharge  ''DG [place] 2-3 VIEWS LEFT  Result Date: 09/15/2021 CLINICAL DATA:  Left hip surgery 3 weeks ago EXAM: DG HIP (WITH OR WITHOUT PELVIS) 2-3V LEFT COMPARISON:  08/27/2021 FINDINGS: Postsurgical changes from left femur ORIF of intertrochanteric left femur fracture. Alignment is unchanged compared to the immediate postoperative images including mildly displaced lesser trochanteric fragment. No new fractures. Hip joint intact without dislocation. Previously seen soft tissue air has resolved. IMPRESSION: Satisfactory appearance of the left hip following recent ORIF. Electronically Signed   By: Davina Poke D.O.   On: 09/15/2021 15:46 "  PATIENT SURVEYS:  FOTO 33  COGNITION:  Overall cognitive status: Within functional limits for tasks assessed     SENSATION: WFL  EDEMA:  Circumferential: ankle circ L 24 cm verse R 23 cm with visibule edema at left foot  MUSCLE LENGTH: NT secondary to  post op  POSTURE: forward head, flexed trunk , and weight shift right  PALPATION: TTP at gross left hip into quad, left knee with light touch  LOWER EXTREMITY ROM:  Active ROM Right eval Left eval  Hip flexion    Hip extension    Hip abduction    Hip adduction    Hip internal rotation    Hip external rotation    Knee flexion WNL WNL  Knee extension WNL WNL  Ankle dorsiflexion WNL WNL  Ankle plantarflexion WNL WNL  Ankle inversion    Ankle eversion     (Blank rows = not tested)  LOWER EXTREMITY MMT: modified all testing in sitting secondary to gross trauma/post op comfort  MMT Right eval Left eval  Hip flexion 4+ 2+  Hip extension 4   Hip abduction 4+ 2+  Hip adduction 4+ 3  Hip internal rotation    Hip external rotation    Knee flexion 4+ 3  Knee extension 4+ 3  Ankle dorsiflexion 4+ 3  Ankle plantarflexion 4+ 3  Ankle inversion    Ankle eversion     (Blank rows = not tested)  LOWER EXTREMITY SPECIAL TESTS:  Hip special  tests: NA secondary to post op   FUNCTIONAL TESTS:  2 minute walk test: 90 feet with notes in below section  GAIT: Distance walked: 90 feet Assistive device utilized: Walker - 2 wheeled and platform for L UE Level of assistance: SBA Comments: Decreased stride length and decreased push off on L LE, small step through pattern limited flexion use    TODAY'S TREATMENT: 09/24/21 = Reviewed goals and discussed evaluation findings  Patient prefers sitting when asked if supine trial could be done today, thus session focused on review of HEP and seated stregnthening  There-Ex =    HEP review    - Sitting heel/ toe rocking 2 x 10 reps   - Sitting toe raises 2 x 10 reps   - Sitting heel raises 2 x 10 reps   - Sitting hip adduction pillow squeeze iso 2 x 10 reps   - Sitting hip abduction movement verse no resistance for AROM 2 x 10 reps   - Sitting hip marching B alternating x 10 reps   - Sitting LAQs alternating B x 10 reps x 2 rounds with cue for full knee extension as able   - Sitting hip abduction 2 x 10 reps verse red theraband trial  - Standing with FWW for hip swings forward/backward x 10 B   There-Act =  gait x 20 feet forward on blue line with FWW and arm  x 2 round trips   09/22/21 - Evaluation, FOTO, measurements and HEP as below  Sitting There-ex = heel toe raise x 10, hip add iso pillow x 10, hip abd AROM x 10    PATIENT EDUCATION:  Education details: 09/22/21 - evaluation findings, POC, PT scope of practice, education on edema management and DVT risk, HEP Person educated: Patient Education method: Explanation, Demonstration, and Handouts Education comprehension: verbalized understanding   HOME EXERCISE PROGRAM: Access Code: [email or web address] URL: [email or web address] Date: 09/22/2021 Prepared by: Jerilynn Som  Exercises - Seated March  - 2 x daily - 7 x weekly - 2 sets - 10 reps - Seated Isometric Hip Adduction with Ball  - 2 x daily - 7 x weekly - 2 sets - 10  reps - Seated Single Leg Hip Abduction  - 2 x daily - 7 x weekly - 2 sets -  10 reps 09/24/21 add red theraband - Seated Heel Toe Raises  - 3-5 x daily - 7 x weekly - 2 sets - 10 reps  ASSESSMENT:  CLINICAL IMPRESSION: Patient is a 76 y.o. female who was seen today for first full physical therapy treatment session for Left hip ORIF post surgical support.  Secondary to patient comfort, today's session increased strengthening activity for left hip in sitting for isolating contraction of regional muscle.  Demonstrated continued limitations of ROM as before especially difficult into hip abduction activation in AROM. Trial of increased exercises and a few standing activities to see patients ability to tolerate increase volume with fair success in clinic however some discomfort increase. Will assess full response to post tolerance as well next session.  Patient was prior independent in function and is a good candidate for skilled physical therapy to return to independence while progressing L LE strength and post operative needs.     OBJECTIVE IMPAIRMENTS Abnormal gait, decreased activity tolerance, decreased balance, decreased endurance, decreased mobility, difficulty walking, decreased strength, increased edema, impaired UE functional use, postural dysfunction, and pain.   ACTIVITY LIMITATIONS carrying, lifting, bending, standing, squatting, stairs, bathing, toileting, locomotion level, and caring for others  PARTICIPATION LIMITATIONS: meal prep, cleaning, laundry, driving, community activity, and yard work  PERSONAL FACTORS Age, Transportation, and 3+ comorbidities: DM, L UE trauma, L knee arthritis/pain chronic  are also affecting patient's functional outcome.   REHAB POTENTIAL: Good  CLINICAL DECISION MAKING: Evolving/moderate complexity  EVALUATION COMPLEXITY: Moderate   GOALS: Goals reviewed with patient? Yes  SHORT TERM GOALS: Target date: 10/20/2021  Patient will be independent with initial  HEP and self-management strategies to improve functional outcomes   Baseline: 09/22/21 - initiated today Goal status: IN PROGRESS  2.  Patient will be able to demonstrate at least 4-/5 for gross L LE strength to improve functional support on L LE and progress function Baseline: 09/22/21 - see objective measures, L hip 2+/5 for hip flexion  Goal status: IN PROGRESS  3.  Patient will be able to demonstrate ability to ambulate with equal step length and no pain in order to progress L LE functional strength and [place].  Baseline: 09/22/21 - decreased stride length left Goal status: IN PROGRESS   LONG TERM GOALS: Target date: 11/17/21   Patient will be independent with advanced HEP and self-management strategies to improve functional outcomes   Baseline: 09/22/21 - to be established Goal status: IN PROGRESS  2.  Patient will demonstrate improved functional abilities by improving FOTO by at least 15 points to 47 or higher.    Baseline: 09/22/21 - current 33 Goal status: IN PROGRESS  3.  Patient will be able to ambulate without AD independently for at least 250 feet during 2 minute walk test in order to access community distance.  Baseline: 09/22/21 - 90 feet Goal status: IN PROGRESS    PLAN: PT FREQUENCY: 2x/week  PT DURATION: 8 weeks  PLANNED INTERVENTIONS: Therapeutic exercises, Therapeutic activity, Neuromuscular re-education, Balance training, Gait training, Patient/Family education, Joint mobilization, Stair training, Spinal mobilization, Cryotherapy, Moist heat, scar mobilization, Taping, Manual therapy, and Re-evaluation  PLAN FOR NEXT SESSION: Review goals and HEP, build L hip stabilization and slow increased L LE WB strengthening   Jamse Belfast, PT 09/24/2021, 11:50 AM

## 2021-09-27 ENCOUNTER — Encounter (HOSPITAL_COMMUNITY): Payer: BC Managed Care – PPO

## 2021-09-28 ENCOUNTER — Encounter (HOSPITAL_COMMUNITY): Payer: Self-pay

## 2021-09-28 ENCOUNTER — Ambulatory Visit (HOSPITAL_COMMUNITY): Payer: Medicare Other

## 2021-09-28 ENCOUNTER — Ambulatory Visit (HOSPITAL_COMMUNITY): Payer: Medicare Other | Admitting: Physical Therapy

## 2021-09-28 DIAGNOSIS — M25652 Stiffness of left hip, not elsewhere classified: Secondary | ICD-10-CM

## 2021-09-28 DIAGNOSIS — R29898 Other symptoms and signs involving the musculoskeletal system: Secondary | ICD-10-CM | POA: Diagnosis not present

## 2021-09-28 DIAGNOSIS — R262 Difficulty in walking, not elsewhere classified: Secondary | ICD-10-CM | POA: Diagnosis not present

## 2021-09-28 DIAGNOSIS — M6281 Muscle weakness (generalized): Secondary | ICD-10-CM | POA: Diagnosis not present

## 2021-09-28 DIAGNOSIS — M25632 Stiffness of left wrist, not elsewhere classified: Secondary | ICD-10-CM

## 2021-09-28 DIAGNOSIS — M25552 Pain in left hip: Secondary | ICD-10-CM

## 2021-09-28 DIAGNOSIS — M25532 Pain in left wrist: Secondary | ICD-10-CM | POA: Diagnosis not present

## 2021-09-28 DIAGNOSIS — T1490XA Injury, unspecified, initial encounter: Secondary | ICD-10-CM | POA: Diagnosis not present

## 2021-09-28 NOTE — Patient Instructions (Signed)
Tendon Gliding Exercises: Complete 10X, hold for 3-5 seconds, 1-2x per day  1) Straight: begin with wrist in extended position and fingers straight    2) Hook: Bend your fingers making them look like a hook while keeping your thumb straight.      3) Fist: Make your hand into a fist.      4) Straight Fist: Bend your fingers straight down into a straight fist.      5) Table Top: Straighten your fingers straight out making them look like a table top.         AROM Exercises   1) Wrist Flexion  Start with wrist at edge of table, palm facing up. With wrist hanging slightly off table, curl wrist upward, and back down.      2) Wrist Extension  Start with wrist at edge of table, palm facing down. With wrist slightly off the edge of the table, curl wrist up and back down.      3) Radial Deviations  Start with forearm flat against a table, wrist hanging slightly off the edge, and palm facing the wall. Bending at the wrist only, and keeping palm facing the wall, bend wrist so fist is pointing towards the floor, back up to start position, and up towards the ceiling. Return to start.        4) WRIST PRONATION  Turn your forearm towards palm face down.  Keep your elbow bent and by the side of your  Body.      5) WRIST SUPINATION  Turn your forearm towards palm face up.  Keep your elbow bent and by the side of your  Body.      *Complete exercises ______ times each, _______ times per day*

## 2021-09-28 NOTE — Therapy (Signed)
OUTPATIENT OCCUPATIONAL THERAPY TREATMENT NOTE   Patient Name: Jennifer Porter MRN: [id number] DOB:01/13/1946, 76 y.o., female Today's Date: 09/28/2021  PCP: Dr. Celene Squibb REFERRING PROVIDER: Lauraine Rinne, PA-C (surgeon is Dr. Katha Hamming)   OT End of Session - 09/28/21 0954     Visit Number 3    Number of Visits 16    Date for OT Re-Evaluation 11/21/21   Mini-reassessment 10/20/2021   Authorization Type 1) BCBS 2) [employer] Medicare; $30 copay    Progress Note Due on Visit 10    OT Start Time 0950    OT Stop Time 1029    OT Time Calculation (min) 39 min    Activity Tolerance Patient tolerated treatment well    Behavior During Therapy WFL for tasks assessed/performed             Past Medical History:  Diagnosis Date   Allergy    seasonal   Asthma    Chronic pancreatitis ([place])    Complication of anesthesia    Diabetes mellitus without complication ([place])    GERD (gastroesophageal reflux disease)    hiatal hernia   H/O: gout    Hyperlipidemia    Hypertension    Pancreatitis    PONV (postoperative nausea and vomiting)    Past Surgical History:  Procedure Laterality Date   ABDOMINAL HYSTERECTOMY     APPENDECTOMY     Arthroscopic Left Knee Left 03/22/2003   CHOLECYSTECTOMY     in [place], 2010, without polyps   ESOPHAGOGASTRODUODENOSCOPY (EGD) WITH PROPOFOL N/A 11/08/2018   Procedure: ESOPHAGOGASTRODUODENOSCOPY (EGD) WITH PROPOFOL;  Surgeon: [name], MD;  [facility];  Service: Endoscopy;  Laterality: N/A;   EUS N/A 11/08/2018   Procedure: UPPER ENDOSCOPIC ULTRASOUND (EUS) RADIAL;  Surgeon: [name], MD;  [facility];  Service: Endoscopy;  Laterality: N/A;   INTRAMEDULLARY (IM) NAIL INTERTROCHANTERIC Left 08/27/2021   Procedure: INTRAMEDULLARY (IM) NAIL INTERTROCHANTRIC;  Surgeon: Shona Needles, MD;  Location: [facility];  Service: Orthopedics;  Laterality: Left;   ORIF WRIST FRACTURE Left 08/27/2021    Procedure: OPEN REDUCTION INTERNAL FIXATION (ORIF) WRIST FRACTURE;  Surgeon: Shona Needles, MD;  Location: [facility];  Service: Orthopedics;  Laterality: Left;   Patient Active Problem List   Diagnosis Date Noted   Pressure injury of skin 09/12/2021   Trauma 09/01/2021   Subdural hematoma (HCC)    Subarachnoid hemorrhage (H[facility])    Acute blood loss anemia    Multiple injuries due to trauma 08/26/2021   Colon cancer screening 07/29/2021   Hyperlipidemia associated with type 2 diabetes mellitus ([place]) 11/28/2020   Acute recurrent pansinusitis 07/20/2020   Urinary retention 01/15/2020   Urinary urgency 01/15/2020   Abdominal cramping 08/21/2019   Protein-calorie malnutrition ([place]) 02/18/2019   Chronic pancreatitis ([place]) 08/24/2018   Loose stools 08/24/2018   Pancreatic calcification 08/22/2018   Acute pancreatitis without infection or necrosis 08/09/2018   Sigmoid diverticulosis 08/09/2018   3-vessel CAD 08/09/2018   Aortic atherosclerosis ([place]) 08/09/2018   Myelolipoma of right adrenal gland 08/09/2018   Renal cyst, right 08/09/2018   Hormone replacement therapy 01/12/2017   Hot flashes due to menopause 01/12/2017   Hypomagnesemia 06/27/2016   Diarrhea 03/28/2016   Vitamin D deficiency 10/03/2014   Left knee pain 07/02/2014   Hypertension    Gastroesophageal reflux disease    Type 2 diabetes mellitus without complication ([place])    Allergy    Asthma  H/O: gout    Hyperlipidemia     ONSET DATE: 08/26/21  REFERRING DIAG: Trauma-s/p ORIF of left distal radius fracture  THERAPY DIAG:  Pain in left wrist  Stiffness of left wrist, not elsewhere classified  Other symptoms and signs involving the musculoskeletal system  Rationale for Evaluation and Treatment Rehabilitation  PERTINENT HISTORY: Pt is a 76 y/o female s/p left wrist ORIF on 08/27/21 after falling backwards and landing on her left side on 08/26/21. Pt also with left femur fx and SAH/SDH. Pt presents with platform walker,  pre-fabricated wrist splint, elbow pad. During evaluation, staff calling surgeon's office as no clearance for wrist mobility yet, surgeon's office stating pt clear for OT and PT on LUE and LLE, sending additional referral.   PRECAUTIONS: Other: Per surgeon's office: no protocol, NWB through left wrist. Progress as tolerated.    SUBJECTIVE: S: It is a little sore. I have been taking it out a couple times to do exercises   PAIN:  Are you having pain? Yes: NPRS scale: 2/10 Pain location: ulnar wrist and base of D1 Pain description: Discomfort, ache Aggravating factors: NA Relieving factors: tylenol, muscle relaxer    OBJECTIVE:     FUNCTIONAL OUTCOME MEASURES: Quick Dash: 75   UPPER EXTREMITY ROM                  Assessed seated, gravity eliminated with exception of wrist extension-against gravity    Active ROM Left eval  Wrist flexion 32  Wrist extension 24  Wrist ulnar deviation 12  Wrist radial deviation 18  Wrist pronation 90  Wrist supination 75  (Blank rows = not tested)     UPPER EXTREMITY MMT:                  Not tested due to pain   MMT Left eval  Elbow flexion    Elbow extension    Wrist flexion    Wrist extension    Wrist ulnar deviation    Wrist radial deviation    Wrist pronation    Wrist supination    (Blank rows = not tested)   HAND FUNCTION: Grip strength: Right: 40 lbs; Left: 0 lbs, Lateral pinch: Right: 12 lbs, Left: 0 lbs, and 3 point pinch: Right: 10 lbs, Left: 0 lbs   COORDINATION: 9 Hole Peg test: Right: 19.0" sec; Left: 39.0" sec     GOALS: Goals reviewed with patient? Yes   SHORT TERM GOALS: Target date: 10/20/2021     Pt will be provided with and educated on HEP to improve mobility of left wrist required for use during ADLs.    Goal status: INITIAL   2.  Pt will increase left wrist A/ROM to 35 degrees or greater to improve ability to perform dressing and bathing tasks using LUE as non-dominant.    Goal status: INITIAL   3.  Pt  will increase left wrist strength to 3/5 to improve ability to use LUE as assist when completing simple meal preparation tasks.    Goal status: INITIAL   4.  Pt will increase left grip strength to 15# and pinch strength to 4# to improve ability to hold and use tools for grooming such as brushes, hair dryers, etc.    Goal status: INITIAL     LONG TERM GOALS: Target date: 11/17/2021     Pt will decrease pain in LUE to 3/10 or less to improve ability to use as non-dominant during daily task completion.  Goal status: INITIAL   2.  Pt will increase left wrist A/ROM to 50 degrees or greater to improve ability to use LUE to complete laundry tasks.    Goal status: INITIAL   3.  Pt will increase left wrist strength to 4/5 or greater to improve ability to complete heavy housework tasks such as vacuuming.    Goal status: INITIAL   4.  Pt will increase left grip strength to 25# and pinch strength to 8# or greater to improve ability to maintain hold on pots and pans during meal preparation.   Goal status: INITIAL   TODAY'S TREATMENT:   09/28/21 -Wrist P/ROM: Wrist flexion/extension, pronation/supination, radial/ulnar deviation, 2x10 each movement with prolonged stretch at end range  -Wrist A/ROM: Flexion/extension 1x10 each movement with a three second hold at end range -Manual therapy: myofascial release and soft tissue mobilization through forearm flexors and extensors  -Tendon Glides: 1x10 reps   09/24/21 -Wrist P/ROM: Wrist flexion/extension, pronation/supination, radial/ulnar deviation, 2x10 each movement with prolonged stretch at end range and therapeutic breaks throughout to manage pain -Grip strength: Washcloth squeeze, 1x10   PATIENT EDUCATION: Education details: Tendon glides, wrist A/ROM Person educated: Patient Education method: Consulting civil engineer, Media planner, and Handouts Education comprehension: verbalized understanding and returned demonstration   HOME EXERCISE  PROGRAM Eval: continue CIR finger ROM, washcloth squeeze 09/28/21: Tendon glides and wrist A/ROM    ASSESSMENT:   CLINICAL IMPRESSION: A: presenting today with limited wrist flexion and extension with passive stretching and reporting a sharp pain along incision of volar forearm and pain in the dorsal hand with wrist flexion. Therapist completing manual release to forearm flexors and extensors to address fascial restrictions limiting ROM and causing increased pain. Following manual therapy, pt able to achieve increase P/ROM and progress to A/ROM with minimal pain but some discomfort. Some stiffness noted with DIP joints with tendon glides, pt requiring max verbal cues and demonstration to complete initial reps but able to complete with good form and use of handout at session conclusion. Encouraged pt to continue to move wrist throughout the day to prevent stiffness.      PLAN:   OT FREQUENCY: 2x/week  OT DURATION: 8 weeks  PLANNED INTERVENTIONS:  self care/ADL training, therapeutic exercise, therapeutic activity, manual therapy, passive range of motion, splinting, electrical stimulation, ultrasound, moist heat, cryotherapy, patient/family education, and DME and/or AE instructions  CONSULTED AND AGREED WITH PLAN OF CARE: Patient  PLAN FOR NEXT SESSION: P: Continue passive stretching, wrist A/ROM, weighted stretch, grip strength with theraputty if tolerated    Flonnie Hailstone, OTD, OTR/L [phone]  09/28/2021, 11:39 AM

## 2021-09-28 NOTE — Therapy (Signed)
OUTPATIENT PHYSICAL THERAPY LOWER EXTREMITY EVALUATION   Patient Name: ARNETHA SILVERTHORNE MRN: [id number] DOB:03-Sep-1945, 76 y.o., female Today's Date: 09/28/2021   PT End of Session - 09/28/21 1035     Visit Number 3    Number of Visits 16    Date for PT Re-Evaluation 11/23/21    Authorization Type BCBS state - ded met; 30 copay; no coins; no auth needed    PT Start Time 1035    PT Stop Time 1115    PT Time Calculation (min) 40 min    Equipment Utilized During Treatment Gait belt    Activity Tolerance Patient limited by fatigue    Behavior During Therapy WFL for tasks assessed/performed             Past Medical History:  Diagnosis Date   Allergy    seasonal   Asthma    Chronic pancreatitis ([place])    Complication of anesthesia    Diabetes mellitus without complication ([place])    GERD (gastroesophageal reflux disease)    hiatal hernia   H/O: gout    Hyperlipidemia    Hypertension    Pancreatitis    PONV (postoperative nausea and vomiting)    Past Surgical History:  Procedure Laterality Date   ABDOMINAL HYSTERECTOMY     APPENDECTOMY     Arthroscopic Left Knee Left 03/22/2003   CHOLECYSTECTOMY     in [place], 2010, without polyps   ESOPHAGOGASTRODUODENOSCOPY (EGD) WITH PROPOFOL N/A 11/08/2018   Procedure: ESOPHAGOGASTRODUODENOSCOPY (EGD) WITH PROPOFOL;  Surgeon: [name], MD;  [facility];  Service: Endoscopy;  Laterality: N/A;   EUS N/A 11/08/2018   Procedure: UPPER ENDOSCOPIC ULTRASOUND (EUS) RADIAL;  Surgeon: [name], MD;  [facility];  Service: Endoscopy;  Laterality: N/A;   INTRAMEDULLARY (IM) NAIL INTERTROCHANTERIC Left 08/27/2021   Procedure: INTRAMEDULLARY (IM) NAIL INTERTROCHANTRIC;  Surgeon: Shona Needles, MD;  Location: [facility];  Service: Orthopedics;  Laterality: Left;   ORIF WRIST FRACTURE Left 08/27/2021   Procedure: OPEN REDUCTION INTERNAL FIXATION (ORIF) WRIST FRACTURE;  Surgeon: Shona Needles, MD;  Location: [facility];  Service: Orthopedics;  Laterality: Left;   Patient Active Problem List   Diagnosis Date Noted   Pressure injury of skin 09/12/2021   Trauma 09/01/2021   Subdural hematoma (HCC)    Subarachnoid hemorrhage ([facility])    Acute blood loss anemia    Multiple injuries due to trauma 08/26/2021   Colon cancer screening 07/29/2021   Hyperlipidemia associated with type 2 diabetes mellitus ([place]) 11/28/2020   Acute recurrent pansinusitis 07/20/2020   Urinary retention 01/15/2020   Urinary urgency 01/15/2020   Abdominal cramping 08/21/2019   Protein-calorie malnutrition ([place]) 02/18/2019   Chronic pancreatitis ([place]) 08/24/2018   Loose stools 08/24/2018   Pancreatic calcification 08/22/2018   Acute pancreatitis without infection or necrosis 08/09/2018   Sigmoid diverticulosis 08/09/2018   3-vessel CAD 08/09/2018   Aortic atherosclerosis ([place]) 08/09/2018   Myelolipoma of right adrenal gland 08/09/2018   Renal cyst, right 08/09/2018   Hormone replacement therapy 01/12/2017   Hot flashes due to menopause 01/12/2017   Hypomagnesemia 06/27/2016   Diarrhea 03/28/2016   Vitamin D deficiency 10/03/2014   Left knee pain 07/02/2014   Hypertension    Gastroesophageal reflux disease    Type 2 diabetes mellitus without complication ([place])    Allergy    Asthma    H/O: gout    Hyperlipidemia  PCP: Celene Squibb, MD  REFERRING PROVIDER: Cathlyn Parsons, PA-C   and verbal from surgeon Haddix, Thomasene Lot, MD  REFERRING DIAG: Trauma   THERAPY DIAG:  Pain in left hip  Difficulty in walking, not elsewhere classified  Stiffness of left hip, not elsewhere classified  Muscle weakness (generalized)  Rationale for Evaluation and Treatment Rehabilitation  ONSET DATE: 08/26/2021 trauma, 08/27/2021 surgery L hip ORIF  SUBJECTIVE:   SUBJECTIVE STATEMENT from evaluation : Pt states that she is not having any problem walking with the platform walker but she does not feel  like she is ready to try walking with a cane.  She just got done with OT so her wrist is sore but no hip painl    PERTINENT HISTORY: L hip ORIF surgery 08/27/21 L wrist ORIF sugery 08/27/21 L knee arthritis  DM - denies N/T into feet currently, reports intermittently does get these symptoms present before trauma Hx of back fracture - no current back symptoms  Chronic pancreatitis, gallbladder removed, multiple comorbidities  PAIN:  Are you having pain? No at rest in sitting   PRECAUTIONS: Fall  WEIGHT BEARING RESTRICTIONS  WBAT L LE - (of note, NWB to L UE being treated by OT but please advise for PT treatments)  FALLS:  Has patient fallen in last 6 months? Yes. Number of falls 1 this current trauma  LIVING ENVIRONMENT: Lives with: lives with their spouse Lives in: House/apartment Stairs: Yes: External: 1 steps; modified with a ramp currently Has following equipment at home: Walker - 2 wheeled and elevated toilet commode seat over regular toilet  OCCUPATION: retired Education officer, museum; very active around the house and with husband  PLOF: Independent  PATIENT GOALS "get back to be able to do what I usually do"   OBJECTIVE:   DIAGNOSTIC FINDINGS: Quoted from MD note upon hospital discharge  ''DG [place] 2-3 VIEWS LEFT  Result Date: 09/15/2021 CLINICAL DATA:  Left hip surgery 3 weeks ago EXAM: DG HIP (WITH OR WITHOUT PELVIS) 2-3V LEFT COMPARISON:  08/27/2021 FINDINGS: Postsurgical changes from left femur ORIF of intertrochanteric left femur fracture. Alignment is unchanged compared to the immediate postoperative images including mildly displaced lesser trochanteric fragment. No new fractures. Hip joint intact without dislocation. Previously seen soft tissue air has resolved. IMPRESSION: Satisfactory appearance of the left hip following recent ORIF. Electronically Signed   By: Davina Poke D.O.   On: 09/15/2021 15:46 "  PATIENT SURVEYS:  FOTO 33  EDEMA:   Circumferential: ankle circ L 24 cm verse R 23 cm with visibule edema at left foot  POSTURE: forward head, flexed trunk , and weight shift right  PALPATION: TTP at gross left hip into quad, left knee with light touch  LOWER EXTREMITY ROM:  Active ROM Right eval Left eval  Hip flexion    Hip extension    Hip abduction    Hip adduction    Hip internal rotation    Hip external rotation    Knee flexion WNL WNL  Knee extension WNL WNL  Ankle dorsiflexion WNL WNL  Ankle plantarflexion WNL WNL  Ankle inversion    Ankle eversion     (Blank rows = not tested)  LOWER EXTREMITY MMT: modified all testing in sitting secondary to gross trauma/post op comfort  MMT Right eval Left eval  Hip flexion 4+ 2+  Hip extension 4   Hip abduction 4+ 2+  Hip adduction 4+ 3  Hip internal rotation    Hip  external rotation    Knee flexion 4+ 3  Knee extension 4+ 3  Ankle dorsiflexion 4+ 3  Ankle plantarflexion 4+ 3  Ankle inversion    Ankle eversion     (Blank rows = not tested)  LOWER EXTREMITY SPECIAL TESTS:  Hip special tests: NA secondary to post op   FUNCTIONAL TESTS:  2 minute walk test: 90 feet with notes in below section  GAIT: Distance walked: 90 feet Assistive device utilized: Walker - 2 wheeled and platform for L UE Level of assistance: SBA Comments: Decreased stride length and decreased push off on L LE, small step through pattern limited flexion use    TODAY'S TREATMENT:            09/28/21:             Standing :             Gt 226' working on equal strides             Heel raises x 10             Mini squats x 10             Marching x 10             Side step x 2 RT             Lunging x 10 B               Sitting:  sit to stand x 10 09/24/21 = Reviewed goals and discussed evaluation findings  Patient prefers sitting when asked if supine trial could be done today, thus session focused on review of HEP and seated stregnthening  There-Ex =    HEP review    -  Sitting heel/ toe rocking 2 x 10 reps   - Sitting toe raises 2 x 10 reps   - Sitting heel raises 2 x 10 reps   - Sitting hip adduction pillow squeeze iso 2 x 10 reps   - Sitting hip abduction movement verse no resistance for AROM 2 x 10 reps   - Sitting hip marching B alternating x 10 reps   - Sitting LAQs alternating B x 10 reps x 2 rounds with cue for full knee extension as able   - Sitting hip abduction 2 x 10 reps verse red theraband trial  - Standing with FWW for hip swings forward/backward x 10 B   There-Act =  gait x 20 feet forward on blue line with FWW and arm  x 2 round trips   09/22/21 - Evaluation, FOTO, measurements and HEP as below  Sitting There-ex = heel toe raise x 10, hip add iso pillow x 10, hip abd AROM x 10    PATIENT EDUCATION:  Education details: 09/22/21 - evaluation findings, POC, PT scope of practice, education on edema management and DVT risk, HEP Person educated: Patient Education method: Explanation, Demonstration, and Handouts Education comprehension: verbalized understanding   HOME EXERCISE PROGRAM: Access Code: [email or web address] URL: [email or web address] Date: 09/22/2021 Prepared by: Jerilynn Som  Exercises - Seated March  - 2 x daily - 7 x weekly - 2 sets - 10 reps - Seated Isometric Hip Adduction with Ball  - 2 x daily - 7 x weekly - 2 sets - 10 reps - Seated Single Leg Hip Abduction  - 2 x daily - 7 x weekly - 2 sets - 10 reps 09/24/21 add red theraband - Seated Heel Toe Raises  -  3-5 x daily - 7 x weekly - 2 sets - 10 reps  ASSESSMENT:  CLINICAL IMPRESSION: Pt has had no difficulty with exercises or ambulating with the hemiwalker.  Progressed pt to standing activity to assist in strengthening and confidence to get pt prepared to ambulating with a cane when able.  Pt needed multiple breaks throughout treatment.    Patient was prior independent in function and is a good candidate for skilled physical therapy to return to independence while  progressing L LE strength and post operative needs.     OBJECTIVE IMPAIRMENTS Abnormal gait, decreased activity tolerance, decreased balance, decreased endurance, decreased mobility, difficulty walking, decreased strength, increased edema, impaired UE functional use, postural dysfunction, and pain.   ACTIVITY LIMITATIONS carrying, lifting, bending, standing, squatting, stairs, bathing, toileting, locomotion level, and caring for others  PARTICIPATION LIMITATIONS: meal prep, cleaning, laundry, driving, community activity, and yard work  PERSONAL FACTORS Age, Transportation, and 3+ comorbidities: DM, L UE trauma, L knee arthritis/pain chronic  are also affecting patient's functional outcome.   REHAB POTENTIAL: Good  CLINICAL DECISION MAKING: Evolving/moderate complexity  EVALUATION COMPLEXITY: Moderate   GOALS: Goals reviewed with patient? Yes  SHORT TERM GOALS: Target date: 10/20/2021  Patient will be independent with initial HEP and self-management strategies to improve functional outcomes   Baseline: 09/22/21 - initiated today Goal status: IN PROGRESS  2.  Patient will be able to demonstrate at least 4-/5 for gross L LE strength to improve functional support on L LE and progress function Baseline: 09/22/21 - see objective measures, L hip 2+/5 for hip flexion  Goal status: IN PROGRESS  3.  Patient will be able to demonstrate ability to ambulate with equal step length and no pain in order to progress L LE functional strength and Wbing.  Baseline: 09/22/21 - decreased stride length left Goal status: IN PROGRESS   LONG TERM GOALS: Target date: 11/17/21   Patient will be independent with advanced HEP and self-management strategies to improve functional outcomes   Baseline: 09/22/21 - to be established Goal status: IN PROGRESS  2.  Patient will demonstrate improved functional abilities by improving FOTO by at least 15 points to 47 or higher.    Baseline: 09/22/21 - current 33 Goal status: IN  PROGRESS  3.  Patient will be able to ambulate without AD independently for at least 250 feet during 2 minute walk test in order to access community distance.  Baseline: 09/22/21 - 90 feet Goal status: IN PROGRESS    PLAN: PT FREQUENCY: 2x/week  PT DURATION: 8 weeks  PLANNED INTERVENTIONS: Therapeutic exercises, Therapeutic activity, Neuromuscular re-education, Balance training, Gait training, Patient/Family education, Joint mobilization, Stair training, Spinal mobilization, Cryotherapy, Moist heat, scar mobilization, Taping, Manual therapy, and Re-evaluation  PLAN FOR NEXT SESSION: Review goals and HEP, build L hip stabilization and slow increased L LE WB strengthening  Rayetta Humphrey, PT CLT [phone]  09/28/2021, 11:37 AM

## 2021-09-30 ENCOUNTER — Encounter (HOSPITAL_COMMUNITY): Payer: Self-pay

## 2021-09-30 ENCOUNTER — Ambulatory Visit (HOSPITAL_COMMUNITY): Payer: Medicare Other | Admitting: Physical Therapy

## 2021-09-30 ENCOUNTER — Ambulatory Visit (HOSPITAL_COMMUNITY): Payer: Medicare Other

## 2021-09-30 DIAGNOSIS — M25632 Stiffness of left wrist, not elsewhere classified: Secondary | ICD-10-CM | POA: Diagnosis not present

## 2021-09-30 DIAGNOSIS — M6281 Muscle weakness (generalized): Secondary | ICD-10-CM

## 2021-09-30 DIAGNOSIS — R262 Difficulty in walking, not elsewhere classified: Secondary | ICD-10-CM

## 2021-09-30 DIAGNOSIS — M25652 Stiffness of left hip, not elsewhere classified: Secondary | ICD-10-CM | POA: Diagnosis not present

## 2021-09-30 DIAGNOSIS — R29898 Other symptoms and signs involving the musculoskeletal system: Secondary | ICD-10-CM | POA: Diagnosis not present

## 2021-09-30 DIAGNOSIS — M25532 Pain in left wrist: Secondary | ICD-10-CM | POA: Diagnosis not present

## 2021-09-30 DIAGNOSIS — M25552 Pain in left hip: Secondary | ICD-10-CM

## 2021-09-30 DIAGNOSIS — T1490XA Injury, unspecified, initial encounter: Secondary | ICD-10-CM | POA: Diagnosis not present

## 2021-09-30 NOTE — Therapy (Signed)
OUTPATIENT PHYSICAL THERAPY TREATMENT   Patient Name: Jennifer Porter MRN: [id number] DOB:09-Jul-1945, 76 y.o., female Today's Date: 09/30/2021   PT End of Session - 09/30/21 1313     Visit Number 4    Number of Visits 16    Date for PT Re-Evaluation 11/23/21    Authorization Type BCBS state - ded met; 30 copay; no coins; no auth needed    PT Start Time 0932    PT Stop Time 6712    PT Time Calculation (min) 39 min    Equipment Utilized During Treatment Gait belt    Activity Tolerance Patient limited by fatigue    Behavior During Therapy WFL for tasks assessed/performed             Past Medical History:  Diagnosis Date   Allergy    seasonal   Asthma    Chronic pancreatitis ([place])    Complication of anesthesia    Diabetes mellitus without complication ([place])    GERD (gastroesophageal reflux disease)    hiatal hernia   H/O: gout    Hyperlipidemia    Hypertension    Pancreatitis    PONV (postoperative nausea and vomiting)    Past Surgical History:  Procedure Laterality Date   ABDOMINAL HYSTERECTOMY     APPENDECTOMY     Arthroscopic Left Knee Left 03/22/2003   CHOLECYSTECTOMY     in [place], 2010, without polyps   ESOPHAGOGASTRODUODENOSCOPY (EGD) WITH PROPOFOL N/A 11/08/2018   Procedure: ESOPHAGOGASTRODUODENOSCOPY (EGD) WITH PROPOFOL;  Surgeon: [name], MD;  [facility];  Service: Endoscopy;  Laterality: N/A;   EUS N/A 11/08/2018   Procedure: UPPER ENDOSCOPIC ULTRASOUND (EUS) RADIAL;  Surgeon: [name], MD;  Location: [facility];  Service: Endoscopy;  Laterality: N/A;   INTRAMEDULLARY (IM) NAIL INTERTROCHANTERIC Left 08/27/2021   Procedure: INTRAMEDULLARY (IM) NAIL INTERTROCHANTRIC;  Surgeon: Shona Needles, MD;  Location: [place];  Service: Orthopedics;  Laterality: Left;   ORIF WRIST FRACTURE Left 08/27/2021   Procedure: OPEN REDUCTION INTERNAL FIXATION (ORIF) WRIST FRACTURE;  Surgeon: Shona Needles, MD;   Location: [place];  Service: Orthopedics;  Laterality: Left;   Patient Active Problem List   Diagnosis Date Noted   Pressure injury of skin 09/12/2021   Trauma 09/01/2021   Subdural hematoma (H[facility])    Subarachnoid hemorrhage ([facility])    Acute blood loss anemia    Multiple injuries due to trauma 08/26/2021   Colon cancer screening 07/29/2021   Hyperlipidemia associated with type 2 diabetes mellitus ([place]) 11/28/2020   Acute recurrent pansinusitis 07/20/2020   Urinary retention 01/15/2020   Urinary urgency 01/15/2020   Abdominal cramping 08/21/2019   Protein-calorie malnutrition ([place]) 02/18/2019   Chronic pancreatitis ([place]) 08/24/2018   Loose stools 08/24/2018   Pancreatic calcification 08/22/2018   Acute pancreatitis without infection or necrosis 08/09/2018   Sigmoid diverticulosis 08/09/2018   3-vessel CAD 08/09/2018   Aortic atherosclerosis ([place]) 08/09/2018   Myelolipoma of right adrenal gland 08/09/2018   Renal cyst, right 08/09/2018   Hormone replacement therapy 01/12/2017   Hot flashes due to menopause 01/12/2017   Hypomagnesemia 06/27/2016   Diarrhea 03/28/2016   Vitamin D deficiency 10/03/2014   Left knee pain 07/02/2014   Hypertension    Gastroesophageal reflux disease    Type 2 diabetes mellitus without complication ([place])    Allergy    Asthma    H/O: gout    Hyperlipidemia     PCP: [name],  [name], MD  REFERRING PROVIDER: Cathlyn Parsons, PA-C   and verbal from surgeon Haddix, Thomasene Lot, MD  REFERRING DIAG: Trauma   THERAPY DIAG:  Pain in left hip  Difficulty in walking, not elsewhere classified  Stiffness of left hip, not elsewhere classified  Muscle weakness (generalized)  Rationale for Evaluation and Treatment Rehabilitation  ONSET DATE: 08/26/2021 trauma, 08/27/2021 surgery L hip ORIF  SUBJECTIVE:   SUBJECTIVE STATEMENT : Pt states that she is not having any problem walking with the platform walker but she does not feel like she is ready to try  walking with a cane. Pt reports just general soreness, tiredness but no pain.    PERTINENT HISTORY: L hip ORIF surgery 08/27/21 L wrist ORIF sugery 08/27/21 L knee arthritis  DM - denies N/T into feet currently, reports intermittently does get these symptoms present before trauma Hx of back fracture - no current back symptoms  Chronic pancreatitis, gallbladder removed, multiple comorbidities  PAIN:  Are you having pain? No at rest in sitting   PRECAUTIONS: Fall  WEIGHT BEARING RESTRICTIONS  WBAT L LE - (of note, NWB to L UE being treated by OT but please advise for PT treatments)  FALLS:  Has patient fallen in last 6 months? Yes. Number of falls 1 this current trauma  LIVING ENVIRONMENT: Lives with: lives with their spouse Lives in: House/apartment Stairs: Yes: External: 1 steps; modified with a ramp currently Has following equipment at home: Walker - 2 wheeled and elevated toilet commode seat over regular toilet  OCCUPATION: retired Education officer, [employer]; very active around the house and with husband  PLOF: Independent  PATIENT GOALS "get back to be able to do what I usually do"   OBJECTIVE:   DIAGNOSTIC FINDINGS: Quoted from MD note upon hospital discharge  ''DG [place] 2-3 VIEWS LEFT  Result Date: 09/15/2021 CLINICAL DATA:  Left hip surgery 3 weeks ago EXAM: DG HIP (WITH OR WITHOUT PELVIS) 2-3V LEFT COMPARISON:  08/27/2021 FINDINGS: Postsurgical changes from left femur ORIF of intertrochanteric left femur fracture. Alignment is unchanged compared to the immediate postoperative images including mildly displaced lesser trochanteric fragment. No new fractures. Hip joint intact without dislocation. Previously seen soft tissue air has resolved. IMPRESSION: Satisfactory appearance of the left hip following recent ORIF. Electronically Signed   By: Davina Poke D.O.   On: 09/15/2021 15:46 "  PATIENT SURVEYS:  FOTO 33  EDEMA:  Circumferential: ankle circ L 24 cm verse R  23 cm with visibule edema at left foot  POSTURE: forward head, flexed trunk , and weight shift right  PALPATION: TTP at gross left hip into quad, left knee with light touch  LOWER EXTREMITY ROM:  Active ROM Right eval Left eval  Knee flexion WNL WNL  Knee extension WNL WNL  Ankle dorsiflexion WNL WNL  Ankle plantarflexion WNL WNL   (Blank rows = not tested)  LOWER EXTREMITY MMT: modified all testing in sitting secondary to gross trauma/post op comfort  MMT Right eval Left eval  Hip flexion 4+ 2+  Hip extension 4   Hip abduction 4+ 2+  Hip adduction 4+ 3  Knee flexion 4+ 3  Knee extension 4+ 3  Ankle dorsiflexion 4+ 3  Ankle plantarflexion 4+ 3   (Blank rows = not tested)  LOWER EXTREMITY SPECIAL TESTS:  Hip special tests: NA secondary to post op   FUNCTIONAL TESTS:  2 minute walk test: 90 feet with notes in below section  GAIT: Distance walked:  90 feet Assistive device utilized: Environmental consultant - 2 wheeled and platform for L UE Level of assistance: SBA Comments: Decreased stride length and decreased push off on L LE, small step through pattern limited flexion use    TODAY'S TREATMENT:  09/30/21 Below completed with SBA, AROM, Rt UE assist Standing:  Heelraise 20X    Toeraises 20X   Squats 20X  Marching 10X alteranating  Walking with Rt UE only 2RT in bars  Sidestepping with Rt UE only 2RT in bars  Lunges alternating onto 4" box with Rt UE assist 2X10 Sitting: sit to stand from standard chair and black foam in seat, no UE 5X   09/28/21:             Standing :             Gt 226' working on equal strides             Heel raises x 10             Mini squats x 10             Marching x 10             Side step x 2 RT             Lunging x 10 B              Sitting:  sit to stand x 10  09/24/21 = Reviewed goals and discussed evaluation findings  Patient prefers sitting when asked if supine trial could be done today, thus session focused on review of HEP and seated  stregnthening  There-Ex =    HEP review    - Sitting heel/ toe rocking 2 x 10 reps   - Sitting toe raises 2 x 10 reps   - Sitting heel raises 2 x 10 reps   - Sitting hip adduction pillow squeeze iso 2 x 10 reps   - Sitting hip abduction movement verse no resistance for AROM 2 x 10 reps   - Sitting hip marching B alternating x 10 reps   - Sitting LAQs alternating B x 10 reps x 2 rounds with cue for full knee extension as able   - Sitting hip abduction 2 x 10 reps verse red theraband trial  - Standing with FWW for hip swings forward/backward x 10 B   There-Act =  gait x 20 feet forward on blue line with FWW and arm  x 2 round trips   09/22/21 - Evaluation, FOTO, measurements and HEP as below  Sitting There-ex = heel toe raise x 10, hip add iso pillow x 10, hip abd AROM x 10    PATIENT EDUCATION:  Education details: 09/22/21 - evaluation findings, POC, PT scope of practice, education on edema management and DVT risk, HEP Person educated: Patient Education method: Explanation, Demonstration, and Handouts Education comprehension: verbalized understanding   HOME EXERCISE PROGRAM: Access Code: [email or web address] URL: [email or web address] Date: 09/22/2021 Prepared by: Jerilynn Som  Exercises - Seated March  - 2 x daily - 7 x weekly - 2 sets - 10 reps - Seated Isometric Hip Adduction with Ball  - 2 x daily - 7 x weekly - 2 sets - 10 reps - Seated Single Leg Hip Abduction  - 2 x daily - 7 x weekly - 2 sets - 10 reps 09/24/21 add red theraband - Seated Heel Toe Raises  - 3-5 x daily - 7 x weekly - 2 sets - 10  reps  ASSESSMENT:  CLINICAL IMPRESSION: Pt continues to ambulate with Lt UE platform walker.  Began working on gait without walker using Rt UE only in parallel bars.  Most difficulty completing Lt hip flexion with marching activity and overall weakness of LE LE.   Increased to 2 sets of all exercises.  Began standing from standard height chair without UE assist.  Unable to do  without 2 inch foam lift placed into chair and then had to bend far forward when standing.   Pt required four seated breaks throughout treatment.    Pt will continue to benefit from skilled physical therapy to return to prior functional status.     OBJECTIVE IMPAIRMENTS Abnormal gait, decreased activity tolerance, decreased balance, decreased endurance, decreased mobility, difficulty walking, decreased strength, increased edema, impaired UE functional use, postural dysfunction, and pain.   ACTIVITY LIMITATIONS carrying, lifting, bending, standing, squatting, stairs, bathing, toileting, locomotion level, and caring for others  PARTICIPATION LIMITATIONS: meal prep, cleaning, laundry, driving, community activity, and yard work  PERSONAL FACTORS Age, Transportation, and 3+ comorbidities: DM, L UE trauma, L knee arthritis/pain chronic  are also affecting patient's functional outcome.   REHAB POTENTIAL: Good  CLINICAL DECISION MAKING: Evolving/moderate complexity  EVALUATION COMPLEXITY: Moderate   GOALS: Goals reviewed with patient? Yes  SHORT TERM GOALS: Target date: 10/20/2021  Patient will be independent with initial HEP and self-management strategies to improve functional outcomes   Baseline: 09/22/21 - initiated today Goal status: IN PROGRESS  2.  Patient will be able to demonstrate at least 4-/5 for gross L LE strength to improve functional support on L LE and progress function Baseline: 09/22/21 - see objective measures, L hip 2+/5 for hip flexion  Goal status: IN PROGRESS  3.  Patient will be able to demonstrate ability to ambulate with equal step length and no pain in order to progress L LE functional strength and Wbing.  Baseline: 09/22/21 - decreased stride length left Goal status: IN PROGRESS   LONG TERM GOALS: Target date: 11/17/21   Patient will be independent with advanced HEP and self-management strategies to improve functional outcomes   Baseline: 09/22/21 - to be  established Goal status: IN PROGRESS  2.  Patient will demonstrate improved functional abilities by improving FOTO by at least 15 points to 47 or higher.    Baseline: 09/22/21 - current 33 Goal status: IN PROGRESS  3.  Patient will be able to ambulate without AD independently for at least 250 feet during 2 minute walk test in order to access community distance.  Baseline: 09/22/21 - 90 feet Goal status: IN PROGRESS    PLAN: PT FREQUENCY: 2x/week  PT DURATION: 8 weeks  PLANNED INTERVENTIONS: Therapeutic exercises, Therapeutic activity, Neuromuscular re-education, Balance training, Gait training, Patient/Family education, Joint mobilization, Stair training, Spinal mobilization, Cryotherapy, Moist heat, scar mobilization, Taping, Manual therapy, and Re-evaluation  PLAN FOR NEXT SESSION:  Continue to progress Lt hip stabilization and slow increased L LE WB strengthening.    09/30/2021, 11:37 AM Teena Irani, PTA/CLT [place] Ph: [phone]

## 2021-09-30 NOTE — Therapy (Signed)
OUTPATIENT OCCUPATIONAL THERAPY TREATMENT NOTE   Patient Name: Jennifer Porter MRN: [id number] DOB:10-31-1945, 76 y.o., female Today's Date: 09/30/2021  PCP: Dr. Celene Squibb REFERRING PROVIDER: Lauraine Rinne, PA-C (surgeon is Dr. Katha Hamming)   OT End of Session - 09/30/21 1349     Visit Number 4    Number of Visits 16    Date for OT Re-Evaluation 11/21/21   Mini-reassessment 10/20/2021   Authorization Type 1) BCBS 2) [employer] Medicare; $30 copay    Progress Note Due on Visit 10    OT Start Time 1345    OT Stop Time 1429    OT Time Calculation (min) 44 min    Activity Tolerance Patient tolerated treatment well    Behavior During Therapy WFL for tasks assessed/performed             Past Medical History:  Diagnosis Date   Allergy    seasonal   Asthma    Chronic pancreatitis ([place])    Complication of anesthesia    Diabetes mellitus without complication ([place])    GERD (gastroesophageal reflux disease)    hiatal hernia   H/O: gout    Hyperlipidemia    Hypertension    Pancreatitis    PONV (postoperative nausea and vomiting)    Past Surgical History:  Procedure Laterality Date   ABDOMINAL HYSTERECTOMY     APPENDECTOMY     Arthroscopic Left Knee Left 03/22/2003   CHOLECYSTECTOMY     in [place], 2010, without polyps   ESOPHAGOGASTRODUODENOSCOPY (EGD) WITH PROPOFOL N/A 11/08/2018   Procedure: ESOPHAGOGASTRODUODENOSCOPY (EGD) WITH PROPOFOL;  Surgeon: [name], MD;  [facility];  Service: Endoscopy;  Laterality: N/A;   EUS N/A 11/08/2018   Procedure: UPPER ENDOSCOPIC ULTRASOUND (EUS) RADIAL;  Surgeon: [name], MD;  [facility];  Service: Endoscopy;  Laterality: N/A;   INTRAMEDULLARY (IM) NAIL INTERTROCHANTERIC Left 08/27/2021   Procedure: INTRAMEDULLARY (IM) NAIL INTERTROCHANTRIC;  Surgeon: Shona Needles, MD;  Location: [place];  Service: Orthopedics;  Laterality: Left;   ORIF WRIST FRACTURE Left 08/27/2021    Procedure: OPEN REDUCTION INTERNAL FIXATION (ORIF) WRIST FRACTURE;  Surgeon: Shona Needles, MD;  Location: [place];  Service: Orthopedics;  Laterality: Left;   Patient Active Problem List   Diagnosis Date Noted   Pressure injury of skin 09/12/2021   Trauma 09/01/2021   Subdural hematoma ([facility])    Subarachnoid hemorrhage ([facility])    Acute blood loss anemia    Multiple injuries due to trauma 08/26/2021   Colon cancer screening 07/29/2021   Hyperlipidemia associated with type 2 diabetes mellitus ([place]) 11/28/2020   Acute recurrent pansinusitis 07/20/2020   Urinary retention 01/15/2020   Urinary urgency 01/15/2020   Abdominal cramping 08/21/2019   Protein-calorie malnutrition ([place]) 02/18/2019   Chronic pancreatitis ([place]) 08/24/2018   Loose stools 08/24/2018   Pancreatic calcification 08/22/2018   Acute pancreatitis without infection or necrosis 08/09/2018   Sigmoid diverticulosis 08/09/2018   3-vessel CAD 08/09/2018   Aortic atherosclerosis ([place]) 08/09/2018   Myelolipoma of right adrenal gland 08/09/2018   Renal cyst, right 08/09/2018   Hormone replacement therapy 01/12/2017   Hot flashes due to menopause 01/12/2017   Hypomagnesemia 06/27/2016   Diarrhea 03/28/2016   Vitamin D deficiency 10/03/2014   Left knee pain 07/02/2014   Hypertension    Gastroesophageal reflux disease    Type 2 diabetes mellitus without complication ([place])    Allergy    Asthma  H/O: gout    Hyperlipidemia     ONSET DATE: 08/26/21  REFERRING DIAG: Trauma-s/p ORIF of left distal radius fracture  THERAPY DIAG:  Pain in left wrist  Stiffness of left wrist, not elsewhere classified  Other symptoms and signs involving the musculoskeletal system  Rationale for Evaluation and Treatment Rehabilitation  PERTINENT HISTORY: Pt is a 76 y/o female s/p left wrist ORIF on 08/27/21 after falling backwards and landing on her left side on 08/26/21. Pt also with left femur fx and SAH/SDH. Pt presents with platform walker,  pre-fabricated wrist splint, elbow pad. During evaluation, staff calling surgeon's office as no clearance for wrist mobility yet, surgeon's office stating pt clear for OT and PT on LUE and LLE, sending additional referral.   PRECAUTIONS: Other: Per surgeon's office: no protocol, NWB through left wrist. Progress as tolerated.    SUBJECTIVE: S: It is always sore after therapy, I am doing my exercises a couple times day   PAIN:  Are you having pain? Yes: NPRS scale: 2/10 Pain location: ulnar wrist and base of D1 Pain description: Discomfort, ache Aggravating factors: NA Relieving factors: tylenol, muscle relaxer    OBJECTIVE:     FUNCTIONAL OUTCOME MEASURES: Quick Dash: 75   UPPER EXTREMITY ROM                  Assessed seated, gravity eliminated with exception of wrist extension-against gravity    Active ROM Left eval  Wrist flexion 32  Wrist extension 24  Wrist ulnar deviation 12  Wrist radial deviation 18  Wrist pronation 90  Wrist supination 75  (Blank rows = not tested)     UPPER EXTREMITY MMT:                  Not tested due to pain   MMT Left eval  Elbow flexion    Elbow extension    Wrist flexion    Wrist extension    Wrist ulnar deviation    Wrist radial deviation    Wrist pronation    Wrist supination    (Blank rows = not tested)   HAND FUNCTION: Grip strength: Right: 40 lbs; Left: 0 lbs, Lateral pinch: Right: 12 lbs, Left: 0 lbs, and 3 point pinch: Right: 10 lbs, Left: 0 lbs   COORDINATION: 9 Hole Peg test: Right: 19.0" sec; Left: 39.0" sec     GOALS: Goals reviewed with patient? Yes   SHORT TERM GOALS: Target date: 10/20/2021     Pt will be provided with and educated on HEP to improve mobility of left wrist required for use during ADLs.    Goal status: INITIAL   2.  Pt will increase left wrist A/ROM to 35 degrees or greater to improve ability to perform dressing and bathing tasks using LUE as non-dominant.    Goal status: INITIAL   3.  Pt  will increase left wrist strength to 3/5 to improve ability to use LUE as assist when completing simple meal preparation tasks.    Goal status: INITIAL   4.  Pt will increase left grip strength to 15# and pinch strength to 4# to improve ability to hold and use tools for grooming such as brushes, hair dryers, etc.    Goal status: INITIAL     LONG TERM GOALS: Target date: 11/17/2021     Pt will decrease pain in LUE to 3/10 or less to improve ability to use as non-dominant during daily task completion.    Goal  status: INITIAL   2.  Pt will increase left wrist A/ROM to 50 degrees or greater to improve ability to use LUE to complete laundry tasks.    Goal status: INITIAL   3.  Pt will increase left wrist strength to 4/5 or greater to improve ability to complete heavy housework tasks such as vacuuming.    Goal status: INITIAL   4.  Pt will increase left grip strength to 25# and pinch strength to 8# or greater to improve ability to maintain hold on pots and pans during meal preparation.   Goal status: INITIAL   TODAY'S TREATMENT:   09/30/21 -Wrist P/ROM: Wrist flexion/extension, pronation/supination, radial/ulnar deviation, 2x10 each movement with prolonged stretch at end range  -Wrist A/ROM: Flexion/extension 1x10 each movement with a three second hold at end range -Manual therapy: myofascial release and soft tissue mobilization through forearm flexors and extensors  -Tendon Glides: 1x10 reps  -Theraputty (yellow) Grip Strength: 1x10 full fist squeeze x 5 seconds  -Theraputty Pinch Strength: 2x10 lateral pinch and pull  -Theraputty coordination: Removing 5 beads  -Thumb P/ROM: 1x10 thumb circumduction, abduction, adduction, flexion, extension -Coordination: D1/5 pinch to grasp and move cubes, 4x8   09/28/21 -Wrist P/ROM: Wrist flexion/extension, pronation/supination, radial/ulnar deviation, 2x10 each movement with prolonged stretch at end range  -Wrist A/ROM: Flexion/extension 1x10  each movement with a three second hold at end range -Manual therapy: myofascial release and soft tissue mobilization through forearm flexors and extensors  -Tendon Glides: 1x10 reps   09/24/21 -Wrist P/ROM: Wrist flexion/extension, pronation/supination, radial/ulnar deviation, 2x10 each movement with prolonged stretch at end range and therapeutic breaks throughout to manage pain -Grip strength: Washcloth squeeze, 1x10   PATIENT EDUCATION: Education details: theraputty, opposition  Person educated: Patient Education method: Consulting civil engineer, Demonstration, and Handouts Education comprehension: verbalized understanding and returned demonstration   HOME EXERCISE PROGRAM Eval: continue CIR finger ROM, washcloth squeeze 09/28/21: Tendon glides and wrist A/ROM 7/13: Theraputty, opposition     ASSESSMENT:   CLINICAL IMPRESSION: A: Pt presenting today with increased A/ROM at start of session compared to previous session. Continues to have slight pain at the ulnar aspect of the forearm with end range passive wrist flexion and extension. Continues with fascial restrictions throughout forearm flexors and extensors, therapist noting some release following manual techniques. Pt reporting thumb pain, therapist completing passive stretching in all directions. Thumb initially stiff, however, significantly more motion available as passive stretching progressed. Encouraged pt to move her thumb at home even if slight uncomfortable. Challenged grip and pinch strength today with soft theraputty (yellow), pt requiring verbal cues for slow contractions. Significant weakness noted with exercises, pt requiring rest breaks throughout. Pt unable to manipulate yellow clothespin today with three point pinch. Pt has most difficulty with D1/5 opposition, improvements noted following pinch strengthening exercises. Pt continues to experience slight pain with passive stretching, therefore did not progress to weighted stretch today.  Consider for next session.    PLAN:   OT FREQUENCY: 2x/week  OT DURATION: 8 weeks  PLANNED INTERVENTIONS:  self care/ADL training, therapeutic exercise, therapeutic activity, manual therapy, passive range of motion, splinting, electrical stimulation, ultrasound, moist heat, cryotherapy, patient/family education, and DME and/or AE instructions  CONSULTED AND AGREED WITH PLAN OF CARE: Patient  PLAN FOR NEXT SESSION: P: Continue passive stretching, wrist A/ROM, weighted stretch, grip strength as tolerated    Flonnie Hailstone, OTD, OTR/L [phone]  09/30/2021, 4:53 PM

## 2021-09-30 NOTE — Patient Instructions (Signed)
Home Exercises Program Theraputty Exercises  Do the following exercises 2-3 times a day using your affected hand.  1. Roll putty into a ball.  2. Make into a pancake.  3. Roll putty into a roll.  4. Pinch along log with first finger and thumb.   5. Make into a ball.  6. Roll it back into a log.   7. Pinch using thumb and side of first finger.  8. Roll into a ball, then flatten into a pancake.  9. Using your fingers, make putty into a mountain.  10. Roll putty back into a ball and squeeze gently for 2-3 minutes.   

## 2021-10-05 ENCOUNTER — Ambulatory Visit (HOSPITAL_COMMUNITY): Payer: Medicare Other

## 2021-10-05 ENCOUNTER — Encounter (HOSPITAL_COMMUNITY): Payer: Self-pay

## 2021-10-05 ENCOUNTER — Ambulatory Visit (INDEPENDENT_AMBULATORY_CARE_PROVIDER_SITE_OTHER): Payer: Medicare Other | Admitting: Dermatology

## 2021-10-05 DIAGNOSIS — R29898 Other symptoms and signs involving the musculoskeletal system: Secondary | ICD-10-CM

## 2021-10-05 DIAGNOSIS — M6281 Muscle weakness (generalized): Secondary | ICD-10-CM

## 2021-10-05 DIAGNOSIS — R262 Difficulty in walking, not elsewhere classified: Secondary | ICD-10-CM | POA: Diagnosis not present

## 2021-10-05 DIAGNOSIS — L578 Other skin changes due to chronic exposure to nonionizing radiation: Secondary | ICD-10-CM | POA: Diagnosis not present

## 2021-10-05 DIAGNOSIS — M25552 Pain in left hip: Secondary | ICD-10-CM | POA: Diagnosis not present

## 2021-10-05 DIAGNOSIS — M25532 Pain in left wrist: Secondary | ICD-10-CM | POA: Diagnosis not present

## 2021-10-05 DIAGNOSIS — M25652 Stiffness of left hip, not elsewhere classified: Secondary | ICD-10-CM

## 2021-10-05 DIAGNOSIS — L820 Inflamed seborrheic keratosis: Secondary | ICD-10-CM

## 2021-10-05 DIAGNOSIS — T1490XA Injury, unspecified, initial encounter: Secondary | ICD-10-CM | POA: Diagnosis not present

## 2021-10-05 DIAGNOSIS — M25632 Stiffness of left wrist, not elsewhere classified: Secondary | ICD-10-CM

## 2021-10-05 DIAGNOSIS — L821 Other seborrheic keratosis: Secondary | ICD-10-CM

## 2021-10-05 NOTE — Therapy (Signed)
OUTPATIENT PHYSICAL THERAPY TREATMENT   Patient Name: Jennifer Porter MRN: [id number] DOB:Apr 16, 1945, 76 y.o., female Today's Date: 10/05/2021   PT End of Session - 10/05/21 1438     Visit Number 5    Number of Visits 16    Date for PT Re-Evaluation 11/23/21    Authorization Type BCBS state - ded met; 30 copay; no coins; no auth needed    PT Start Time 1352    PT Stop Time 1435    PT Time Calculation (min) 43 min    Equipment Utilized During Treatment Gait belt    Activity Tolerance Patient tolerated treatment well;Patient limited by fatigue    Behavior During Therapy WFL for tasks assessed/performed              Past Medical History:  Diagnosis Date   Allergy    seasonal   Asthma    Chronic pancreatitis ([place])    Complication of anesthesia    Diabetes mellitus without complication ([place])    GERD (gastroesophageal reflux disease)    hiatal hernia   H/O: gout    Hyperlipidemia    Hypertension    Pancreatitis    PONV (postoperative nausea and vomiting)    Past Surgical History:  Procedure Laterality Date   ABDOMINAL HYSTERECTOMY     APPENDECTOMY     Arthroscopic Left Knee Left 03/22/2003   CHOLECYSTECTOMY     in [place], 2010, without polyps   ESOPHAGOGASTRODUODENOSCOPY (EGD) WITH PROPOFOL N/A 11/08/2018   Procedure: ESOPHAGOGASTRODUODENOSCOPY (EGD) WITH PROPOFOL;  Surgeon: [name], MD;  Location: [facility];  Service: Endoscopy;  Laterality: N/A;   EUS N/A 11/08/2018   Procedure: UPPER ENDOSCOPIC ULTRASOUND (EUS) RADIAL;  Surgeon: [name], MD;  Location: [facility];  Service: Endoscopy;  Laterality: N/A;   INTRAMEDULLARY (IM) NAIL INTERTROCHANTERIC Left 08/27/2021   Procedure: INTRAMEDULLARY (IM) NAIL INTERTROCHANTRIC;  Surgeon: Shona Needles, MD;  Location: [facility];  Service: Orthopedics;  Laterality: Left;   ORIF WRIST FRACTURE Left 08/27/2021   Procedure: OPEN REDUCTION INTERNAL FIXATION (ORIF) WRIST FRACTURE;   Surgeon: Shona Needles, MD;  Location: [facility];  Service: Orthopedics;  Laterality: Left;   Patient Active Problem List   Diagnosis Date Noted   Pressure injury of skin 09/12/2021   Trauma 09/01/2021   Subdural hematoma ([facility])    Subarachnoid hemorrhage ([facility])    Acute blood loss anemia    Multiple injuries due to trauma 08/26/2021   Colon cancer screening 07/29/2021   Hyperlipidemia associated with type 2 diabetes mellitus ([place]) 11/28/2020   Acute recurrent pansinusitis 07/20/2020   Urinary retention 01/15/2020   Urinary urgency 01/15/2020   Abdominal cramping 08/21/2019   Protein-calorie malnutrition ([place]) 02/18/2019   Chronic pancreatitis ([place]) 08/24/2018   Loose stools 08/24/2018   Pancreatic calcification 08/22/2018   Acute pancreatitis without infection or necrosis 08/09/2018   Sigmoid diverticulosis 08/09/2018   3-vessel CAD 08/09/2018   Aortic atherosclerosis ([place]) 08/09/2018   Myelolipoma of right adrenal gland 08/09/2018   Renal cyst, right 08/09/2018   Hormone replacement therapy 01/12/2017   Hot flashes due to menopause 01/12/2017   Hypomagnesemia 06/27/2016   Diarrhea 03/28/2016   Vitamin D deficiency 10/03/2014   Left knee pain 07/02/2014   Hypertension    Gastroesophageal reflux disease    Type 2 diabetes mellitus without complication ([place])    Allergy    Asthma    H/O: gout    Hyperlipidemia  PCP: Celene Squibb, MD  REFERRING PROVIDER: Cathlyn Parsons, PA-C   and verbal from surgeon Haddix, Thomasene Lot, MD  REFERRING DIAG: Trauma   THERAPY DIAG:  Pain in left hip  Difficulty in walking, not elsewhere classified  Stiffness of left hip, not elsewhere classified  Muscle weakness (generalized)  Rationale for Evaluation and Treatment Rehabilitation  ONSET DATE: 08/26/2021 trauma, 08/27/2021 surgery L hip ORIF  SUBJECTIVE:   SUBJECTIVE STATEMENT : Pt stated she is feeling good today.  Stated she does not feel confident to walk without RW.  No  reports of pain currently.  PERTINENT HISTORY: L hip ORIF surgery 08/27/21 L wrist ORIF sugery 08/27/21 L knee arthritis  DM - denies N/T into feet currently, reports intermittently does get these symptoms present before trauma Hx of back fracture - no current back symptoms  Chronic pancreatitis, gallbladder removed, multiple comorbidities  PAIN:  Are you having pain? No at rest in sitting   PRECAUTIONS: Fall  WEIGHT BEARING RESTRICTIONS  WBAT L LE - (of note, NWB to L UE being treated by OT but please advise for PT treatments)  FALLS:  Has patient fallen in last 6 months? Yes. Number of falls 1 this current trauma  LIVING ENVIRONMENT: Lives with: lives with their spouse Lives in: House/apartment Stairs: Yes: External: 1 steps; modified with a ramp currently Has following equipment at home: Walker - 2 wheeled and elevated toilet commode seat over regular toilet  OCCUPATION: retired Education officer, [employer]; very active around the house and with husband  PLOF: Independent  PATIENT GOALS "get back to be able to do what I usually do"   OBJECTIVE:   DIAGNOSTIC FINDINGS: Quoted from MD note upon hospital discharge  ''DG [place] 2-3 VIEWS LEFT  Result Date: 09/15/2021 CLINICAL DATA:  Left hip surgery 3 weeks ago EXAM: DG HIP (WITH OR WITHOUT PELVIS) 2-3V LEFT COMPARISON:  08/27/2021 FINDINGS: Postsurgical changes from left femur ORIF of intertrochanteric left femur fracture. Alignment is unchanged compared to the immediate postoperative images including mildly displaced lesser trochanteric fragment. No new fractures. Hip joint intact without dislocation. Previously seen soft tissue air has resolved. IMPRESSION: Satisfactory appearance of the left hip following recent ORIF. Electronically Signed   By: Davina Poke D.O.   On: 09/15/2021 15:46 "  PATIENT SURVEYS:  FOTO 33  EDEMA:  Circumferential: ankle circ L 24 cm verse R 23 cm with visibule edema at left foot  POSTURE:  forward head, flexed trunk , and weight shift right  PALPATION: TTP at gross left hip into quad, left knee with light touch  LOWER EXTREMITY ROM:  Active ROM Right eval Left eval  Knee flexion WNL WNL  Knee extension WNL WNL  Ankle dorsiflexion WNL WNL  Ankle plantarflexion WNL WNL   (Blank rows = not tested)  LOWER EXTREMITY MMT: modified all testing in sitting secondary to gross trauma/post op comfort  MMT Right eval Left eval  Hip flexion 4+ 2+  Hip extension 4   Hip abduction 4+ 2+  Hip adduction 4+ 3  Knee flexion 4+ 3  Knee extension 4+ 3  Ankle dorsiflexion 4+ 3  Ankle plantarflexion 4+ 3   (Blank rows = not tested)  LOWER EXTREMITY SPECIAL TESTS:  Hip special tests: NA secondary to post op   FUNCTIONAL TESTS:  2 minute walk test: 90 feet with notes in below section  GAIT: Distance walked: 90 feet Assistive device utilized: Walker - 2 wheeled and platform for L UE  Level of assistance: SBA Comments: Decreased stride length and decreased push off on L LE, small step through pattern limited flexion use    TODAY'S TREATMENT: 10/05/21: Gait training with RW 200 to improve stride length Rockerboard lateral 9mn Walking with cueing for Lt arm swing 3RT inside // bars, Rt UE holding on Heel/toe raises 2x 10 Toe tapping 6in step height alternating 20x Tandem stance 2x 30" Sidestep 2RT hovering no HHA Lt LE step up 4in step Squats 2x 10 Gait training with SPC, cueing for sequence, heel to toe mechanics and step thru 2RT down blue line  STS no HHA elevated height with black foam, cueing for eccentric control 10x    09/30/21 Below completed with SBA, AROM, Rt UE assist Standing:  Heelraise 20X    Toeraises 20X   Squats 20X  Marching 10X alteranating  Walking with Rt UE only 2RT in bars  Sidestepping with Rt UE only 2RT in bars  Lunges alternating onto 4" box with Rt UE assist 2X10 Sitting: sit to stand from standard chair and black foam in seat, no UE  5X   09/28/21:             Standing :             Gt 226' working on equal strides             Heel raises x 10             Mini squats x 10             Marching x 10             Side step x 2 RT             Lunging x 10 B              Sitting:  sit to stand x 10  09/24/21 = Reviewed goals and discussed evaluation findings  Patient prefers sitting when asked if supine trial could be done today, thus session focused on review of HEP and seated stregnthening  There-Ex =    HEP review    - Sitting heel/ toe rocking 2 x 10 reps   - Sitting toe raises 2 x 10 reps   - Sitting heel raises 2 x 10 reps   - Sitting hip adduction pillow squeeze iso 2 x 10 reps   - Sitting hip abduction movement verse no resistance for AROM 2 x 10 reps   - Sitting hip marching B alternating x 10 reps   - Sitting LAQs alternating B x 10 reps x 2 rounds with cue for full knee extension as able   - Sitting hip abduction 2 x 10 reps verse red theraband trial  - Standing with FWW for hip swings forward/backward x 10 B   There-Act =  gait x 20 feet forward on blue line with FWW and arm  x 2 round trips   09/22/21 - Evaluation, FOTO, measurements and HEP as below  Sitting There-ex = heel toe raise x 10, hip add iso pillow x 10, hip abd AROM x 10    PATIENT EDUCATION:  Education details: 09/22/21 - evaluation findings, POC, PT scope of practice, education on edema management and DVT risk, HEP Person educated: Patient Education method: Explanation, Demonstration, and Handouts Education comprehension: verbalized understanding   HOME EXERCISE PROGRAM: Access Code: [email or web address]: [email or web address] Date: 09/22/2021 Prepared by: PJerilynn Som Exercises - Seated March  - 2 x  daily - 7 x weekly - 2 sets - 10 reps - Seated Isometric Hip Adduction with Ball  - 2 x daily - 7 x weekly - 2 sets - 10 reps - Seated Single Leg Hip Abduction  - 2 x daily - 7 x weekly - 2 sets - 10 reps 09/24/21 add red  theraband - Seated Heel Toe Raises  - 3-5 x daily - 7 x weekly - 2 sets - 10 reps  ASSESSMENT:  CLINICAL IMPRESSION: Pt continues to ambulate with Lt UE platform walker.  Began working on gait without walker using Rt UE only in parallel bars.  Most difficulty completing Lt hip flexion with marching activity and overall weakness of LE LE.   Increased to 2 sets of all exercises.  Began standing from standard height chair without UE assist.  Unable to do without 2 inch foam lift placed into chair and then had to bend far forward when standing.   Pt required four seated breaks throughout treatment.    Pt will continue to benefit from skilled physical therapy to return to prior functional status.    Session focus on functional strengthening and balance training to improve confidence without HHA.  Pt able to complete sidestep and tandem stance with no HHA.  Added rockerboard to improve weight distrubution and step up training for quad strengthening.  Gait training with SPC, cueing for sequence, equalize stride length and heel to toe mechanics, improved mechanics at EOS.       OBJECTIVE IMPAIRMENTS Abnormal gait, decreased activity tolerance, decreased balance, decreased endurance, decreased mobility, difficulty walking, decreased strength, increased edema, impaired UE functional use, postural dysfunction, and pain.   ACTIVITY LIMITATIONS carrying, lifting, bending, standing, squatting, stairs, bathing, toileting, locomotion level, and caring for others  PARTICIPATION LIMITATIONS: meal prep, cleaning, laundry, driving, community activity, and yard work  PERSONAL FACTORS Age, Transportation, and 3+ comorbidities: DM, L UE trauma, L knee arthritis/pain chronic  are also affecting patient's functional outcome.   REHAB POTENTIAL: Good  CLINICAL DECISION MAKING: Evolving/moderate complexity  EVALUATION COMPLEXITY: Moderate   GOALS: Goals reviewed with patient? Yes  SHORT TERM GOALS: Target date:  10/20/2021  Patient will be independent with initial HEP and self-management strategies to improve functional outcomes   Baseline: 09/22/21 - initiated today Goal status: IN PROGRESS  2.  Patient will be able to demonstrate at least 4-/5 for gross L LE strength to improve functional support on L LE and progress function Baseline: 09/22/21 - see objective measures, L hip 2+/5 for hip flexion  Goal status: IN PROGRESS  3.  Patient will be able to demonstrate ability to ambulate with equal step length and no pain in order to progress L LE functional strength and Wbing.  Baseline: 09/22/21 - decreased stride length left Goal status: IN PROGRESS   LONG TERM GOALS: Target date: 11/17/21   Patient will be independent with advanced HEP and self-management strategies to improve functional outcomes   Baseline: 09/22/21 - to be established Goal status: IN PROGRESS  2.  Patient will demonstrate improved functional abilities by improving FOTO by at least 15 points to 47 or higher.    Baseline: 09/22/21 - current 33 Goal status: IN PROGRESS  3.  Patient will be able to ambulate without AD independently for at least 250 feet during 2 minute walk test in order to access community distance.  Baseline: 09/22/21 - 90 feet Goal status: IN PROGRESS    PLAN: PT FREQUENCY: 2x/week  PT DURATION: 8 weeks  PLANNED INTERVENTIONS: Therapeutic exercises, Therapeutic activity, Neuromuscular re-education, Balance training, Gait training, Patient/Family education, Joint mobilization, Stair training, Spinal mobilization, Cryotherapy, Moist heat, scar mobilization, Taping, Manual therapy, and Re-evaluation  PLAN FOR NEXT SESSION:  Continue to progress Lt hip stabilization and slow increased L LE WB strengthening.    Ihor Austin, LPTA/CLT; Delana Meyer [phone]  10/05/2021,

## 2021-10-05 NOTE — Progress Notes (Signed)
   Follow-Up Visit   Subjective  Jennifer Porter is a 76 y.o. female who presents for the following: Skin Tag (Patient recently fell and states skin tag at left shoulder / clavicle area that has darkened. ). The patient has spots, moles and lesions to be evaluated, some may be new or changing and the patient has concerns that these could be cancer.  The following portions of the chart were reviewed this encounter and updated as appropriate:  Tobacco  Allergies  Meds  Problems  Med Hx  Surg Hx  Fam Hx     Review of Systems: No other skin or systemic complaints except as noted in HPI or Assessment and Plan.  Objective  Well appearing patient in no apparent distress; mood and affect are within normal limits.  A focused examination was performed including arms, left clavicle. Relevant physical exam findings are noted in the Assessment and Plan.  Left clavicle x 1 Erythematous stuck-on, waxy papule or plaque   Assessment & Plan  Inflamed seborrheic keratosis Left clavicle x 1  Symptomatic, irritating, patient would like treated.  Destruction of lesion - Left clavicle x 1 Complexity: simple   Destruction method: cryotherapy   Informed consent: discussed and consent obtained   Timeout:  patient name, date of birth, surgical site, and procedure verified Lesion destroyed using liquid nitrogen: Yes   Region frozen until ice ball extended beyond lesion: Yes   Outcome: patient tolerated procedure well with no complications   Post-procedure details: wound care instructions given   Additional details:  Prior to procedure, discussed risks of blister formation, small wound, skin dyspigmentation, or rare scar following cryotherapy. Recommend Vaseline ointment to treated areas while healing.   Seborrheic keratosis  Actinic skin damage  Seborrheic Keratoses - Stuck-on, waxy, tan-brown papules and/or plaques  - Benign-appearing - Discussed benign etiology and prognosis. - Observe -  Call for any changes  Actinic Damage - chronic, secondary to cumulative UV radiation exposure/sun exposure over time - diffuse scaly erythematous macules with underlying dyspigmentation - Recommend daily broad spectrum sunscreen SPF 30+ to sun-exposed areas, reapply every 2 hours as needed.  - Recommend staying in the shade or wearing long sleeves, sun glasses (UVA+UVB protection) and wide brim hats (4-inch brim around the entire circumference of the hat). - Call for new or changing lesions.  Return if symptoms worsen or fail to improve. IRuthell Rummage, CMA, am acting as scribe for [name], MD. Documentation: I have reviewed the above documentation for accuracy and completeness, and I agree with the above.  [name], MD

## 2021-10-05 NOTE — Patient Instructions (Addendum)
Seborrheic Keratosis  What causes seborrheic keratoses? Seborrheic keratoses are harmless, common skin growths that first appear during adult life.  As time goes by, more growths appear.  Some people may develop a large number of them.  Seborrheic keratoses appear on both covered and uncovered body parts.  They are not caused by sunlight.  The tendency to develop seborrheic keratoses can be inherited.  They vary in color from skin-colored to gray, brown, or even black.  They can be either smooth or have a rough, warty surface.   Seborrheic keratoses are superficial and look as if they were stuck on the skin.  Under the microscope this type of keratosis looks like layers upon layers of skin.  That is why at times the top layer may seem to fall off, but the rest of the growth remains and re-grows.    Treatment Seborrheic keratoses do not need to be treated, but can easily be removed in the office.  Seborrheic keratoses often cause symptoms when they rub on clothing or jewelry.  Lesions can be in the way of shaving.  If they become inflamed, they can cause itching, soreness, or burning.  Removal of a seborrheic keratosis can be accomplished by freezing, burning, or surgery. If any spot bleeds, scabs, or grows rapidly, please return to have it checked, as these can be an indication of a skin cancer.  Cryotherapy Aftercare  Wash gently with soap and water everyday.   Apply Vaseline and Band-Aid daily until healed.    Due to recent changes in healthcare laws, you may see results of your pathology and/or laboratory studies on MyChart before the doctors have had a chance to review them. We understand that in some cases there may be results that are confusing or concerning to you. Please understand that not all results are received at the same time and often the doctors may need to interpret multiple results in order to provide you with the best plan of care or course of treatment. Therefore, we ask that you  please give us 2 business days to thoroughly review all your results before contacting the office for clarification. Should we see a critical lab result, you will be contacted sooner.   If You Need Anything After Your Visit  If you have any questions or concerns for your doctor, please call our main line at [phone] and press option 4 to reach your doctor's medical assistant. If no one answers, please leave a voicemail as directed and we will return your call as soon as possible. Messages left after 4 pm will be answered the following business day.   You may also send us a message via MyChart. We typically respond to MyChart messages within 1-2 business days.  For prescription refills, please ask your pharmacy to contact our office. Our fax number is [phone].  If you have an urgent issue when the clinic is closed that cannot wait until the next business day, you can page your doctor at the number below.    Please note that while we do our best to be available for urgent issues outside of office hours, we are not available 24/7.   If you have an urgent issue and are unable to reach us, you may choose to seek medical care at your doctor's office, retail clinic, urgent care center, or emergency room.  If you have a medical emergency, please immediately call 911 or go to the emergency department.  Pager Numbers  - Dr. Kowalski: [phone]  -   Dr. Moye: [phone]  - Dr. Stewart: [phone]  In the event of inclement weather, please call our main line at [phone] for an update on the status of any delays or closures.  Dermatology Medication Tips: Please keep the boxes that topical medications come in in order to help keep track of the instructions about where and how to use these. Pharmacies typically print the medication instructions only on the boxes and not directly on the medication tubes.   If your medication is too expensive, please contact our office at  [phone] option 4 or send us a message through MyChart.   We are unable to tell what your co-pay for medications will be in advance as this is different depending on your insurance coverage. However, we may be able to find a substitute medication at lower cost or fill out paperwork to get insurance to cover a needed medication.   If a prior authorization is required to get your medication covered by your insurance company, please allow us 1-2 business days to complete this process.  Drug prices often vary depending on where the prescription is filled and some pharmacies may offer cheaper prices.  The website www.goodrx.com contains coupons for medications through different pharmacies. The prices here do not account for what the cost may be with help from insurance (it may be cheaper with your insurance), but the website can give you the price if you did not use any insurance.  - You can print the associated coupon and take it with your prescription to the pharmacy.  - You may also stop by our office during regular business hours and pick up a GoodRx coupon card.  - If you need your prescription sent electronically to a different pharmacy, notify our office through [facility] MyChart or by phone at [phone] option 4.     Si Usted Necesita Algo Despus de Su Visita  Tambin puede enviarnos un mensaje a travs de MyChart. Por lo general respondemos a los mensajes de MyChart en el transcurso de 1 a 2 das hbiles.  Para renovar recetas, por favor pida a su farmacia que se ponga en contacto con nuestra oficina. Nuestro nmero de fax es el [phone].  Si tiene un asunto urgente cuando la clnica est cerrada y que no puede esperar hasta el siguiente da hbil, puede llamar/localizar a su doctor(a) al nmero que aparece a continuacin.   Por favor, tenga en cuenta que aunque hacemos todo lo posible para estar disponibles para asuntos urgentes fuera del horario de oficina, no estamos  disponibles las 24 horas del da, los 7 das de la semana.   Si tiene un problema urgente y no puede comunicarse con nosotros, puede optar por buscar atencin mdica  en el consultorio de su doctor(a), en una clnica privada, en un centro de atencin urgente o en una sala de emergencias.  Si tiene una emergencia mdica, por favor llame inmediatamente al 911 o vaya a la sala de emergencias.  Nmeros de bper  - Dr. Kowalski: [phone]  - Dra. Moye: [phone]  - Dra. Stewart: [phone]  En caso de inclemencias del tiempo, por favor llame a nuestra lnea principal al [phone] para una actualizacin sobre el estado de cualquier retraso o cierre.  Consejos para la medicacin en dermatologa: Por favor, guarde las cajas en las que vienen los medicamentos de uso tpico para ayudarle a seguir las instrucciones sobre dnde y cmo usarlos. Las farmacias generalmente imprimen las instrucciones del medicamento slo en las cajas y   no directamente en los tubos del medicamento.   Si su medicamento es muy caro, por favor, pngase en contacto con nuestra oficina llamando al [phone] y presione la opcin 4 o envenos un mensaje a travs de MyChart.   No podemos decirle cul ser su copago por los medicamentos por adelantado ya que esto es diferente dependiendo de la cobertura de su seguro. Sin embargo, es posible que podamos encontrar un medicamento sustituto a menor costo o llenar un formulario para que el seguro cubra el medicamento que se considera necesario.   Si se requiere una autorizacin previa para que su compaa de seguros cubra su medicamento, por favor permtanos de 1 a 2 das hbiles para completar este proceso.  Los precios de los medicamentos varan con frecuencia dependiendo del lugar de dnde se surte la receta y alguna farmacias pueden ofrecer precios ms baratos.  El sitio web www.goodrx.com tiene cupones para medicamentos de diferentes farmacias. Los precios aqu no  tienen en cuenta lo que podra costar con la ayuda del seguro (puede ser ms barato con su seguro), pero el sitio web puede darle el precio si no utiliz ningn seguro.  - Puede imprimir el cupn correspondiente y llevarlo con su receta a la farmacia.  - Tambin puede pasar por nuestra oficina durante el horario de atencin regular y recoger una tarjeta de cupones de GoodRx.  - Si necesita que su receta se enve electrnicamente a una farmacia diferente, informe a nuestra oficina a travs de MyChart de [facility] o por telfono llamando al [phone] y presione la opcin 4.  

## 2021-10-05 NOTE — Therapy (Signed)
OUTPATIENT OCCUPATIONAL THERAPY TREATMENT NOTE   Patient Name: Jennifer Porter MRN: [id number] DOB:May 23, 1945, 76 y.o., female Today's Date: 10/05/2021  PCP: Dr. Celene Squibb REFERRING PROVIDER: Lauraine Rinne, PA-C (surgeon is Dr. Katha Hamming)   OT End of Session - 10/05/21 1304     Visit Number 5    Number of Visits 16    Date for OT Re-Evaluation 11/21/21    Authorization Type 1) BCBS 2) [employer] Medicare; $30 copay    Progress Note Due on Visit 10    OT Start Time 1300    OT Stop Time 1345    OT Time Calculation (min) 45 min    Activity Tolerance Patient tolerated treatment well    Behavior During Therapy WFL for tasks assessed/performed             Past Medical History:  Diagnosis Date   Allergy    seasonal   Asthma    Chronic pancreatitis ([place])    Complication of anesthesia    Diabetes mellitus without complication ([place])    GERD (gastroesophageal reflux disease)    hiatal hernia   H/O: gout    Hyperlipidemia    Hypertension    Pancreatitis    PONV (postoperative nausea and vomiting)    Past Surgical History:  Procedure Laterality Date   ABDOMINAL HYSTERECTOMY     APPENDECTOMY     Arthroscopic Left Knee Left 03/22/2003   CHOLECYSTECTOMY     in [place], 2010, without polyps   ESOPHAGOGASTRODUODENOSCOPY (EGD) WITH PROPOFOL N/A 11/08/2018   Procedure: ESOPHAGOGASTRODUODENOSCOPY (EGD) WITH PROPOFOL;  Surgeon: [name], MD;  Location: [facility];  Service: Endoscopy;  Laterality: N/A;   EUS N/A 11/08/2018   Procedure: UPPER ENDOSCOPIC ULTRASOUND (EUS) RADIAL;  Surgeon: [name], MD;  Location: [facility];  Service: Endoscopy;  Laterality: N/A;   INTRAMEDULLARY (IM) NAIL INTERTROCHANTERIC Left 08/27/2021   Procedure: INTRAMEDULLARY (IM) NAIL INTERTROCHANTRIC;  Surgeon: Shona Needles, MD;  Location: [facility];  Service: Orthopedics;  Laterality: Left;   ORIF WRIST FRACTURE Left 08/27/2021   Procedure: OPEN REDUCTION  INTERNAL FIXATION (ORIF) WRIST FRACTURE;  Surgeon: Shona Needles, MD;  Location: [facility];  Service: Orthopedics;  Laterality: Left;   Patient Active Problem List   Diagnosis Date Noted   Pressure injury of skin 09/12/2021   Trauma 09/01/2021   Subdural hematoma ([facility])    Subarachnoid hemorrhage ([facility])    Acute blood loss anemia    Multiple injuries due to trauma 08/26/2021   Colon cancer screening 07/29/2021   Hyperlipidemia associated with type 2 diabetes mellitus ([place]) 11/28/2020   Acute recurrent pansinusitis 07/20/2020   Urinary retention 01/15/2020   Urinary urgency 01/15/2020   Abdominal cramping 08/21/2019   Protein-calorie malnutrition ([place]) 02/18/2019   Chronic pancreatitis ([place]) 08/24/2018   Loose stools 08/24/2018   Pancreatic calcification 08/22/2018   Acute pancreatitis without infection or necrosis 08/09/2018   Sigmoid diverticulosis 08/09/2018   3-vessel CAD 08/09/2018   Aortic atherosclerosis ([place]) 08/09/2018   Myelolipoma of right adrenal gland 08/09/2018   Renal cyst, right 08/09/2018   Hormone replacement therapy 01/12/2017   Hot flashes due to menopause 01/12/2017   Hypomagnesemia 06/27/2016   Diarrhea 03/28/2016   Vitamin D deficiency 10/03/2014   Left knee pain 07/02/2014   Hypertension    Gastroesophageal reflux disease    Type 2 diabetes mellitus without complication ([place])    Allergy    Asthma  H/O: gout    Hyperlipidemia     ONSET DATE: 08/26/21  REFERRING DIAG: Trauma-s/p ORIF of left distal radius fracture  THERAPY DIAG:  Other symptoms and signs involving the musculoskeletal system  Stiffness of left wrist, not elsewhere classified  Pain in left wrist  Rationale for Evaluation and Treatment Rehabilitation  PERTINENT HISTORY: Pt is a 76 y/o female s/p left wrist ORIF on 08/27/21 after falling backwards and landing on her left side on 08/26/21. Pt also with left femur fx and SAH/SDH. Pt presents with platform walker, pre-fabricated wrist  splint, elbow pad. During evaluation, staff calling surgeon's office as no clearance for wrist mobility yet, surgeon's office stating pt clear for OT and PT on LUE and LLE, sending additional referral.   PRECAUTIONS: Other: Per surgeon's office: no protocol, NWB through left wrist. Progress as tolerated.    SUBJECTIVE: S: My thumb is feeling better, I think moving it has helped a little  PAIN:  Are you having pain? Yes: NPRS scale: 2/10 Pain location: ulnar wrist and base of D1 Pain description: Discomfort, ache Aggravating factors: NA Relieving factors: tylenol, muscle relaxer    OBJECTIVE:     FUNCTIONAL OUTCOME MEASURES: Quick Dash: 75   UPPER EXTREMITY ROM                  Assessed seated, gravity eliminated with exception of wrist extension-against gravity    Active ROM Left eval  Wrist flexion 32  Wrist extension 24  Wrist ulnar deviation 12  Wrist radial deviation 18  Wrist pronation 90  Wrist supination 75  (Blank rows = not tested)     UPPER EXTREMITY MMT:                  Not tested due to pain   MMT Left eval  Elbow flexion    Elbow extension    Wrist flexion    Wrist extension    Wrist ulnar deviation    Wrist radial deviation    Wrist pronation    Wrist supination    (Blank rows = not tested)   HAND FUNCTION: Grip strength: Right: 40 lbs; Left: 0 lbs, Lateral pinch: Right: 12 lbs, Left: 0 lbs, and 3 point pinch: Right: 10 lbs, Left: 0 lbs   COORDINATION: 9 Hole Peg test: Right: 19.0" sec; Left: 39.0" sec     GOALS: Goals reviewed with patient? Yes   SHORT TERM GOALS: Target date: 10/20/2021     Pt will be provided with and educated on HEP to improve mobility of left wrist required for use during ADLs.    Goal status: INITIAL   2.  Pt will increase left wrist A/ROM to 35 degrees or greater to improve ability to perform dressing and bathing tasks using LUE as non-dominant.    Goal status: INITIAL   3.  Pt will increase left wrist strength  to 3/5 to improve ability to use LUE as assist when completing simple meal preparation tasks.    Goal status: INITIAL   4.  Pt will increase left grip strength to 15# and pinch strength to 4# to improve ability to hold and use tools for grooming such as brushes, hair dryers, etc.    Goal status: INITIAL     LONG TERM GOALS: Target date: 11/17/2021     Pt will decrease pain in LUE to 3/10 or less to improve ability to use as non-dominant during daily task completion.    Goal status: INITIAL  2.  Pt will increase left wrist A/ROM to 50 degrees or greater to improve ability to use LUE to complete laundry tasks.    Goal status: INITIAL   3.  Pt will increase left wrist strength to 4/5 or greater to improve ability to complete heavy housework tasks such as vacuuming.    Goal status: INITIAL   4.  Pt will increase left grip strength to 25# and pinch strength to 8# or greater to improve ability to maintain hold on pots and pans during meal preparation.   Goal status: INITIAL   TODAY'S TREATMENT:   10/05/21 -Manual therapy: myofascial release and soft tissue mobilization through forearm flexors and extensors to increase ROM and decrease pain.  -Wrist P/ROM: Wrist flexion/extension, pronation/supination, radial/ulnar deviation, 2x10 each movement with prolonged stretch at end range  -Wrist A/ROM: Flexion/extension 1x10 each movement with a three second hold at end range -Pinch strength: Yellow clothespin to grasp and release foam cubes 3x5 using four finger grasp  -Coordination: D1/5 opposition to retrieve small pegs 3x5 from table level -Coordination: Translation, 1x15 palm-finger-palm translation with large bead    09/30/21 -Wrist P/ROM: Wrist flexion/extension, pronation/supination, radial/ulnar deviation, 2x10 each movement with prolonged stretch at end range  -Wrist A/ROM: Flexion/extension 1x10 each movement with a three second hold at end range -Manual therapy: myofascial  release and soft tissue mobilization through forearm flexors and extensors  -Tendon Glides: 1x10 reps  -Theraputty (yellow) Grip Strength: 1x10 full fist squeeze x 5 seconds  -Theraputty Pinch Strength: 2x10 lateral pinch and pull  -Theraputty coordination: Removing 5 beads  -Thumb P/ROM: 1x10 thumb circumduction, abduction, adduction, flexion, extension -Coordination: D1/5 pinch to grasp and move cubes, 4x8   09/28/21 -Wrist P/ROM: Wrist flexion/extension, pronation/supination, radial/ulnar deviation, 2x10 each movement with prolonged stretch at end range  -Wrist A/ROM: Flexion/extension 1x10 each movement with a three second hold at end range -Manual therapy: myofascial release and soft tissue mobilization through forearm flexors and extensors  -Tendon Glides: 1x10 reps     PATIENT EDUCATION: Education details: theraputty, opposition  Person educated: Patient Education method: Consulting civil engineer, Demonstration, and Handouts Education comprehension: verbalized understanding and returned demonstration   HOME EXERCISE PROGRAM Eval: continue CIR finger ROM, washcloth squeeze 09/28/21: Tendon glides and wrist A/ROM 7/13: Theraputty, opposition     ASSESSMENT:   CLINICAL IMPRESSION: A: Pt reports that she has been completing her HEP and feels that the increased movement has decreased pain and stiffness. She continues with some fascial restrictions throughout the forearm and hand musculature limiting ROM, noted tightness and increase pain along the incision as well. Pt continues to report some pain with end range passive stretching but with each session demonstrates slightly increased passive and active range of motion. Progressed to grip and pinch strengthening today with pt requiring increased time but able to use lightest clothespin to grasp and release foam cubes with use of four finger pinch, unable to complete with three point pinch. Improved coordination and opposition today, pt requiring  increase time to pick up small items from table level and to complete translation. Therapist providing verbal cues throughout for form to increase success with activities.    PLAN:   OT FREQUENCY: 2x/week  OT DURATION: 8 weeks  PLANNED INTERVENTIONS:  self care/ADL training, therapeutic exercise, therapeutic activity, manual therapy, passive range of motion, splinting, electrical stimulation, ultrasound, moist heat, cryotherapy, patient/family education, and DME and/or AE instructions  CONSULTED AND AGREED WITH PLAN OF CARE: Patient  PLAN FOR NEXT SESSION: P: Continue  passive stretching, wrist A/ROM, weighted stretch, grip strength as tolerated    Flonnie Hailstone, OTD, OTR/L [phone]  10/05/2021, 2:45 PM

## 2021-10-07 ENCOUNTER — Ambulatory Visit (HOSPITAL_COMMUNITY): Payer: Medicare Other

## 2021-10-07 ENCOUNTER — Encounter (HOSPITAL_COMMUNITY): Payer: Self-pay

## 2021-10-07 ENCOUNTER — Ambulatory Visit (HOSPITAL_COMMUNITY): Payer: Medicare Other | Admitting: Physical Therapy

## 2021-10-07 DIAGNOSIS — R29898 Other symptoms and signs involving the musculoskeletal system: Secondary | ICD-10-CM

## 2021-10-07 DIAGNOSIS — M25652 Stiffness of left hip, not elsewhere classified: Secondary | ICD-10-CM | POA: Diagnosis not present

## 2021-10-07 DIAGNOSIS — M6281 Muscle weakness (generalized): Secondary | ICD-10-CM | POA: Diagnosis not present

## 2021-10-07 DIAGNOSIS — R262 Difficulty in walking, not elsewhere classified: Secondary | ICD-10-CM | POA: Diagnosis not present

## 2021-10-07 DIAGNOSIS — M25552 Pain in left hip: Secondary | ICD-10-CM

## 2021-10-07 DIAGNOSIS — M25632 Stiffness of left wrist, not elsewhere classified: Secondary | ICD-10-CM | POA: Diagnosis not present

## 2021-10-07 DIAGNOSIS — M25532 Pain in left wrist: Secondary | ICD-10-CM | POA: Diagnosis not present

## 2021-10-07 DIAGNOSIS — T1490XA Injury, unspecified, initial encounter: Secondary | ICD-10-CM | POA: Diagnosis not present

## 2021-10-07 NOTE — Therapy (Signed)
OUTPATIENT OCCUPATIONAL THERAPY TREATMENT NOTE   Patient Name: Jennifer Porter MRN: [id number] DOB:06-15-1945, 76 y.o., female Today's Date: 10/07/2021  PCP: Dr. Celene Squibb REFERRING PROVIDER: Lauraine Rinne, PA-C (surgeon is Dr. Katha Hamming)   OT End of Session - 10/07/21 1035     Visit Number 6    Number of Visits 16    Date for OT Re-Evaluation 11/21/21    Authorization Type 1) BCBS 2) [employer] Medicare; $30 copay    Progress Note Due on Visit 10    OT Start Time 1031    OT Stop Time 1115    OT Time Calculation (min) 44 min    Activity Tolerance Patient tolerated treatment well    Behavior During Therapy WFL for tasks assessed/performed             Past Medical History:  Diagnosis Date   Allergy    seasonal   Asthma    Chronic pancreatitis ([place])    Complication of anesthesia    Diabetes mellitus without complication ([place])    GERD (gastroesophageal reflux disease)    hiatal hernia   H/O: gout    Hyperlipidemia    Hypertension    Pancreatitis    PONV (postoperative nausea and vomiting)    Past Surgical History:  Procedure Laterality Date   ABDOMINAL HYSTERECTOMY     APPENDECTOMY     Arthroscopic Left Knee Left 03/22/2003   CHOLECYSTECTOMY     in [place], 2010, without polyps   ESOPHAGOGASTRODUODENOSCOPY (EGD) WITH PROPOFOL N/A 11/08/2018   Procedure: ESOPHAGOGASTRODUODENOSCOPY (EGD) WITH PROPOFOL;  Surgeon: [name], MD;  [facility];  Service: Endoscopy;  Laterality: N/A;   EUS N/A 11/08/2018   Procedure: UPPER ENDOSCOPIC ULTRASOUND (EUS) RADIAL;  Surgeon: [name], MD;  [facility];  Service: Endoscopy;  Laterality: N/A;   INTRAMEDULLARY (IM) NAIL INTERTROCHANTERIC Left 08/27/2021   Procedure: INTRAMEDULLARY (IM) NAIL INTERTROCHANTRIC;  Surgeon: Shona Needles, MD;  Location: [facility];  Service: Orthopedics;  Laterality: Left;   ORIF WRIST FRACTURE Left 08/27/2021   Procedure: OPEN REDUCTION  INTERNAL FIXATION (ORIF) WRIST FRACTURE;  Surgeon: Shona Needles, MD;  Location: [facility];  Service: Orthopedics;  Laterality: Left;   Patient Active Problem List   Diagnosis Date Noted   Pressure injury of skin 09/12/2021   Trauma 09/01/2021   Subdural hematoma ([facility])    Subarachnoid hemorrhage (H[facility])    Acute blood loss anemia    Multiple injuries due to trauma 08/26/2021   Colon cancer screening 07/29/2021   Hyperlipidemia associated with type 2 diabetes mellitus ([place]) 11/28/2020   Acute recurrent pansinusitis 07/20/2020   Urinary retention 01/15/2020   Urinary urgency 01/15/2020   Abdominal cramping 08/21/2019   Protein-calorie malnutrition ([place]) 02/18/2019   Chronic pancreatitis ([place]) 08/24/2018   Loose stools 08/24/2018   Pancreatic calcification 08/22/2018   Acute pancreatitis without infection or necrosis 08/09/2018   Sigmoid diverticulosis 08/09/2018   3-vessel CAD 08/09/2018   Aortic atherosclerosis ([place]) 08/09/2018   Myelolipoma of right adrenal gland 08/09/2018   Renal cyst, right 08/09/2018   Hormone replacement therapy 01/12/2017   Hot flashes due to menopause 01/12/2017   Hypomagnesemia 06/27/2016   Diarrhea 03/28/2016   Vitamin D deficiency 10/03/2014   Left knee pain 07/02/2014   Hypertension    Gastroesophageal reflux disease    Type 2 diabetes mellitus without complication ([place])    Allergy    Asthma  H/O: gout    Hyperlipidemia     ONSET DATE: 08/26/21  REFERRING DIAG: Trauma-s/p ORIF of left distal radius fracture  THERAPY DIAG:  Other symptoms and signs involving the musculoskeletal system  Stiffness of left wrist, not elsewhere classified  Pain in left wrist  Rationale for Evaluation and Treatment Rehabilitation  PERTINENT HISTORY: Pt is a 76 y/o female s/p left wrist ORIF on 08/27/21 after falling backwards and landing on her left side on 08/26/21. Pt also with left femur fx and SAH/SDH. Pt presents with platform walker, pre-fabricated wrist  splint, elbow pad. During evaluation, staff calling surgeon's office as no clearance for wrist mobility yet, surgeon's office stating pt clear for OT and PT on LUE and LLE, sending additional referral.   PRECAUTIONS: Other: Per surgeon's office: no protocol, NWB through left wrist. Progress as tolerated.    SUBJECTIVE: S: I've had some discomfort with exercises but nothing alarming. I would like to be able to use my thumb for my  Apt on the 25th.   PAIN:  Are you having pain? Yes: NPRS scale: 2/10 Pain location: ulnar wrist and base of D1 Pain description: Discomfort, ache Aggravating factors: NA Relieving factors: tylenol, muscle relaxer    OBJECTIVE:     FUNCTIONAL OUTCOME MEASURES: Quick Dash: 75   UPPER EXTREMITY ROM                  Assessed seated, gravity eliminated with exception of wrist extension-against gravity    Active ROM Left eval  Wrist flexion 32  Wrist extension 24  Wrist ulnar deviation 12  Wrist radial deviation 18  Wrist pronation 90  Wrist supination 75  (Blank rows = not tested)     UPPER EXTREMITY MMT:                  Not tested due to pain   MMT Left eval  Elbow flexion    Elbow extension    Wrist flexion    Wrist extension    Wrist ulnar deviation    Wrist radial deviation    Wrist pronation    Wrist supination    (Blank rows = not tested)   HAND FUNCTION: Grip strength: Right: 40 lbs; Left: 0 lbs, Lateral pinch: Right: 12 lbs, Left: 0 lbs, and 3 point pinch: Right: 10 lbs, Left: 0 lbs   COORDINATION: 9 Hole Peg test: Right: 19.0" sec; Left: 39.0" sec     GOALS: Goals reviewed with patient? Yes   SHORT TERM GOALS: Target date: 10/20/2021     Pt will be provided with and educated on HEP to improve mobility of left wrist required for use during ADLs.    Goal status: INITIAL   2.  Pt will increase left wrist A/ROM to 35 degrees or greater to improve ability to perform dressing and bathing tasks using LUE as non-dominant.     Goal status: INITIAL   3.  Pt will increase left wrist strength to 3/5 to improve ability to use LUE as assist when completing simple meal preparation tasks.    Goal status: INITIAL   4.  Pt will increase left grip strength to 15# and pinch strength to 4# to improve ability to hold and use tools for grooming such as brushes, hair dryers, etc.    Goal status: INITIAL     LONG TERM GOALS: Target date: 11/17/2021     Pt will decrease pain in LUE to 3/10 or less to improve ability to  use as non-dominant during daily task completion.    Goal status: INITIAL   2.  Pt will increase left wrist A/ROM to 50 degrees or greater to improve ability to use LUE to complete laundry tasks.    Goal status: INITIAL   3.  Pt will increase left wrist strength to 4/5 or greater to improve ability to complete heavy housework tasks such as vacuuming.    Goal status: INITIAL   4.  Pt will increase left grip strength to 25# and pinch strength to 8# or greater to improve ability to maintain hold on pots and pans during meal preparation.   Goal status: INITIAL   TODAY'S TREATMENT:   10/10/21 -Weighted Wrist Stretch: 3x30 seconds flexion and extension with use of a half foam roll and 1# weight -Manual therapy: soft tissue mobilization through forearm flexors and extensors to increase ROM and decrease pain.  -P/ROM: 2x3 with prolonged stretch at end range, all wrist motions -A/ROM: 1x10 wrist flexion and extension  -Pinch strength: Yellow clothespin to grasp and release foam cubes 3x5 using 3 point pinch  -Coordination: Translation, 1x15 palm-finger-palm translation with medium bead    10/05/21 -Manual therapy: myofascial release and soft tissue mobilization through forearm flexors and extensors to increase ROM and decrease pain.  -Wrist P/ROM: Wrist flexion/extension, pronation/supination, radial/ulnar deviation, 2x10 each movement with prolonged stretch at end range  -Wrist A/ROM: Flexion/extension  1x10 each movement with a three second hold at end range -Pinch strength: Yellow clothespin to grasp and release foam cubes 3x5 using four finger grasp  -Coordination: D1/5 opposition to retrieve small pegs 3x5 from table level -Coordination: Translation, 1x15 palm-finger-palm translation with large bead    09/30/21 -Wrist P/ROM: Wrist flexion/extension, pronation/supination, radial/ulnar deviation, 2x10 each movement with prolonged stretch at end range  -Wrist A/ROM: Flexion/extension 1x10 each movement with a three second hold at end range -Manual therapy: myofascial release and soft tissue mobilization through forearm flexors and extensors  -Tendon Glides: 1x10 reps  -Theraputty (yellow) Grip Strength: 1x10 full fist squeeze x 5 seconds  -Theraputty Pinch Strength: 2x10 lateral pinch and pull  -Theraputty coordination: Removing 5 beads  -Thumb P/ROM: 1x10 thumb circumduction, abduction, adduction, flexion, extension -Coordination: D1/5 pinch to grasp and move cubes, 4x8    PATIENT EDUCATION: Education details: theraputty, opposition  Person educated: Patient Education method: Explanation, Demonstration, and Handouts Education comprehension: verbalized understanding and returned demonstration   HOME EXERCISE PROGRAM Eval: continue CIR finger ROM, washcloth squeeze 09/28/21: Tendon glides and wrist A/ROM 7/13: Theraputty, opposition     ASSESSMENT:   CLINICAL IMPRESSION: A: Pt able to tolerate progression to weighted wrist flexion and extension stretch today, reporting a "pulling" through the forearm flexors, especially with incision. Pt able to progress to three point pinch with clothespin pinch strength today, requiring less time for task. Able to complete translation with medium sized bead today, with increased coordination noted. Pt continues to report some discomfort with end range passive wrist stretching. Pt encouraged to decrease wearing time of orthosis, wearing it when  walking out in the community and going to appointments. Therapist providing moderate tactile and verbal cues throughout for form and to minimize compensation     PLAN:   OT FREQUENCY: 2x/week  OT DURATION: 8 weeks  PLANNED INTERVENTIONS:  self care/ADL training, therapeutic exercise, therapeutic activity, manual therapy, passive range of motion, splinting, electrical stimulation, ultrasound, moist heat, cryotherapy, patient/family education, and DME and/or AE instructions  CONSULTED AND AGREED WITH PLAN OF CARE: Patient  PLAN FOR NEXT SESSION: P: Begin strengthening as tolerated.     [address] [place], OTR/L [phone]  10/07/2021, 11:18 AM

## 2021-10-07 NOTE — Therapy (Signed)
OUTPATIENT PHYSICAL THERAPY TREATMENT   Patient Name: Jennifer Porter MRN: [id number] DOB:02/12/1946, 76 y.o., female Today's Date: 10/07/2021   PT End of Session - 10/07/21 1228     Visit Number 6    Number of Visits 16    Date for PT Re-Evaluation 11/23/21    Authorization Type BCBS state - ded met; 30 copay; no coins; no auth needed    PT Start Time 1117    PT Stop Time 1200    PT Time Calculation (min) 43 min    Equipment Utilized During Treatment Gait belt    Activity Tolerance Patient tolerated treatment well;Patient limited by fatigue    Behavior During Therapy WFL for tasks assessed/performed               Past Medical History:  Diagnosis Date   Allergy    seasonal   Asthma    Chronic pancreatitis ([place])    Complication of anesthesia    Diabetes mellitus without complication ([place])    GERD (gastroesophageal reflux disease)    hiatal hernia   H/O: gout    Hyperlipidemia    Hypertension    Pancreatitis    PONV (postoperative nausea and vomiting)    Past Surgical History:  Procedure Laterality Date   ABDOMINAL HYSTERECTOMY     APPENDECTOMY     Arthroscopic Left Knee Left 03/22/2003   CHOLECYSTECTOMY     in [place], 2010, without polyps   ESOPHAGOGASTRODUODENOSCOPY (EGD) WITH PROPOFOL N/A 11/08/2018   Procedure: ESOPHAGOGASTRODUODENOSCOPY (EGD) WITH PROPOFOL;  Surgeon: [name], MD;  [facility];  Service: Endoscopy;  Laterality: N/A;   EUS N/A 11/08/2018   Procedure: UPPER ENDOSCOPIC ULTRASOUND (EUS) RADIAL;  Surgeon: [name], MD;  [facility];  Service: Endoscopy;  Laterality: N/A;   INTRAMEDULLARY (IM) NAIL INTERTROCHANTERIC Left 08/27/2021   Procedure: INTRAMEDULLARY (IM) NAIL INTERTROCHANTRIC;  Surgeon: Shona Needles, MD;  Location: [facility];  Service: Orthopedics;  Laterality: Left;   ORIF WRIST FRACTURE Left 08/27/2021   Procedure: OPEN REDUCTION INTERNAL FIXATION (ORIF) WRIST FRACTURE;   Surgeon: Shona Needles, MD;  Location: [facility];  Service: Orthopedics;  Laterality: Left;   Patient Active Problem List   Diagnosis Date Noted   Pressure injury of skin 09/12/2021   Trauma 09/01/2021   Subdural hematoma ([facility])    Subarachnoid hemorrhage ([facility])    Acute blood loss anemia    Multiple injuries due to trauma 08/26/2021   Colon cancer screening 07/29/2021   Hyperlipidemia associated with type 2 diabetes mellitus ([place]) 11/28/2020   Acute recurrent pansinusitis 07/20/2020   Urinary retention 01/15/2020   Urinary urgency 01/15/2020   Abdominal cramping 08/21/2019   Protein-calorie malnutrition ([place]) 02/18/2019   Chronic pancreatitis ([place]) 08/24/2018   Loose stools 08/24/2018   Pancreatic calcification 08/22/2018   Acute pancreatitis without infection or necrosis 08/09/2018   Sigmoid diverticulosis 08/09/2018   3-vessel CAD 08/09/2018   Aortic atherosclerosis ([place]) 08/09/2018   Myelolipoma of right adrenal gland 08/09/2018   Renal cyst, right 08/09/2018   Hormone replacement therapy 01/12/2017   Hot flashes due to menopause 01/12/2017   Hypomagnesemia 06/27/2016   Diarrhea 03/28/2016   Vitamin D deficiency 10/03/2014   Left knee pain 07/02/2014   Hypertension    Gastroesophageal reflux disease    Type 2 diabetes mellitus without complication ([place])    Allergy    Asthma    H/O: gout    Hyperlipidemia  PCP: Hall, John Z, MD  REFERRING PROVIDER: Angiulli, Daniel J, PA-C   and verbal from surgeon Haddix, Kevin P, MD  REFERRING DIAG: Trauma   THERAPY DIAG:  Pain in left hip  Difficulty in walking, not elsewhere classified  Stiffness of left hip, not elsewhere classified  Rationale for Evaluation and Treatment Rehabilitation  ONSET DATE: 08/26/2021 trauma, 08/27/2021 surgery L hip ORIF  SUBJECTIVE:   SUBJECTIVE STATEMENT : Pt stated she is feeling good today.  Stated she does not feel confident to walk without RW.  No reports of pain  currently.  PERTINENT HISTORY: L hip ORIF surgery 08/27/21 L wrist ORIF sugery 08/27/21 L knee arthritis  DM - denies N/T into feet currently, reports intermittently does get these symptoms present before trauma Hx of back fracture - no current back symptoms  Chronic pancreatitis, gallbladder removed, multiple comorbidities  PAIN:  Are you having pain? No at rest in sitting   PRECAUTIONS: Fall  WEIGHT BEARING RESTRICTIONS  WBAT L LE - (of note, NWB to L UE being treated by OT but please advise for PT treatments)  FALLS:  Has patient fallen in last 6 months? Yes. 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 Alignment is unchanged compared to the immediate postoperative images including mildly displaced lesser trochanteric fragment. No new fractures. Hip joint intact without dislocation. Previously seen soft tissue air has resolved. IMPRESSION: Satisfactory appearance of the left hip following recent ORIF. Electronically Signed   By: Nicholas  Plundo D.O.   On: 09/15/2021 15:46 "  PATIENT SURVEYS:  FOTO 33  EDEMA:  Circumferential: ankle circ L 24 cm verse R 23 cm with visibule edema at left foot  POSTURE: forward head,  flexed trunk , and weight shift right  PALPATION: TTP at gross left hip into quad, left knee with light touch  LOWER EXTREMITY ROM:  Active ROM Right eval Left eval  Knee flexion WNL WNL  Knee extension WNL WNL  Ankle dorsiflexion WNL WNL  Ankle plantarflexion WNL WNL   (Blank rows = not tested)  LOWER EXTREMITY MMT: modified all testing in sitting secondary to gross trauma/post op comfort  MMT Right eval Left eval  Hip flexion 4+ 2+  Hip extension 4   Hip abduction 4+ 2+  Hip adduction 4+ 3  Knee flexion 4+ 3  Knee extension 4+ 3  Ankle dorsiflexion 4+ 3  Ankle plantarflexion 4+ 3   (Blank rows = not tested)  LOWER EXTREMITY SPECIAL TESTS:  Hip special tests: NA secondary to post op   FUNCTIONAL TESTS:  2 minute walk test: 90 feet with notes in below section  GAIT: Distance walked: 90 feet Assistive device utilized: Walker - 2 wheeled and platform for L UE Level of assistance: SBA   Comments: Decreased stride length and decreased push off on L LE, small step through pattern limited flexion use    TODAY'S TREATMENT: 10/07/21: Gait training with SPC 120 feet Rockerboard lateral 11mn Heel/toe raises 20x Toe tapping 6in step height alternating 20x Forward lunges onto 6" step with UE assist 2X10 Tandem stance 2x 30" Sidestep 2RT hovering no HHA Lt LE step up 4in step 2X10 with UE assist STS no HHA elevated height with black foam, cueing for eccentric control 10x   10/05/21: Gait training with RW 200 to improve stride length Rockerboard lateral 286m Walking with cueing for Lt arm swing 3RT inside // bars, Rt UE holding on Heel/toe raises 2x 10 Toe tapping 6in step height alternating 20x Tandem stance 2x 30" Sidestep 2RT hovering no HHA Lt LE step up 4in step Squats 2x 10 Gait training with SPC, cueing for sequence, heel to toe mechanics and step thru 2RT down blue line  STS no HHA elevated height with black foam, cueing for eccentric control 10x     09/30/21 Below completed with SBA, AROM, Rt UE assist Standing:  Heelraise 20X    Toeraises 20X   Squats 20X  Marching 10X alteranating  Walking with Rt UE only 2RT in bars  Sidestepping with Rt UE only 2RT in bars  Lunges alternating onto 4" box with Rt UE assist 2X10 Sitting: sit to stand from standard chair and black foam in seat, no UE 5X   09/28/21:             Standing :             Gt 226' working on equal strides             Heel raises x 10             Mini squats x 10             Marching x 10             Side step x 2 RT             Lunging x 10 B              Sitting:  sit to stand x 10  09/24/21 = Reviewed goals and discussed evaluation findings  Patient prefers sitting when asked if supine trial could be done today, thus session focused on review of HEP and seated stregnthening  There-Ex =    HEP review    - Sitting heel/ toe rocking 2 x 10 reps   - Sitting toe raises 2 x 10 reps   - Sitting heel raises 2 x 10 reps   - Sitting hip adduction pillow squeeze iso 2 x 10 reps   - Sitting hip abduction movement verse no resistance for AROM 2 x 10 reps   - Sitting hip marching B alternating x 10 reps   - Sitting LAQs alternating B x 10 reps x 2 rounds with cue for full knee extension as able   - Sitting hip abduction 2 x 10 reps verse red theraband trial  - Standing with FWW for hip swings forward/backward x 10 B   There-Act =  gait x 20 feet forward on blue line with FWW and arm  x 2 round trips   09/22/21 - Evaluation, FOTO, measurements and HEP as below  Sitting There-ex = heel toe raise x 10, hip add iso pillow x 10, hip abd AROM x 10    PATIENT  EDUCATION:  Education details: 09/22/21 - evaluation findings, POC, PT scope of practice, education on edema management and DVT risk, HEP Person educated: Patient Education method: Explanation, Demonstration, and Handouts Education comprehension: verbalized understanding   HOME EXERCISE PROGRAM: Access Code: [email or web address] URL:  [email or web address] Date: 09/22/2021 Prepared by: Patricia Clark  Exercises - Seated March  - 2 x daily - 7 x weekly - 2 sets - 10 reps - Seated Isometric Hip Adduction with Ball  - 2 x daily - 7 x weekly - 2 sets - 10 reps - Seated Single Leg Hip Abduction  - 2 x daily - 7 x weekly - 2 sets - 10 reps 09/24/21 add red theraband - Seated Heel Toe Raises  - 3-5 x daily - 7 x weekly - 2 sets - 10 reps  ASSESSMENT:  CLINICAL IMPRESSION: Began session with ambulation using SPC.  Pt able to complete distance of 120 feet with good cadence and stability.   Attempted to increase to 6" step for forward step up, however too difficult.  Began forward lunges using 6" step and UE assist.  Pt required intermittent standing and seated rest breaks during session today.  Pt request to bring her cane into clinic next visit to insure it is the right height.  Pt will continue to benefit from skilled physical therapy to return to prior functional status.    Session focus on functional strengthening and balance training to improve confidence without HHA.  Pt able to complete sidestep and tandem stance with no HHA.  Added rockerboard to improve weight distrubution and step up training for quad strengthening.  Gait training with SPC, cueing for sequence, equalize stride length and heel to toe mechanics, improved mechanics at EOS.       OBJECTIVE IMPAIRMENTS Abnormal gait, decreased activity tolerance, decreased balance, decreased endurance, decreased mobility, difficulty walking, decreased strength, increased edema, impaired UE functional use, postural dysfunction, and pain.   ACTIVITY LIMITATIONS carrying, lifting, bending, standing, squatting, stairs, bathing, toileting, locomotion level, and caring for others  PARTICIPATION LIMITATIONS: meal prep, cleaning, laundry, driving, community activity, and yard work  PERSONAL FACTORS Age, Transportation, and 3+ comorbidities: DM, L UE trauma, L knee  arthritis/pain chronic  are also affecting patient's functional outcome.   REHAB POTENTIAL: Good  CLINICAL DECISION MAKING: Evolving/moderate complexity  EVALUATION COMPLEXITY: Moderate   GOALS: Goals reviewed with patient? Yes  SHORT TERM GOALS: Target date: 10/20/2021  Patient will be independent with initial HEP and self-management strategies to improve functional outcomes   Baseline: 09/22/21 - initiated today Goal status: IN PROGRESS  2.  Patient will be able to demonstrate at least 4-/5 for gross L LE strength to improve functional support on L LE and progress function Baseline: 09/22/21 - see objective measures, L hip 2+/5 for hip flexion  Goal status: IN PROGRESS  3.  Patient will be able to demonstrate ability to ambulate with equal step length and no pain in order to progress L LE functional strength and Wbing.  Baseline: 09/22/21 - decreased stride length left Goal status: IN PROGRESS   LONG TERM GOALS: Target date: 11/17/21   Patient will be independent with advanced HEP and self-management strategies to improve functional outcomes   Baseline: 09/22/21 - to be established Goal status: IN PROGRESS  2.  Patient will demonstrate improved functional abilities by improving FOTO by at least 15 points to 47 or higher.    Baseline: 09/22/21 - current 33 Goal status: IN PROGRESS  3.  Patient will be   able to ambulate without AD independently for at least 250 feet during 2 minute walk test in order to access community distance.  Baseline: 09/22/21 - 90 feet Goal status: IN PROGRESS    PLAN: PT FREQUENCY: 2x/week  PT DURATION: 8 weeks  PLANNED INTERVENTIONS: Therapeutic exercises, Therapeutic activity, Neuromuscular re-education, Balance training, Gait training, Patient/Family education, Joint mobilization, Stair training, Spinal mobilization, Cryotherapy, Moist heat, scar mobilization, Taping, Manual therapy, and Re-evaluation  PLAN FOR NEXT SESSION:  Continue to progress Lt hip  stabilization and slow increased L LE WB strengthening.    Teena Irani, PTA/CLT [place] Ph: [phone]  Teena Irani, PTA 10/07/2021, 12:29 PM

## 2021-10-11 ENCOUNTER — Ambulatory Visit (HOSPITAL_COMMUNITY): Payer: Medicare Other

## 2021-10-11 ENCOUNTER — Encounter (HOSPITAL_COMMUNITY): Payer: Self-pay

## 2021-10-11 DIAGNOSIS — M25652 Stiffness of left hip, not elsewhere classified: Secondary | ICD-10-CM | POA: Diagnosis not present

## 2021-10-11 DIAGNOSIS — R262 Difficulty in walking, not elsewhere classified: Secondary | ICD-10-CM | POA: Diagnosis not present

## 2021-10-11 DIAGNOSIS — M25532 Pain in left wrist: Secondary | ICD-10-CM

## 2021-10-11 DIAGNOSIS — M25632 Stiffness of left wrist, not elsewhere classified: Secondary | ICD-10-CM | POA: Diagnosis not present

## 2021-10-11 DIAGNOSIS — T1490XA Injury, unspecified, initial encounter: Secondary | ICD-10-CM | POA: Diagnosis not present

## 2021-10-11 DIAGNOSIS — M25552 Pain in left hip: Secondary | ICD-10-CM | POA: Diagnosis not present

## 2021-10-11 DIAGNOSIS — R29898 Other symptoms and signs involving the musculoskeletal system: Secondary | ICD-10-CM | POA: Diagnosis not present

## 2021-10-11 DIAGNOSIS — M6281 Muscle weakness (generalized): Secondary | ICD-10-CM | POA: Diagnosis not present

## 2021-10-11 NOTE — Therapy (Signed)
OUTPATIENT OCCUPATIONAL THERAPY TREATMENT NOTE   Patient Name: Jennifer Porter MRN: [id number] DOB:[birth year], 76 y.o., female Today's Date: 10/11/2021  PCP: Dr. Celene Squibb REFERRING PROVIDER: Lauraine Rinne, PA-C (surgeon is Dr. Katha Hamming)   OT End of Session - 10/11/21 1305     Visit Number 7    Number of Visits 16    Date for OT Re-Evaluation 11/21/21    Authorization Type 1) BCBS 2) [employer] Medicare; $30 copay    Progress Note Due on Visit 10    OT Start Time 1302    OT Stop Time 1345    OT Time Calculation (min) 43 min    Activity Tolerance Patient tolerated treatment well    Behavior During Therapy WFL for tasks assessed/performed             Past Medical History:  Diagnosis Date   Allergy    seasonal   Asthma    Chronic pancreatitis ([place])    Complication of anesthesia    Diabetes mellitus without complication ([place])    GERD (gastroesophageal reflux disease)    hiatal hernia   H/O: gout    Hyperlipidemia    Hypertension    Pancreatitis    PONV (postoperative nausea and vomiting)    Past Surgical History:  Procedure Laterality Date   ABDOMINAL HYSTERECTOMY     APPENDECTOMY     Arthroscopic Left Knee Left 03/22/2003   CHOLECYSTECTOMY     in [place], 2010, without polyps   ESOPHAGOGASTRODUODENOSCOPY (EGD) WITH PROPOFOL N/A 11/08/2018   Procedure: ESOPHAGOGASTRODUODENOSCOPY (EGD) WITH PROPOFOL;  Surgeon: [name], MD;  Location: [facility];  Service: Endoscopy;  Laterality: N/A;   EUS N/A 11/08/2018   Procedure: UPPER ENDOSCOPIC ULTRASOUND (EUS) RADIAL;  Surgeon: [name], MD;  Location: [facility];  Service: Endoscopy;  Laterality: N/A;   INTRAMEDULLARY (IM) NAIL INTERTROCHANTERIC Left 08/27/2021   Procedure: INTRAMEDULLARY (IM) NAIL INTERTROCHANTRIC;  Surgeon: Shona Needles, MD;  Location: [facility];  Service: Orthopedics;  Laterality: Left;   ORIF WRIST FRACTURE Left 08/27/2021   Procedure: OPEN REDUCTION  INTERNAL FIXATION (ORIF) WRIST FRACTURE;  Surgeon: Shona Needles, MD;  Location: [facility];  Service: Orthopedics;  Laterality: Left;   Patient Active Problem List   Diagnosis Date Noted   Pressure injury of skin 09/12/2021   Trauma 09/01/2021   Subdural hematoma (H[facility])    Subarachnoid hemorrhage (H[facility])    Acute blood loss anemia    Multiple injuries due to trauma 08/26/2021   Colon cancer screening 07/29/2021   Hyperlipidemia associated with type 2 diabetes mellitus ([place]) 11/28/2020   Acute recurrent pansinusitis 07/20/2020   Urinary retention 01/15/2020   Urinary urgency 01/15/2020   Abdominal cramping 08/21/2019   Protein-calorie malnutrition ([place]) 02/18/2019   Chronic pancreatitis ([place]) 08/24/2018   Loose stools 08/24/2018   Pancreatic calcification 08/22/2018   Acute pancreatitis without infection or necrosis 08/09/2018   Sigmoid diverticulosis 08/09/2018   3-vessel CAD 08/09/2018   Aortic atherosclerosis ([place]) 08/09/2018   Myelolipoma of right adrenal gland 08/09/2018   Renal cyst, right 08/09/2018   Hormone replacement therapy 01/12/2017   Hot flashes due to menopause 01/12/2017   Hypomagnesemia 06/27/2016   Diarrhea 03/28/2016   Vitamin D deficiency 10/03/2014   Left knee pain 07/02/2014   Hypertension    Gastroesophageal reflux disease    Type 2 diabetes mellitus without complication ([place])    Allergy    Asthma  H/O: gout    Hyperlipidemia     ONSET DATE: 08/26/21  REFERRING DIAG: Trauma-s/p ORIF of left distal radius fracture  THERAPY DIAG:  Pain in left wrist  Stiffness of left wrist, not elsewhere classified  Other symptoms and signs involving the musculoskeletal system  Rationale for Evaluation and Treatment Rehabilitation  PERTINENT HISTORY: Pt is a 76 y/o female s/p left wrist ORIF on 08/27/21 after falling backwards and landing on her left side on 08/26/21. Pt also with left femur fx and SAH/SDH. Pt presents with platform walker, pre-fabricated wrist  splint, elbow pad. During evaluation, staff calling surgeon's office as no clearance for wrist mobility yet, surgeon's office stating pt clear for OT and PT on LUE and LLE, sending additional referral.   PRECAUTIONS: Other: Per surgeon's office: no protocol, NWB through left wrist. Progress as tolerated.    SUBJECTIVE: S: "I've been sleeping without the brace and wearing it less. I have been able to open containers better with my thumb." Apt on the 25th.   PAIN:  Are you having pain? Yes: NPRS scale: 2/10 Pain location: ulnar wrist and base of D1 Pain description: Discomfort, ache Aggravating factors: NA Relieving factors: tylenol, muscle relaxer    OBJECTIVE:     FUNCTIONAL OUTCOME MEASURES: Quick Dash: 75   UPPER EXTREMITY ROM                  Assessed seated, gravity eliminated with exception of wrist extension-against gravity    Active ROM Left eval  Wrist flexion 32  Wrist extension 24  Wrist ulnar deviation 12  Wrist radial deviation 18  Wrist pronation 90  Wrist supination 75  (Blank rows = not tested)     UPPER EXTREMITY MMT:                  Not tested due to pain   MMT Left eval  Elbow flexion    Elbow extension    Wrist flexion    Wrist extension    Wrist ulnar deviation    Wrist radial deviation    Wrist pronation    Wrist supination    (Blank rows = not tested)   HAND FUNCTION: Grip strength: Right: 40 lbs; Left: 0 lbs, Lateral pinch: Right: 12 lbs, Left: 0 lbs, and 3 point pinch: Right: 10 lbs, Left: 0 lbs   COORDINATION: 9 Hole Peg test: Right: 19.0" sec; Left: 39.0" sec     GOALS: Goals reviewed with patient? Yes   SHORT TERM GOALS: Target date: 10/20/2021     Pt will be provided with and educated on HEP to improve mobility of left wrist required for use during ADLs.    Goal status: INITIAL   2.  Pt will increase left wrist A/ROM to 35 degrees or greater to improve ability to perform dressing and bathing tasks using LUE as non-dominant.     Goal status: INITIAL   3.  Pt will increase left wrist strength to 3/5 to improve ability to use LUE as assist when completing simple meal preparation tasks.    Goal status: INITIAL   4.  Pt will increase left grip strength to 15# and pinch strength to 4# to improve ability to hold and use tools for grooming such as brushes, hair dryers, etc.    Goal status: INITIAL     LONG TERM GOALS: Target date: 11/17/2021     Pt will decrease pain in LUE to 3/10 or less to improve ability to use  as non-dominant during daily task completion.    Goal status: INITIAL   2.  Pt will increase left wrist A/ROM to 50 degrees or greater to improve ability to use LUE to complete laundry tasks.    Goal status: INITIAL   3.  Pt will increase left wrist strength to 4/5 or greater to improve ability to complete heavy housework tasks such as vacuuming.    Goal status: INITIAL   4.  Pt will increase left grip strength to 25# and pinch strength to 8# or greater to improve ability to maintain hold on pots and pans during meal preparation.   Goal status: INITIAL   TODAY'S TREATMENT:   10/11/21 -Weighted Wrist Stretch: 3x30 seconds flexion and extension with use of a half foam roll and 1# weight -Manual therapy: soft tissue mobilization through forearm flexors and extensors to increase ROM and decrease pain.  -P/ROM: 2x3 with prolonged stretch at end range, all wrist motions -A/ROM: 1x10 wrist flexion and extension  -Wrist strengthening: 1x10 flexion and extension with wrist in neutral resting off of table, 1x10 wrist flexion and extension with wrist in pronation/supination resting off of table  -Velcro Board Wrist strengthening: 6 repetitions pronation supination with large roller  -Pinch strength: three point pinch to place and retrieve yellow and red clothespins (13)  -Grip Strength: 1x10 7# hand gripper in neutral, pronation, and supination. Therapist providing support to assist in positioning hand  gripper in pronation and supination.  -Coordination: Translation, 1x15 palm-finger-palm translation with small bead  -Finger isolation: Flicking foam cube 3 reps each digit.   10/10/21 -Weighted Wrist Stretch: 3x30 seconds flexion and extension with use of a half foam roll and 1# weight -Manual therapy: soft tissue mobilization through forearm flexors and extensors to increase ROM and decrease pain.  -P/ROM: 2x3 with prolonged stretch at end range, all wrist motions -A/ROM: 1x10 wrist flexion and extension  -Pinch strength: Yellow clothespin to grasp and release foam cubes 3x5 using 3 point pinch  -Coordination: Translation, 1x15 palm-finger-palm translation with medium bead    10/05/21 -Manual therapy: myofascial release and soft tissue mobilization through forearm flexors and extensors to increase ROM and decrease pain.  -Wrist P/ROM: Wrist flexion/extension, pronation/supination, radial/ulnar deviation, 2x10 each movement with prolonged stretch at end range  -Wrist A/ROM: Flexion/extension 1x10 each movement with a three second hold at end range -Pinch strength: Yellow clothespin to grasp and release foam cubes 3x5 using four finger grasp  -Coordination: D1/5 opposition to retrieve small pegs 3x5 from table level -Coordination: Translation, 1x15 palm-finger-palm translation with large bead    PATIENT EDUCATION: Education details: theraputty, opposition  Person educated: Patient Education method: Explanation, Demonstration, and Handouts Education comprehension: verbalized understanding and returned demonstration   HOME EXERCISE PROGRAM Eval: continue CIR finger ROM, washcloth squeeze 09/28/21: Tendon glides and wrist A/ROM 7/13: Theraputty, opposition     ASSESSMENT:   CLINICAL IMPRESSION: A: Pt reports that she has been wearing her brace less, completing her exercises, and that her wrist is getting stronger. Able to progress to light wrist strengthening today with pt reporting a  moderate pulling through the flexors and some discomfort. Able to complete pinch strength with red clothespin today with less difficulty and complete translation with small bead, improving from previous session. She continues to report a "pulling" along incision and some discomfort with exercises, although is able to tolerate.    PLAN:   OT FREQUENCY: 2x/week  OT DURATION: 8 weeks  PLANNED INTERVENTIONS:  self care/ADL training, therapeutic  exercise, therapeutic activity, manual therapy, passive range of motion, splinting, electrical stimulation, ultrasound, moist heat, cryotherapy, patient/family education, and DME and/or AE instructions  CONSULTED AND AGREED WITH PLAN OF CARE: Patient  PLAN FOR NEXT SESSION: P: Progress strengthening as tolerated.     Flonnie Hailstone, [place], OTR/L [phone]  10/11/2021, 3:27 PM

## 2021-10-12 DIAGNOSIS — S52502D Unspecified fracture of the lower end of left radius, subsequent encounter for closed fracture with routine healing: Secondary | ICD-10-CM | POA: Diagnosis not present

## 2021-10-12 DIAGNOSIS — S72142D Displaced intertrochanteric fracture of left femur, subsequent encounter for closed fracture with routine healing: Secondary | ICD-10-CM | POA: Diagnosis not present

## 2021-10-13 ENCOUNTER — Encounter (HOSPITAL_COMMUNITY): Payer: Self-pay | Admitting: Physical Therapy

## 2021-10-13 ENCOUNTER — Ambulatory Visit (HOSPITAL_COMMUNITY): Payer: Medicare Other

## 2021-10-13 ENCOUNTER — Ambulatory Visit (HOSPITAL_COMMUNITY): Payer: Medicare Other | Admitting: Physical Therapy

## 2021-10-13 ENCOUNTER — Encounter (HOSPITAL_COMMUNITY): Payer: Self-pay

## 2021-10-13 DIAGNOSIS — R262 Difficulty in walking, not elsewhere classified: Secondary | ICD-10-CM | POA: Diagnosis not present

## 2021-10-13 DIAGNOSIS — M25532 Pain in left wrist: Secondary | ICD-10-CM | POA: Diagnosis not present

## 2021-10-13 DIAGNOSIS — M25552 Pain in left hip: Secondary | ICD-10-CM | POA: Diagnosis not present

## 2021-10-13 DIAGNOSIS — M25632 Stiffness of left wrist, not elsewhere classified: Secondary | ICD-10-CM | POA: Diagnosis not present

## 2021-10-13 DIAGNOSIS — M25652 Stiffness of left hip, not elsewhere classified: Secondary | ICD-10-CM

## 2021-10-13 DIAGNOSIS — R29898 Other symptoms and signs involving the musculoskeletal system: Secondary | ICD-10-CM

## 2021-10-13 DIAGNOSIS — T1490XA Injury, unspecified, initial encounter: Secondary | ICD-10-CM | POA: Diagnosis not present

## 2021-10-13 DIAGNOSIS — M6281 Muscle weakness (generalized): Secondary | ICD-10-CM | POA: Diagnosis not present

## 2021-10-13 NOTE — Therapy (Signed)
OUTPATIENT PHYSICAL THERAPY TREATMENT   Patient Name: Jennifer Porter MRN: [id number] DOB:22-Jun-1945, 76 y.o., female Today's Date: 10/13/2021   PT End of Session - 10/13/21 1328     Visit Number 7    Number of Visits 16    Date for PT Re-Evaluation 11/23/21    Authorization Type BCBS state - ded met; 30 copay; no coins; no auth needed    PT Start Time 1340    PT Stop Time 1420    PT Time Calculation (min) 40 min    Equipment Utilized During Treatment Gait belt    Activity Tolerance Patient tolerated treatment well;Patient limited by fatigue    Behavior During Therapy WFL for tasks assessed/performed               Past Medical History:  Diagnosis Date   Allergy    seasonal   Asthma    Chronic pancreatitis ([facility])    Complication of anesthesia    Diabetes mellitus without complication ([facility])    GERD (gastroesophageal reflux disease)    hiatal hernia   H/O: gout    Hyperlipidemia    Hypertension    Pancreatitis    PONV (postoperative nausea and vomiting)    Past Surgical History:  Procedure Laterality Date   ABDOMINAL HYSTERECTOMY     APPENDECTOMY     Arthroscopic Left Knee Left 03/22/2003   CHOLECYSTECTOMY     in 1990s   COLONOSCOPY     , 2010, without polyps   ESOPHAGOGASTRODUODENOSCOPY (EGD) WITH PROPOFOL N/A 11/08/2018   Procedure: ESOPHAGOGASTRODUODENOSCOPY (EGD) WITH PROPOFOL;  Surgeon: Rachael Fee, MD;  [facility];  Service: Endoscopy;  Laterality: N/A;   EUS N/A 11/08/2018   Procedure: UPPER ENDOSCOPIC ULTRASOUND (EUS) RADIAL;  Surgeon: Rachael Fee, MD;  [facility];  Service: Endoscopy;  Laterality: N/A;   INTRAMEDULLARY (IM) NAIL INTERTROCHANTERIC Left 08/27/2021   Procedure: INTRAMEDULLARY (IM) NAIL INTERTROCHANTRIC;  Surgeon: Roby Lofts, MD;  [facility];  Service: Orthopedics;  Laterality: Left;   ORIF WRIST FRACTURE Left 08/27/2021   Procedure: OPEN REDUCTION INTERNAL FIXATION (ORIF) WRIST FRACTURE;   Surgeon: Roby Lofts, MD;  Location[facility];  Service: Orthopedics;  Laterality: Left;   Patient Active Problem List   Diagnosis Date Noted   Pressure injury of skin 09/12/2021   Trauma 09/01/2021   Subdural hematoma ([facility])    Subarachnoid hemorrhage ([facility])    Acute blood loss anemia    Multiple injuries due to trauma 08/26/2021   Colon cancer screening 07/29/2021   Hyperlipidemia associated with type 2 diabetes mellitus ([facility]) 11/28/2020   Acute recurrent pansinusitis 07/20/2020   Urinary retention 01/15/2020   Urinary urgency 01/15/2020   Abdominal cramping 08/21/2019   Protein-calorie malnutrition ([facility]) 02/18/2019   Chronic pancreatitis ([facility]) 08/24/2018   Loose stools 08/24/2018   Pancreatic calcification 08/22/2018   Acute pancreatitis without infection or necrosis 08/09/2018   Sigmoid diverticulosis 08/09/2018   3-vessel CAD 08/09/2018   Aortic atherosclerosis ([facility]) 08/09/2018   Myelolipoma of right adrenal gland 08/09/2018   Renal cyst, right 08/09/2018   Hormone replacement therapy 01/12/2017   Hot flashes due to menopause 01/12/2017   Hypomagnesemia 06/27/2016   Diarrhea 03/28/2016   Vitamin D deficiency 10/03/2014   Left knee pain 07/02/2014   Hypertension    Gastroesophageal reflux disease    Type 2 diabetes mellitus without complication ([facility])    Allergy    Asthma    H/O: gout    Hyperlipidemia  PCP: Benita Stabile, MD  REFERRING PROVIDER: Charlton Amor, PA-C   and verbal from surgeon Haddix, Gillie Manners, MD  REFERRING DIAG: Trauma   THERAPY DIAG:  Pain in left hip  Difficulty in walking, not elsewhere classified  Stiffness of left hip, not elsewhere classified  Muscle weakness (generalized)  Rationale for Evaluation and Treatment Rehabilitation  ONSET DATE: 08/26/2021 trauma, 08/27/2021 surgery L hip ORIF  SUBJECTIVE:   SUBJECTIVE STATEMENT : Pt states she got her brace off wrist yesterday. Surgeon pleased with her progress and things are  healing well.   PERTINENT HISTORY: L hip ORIF surgery 08/27/21 L wrist ORIF sugery 08/27/21 L knee arthritis  DM - denies N/T into feet currently, reports intermittently does get these symptoms present before trauma Hx of back fracture - no current back symptoms  Chronic pancreatitis, gallbladder removed, multiple comorbidities  PAIN:  Are you having pain? No at rest in sitting   PRECAUTIONS: Fall  WEIGHT BEARING RESTRICTIONS  WBAT L LE - (of note, NWB to L UE being treated by OT but please advise for PT treatments)  FALLS:  Has patient fallen in last 6 months? Yes. 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 Alignment is unchanged compared to the immediate postoperative images including mildly displaced lesser trochanteric fragment. No new fractures. Hip joint intact without dislocation. Previously seen soft tissue air has resolved. IMPRESSION: Satisfactory appearance of the left hip following recent ORIF. Electronically Signed   By: Duanne Guess D.O.   On: 09/15/2021 15:46 "  PATIENT SURVEYS:  FOTO 33  EDEMA:  Circumferential: ankle circ L 24 cm verse R 23 cm with visibule edema at left foot  POSTURE: forward head,  flexed trunk , and weight shift right  PALPATION: TTP at gross left hip into quad, left knee with light touch  LOWER EXTREMITY ROM:  Active ROM Right eval Left eval  Knee flexion WNL WNL  Knee extension WNL WNL  Ankle dorsiflexion WNL WNL  Ankle plantarflexion WNL WNL   (Blank rows = not tested)  LOWER EXTREMITY MMT: modified all testing in sitting secondary to gross trauma/post op comfort  MMT Right eval Left eval  Hip flexion 4+ 2+  Hip extension 4   Hip abduction 4+ 2+  Hip adduction 4+ 3  Knee flexion 4+ 3  Knee extension 4+ 3  Ankle dorsiflexion 4+ 3  Ankle plantarflexion 4+ 3   (Blank rows = not tested)  LOWER EXTREMITY SPECIAL TESTS:  Hip special tests: NA secondary to post op   FUNCTIONAL TESTS:  2 minute walk test: 90 feet with notes in below section  GAIT: Distance walked: 90 feet Assistive device utilized: Walker - 2 wheeled and platform for L UE Level of assistance: SBA  Comments: Decreased stride length and decreased push off on L LE, small step through pattern limited flexion use    TODAY'S TREATMENT: 10/13/21 HR/TR x 20  Alternating march 2x 10 Step up 4 inch 2x 10 bilateral  Standing hip abduction 2# 2x 10 bilateral Gait  with SPC 226 feet STS with black foam 1x 10    10/07/21: Gait training with SPC 120 feet Rockerboard lateral Heel/toe raises 20x Toe tapping 6in step height alternating 20x Forward lunges onto 6" step with UE assist 2X10 Tandem stance 2x 30" Sidestep 2RT hovering no HHA Lt LE step up 4in step 2X10 with UE assist STS no HHA elevated height with black foam, cueing for eccentric control 10x   10/05/21: Gait training with RW 200 to improve stride length Rockerboard lateral Walking with cueing for Lt arm swing 3RT inside // bars, Rt UE holding on Heel/toe raises 2x 10 Toe tapping 6in step height alternating 20x Tandem stance 2x 30" Sidestep 2RT hovering no HHA Lt LE step up 4in step Squats 2x 10 Gait  training with SPC, cueing for sequence, heel to toe mechanics and step thru 2RT down blue line  STS no HHA elevated height with black foam, cueing for eccentric control 10x    09/30/21 Below completed with SBA, AROM, Rt UE assist Standing:  Heelraise 20X    Toeraises 20X   Squats 20X  Marching 10X alteranating  Walking with Rt UE only 2RT in bars  Sidestepping with Rt UE only 2RT in bars  Lunges alternating onto 4" box with Rt UE assist 2X10 Sitting: sit to stand from standard chair and black foam in seat, no UE 5X   PATIENT EDUCATION:  Education details: 09/22/21 - evaluation findings, POC, PT scope of practice, education on edema management and DVT risk, HEP Person educated: Patient Education method: Explanation, Demonstration, and Handouts Education comprehension: verbalized understanding   HOME EXERCISE PROGRAM: Access Code: [email or web address] URL: [email or web address]  10/13/21- Sit to Stand with Arms Crossed  - 1 x daily - 7 x weekly - 2 sets - 10 reps  Date: 09/22/2021 - Seated March  - 2 x daily - 7 x weekly - 2 sets - 10 reps - Seated Isometric Hip Adduction with Ball  - 2 x daily - 7 x weekly - 2 sets - 10 reps - Seated Single Leg Hip Abduction  - 2 x daily - 7 x weekly - 2 sets - 10 reps 09/24/21 add red theraband - Seated Heel Toe Raises  - 3-5 x daily - 7 x weekly - 2 sets - 10 reps  ASSESSMENT:  CLINICAL IMPRESSION: Patient completes most of session with standing exercises. Patient given intermittent cueing for glute activation with good carry over.She requires UE support for most exercises for balance and strength deficits. She tends to weight shift with step ups rather than extend through LLE but mechanics improve with cueing. Patient educated on proper mechanics with SPC with good carry over.Patient will continue to benefit from physical therapy in order to improve function and reduce impairment.     OBJECTIVE IMPAIRMENTS Abnormal gait, decreased activity  tolerance, decreased balance, decreased endurance, decreased mobility, difficulty walking, decreased strength, increased edema, impaired UE functional use, postural dysfunction, and pain.   ACTIVITY LIMITATIONS carrying, lifting, bending, standing, squatting, stairs, bathing, toileting, locomotion level, and caring for others  PARTICIPATION LIMITATIONS: meal prep, cleaning, laundry, driving, community activity, and yard work  PERSONAL FACTORS Age, Transportation, and 3+ comorbidities: DM, L UE trauma,  L knee arthritis/pain chronic  are also affecting patient's functional outcome.   REHAB POTENTIAL: Good  CLINICAL DECISION MAKING: Evolving/moderate complexity  EVALUATION COMPLEXITY: Moderate   GOALS: Goals reviewed with patient? Yes  SHORT TERM GOALS: Target date: 10/20/2021  Patient will be independent with initial HEP and self-management strategies to improve functional outcomes   Baseline: 09/22/21 - initiated today Goal status: IN PROGRESS  2.  Patient will be able to demonstrate at least 4-/5 for gross L LE strength to improve functional support on L LE and progress function Baseline: 09/22/21 - see objective measures, L hip 2+/5 for hip flexion  Goal status: IN PROGRESS  3.  Patient will be able to demonstrate ability to ambulate with equal step length and no pain in order to progress L LE functional strength and Wbing.  Baseline: 09/22/21 - decreased stride length left Goal status: IN PROGRESS   LONG TERM GOALS: Target date: 11/17/21   Patient will be independent with advanced HEP and self-management strategies to improve functional outcomes   Baseline: 09/22/21 - to be established Goal status: IN PROGRESS  2.  Patient will demonstrate improved functional abilities by improving FOTO by at least 15 points to 47 or higher.    Baseline: 09/22/21 - current 33 Goal status: IN PROGRESS  3.  Patient will be able to ambulate without AD independently for at least 250 feet during 2 minute  walk test in order to access community distance.  Baseline: 09/22/21 - 90 feet Goal status: IN PROGRESS    PLAN: PT FREQUENCY: 2x/week  PT DURATION: 8 weeks  PLANNED INTERVENTIONS: Therapeutic exercises, Therapeutic activity, Neuromuscular re-education, Balance training, Gait training, Patient/Family education, Joint mobilization, Stair training, Spinal mobilization, Cryotherapy, Moist heat, scar mobilization, Taping, Manual therapy, and Re-evaluation  PLAN FOR NEXT SESSION:  Continue to progress Lt hip stabilization and slow increased L LE WB strengthening.      Reola Mosher Devri Kreher, PT 10/13/2021, 1:29 PM

## 2021-10-13 NOTE — Therapy (Signed)
OUTPATIENT OCCUPATIONAL THERAPY TREATMENT NOTE   Patient Name: Jennifer Porter MRN: [id number] DOB:[birth year], 76 y.o., female Today's Date: 10/13/2021  PCP: Dr. Celene Squibb REFERRING PROVIDER: Lauraine Rinne, PA-C (surgeon is Dr. Katha Hamming)   OT End of Session - 10/13/21 1305     Visit Number 8    Number of Visits 16    Date for OT Re-Evaluation 11/21/21    Authorization Type 1) BCBS 2) U[employer] Medicare; $30 copay    Progress Note Due on Visit 10    OT Start Time 1300    OT Stop Time 1343    OT Time Calculation (min) 43 min    Activity Tolerance Patient tolerated treatment well    Behavior During Therapy WFL for tasks assessed/performed             Past Medical History:  Diagnosis Date   Allergy    seasonal   Asthma    Chronic pancreatitis ([place])    Complication of anesthesia    Diabetes mellitus without complication ([place])    GERD (gastroesophageal reflux disease)    hiatal hernia   H/O: gout    Hyperlipidemia    Hypertension    Pancreatitis    PONV (postoperative nausea and vomiting)    Past Surgical History:  Procedure Laterality Date   ABDOMINAL HYSTERECTOMY     APPENDECTOMY     Arthroscopic Left Knee Left 03/22/2003   CHOLECYSTECTOMY     in [place], 2010, without polyps   ESOPHAGOGASTRODUODENOSCOPY (EGD) WITH PROPOFOL N/A 11/08/2018   Procedure: ESOPHAGOGASTRODUODENOSCOPY (EGD) WITH PROPOFOL;  Surgeon: [name], MD;  [facility]: [facility];  Service: Endoscopy;  Laterality: N/A;   EUS N/A 11/08/2018   Procedure: UPPER ENDOSCOPIC ULTRASOUND (EUS) RADIAL;  Surgeon: [name], MD;  Location: [facility];  Service: Endoscopy;  Laterality: N/A;   INTRAMEDULLARY (IM) NAIL INTERTROCHANTERIC Left 08/27/2021   Procedure: INTRAMEDULLARY (IM) NAIL INTERTROCHANTRIC;  Surgeon: Shona Needles, MD;  Location: [facility];  Service: Orthopedics;  Laterality: Left;   ORIF WRIST FRACTURE Left 08/27/2021   Procedure: OPEN REDUCTION  INTERNAL FIXATION (ORIF) WRIST FRACTURE;  Surgeon: Shona Needles, MD;  Location: [facility];  Service: Orthopedics;  Laterality: Left;   Patient Active Problem List   Diagnosis Date Noted   Pressure injury of skin 09/12/2021   Trauma 09/01/2021   Subdural hematoma ([facility])    Subarachnoid hemorrhage ([facility])    Acute blood loss anemia    Multiple injuries due to trauma 08/26/2021   Colon cancer screening 07/29/2021   Hyperlipidemia associated with type 2 diabetes mellitus ([place]) 11/28/2020   Acute recurrent pansinusitis 07/20/2020   Urinary retention 01/15/2020   Urinary urgency 01/15/2020   Abdominal cramping 08/21/2019   Protein-calorie malnutrition ([place]) 02/18/2019   Chronic pancreatitis ([place]) 08/24/2018   Loose stools 08/24/2018   Pancreatic calcification 08/22/2018   Acute pancreatitis without infection or necrosis 08/09/2018   Sigmoid diverticulosis 08/09/2018   3-vessel CAD 08/09/2018   Aortic atherosclerosis ([place]) 08/09/2018   Myelolipoma of right adrenal gland 08/09/2018   Renal cyst, right 08/09/2018   Hormone replacement therapy 01/12/2017   Hot flashes due to menopause 01/12/2017   Hypomagnesemia 06/27/2016   Diarrhea 03/28/2016   Vitamin D deficiency 10/03/2014   Left knee pain 07/02/2014   Hypertension    Gastroesophageal reflux disease    Type 2 diabetes mellitus without complication ([place])    Allergy    Asthma  H/O: gout    Hyperlipidemia     ONSET DATE: 08/26/21  REFERRING DIAG: Trauma-s/p ORIF of left distal radius fracture  THERAPY DIAG:  Pain in left wrist  Stiffness of left wrist, not elsewhere classified  Other symptoms and signs involving the musculoskeletal system  Rationale for Evaluation and Treatment Rehabilitation  PERTINENT HISTORY: Pt is a 76 y/o female s/p left wrist ORIF on 08/27/21 after falling backwards and landing on her left side on 08/26/21. Pt also with left femur fx and SAH/SDH. Pt presents with platform walker, pre-fabricated wrist  splint, elbow pad. During evaluation, staff calling surgeon's office as no clearance for wrist mobility yet, surgeon's office stating pt clear for OT and PT on LUE and LLE, sending additional referral.   PRECAUTIONS: Other: Per surgeon's office: no protocol, NWB through left wrist. Progress as tolerated.    SUBJECTIVE: S: "I'm done with the brace, walking without it. I've been using it to do things around the house." Apt on the 25th.   PAIN:  Are you having pain? Yes: NPRS scale: 2/10 Pain location: ulnar wrist and base of D1 Pain description: Discomfort, ache Aggravating factors: NA Relieving factors: tylenol, muscle relaxer    OBJECTIVE:     FUNCTIONAL OUTCOME MEASURES: Quick Dash: 75   UPPER EXTREMITY ROM                  Assessed seated, gravity eliminated with exception of wrist extension-against gravity    Active ROM Left eval  Wrist flexion 32  Wrist extension 24  Wrist ulnar deviation 12  Wrist radial deviation 18  Wrist pronation 90  Wrist supination 75  (Blank rows = not tested)     UPPER EXTREMITY MMT:                  Not tested due to pain   MMT Left eval  Elbow flexion    Elbow extension    Wrist flexion    Wrist extension    Wrist ulnar deviation    Wrist radial deviation    Wrist pronation    Wrist supination    (Blank rows = not tested)   HAND FUNCTION: Grip strength: Right: 40 lbs; Left: 0 lbs, Lateral pinch: Right: 12 lbs, Left: 0 lbs, and 3 point pinch: Right: 10 lbs, Left: 0 lbs   COORDINATION: 9 Hole Peg test: Right: 19.0" sec; Left: 39.0" sec     GOALS: Goals reviewed with patient? Yes   SHORT TERM GOALS: Target date: 10/20/2021     Pt will be provided with and educated on HEP to improve mobility of left wrist required for use during ADLs.    Goal status: INITIAL   2.  Pt will increase left wrist A/ROM to 35 degrees or greater to improve ability to perform dressing and bathing tasks using LUE as non-dominant.    Goal status:  INITIAL   3.  Pt will increase left wrist strength to 3/5 to improve ability to use LUE as assist when completing simple meal preparation tasks.    Goal status: INITIAL   4.  Pt will increase left grip strength to 15# and pinch strength to 4# to improve ability to hold and use tools for grooming such as brushes, hair dryers, etc.    Goal status: INITIAL     LONG TERM GOALS: Target date: 11/17/2021     Pt will decrease pain in LUE to 3/10 or less to improve ability to use as non-dominant during  daily task completion.    Goal status: INITIAL   2.  Pt will increase left wrist A/ROM to 50 degrees or greater to improve ability to use LUE to complete laundry tasks.    Goal status: INITIAL   3.  Pt will increase left wrist strength to 4/5 or greater to improve ability to complete heavy housework tasks such as vacuuming.    Goal status: INITIAL   4.  Pt will increase left grip strength to 25# and pinch strength to 8# or greater to improve ability to maintain hold on pots and pans during meal preparation.   Goal status: INITIAL   TODAY'S TREATMENT:   10/11/21 -Weighted Wrist Stretch: 2x30 seconds flexion and extension with use of a half foam roll and 2# weight -Manual therapy: soft tissue mobilization through forearm flexors and extensors to increase ROM and decrease pain.  -P/ROM: 2x3 with prolonged stretch at end range, all wrist motions -A/ROM: 1x10 wrist flexion and extension  -Wrist strengthening: 1# 1x10 flexion and extension with wrist in neutral resting off of table, 1x10 wrist flexion and extension with wrist in pronation/supination resting off of table, 1x10 pronation/supination -Grip Strength: 7# hand gripper to place and retreive 15 pegs on pegboard -Coordination: Translation, 1x15 palm-finger-palm translation with small bead   -Pinch strength: three point pinch to place and retrieve yellow and red clothespins (13)  -Finger isolation: Flicking foam cube 10 reps with D5    10/11/21 -Weighted Wrist Stretch: 3x30 seconds flexion and extension with use of a half foam roll and 1# weight -Manual therapy: soft tissue mobilization through forearm flexors and extensors to increase ROM and decrease pain.  -P/ROM: 2x3 with prolonged stretch at end range, all wrist motions -A/ROM: 1x10 wrist flexion and extension  -Wrist strengthening: 1x10 flexion and extension with wrist in neutral resting off of table, 1x10 wrist flexion and extension with wrist in pronation/supination resting off of table  -Velcro Board Wrist strengthening: 6 repetitions pronation supination with large roller  -Pinch strength: three point pinch to place and retrieve yellow and red clothespins (13)  -Grip Strength: 1x10 7# hand gripper in neutral, pronation, and supination. Therapist providing support to assist in positioning hand gripper in pronation and supination.  -Coordination: Translation, 1x15 palm-finger-palm translation with small bead  -Finger isolation: Flicking foam cube 3 reps each digit.   10/10/21 -Weighted Wrist Stretch: 3x30 seconds flexion and extension with use of a half foam roll and 1# weight -Manual therapy: soft tissue mobilization through forearm flexors and extensors to increase ROM and decrease pain.  -P/ROM: 2x3 with prolonged stretch at end range, all wrist motions -A/ROM: 1x10 wrist flexion and extension  -Pinch strength: Yellow clothespin to grasp and release foam cubes 3x5 using 3 point pinch  -Coordination: Translation, 1x15 palm-finger-palm translation with medium bead     PATIENT EDUCATION: Education details: Review HEP  Person educated: Patient Education method: Customer service manager Education comprehension: verbalized understanding   HOME EXERCISE PROGRAM Eval: continue CIR finger ROM, washcloth squeeze 09/28/21: Tendon glides and wrist A/ROM 7/13: Theraputty, opposition     ASSESSMENT:   CLINICAL IMPRESSION: A: Pt reports that her apt with  surgeon went well and that she is healing as expected. She no longer needs to be wearing her brace and is using her walker without the forearm support. She was able to tolerate increased weight for wrist stretching and strengthening. She continues with decreased coordination with D1/D5 working on translation with medium bead. Therapist providing stabilization at elbow and  tactile cues for form throughout session.    PLAN:   OT FREQUENCY: 2x/week  OT DURATION: 8 weeks  PLANNED INTERVENTIONS:  self care/ADL training, therapeutic exercise, therapeutic activity, manual therapy, passive range of motion, splinting, electrical stimulation, ultrasound, moist heat, cryotherapy, patient/family education, and DME and/or AE instructions  CONSULTED AND AGREED WITH PLAN OF CARE: Patient  PLAN FOR NEXT SESSION: P: Progress strengthening as tolerated.     Flonnie Hailstone, [place], OTR/L [phone]  10/13/2021, 2:41 PM

## 2021-10-14 DIAGNOSIS — E119 Type 2 diabetes mellitus without complications: Secondary | ICD-10-CM | POA: Diagnosis not present

## 2021-10-14 DIAGNOSIS — I700 Atherosclerosis of aorta: Secondary | ICD-10-CM | POA: Diagnosis not present

## 2021-10-14 DIAGNOSIS — N3281 Overactive bladder: Secondary | ICD-10-CM | POA: Diagnosis not present

## 2021-10-14 DIAGNOSIS — E782 Mixed hyperlipidemia: Secondary | ICD-10-CM | POA: Diagnosis not present

## 2021-10-14 DIAGNOSIS — D649 Anemia, unspecified: Secondary | ICD-10-CM | POA: Diagnosis not present

## 2021-10-14 DIAGNOSIS — L821 Other seborrheic keratosis: Secondary | ICD-10-CM | POA: Diagnosis not present

## 2021-10-14 DIAGNOSIS — I10 Essential (primary) hypertension: Secondary | ICD-10-CM | POA: Diagnosis not present

## 2021-10-14 DIAGNOSIS — K861 Other chronic pancreatitis: Secondary | ICD-10-CM | POA: Diagnosis not present

## 2021-10-14 DIAGNOSIS — Z96642 Presence of left artificial hip joint: Secondary | ICD-10-CM | POA: Diagnosis not present

## 2021-10-15 ENCOUNTER — Encounter: Payer: Self-pay | Admitting: Dermatology

## 2021-10-18 ENCOUNTER — Ambulatory Visit (HOSPITAL_COMMUNITY): Payer: Medicare Other

## 2021-10-18 ENCOUNTER — Encounter (HOSPITAL_COMMUNITY): Payer: Self-pay

## 2021-10-18 ENCOUNTER — Encounter (HOSPITAL_COMMUNITY): Payer: Self-pay | Admitting: Physical Therapy

## 2021-10-18 ENCOUNTER — Ambulatory Visit (HOSPITAL_COMMUNITY): Payer: Medicare Other | Admitting: Physical Therapy

## 2021-10-18 DIAGNOSIS — T1490XA Injury, unspecified, initial encounter: Secondary | ICD-10-CM | POA: Diagnosis not present

## 2021-10-18 DIAGNOSIS — R262 Difficulty in walking, not elsewhere classified: Secondary | ICD-10-CM

## 2021-10-18 DIAGNOSIS — M25532 Pain in left wrist: Secondary | ICD-10-CM | POA: Diagnosis not present

## 2021-10-18 DIAGNOSIS — M25552 Pain in left hip: Secondary | ICD-10-CM

## 2021-10-18 DIAGNOSIS — M25632 Stiffness of left wrist, not elsewhere classified: Secondary | ICD-10-CM | POA: Diagnosis not present

## 2021-10-18 DIAGNOSIS — R29898 Other symptoms and signs involving the musculoskeletal system: Secondary | ICD-10-CM

## 2021-10-18 DIAGNOSIS — M25652 Stiffness of left hip, not elsewhere classified: Secondary | ICD-10-CM

## 2021-10-18 DIAGNOSIS — M6281 Muscle weakness (generalized): Secondary | ICD-10-CM | POA: Diagnosis not present

## 2021-10-18 NOTE — Therapy (Signed)
OUTPATIENT OCCUPATIONAL THERAPY TREATMENT NOTE   Patient Name: Jennifer Porter MRN: [id number] DOB:Jan 25, 1946, 76 y.o., female Today's Date: 10/18/2021  PCP: Dr. Celene Squibb REFERRING PROVIDER: Lauraine Rinne, PA-C (surgeon is Dr. Katha Hamming)   OT End of Session - 10/18/21 1302     Visit Number 9    Number of Visits 16    Date for OT Re-Evaluation 11/21/21    Authorization Type 1) BCBS 2) U[employer] Medicare; $30 copay    Progress Note Due on Visit 10    OT Start Time 1300    OT Stop Time 1340    OT Time Calculation (min) 40 min    Activity Tolerance Patient tolerated treatment well    Behavior During Therapy WFL for tasks assessed/performed             Past Medical History:  Diagnosis Date   Allergy    seasonal   Asthma    Chronic pancreatitis ([place])    Complication of anesthesia    Diabetes mellitus without complication ([place])    GERD (gastroesophageal reflux disease)    hiatal hernia   H/O: gout    Hyperlipidemia    Hypertension    Pancreatitis    PONV (postoperative nausea and vomiting)    Past Surgical History:  Procedure Laterality Date   ABDOMINAL HYSTERECTOMY     APPENDECTOMY     Arthroscopic Left Knee Left 03/22/2003   CHOLECYSTECTOMY     in [place], 2010, without polyps   ESOPHAGOGASTRODUODENOSCOPY (EGD) WITH PROPOFOL N/A 11/08/2018   Procedure: ESOPHAGOGASTRODUODENOSCOPY (EGD) WITH PROPOFOL;  Surgeon: [name], MD;  [facility];  Service: Endoscopy;  Laterality: N/A;   EUS N/A 11/08/2018   Procedure: UPPER ENDOSCOPIC ULTRASOUND (EUS) RADIAL;  Surgeon: [name], MD;  [facility];  Service: Endoscopy;  Laterality: N/A;   INTRAMEDULLARY (IM) NAIL INTERTROCHANTERIC Left 08/27/2021   Procedure: INTRAMEDULLARY (IM) NAIL INTERTROCHANTRIC;  Surgeon: Shona Needles, MD;  Location: [facility];  Service: Orthopedics;  Laterality: Left;   ORIF WRIST FRACTURE Left 08/27/2021   Procedure: OPEN REDUCTION  INTERNAL FIXATION (ORIF) WRIST FRACTURE;  Surgeon: Shona Needles, MD;  Location: [facility];  Service: Orthopedics;  Laterality: Left;   Patient Active Problem List   Diagnosis Date Noted   Pressure injury of skin 09/12/2021   Trauma 09/01/2021   Subdural hematoma ([facility])    Subarachnoid hemorrhage ([facility])    Acute blood loss anemia    Multiple injuries due to trauma 08/26/2021   Colon cancer screening 07/29/2021   Hyperlipidemia associated with type 2 diabetes mellitus ([place]) 11/28/2020   Acute recurrent pansinusitis 07/20/2020   Urinary retention 01/15/2020   Urinary urgency 01/15/2020   Abdominal cramping 08/21/2019   Protein-calorie malnutrition ([place]) 02/18/2019   Chronic pancreatitis ([place]) 08/24/2018   Loose stools 08/24/2018   Pancreatic calcification 08/22/2018   Acute pancreatitis without infection or necrosis 08/09/2018   Sigmoid diverticulosis 08/09/2018   3-vessel CAD 08/09/2018   Aortic atherosclerosis ([place]) 08/09/2018   Myelolipoma of right adrenal gland 08/09/2018   Renal cyst, right 08/09/2018   Hormone replacement therapy 01/12/2017   Hot flashes due to menopause 01/12/2017   Hypomagnesemia 06/27/2016   Diarrhea 03/28/2016   Vitamin D deficiency 10/03/2014   Left knee pain 07/02/2014   Hypertension    Gastroesophageal reflux disease    Type 2 diabetes mellitus without complication ([place])    Allergy    Asthma  H/O: gout    Hyperlipidemia     ONSET DATE: 08/26/21  REFERRING DIAG: Trauma-s/p ORIF of left distal radius fracture  THERAPY DIAG:  Other symptoms and signs involving the musculoskeletal system  Stiffness of left wrist, not elsewhere classified  Pain in left wrist  Rationale for Evaluation and Treatment Rehabilitation  PERTINENT HISTORY: Pt is a 76 y/o female s/p left wrist ORIF on 08/27/21 after falling backwards and landing on her left side on 08/26/21. Pt also with left femur fx and SAH/SDH. Pt presents with platform walker, pre-fabricated wrist  splint, elbow pad. During evaluation, staff calling surgeon's office as no clearance for wrist mobility yet, surgeon's office stating pt clear for OT and PT on LUE and LLE, sending additional referral.   PRECAUTIONS: Other: Per surgeon's office: no protocol, NWB through left wrist. Progress as tolerated.    SUBJECTIVE: S: "I have been using it more, the thumb keeps getting better but its still weak." Apt on the 25th.   PAIN:  Are you having pain? Yes: NPRS scale: 1/10 Pain location: ulnar wrist and base of D1 Pain description: Discomfort, ache Aggravating factors: NA Relieving factors: tylenol, muscle relaxer    OBJECTIVE:     FUNCTIONAL OUTCOME MEASURES: Quick Dash: 75   UPPER EXTREMITY ROM                  Assessed seated, gravity eliminated with exception of wrist extension-against gravity    Active ROM Left eval  Wrist flexion 32  Wrist extension 24  Wrist ulnar deviation 12  Wrist radial deviation 18  Wrist pronation 90  Wrist supination 75  (Blank rows = not tested)     UPPER EXTREMITY MMT:                  Not tested due to pain   MMT Left eval  Elbow flexion    Elbow extension    Wrist flexion    Wrist extension    Wrist ulnar deviation    Wrist radial deviation    Wrist pronation    Wrist supination    (Blank rows = not tested)   HAND FUNCTION: Grip strength: Right: 40 lbs; Left: 0 lbs, Lateral pinch: Right: 12 lbs, Left: 0 lbs, and 3 point pinch: Right: 10 lbs, Left: 0 lbs   COORDINATION: 9 Hole Peg test: Right: 19.0" sec; Left: 39.0" sec     GOALS: Goals reviewed with patient? Yes   SHORT TERM GOALS: Target date: 10/20/2021     Pt will be provided with and educated on HEP to improve mobility of left wrist required for use during ADLs.    Goal status: ongoing   2.  Pt will increase left wrist A/ROM to 35 degrees or greater to improve ability to perform dressing and bathing tasks using LUE as non-dominant.    Goal status: ongoing   3.  Pt  will increase left wrist strength to 3/5 to improve ability to use LUE as assist when completing simple meal preparation tasks.    Goal status: ongoing   4.  Pt will increase left grip strength to 15# and pinch strength to 4# to improve ability to hold and use tools for grooming such as brushes, hair dryers, etc.    Goal status: ongoing     LONG TERM GOALS: Target date: 11/17/2021     Pt will decrease pain in LUE to 3/10 or less to improve ability to use as non-dominant during daily task completion.  Goal status: ongoing   2.  Pt will increase left wrist A/ROM to 50 degrees or greater to improve ability to use LUE to complete laundry tasks.    Goal status: ongoing   3.  Pt will increase left wrist strength to 4/5 or greater to improve ability to complete heavy housework tasks such as vacuuming.    Goal status: ongoing   4.  Pt will increase left grip strength to 25# and pinch strength to 8# or greater to improve ability to maintain hold on pots and pans during meal preparation.   Goal status: ongoing   TODAY'S TREATMENT:   10/18/21 -Weighted Wrist Stretch: 2x30 seconds flexion and extension with use of a half foam roll and 2# weight -Manual therapy: soft tissue mobilization through forearm flexors and extensors to increase ROM and decrease pain.  -P/ROM: 2x3 with prolonged stretch at end range, all wrist motions -A/ROM: 1x10 wrist flexion and extension  -Wrist strengthening: 2# 2x10 flexion and extension with wrist in neutral resting off of table, 2x10 pronation/supination, 2x10 radial/ulnar deviation -Pinch strength: D1/2 pinch to place and retrieve red and yellow (13) clothespins, three point pinch to place and retrieve 4 green clothespins -Coordination: Translation, 1x15 palm-finger-palm translation with small bead  -Finger isolation: Flicking foam cube 10 reps with each digit, no opposition -Grip Strength: 11# hand gripper 1x10   10/11/21 -Weighted Wrist Stretch: 2x30  seconds flexion and extension with use of a half foam roll and 2# weight -Manual therapy: soft tissue mobilization through forearm flexors and extensors to increase ROM and decrease pain.  -P/ROM: 2x3 with prolonged stretch at end range, all wrist motions -A/ROM: 1x10 wrist flexion and extension  -Wrist strengthening: 1# 1x10 flexion and extension with wrist in neutral resting off of table, 1x10 wrist flexion and extension with wrist in pronation/supination resting off of table, 1x10 pronation/supination -Grip Strength: 7# hand gripper to place and retreive 15 pegs on pegboard -Coordination: Translation, 1x15 palm-finger-palm translation with small bead  -Pinch strength: three point pinch to place and retrieve yellow and red clothespins (13)  -Finger isolation: Flicking foam cube 10 reps with D5   10/11/21 -Weighted Wrist Stretch: 3x30 seconds flexion and extension with use of a half foam roll and 1# weight -Manual therapy: soft tissue mobilization through forearm flexors and extensors to increase ROM and decrease pain.  -P/ROM: 2x3 with prolonged stretch at end range, all wrist motions -A/ROM: 1x10 wrist flexion and extension  -Wrist strengthening: 1x10 flexion and extension with wrist in neutral resting off of table, 1x10 wrist flexion and extension with wrist in pronation/supination resting off of table  -Velcro Board Wrist strengthening: 6 repetitions pronation supination with large roller  -Pinch strength: three point pinch to place and retrieve yellow and red clothespins (13)  -Grip Strength: 1x10 7# hand gripper in neutral, pronation, and supination. Therapist providing support to assist in positioning hand gripper in pronation and supination.  -Coordination: Translation, 1x15 palm-finger-palm translation with small bead  -Finger isolation: Flicking foam cube 3 reps each digit.    PATIENT EDUCATION: Education details: Review HEP  Person educated: Patient Education method: Data processing manager Education comprehension: verbalized understanding   HOME EXERCISE PROGRAM Eval: continue CIR finger ROM, washcloth squeeze 09/28/21: Tendon glides and wrist A/ROM 7/13: Theraputty, opposition     ASSESSMENT:   CLINICAL IMPRESSION: A: Pt reports that she continues to use her left hand to do more around the house. She completed grip and pinch strength with increases resistance today, some  discomfort and pulling through the incision site. Modified wrist flexion/extension to complete in neutral due to sensitivity with incision site. Therapist cuing for control with movements, pt with quick/jerky movement. Continues with some difficulty with coordination and isolating finger musculature.    PLAN:   OT FREQUENCY: 2x/week  OT DURATION: 8 weeks  PLANNED INTERVENTIONS:  self care/ADL training, therapeutic exercise, therapeutic activity, manual therapy, passive range of motion, splinting, electrical stimulation, ultrasound, moist heat, cryotherapy, patient/family education, and DME and/or AE instructions  CONSULTED AND AGREED WITH PLAN OF CARE: Patient  PLAN FOR NEXT SESSION: P: Progress strengthening as tolerated.     [address] [place], OTR/L [phone]  10/18/2021, 1:50 PM
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
## 2021-10-21 ENCOUNTER — Ambulatory Visit (HOSPITAL_COMMUNITY): Payer: Medicare Other | Attending: Internal Medicine

## 2021-10-21 ENCOUNTER — Encounter (HOSPITAL_COMMUNITY): Payer: Self-pay

## 2021-10-21 ENCOUNTER — Ambulatory Visit (HOSPITAL_COMMUNITY): Payer: Medicare Other | Admitting: Physical Therapy

## 2021-10-21 DIAGNOSIS — M25552 Pain in left hip: Secondary | ICD-10-CM | POA: Insufficient documentation

## 2021-10-21 DIAGNOSIS — M25652 Stiffness of left hip, not elsewhere classified: Secondary | ICD-10-CM | POA: Diagnosis not present

## 2021-10-21 DIAGNOSIS — M6281 Muscle weakness (generalized): Secondary | ICD-10-CM | POA: Insufficient documentation

## 2021-10-21 DIAGNOSIS — M25532 Pain in left wrist: Secondary | ICD-10-CM | POA: Diagnosis not present

## 2021-10-21 DIAGNOSIS — M25632 Stiffness of left wrist, not elsewhere classified: Secondary | ICD-10-CM | POA: Insufficient documentation

## 2021-10-21 DIAGNOSIS — R29898 Other symptoms and signs involving the musculoskeletal system: Secondary | ICD-10-CM | POA: Diagnosis not present

## 2021-10-21 DIAGNOSIS — R262 Difficulty in walking, not elsewhere classified: Secondary | ICD-10-CM

## 2021-10-21 NOTE — Therapy (Signed)
OUTPATIENT PHYSICAL THERAPY TREATMENT   Patient Name: Jennifer Porter MRN: [id number] DOB:19-Sep-1945, 76 y.o., female Today's Date: 10/21/2021   PT End of Session - 10/21/21 1425     Visit Number 9    Number of Visits 16    Date for PT Re-Evaluation 11/23/21    Authorization Type BCBS state - ded met; 30 copay; no coins; no auth needed    PT Start Time 1347    PT Stop Time 1425    PT Time Calculation (min) 38 min    Equipment Utilized During Treatment Gait belt    Activity Tolerance Patient tolerated treatment well;Patient limited by fatigue    Behavior During Therapy WFL for tasks assessed/performed                Past Medical History:  Diagnosis Date   Allergy    seasonal   Asthma    Chronic pancreatitis ([place])    Complication of anesthesia    Diabetes mellitus without complication ([place])    GERD (gastroesophageal reflux disease)    hiatal hernia   H/O: gout    Hyperlipidemia    Hypertension    Pancreatitis    PONV (postoperative nausea and vomiting)    Past Surgical History:  Procedure Laterality Date   ABDOMINAL HYSTERECTOMY     APPENDECTOMY     Arthroscopic Left Knee Left 03/22/2003   CHOLECYSTECTOMY     in [place], 2010, without polyps   ESOPHAGOGASTRODUODENOSCOPY (EGD) WITH PROPOFOL N/A 11/08/2018   Procedure: ESOPHAGOGASTRODUODENOSCOPY (EGD) WITH PROPOFOL;  Surgeon: [name], MD;  [facility];  Service: Endoscopy;  Laterality: N/A;   EUS N/A 11/08/2018   Procedure: UPPER ENDOSCOPIC ULTRASOUND (EUS) RADIAL;  Surgeon: [name], MD;  Location: [facility];  Service: Endoscopy;  Laterality: N/A;   INTRAMEDULLARY (IM) NAIL INTERTROCHANTERIC Left 08/27/2021   Procedure: INTRAMEDULLARY (IM) NAIL INTERTROCHANTRIC;  Surgeon: Shona Needles, MD;  Location: [facility];  Service: Orthopedics;  Laterality: Left;   ORIF WRIST FRACTURE Left 08/27/2021   Procedure: OPEN REDUCTION INTERNAL FIXATION (ORIF) WRIST FRACTURE;   Surgeon: Shona Needles, MD;  Location: [facility];  Service: Orthopedics;  Laterality: Left;   Patient Active Problem List   Diagnosis Date Noted   Pressure injury of skin 09/12/2021   Trauma 09/01/2021   Subdural hematoma ([facility])    Subarachnoid hemorrhage ([facility])    Acute blood loss anemia    Multiple injuries due to trauma 08/26/2021   Colon cancer screening 07/29/2021   Hyperlipidemia associated with type 2 diabetes mellitus ([place]) 11/28/2020   Acute recurrent pansinusitis 07/20/2020   Urinary retention 01/15/2020   Urinary urgency 01/15/2020   Abdominal cramping 08/21/2019   Protein-calorie malnutrition ([place]) 02/18/2019   Chronic pancreatitis ([place]) 08/24/2018   Loose stools 08/24/2018   Pancreatic calcification 08/22/2018   Acute pancreatitis without infection or necrosis 08/09/2018   Sigmoid diverticulosis 08/09/2018   3-vessel CAD 08/09/2018   Aortic atherosclerosis ([place]) 08/09/2018   Myelolipoma of right adrenal gland 08/09/2018   Renal cyst, right 08/09/2018   Hormone replacement therapy 01/12/2017   Hot flashes due to menopause 01/12/2017   Hypomagnesemia 06/27/2016   Diarrhea 03/28/2016   Vitamin D deficiency 10/03/2014   Left knee pain 07/02/2014   Hypertension    Gastroesophageal reflux disease    Type 2 diabetes mellitus without complication ([place])    Allergy    Asthma    H/O: gout    Hyperlipidemia  PCP: Celene Squibb, MD  REFERRING PROVIDER: Cathlyn Parsons, PA-C   and verbal from surgeon Haddix, Thomasene Lot, MD  REFERRING DIAG: Trauma   THERAPY DIAG:  Pain in left hip  Difficulty in walking, not elsewhere classified  Stiffness of left hip, not elsewhere classified  Muscle weakness (generalized)  Rationale for Evaluation and Treatment Rehabilitation  ONSET DATE: 08/26/2021 trauma, 08/27/2021 surgery L hip ORIF  SUBJECTIVE:   SUBJECTIVE STATEMENT : Pt states that she was walking a lot yesterday and she could tell.   She is doing her exercises.    PERTINENT HISTORY: L hip ORIF surgery 08/27/21 L wrist ORIF sugery 08/27/21 L knee arthritis  DM - denies N/T into feet currently, reports intermittently does get these symptoms present before trauma Hx of back fracture - no current back symptoms  Chronic pancreatitis, gallbladder removed, multiple comorbidities  PAIN:  Are you having pain? Yes: NPRS scale: 0/10 Pain location: L hip  Pain description: sore Aggravating factors: movement Relieving factors: rest    PRECAUTIONS: Fall  WEIGHT BEARING RESTRICTIONS  WBAT L LE - (of note, NWB to L UE being treated by OT but please advise for PT treatments)  FALLS:  Has patient fallen in last 6 months? Yes. Number of falls 1 this current trauma  LIVING ENVIRONMENT: Lives with: lives with their spouse Lives in: House/apartment Stairs: Yes: External: 1 steps; modified with a ramp currently Has following equipment at home: Walker - 2 wheeled and elevated toilet commode seat over regular toilet  OCCUPATION: retired Education officer, [employer]; very active around the house and with husband  PLOF: Independent  PATIENT GOALS "get back to be able to do what I usually do"   OBJECTIVE:   DIAGNOSTIC FINDINGS: Quoted from MD note upon hospital discharge  ''DG [place] 2-3 VIEWS LEFT  Result Date: 09/15/2021 CLINICAL DATA:  Left hip surgery 3 weeks ago EXAM: DG HIP (WITH OR WITHOUT PELVIS) 2-3V LEFT COMPARISON:  08/27/2021 FINDINGS: Postsurgical changes from left femur ORIF of intertrochanteric left femur fracture. Alignment is unchanged compared to the immediate postoperative images including mildly displaced lesser trochanteric fragment. No new fractures. Hip joint intact without dislocation. Previously seen soft tissue air has resolved. IMPRESSION: Satisfactory appearance of the left hip following recent ORIF. Electronically Signed   By: Davina Poke D.O.   On: 09/15/2021 15:46 "  PATIENT SURVEYS:  FOTO 33  EDEMA:  Circumferential:  ankle circ L 24 cm verse R 23 cm with visibule edema at left foot  POSTURE: forward head, flexed trunk , and weight shift right  PALPATION: TTP at gross left hip into quad, left knee with light touch  LOWER EXTREMITY ROM:  Active ROM Right eval Left eval  Knee flexion WNL WNL  Knee extension WNL WNL  Ankle dorsiflexion WNL WNL  Ankle plantarflexion WNL WNL   (Blank rows = not tested)  LOWER EXTREMITY MMT: modified all testing in sitting secondary to gross trauma/post op comfort  MMT Right eval Left eval  Hip flexion 4+ 2+  Hip extension 4   Hip abduction 4+ 2+  Hip adduction 4+ 3  Knee flexion 4+ 3  Knee extension 4+ 3  Ankle dorsiflexion 4+ 3  Ankle plantarflexion 4+ 3   (Blank rows = not tested)  LOWER EXTREMITY SPECIAL TESTS:  Hip special tests: NA secondary to post op   FUNCTIONAL TESTS:  2 minute walk test: 90 feet with notes in below section  GAIT: Distance walked: 90 feet Assistive  device utilized: Environmental consultant - 2 wheeled and platform for L UE Level of assistance: SBA Comments: Decreased stride length and decreased push off on L LE, small step through pattern limited flexion use    TODAY'S TREATMENT: 10/21/21            Gt with cane x 226 ft             Heel raise x 15            Functional squat x 10             Weight shifting reaching for the apple to the rt and LT x 10 each            Stomp to the right and then to the left 5 x each             Marching x 20            Step up 4" step x 15            Step down from 4" step x 5             Lunging onto 4" step x 10            Side step x 2 Rt             Sitting:              Sit to stand on mat raised position x 15   10/18/21 Alternating march 2x 10 bilateral  Standing hip abduction 2# 3x 10 bilateral Standing hip extension 2# 2 x 10  Step up 4 inch 2x 10 bilateral  Gait with SPC 450 feet STS with black foam 2 x 10    10/13/21 HR/TR x 20  Alternating march 2x 10  Step up 4 inch 2x 10  bilateral  Standing hip abduction 2# 2x 10 bilateral Gait  with SPC 226 feet STS with black foam 1x 10    10/07/21: Gait training with SPC 120 feet Rockerboard lateral 32mn Heel/toe raises 20x Toe tapping 6in step height alternating 20x Forward lunges onto 6" step with UE assist 2X10 Tandem stance 2x 30" Sidestep 2RT hovering no HHA Lt LE step up 4in step 2X10 with UE assist STS no HHA elevated height with black foam, cueing for eccentric control 10x   10/05/21: Gait training with RW 200 to improve stride length Rockerboard lateral 296m Walking with cueing for Lt arm swing 3RT inside // bars, Rt UE holding on Heel/toe raises 2x 10 Toe tapping 6in step height alternating 20x Tandem stance 2x 30" Sidestep 2RT hovering no HHA Lt LE step up 4in step Squats 2x 10 Gait training with SPC, cueing for sequence, heel to toe mechanics and step thru 2RT down blue line  STS no HHA elevated height with black foam, cueing for eccentric control 10x    PATIENT EDUCATION:  Education details: 7/31 probable DOMS 09/22/21 - evaluation findings, POC, PT scope of practice, education on edema management and DVT risk, HEP Person educated: Patient Education method: Explanation, Demonstration, and Handouts Education comprehension: verbalized understanding   HOME EXERCISE PROGRAM: Access Code: [email or web address]: [email or web address]  10/13/21- Sit to Stand with Arms Crossed  - 1 x daily - 7 x weekly - 2 sets - 10 reps  Date: 09/22/2021 - Seated March  - 2 x daily - 7 x weekly - 2 sets - 10 reps - Seated Isometric Hip Adduction with BaDiona Foley- 2  x daily - 7 x weekly - 2 sets - 10 reps - Seated Single Leg Hip Abduction  - 2 x daily - 7 x weekly - 2 sets - 10 reps 09/24/21 add red theraband - Seated Heel Toe Raises  - 3-5 x daily - 7 x weekly - 2 sets - 10 reps  ASSESSMENT:  CLINICAL IMPRESSION:   Therapist encouraged pt to walk in her home with the cane more frequently.  PT is fearful on new  activity.  Therapist reminded pt that it has been 8 weeks since injury and she is well healed at this point she needs to get her strength, balance and confidence up.   Patient will continue to benefit from physical therapy in order to improve function and reduce impairment.     OBJECTIVE IMPAIRMENTS Abnormal gait, decreased activity tolerance, decreased balance, decreased endurance, decreased mobility, difficulty walking, decreased strength, increased edema, impaired UE functional use, postural dysfunction, and pain.   ACTIVITY LIMITATIONS carrying, lifting, bending, standing, squatting, stairs, bathing, toileting, locomotion level, and caring for others  PARTICIPATION LIMITATIONS: meal prep, cleaning, laundry, driving, community activity, and yard work  PERSONAL FACTORS Age, Transportation, and 3+ comorbidities: DM, L UE trauma, L knee arthritis/pain chronic  are also affecting patient's functional outcome.   REHAB POTENTIAL: Good  CLINICAL DECISION MAKING: Evolving/moderate complexity  EVALUATION COMPLEXITY: Moderate   GOALS: Goals reviewed with patient? Yes  SHORT TERM GOALS: Target date: 10/20/2021  Patient will be independent with initial HEP and self-management strategies to improve functional outcomes   Baseline: 09/22/21 - initiated today Goal status: IN PROGRESS  2.  Patient will be able to demonstrate at least 4-/5 for gross L LE strength to improve functional support on L LE and progress function Baseline: 09/22/21 - see objective measures, L hip 2+/5 for hip flexion  Goal status: IN PROGRESS  3.  Patient will be able to demonstrate ability to ambulate with equal step length and no pain in order to progress L LE functional strength and Wbing.  Baseline: 09/22/21 - decreased stride length left Goal status: IN PROGRESS   LONG TERM GOALS: Target date: 11/17/21   Patient will be independent with advanced HEP and self-management strategies to improve functional outcomes    Baseline: 09/22/21 - to be established Goal status: IN PROGRESS  2.  Patient will demonstrate improved functional abilities by improving FOTO by at least 15 points to 47 or higher.    Baseline: 09/22/21 - current 33 Goal status: IN PROGRESS  3.  Patient will be able to ambulate without AD independently for at least 250 feet during 2 minute walk test in order to access community distance.  Baseline: 09/22/21 - 90 feet Goal status: IN PROGRESS    PLAN: PT FREQUENCY: 2x/week  PT DURATION: 8 weeks  PLANNED INTERVENTIONS: Therapeutic exercises, Therapeutic activity, Neuromuscular re-education, Balance training, Gait training, Patient/Family education, Joint mobilization, Stair training, Spinal mobilization, Cryotherapy, Moist heat, scar mobilization, Taping, Manual therapy, and Re-evaluation  PLAN FOR NEXT SESSION:  Continue to progress Lt hip stabilization and slow increased L LE WB strengthening.      Rayetta Humphrey, PT CLT [phone]  10/21/2021, 2:28 PM

## 2021-10-21 NOTE — Therapy (Signed)
OUTPATIENT OCCUPATIONAL THERAPY TREATMENT NOTE and Progress Note   Patient Name: Jennifer Porter MRN: [id number] DOB:[birth year], 76 y.o., female Today's Date: 10/21/2021   Progress Note Reporting Period 09/22/21 to 10/21/21  See note below for Objective Data and Assessment of Progress/Goals.    PCP: Dr. Celene Squibb REFERRING PROVIDER: Lauraine Rinne, PA-C (surgeon is Dr. Katha Hamming)   OT End of Session - 10/21/21 1306     Visit Number 10    Number of Visits 16    Date for OT Re-Evaluation 11/21/21    Authorization Type 1) BCBS 2) [employer] Medicare; $30 copay    Progress Note Due on Visit 10    OT Start Time 1302    OT Stop Time 1345    OT Time Calculation (min) 43 min    Activity Tolerance Patient tolerated treatment well    Behavior During Therapy WFL for tasks assessed/performed             Past Medical History:  Diagnosis Date   Allergy    seasonal   Asthma    Chronic pancreatitis ([place])    Complication of anesthesia    Diabetes mellitus without complication ([place])    GERD (gastroesophageal reflux disease)    hiatal hernia   H/O: gout    Hyperlipidemia    Hypertension    Pancreatitis    PONV (postoperative nausea and vomiting)    Past Surgical History:  Procedure Laterality Date   ABDOMINAL HYSTERECTOMY     APPENDECTOMY     Arthroscopic Left Knee Left 03/22/2003   CHOLECYSTECTOMY     in [place], 2010, without polyps   ESOPHAGOGASTRODUODENOSCOPY (EGD) WITH PROPOFOL N/A 11/08/2018   Procedure: ESOPHAGOGASTRODUODENOSCOPY (EGD) WITH PROPOFOL;  Surgeon: [name], MD;  [facility];  Service: Endoscopy;  Laterality: N/A;   EUS N/A 11/08/2018   Procedure: UPPER ENDOSCOPIC ULTRASOUND (EUS) RADIAL;  Surgeon: [name], MD;  [facility];  Service: Endoscopy;  Laterality: N/A;   INTRAMEDULLARY (IM) NAIL INTERTROCHANTERIC Left 08/27/2021   Procedure: INTRAMEDULLARY (IM) NAIL INTERTROCHANTRIC;  Surgeon:  Shona Needles, MD;  Location: [facility];  Service: Orthopedics;  Laterality: Left;   ORIF WRIST FRACTURE Left 08/27/2021   Procedure: OPEN REDUCTION INTERNAL FIXATION (ORIF) WRIST FRACTURE;  Surgeon: Shona Needles, MD;  Location: [facility];  Service: Orthopedics;  Laterality: Left;   Patient Active Problem List   Diagnosis Date Noted   Pressure injury of skin 09/12/2021   Trauma 09/01/2021   Subdural hematoma ([facility])    Subarachnoid hemorrhage ([facility])    Acute blood loss anemia    Multiple injuries due to trauma 08/26/2021   Colon cancer screening 07/29/2021   Hyperlipidemia associated with type 2 diabetes mellitus ([place]) 11/28/2020   Acute recurrent pansinusitis 07/20/2020   Urinary retention 01/15/2020   Urinary urgency 01/15/2020   Abdominal cramping 08/21/2019   Protein-calorie malnutrition ([place]) 02/18/2019   Chronic pancreatitis ([place]) 08/24/2018   Loose stools 08/24/2018   Pancreatic calcification 08/22/2018   Acute pancreatitis without infection or necrosis 08/09/2018   Sigmoid diverticulosis 08/09/2018   3-vessel CAD 08/09/2018   Aortic atherosclerosis ([place]) 08/09/2018   Myelolipoma of right adrenal gland 08/09/2018   Renal cyst, right 08/09/2018   Hormone replacement therapy 01/12/2017   Hot flashes due to menopause 01/12/2017   Hypomagnesemia 06/27/2016   Diarrhea 03/28/2016   Vitamin D deficiency 10/03/2014   Left knee pain 07/02/2014   Hypertension  Gastroesophageal reflux disease    Type 2 diabetes mellitus without complication ([facility])    Allergy    Asthma    H/O: gout    Hyperlipidemia     ONSET DATE: 08/26/21  REFERRING DIAG: Trauma-s/p ORIF of left distal radius fracture  THERAPY DIAG:  Pain in left wrist  Stiffness of left wrist, not elsewhere classified  Other symptoms and signs involving the musculoskeletal system  Rationale for Evaluation and Treatment Rehabilitation  PERTINENT HISTORY: Pt is a 76 y/o female s/p left wrist ORIF on 08/27/21 after falling  backwards and landing on her left side on 08/26/21. Pt also with left femur fx and SAH/SDH. Pt presents with platform walker, pre-fabricated wrist splint, elbow pad. During evaluation, staff calling surgeon's office as no clearance for wrist mobility yet, surgeon's office stating pt clear for OT and PT on LUE and LLE, sending additional referral.   PRECAUTIONS: Other: Per surgeon's office: no protocol, NWB through left wrist. Progress as tolerated.    SUBJECTIVE: S: "I was using it for everything and it started to ache a little bit. I am squeezing a dishcloth."  PAIN:  Are you having pain? Yes: NPRS scale: 1/10 Pain location: ulnar wrist and base of D1 Pain description: Discomfort, ache Aggravating factors: NA Relieving factors: tylenol, muscle relaxer    OBJECTIVE:     FUNCTIONAL OUTCOME MEASURES: Quick Dash: 75 8/3: 38.64   UPPER EXTREMITY ROM                  Assessed seated, gravity eliminated with exception of wrist extension-against gravity    Active ROM Left eval Left 8/3  Wrist flexion 32 59  Wrist extension 24 35  Wrist ulnar deviation 12 12  Wrist radial deviation 18 21  Wrist pronation 90 90  Wrist supination 75 87  (Blank rows = not tested)     UPPER EXTREMITY MMT:                  Not tested due to pain   MMT Left eval Left  8/3  Elbow flexion   4/5  Elbow extension   5/5  Wrist flexion   4/5  Wrist extension   4-/5  Wrist ulnar deviation   4+/5  Wrist radial deviation   4+/5  Wrist pronation   4/5  Wrist supination   4-/5  (Blank rows = not tested)   HAND FUNCTION: Grip strength: Right: 40 lbs; Left: 0 lbs, Lateral pinch: Right: 12 lbs, Left: 0 lbs, and 3 point pinch: Right: 10 lbs, Left: 0 lbs  8/3 Left Lateral: 6lbs; 3 point: 6lbs Grip Strength: 16 lbs   COORDINATION: 9 Hole Peg test: Right: 19.0" sec; Left: 39.0" sec Left: 29.75"      GOALS: Goals reviewed with patient? Yes   SHORT TERM GOALS: Target date: 10/20/2021     Pt will be  provided with and educated on HEP to improve mobility of left wrist required for use during ADLs.    Goal status: MET    2.  Pt will increase left wrist A/ROM to 35 degrees or greater to improve ability to perform dressing and bathing tasks using LUE as non-dominant.    Goal status: MET   3.  Pt will increase left wrist strength to 3/5 to improve ability to use LUE as assist when completing simple meal preparation tasks.    Goal status: MET   4.  Pt will increase left grip strength to 15# and pinch  strength to 4# to improve ability to hold and use tools for grooming such as brushes, hair dryers, etc.    Goal status: MET     LONG TERM GOALS: Target date: 11/17/2021     Pt will decrease pain in LUE to 3/10 or less to improve ability to use as non-dominant during daily task completion.    Goal status: MET   2.  Pt will increase left wrist A/ROM to 50 degrees or greater to improve ability to use LUE to complete laundry tasks.    Goal status: ongoing   3.  Pt will increase left wrist strength to 4/5 or greater to improve ability to complete heavy housework tasks such as vacuuming.    Goal status: ongoing   4.  Pt will increase left grip strength to 25# and pinch strength to 8# or greater to improve ability to maintain hold on pots and pans during meal preparation.   Goal status: ongoing   TODAY'S TREATMENT:   10/21/21 -Weighted Wrist Stretch: 2x30 seconds flexion and extension with use of a half foam roll and 2# weight -Wrist strengthening: 2# 2x10 flexion and extension with wrist in neutral resting off of table, 2x10 pronation/supination, 2x10 radial/ulnar deviation -Pinch Strength: Red clothespin used to grasp 10 sponges, 5 foam cubes -Coordination: Translation, 1x15 palm-finger-palm translation with small bead, focus on D1/5    10/18/21 -Weighted Wrist Stretch: 2x30 seconds flexion and extension with use of a half foam roll and 2# weight -Manual therapy: soft tissue  mobilization through forearm flexors and extensors to increase ROM and decrease pain.  -P/ROM: 2x3 with prolonged stretch at end range, all wrist motions -A/ROM: 1x10 wrist flexion and extension  -Wrist strengthening: 2# 2x10 flexion and extension with wrist in neutral resting off of table, 2x10 pronation/supination, 2x10 radial/ulnar deviation -Pinch strength: D1/2 pinch to place and retrieve red and yellow (13) clothespins, three point pinch to place and retrieve 4 green clothespins -Coordination: Translation, 1x15 palm-finger-palm translation with small bead  -Finger isolation: Flicking foam cube 10 reps with each digit, no opposition -Grip Strength: 11# hand gripper 1x10   10/11/21 -Weighted Wrist Stretch: 2x30 seconds flexion and extension with use of a half foam roll and 2# weight -Manual therapy: soft tissue mobilization through forearm flexors and extensors to increase ROM and decrease pain.  -P/ROM: 2x3 with prolonged stretch at end range, all wrist motions -A/ROM: 1x10 wrist flexion and extension  -Wrist strengthening: 1# 1x10 flexion and extension with wrist in neutral resting off of table, 1x10 wrist flexion and extension with wrist in pronation/supination resting off of table, 1x10 pronation/supination -Grip Strength: 7# hand gripper to place and retreive 15 pegs on pegboard -Coordination: Translation, 1x15 palm-finger-palm translation with small bead  -Pinch strength: three point pinch to place and retrieve yellow and red clothespins (13)  -Finger isolation: Flicking foam cube 10 reps with D5     PATIENT EDUCATION: Education details: Scar Massage and desensitization   Person educated: Patient Education method: Explanation and Demonstration Education comprehension: verbalized understanding   HOME EXERCISE PROGRAM Eval: continue CIR finger ROM, washcloth squeeze 09/28/21: Tendon glides and wrist A/ROM 7/13: Theraputty, opposition  8/3: Scar Massage and desensitization      ASSESSMENT:   CLINICAL IMPRESSION: A: Measurements taken for progress note today with pt demonstrating improvements in pain, range of motion, strength, and ability to complete daily tasks. She continues with mild pain occurring at the thumb with light resistance exercises. Decreased coordination with D1/5 opposition with translation and  small bead, pt requiring increased time. Continued to progress strengthening as tolerated. Plan to address some incision site sensitivities.   PLAN:   OT FREQUENCY: 2x/week  OT DURATION: 8 weeks  PLANNED INTERVENTIONS:  self care/ADL training, therapeutic exercise, therapeutic activity, manual therapy, passive range of motion, splinting, electrical stimulation, ultrasound, moist heat, cryotherapy, patient/family education, and DME and/or AE instructions  CONSULTED AND AGREED WITH PLAN OF CARE: Patient  PLAN FOR NEXT SESSION: P: Progress strengthening as tolerated. Scar Massage and desensitization     Mathews Robinsons, OTR/L [phone]  10/21/2021, 3:43 PM

## 2021-10-26 ENCOUNTER — Encounter (HOSPITAL_COMMUNITY): Payer: Self-pay

## 2021-10-26 ENCOUNTER — Ambulatory Visit (HOSPITAL_COMMUNITY): Payer: Medicare Other | Admitting: Physical Therapy

## 2021-10-26 ENCOUNTER — Ambulatory Visit (HOSPITAL_COMMUNITY): Payer: Medicare Other

## 2021-10-26 ENCOUNTER — Encounter (HOSPITAL_COMMUNITY): Payer: Self-pay | Admitting: Physical Therapy

## 2021-10-26 DIAGNOSIS — M6281 Muscle weakness (generalized): Secondary | ICD-10-CM

## 2021-10-26 DIAGNOSIS — R262 Difficulty in walking, not elsewhere classified: Secondary | ICD-10-CM | POA: Diagnosis not present

## 2021-10-26 DIAGNOSIS — R29898 Other symptoms and signs involving the musculoskeletal system: Secondary | ICD-10-CM | POA: Diagnosis not present

## 2021-10-26 DIAGNOSIS — M25552 Pain in left hip: Secondary | ICD-10-CM

## 2021-10-26 DIAGNOSIS — M25532 Pain in left wrist: Secondary | ICD-10-CM | POA: Diagnosis not present

## 2021-10-26 DIAGNOSIS — M25652 Stiffness of left hip, not elsewhere classified: Secondary | ICD-10-CM | POA: Diagnosis not present

## 2021-10-26 DIAGNOSIS — M25632 Stiffness of left wrist, not elsewhere classified: Secondary | ICD-10-CM | POA: Diagnosis not present

## 2021-10-26 NOTE — Therapy (Signed)
OUTPATIENT PHYSICAL THERAPY TREATMENT   Patient Name: Jennifer Porter MRN: [id number] DOB:1946-02-12, 76 y.o., female Today's Date: 10/26/2021  Progress Note   Reporting Period 09/22/21 to 10/26/21   See note below for Objective Data and Assessment of Progress/Goals    PT End of Session - 10/26/21 1350     Visit Number 10    Number of Visits 16    Date for PT Re-Evaluation 11/23/21    Authorization Type BCBS state - ded met; 30 copay; no coins; no auth needed    Progress Note Due on Visit 20    PT Start Time 1347    PT Stop Time 1430    PT Time Calculation (min) 43 min    Equipment Utilized During Treatment Gait belt    Activity Tolerance Patient tolerated treatment well;Patient limited by fatigue    Behavior During Therapy WFL for tasks assessed/performed                Past Medical History:  Diagnosis Date   Allergy    seasonal   Asthma    Chronic pancreatitis ([place])    Complication of anesthesia    Diabetes mellitus without complication ([place])    GERD (gastroesophageal reflux disease)    hiatal hernia   H/O: gout    Hyperlipidemia    Hypertension    Pancreatitis    PONV (postoperative nausea and vomiting)    Past Surgical History:  Procedure Laterality Date   ABDOMINAL HYSTERECTOMY     APPENDECTOMY     Arthroscopic Left Knee Left 03/22/2003   CHOLECYSTECTOMY     in [place], 2010, without polyps   ESOPHAGOGASTRODUODENOSCOPY (EGD) WITH PROPOFOL N/A 11/08/2018   Procedure: ESOPHAGOGASTRODUODENOSCOPY (EGD) WITH PROPOFOL;  Surgeon: [name], MD;  Location: [facility];  Service: Endoscopy;  Laterality: N/A;   EUS N/A 11/08/2018   Procedure: UPPER ENDOSCOPIC ULTRASOUND (EUS) RADIAL;  Surgeon: [name], MD;  Location: [facility];  Service: Endoscopy;  Laterality: N/A;   INTRAMEDULLARY (IM) NAIL INTERTROCHANTERIC Left 08/27/2021   Procedure: INTRAMEDULLARY (IM) NAIL INTERTROCHANTRIC;  Surgeon: Shona Needles, MD;   Location: [facility];  Service: Orthopedics;  Laterality: Left;   ORIF WRIST FRACTURE Left 08/27/2021   Procedure: OPEN REDUCTION INTERNAL FIXATION (ORIF) WRIST FRACTURE;  Surgeon: Shona Needles, MD;  Location: [place];  Service: Orthopedics;  Laterality: Left;   Patient Active Problem List   Diagnosis Date Noted   Pressure injury of skin 09/12/2021   Trauma 09/01/2021   Subdural hematoma (HCC)    Subarachnoid hemorrhage (HCC)    Acute blood loss anemia    Multiple injuries due to trauma 08/26/2021   Colon cancer screening 07/29/2021   Hyperlipidemia associated with type 2 diabetes mellitus ([place]) 11/28/2020   Acute recurrent pansinusitis 07/20/2020   Urinary retention 01/15/2020   Urinary urgency 01/15/2020   Abdominal cramping 08/21/2019   Protein-calorie malnutrition ([place]) 02/18/2019   Chronic pancreatitis ([place]) 08/24/2018   Loose stools 08/24/2018   Pancreatic calcification 08/22/2018   Acute pancreatitis without infection or necrosis 08/09/2018   Sigmoid diverticulosis 08/09/2018   3-vessel CAD 08/09/2018   Aortic atherosclerosis ([place]) 08/09/2018   Myelolipoma of right adrenal gland 08/09/2018   Renal cyst, right 08/09/2018   Hormone replacement therapy 01/12/2017   Hot flashes due to menopause 01/12/2017   Hypomagnesemia 06/27/2016   Diarrhea 03/28/2016   Vitamin D deficiency 10/03/2014   Left knee pain 07/02/2014   Hypertension  Gastroesophageal reflux disease    Type 2 diabetes mellitus without complication (HCC)    Allergy    Asthma    H/O: gout    Hyperlipidemia     PCP: Celene Squibb, MD  REFERRING PROVIDER: Cathlyn Parsons, PA-C   and verbal from surgeon Haddix, Thomasene Lot, MD  REFERRING DIAG: Trauma   THERAPY DIAG:  Pain in left hip  Difficulty in walking, not elsewhere classified  Stiffness of left hip, not elsewhere classified  Muscle weakness (generalized)  Rationale for Evaluation and Treatment Rehabilitation  ONSET DATE: 08/26/2021 trauma,  08/27/2021 surgery L hip ORIF  SUBJECTIVE:   SUBJECTIVE STATEMENT : Patient states she has been doing exercises at home and they are going alright. Patient states close to 60% improvement with PT intervention. She still hurts and has pain. Improving mobility with ADL. She has not returned to grocery shopping.   PERTINENT HISTORY: L hip ORIF surgery 08/27/21 L wrist ORIF sugery 08/27/21 L knee arthritis  DM - denies N/T into feet currently, reports intermittently does get these symptoms present before trauma Hx of back fracture - no current back symptoms  Chronic pancreatitis, gallbladder removed, multiple comorbidities  PAIN:  Are you having pain? Yes: NPRS scale: 0/10 Pain location: L hip  Pain description: sore Aggravating factors: movement Relieving factors: rest    PRECAUTIONS: Fall  WEIGHT BEARING RESTRICTIONS  WBAT L LE - (of note, NWB to L UE being treated by OT but please advise for PT treatments)  FALLS:  Has patient fallen in last 6 months? Yes. Number of falls 1 this current trauma  LIVING ENVIRONMENT: Lives with: lives with their spouse Lives in: House/apartment Stairs: Yes: External: 1 steps; modified with a ramp currently Has following equipment at home: Walker - 2 wheeled and elevated toilet commode seat over regular toilet  OCCUPATION: retired Education officer, [employer]; very active around the house and with husband  PLOF: Independent  PATIENT GOALS "get back to be able to do what I usually do"   OBJECTIVE:   DIAGNOSTIC FINDINGS: Quoted from MD note upon hospital discharge  ''DG [place] 2-3 VIEWS LEFT  Result Date: 09/15/2021 CLINICAL DATA:  Left hip surgery 3 weeks ago EXAM: DG HIP (WITH OR WITHOUT PELVIS) 2-3V LEFT COMPARISON:  08/27/2021 FINDINGS: Postsurgical changes from left femur ORIF of intertrochanteric left femur fracture. Alignment is unchanged compared to the immediate postoperative images including mildly displaced lesser trochanteric  fragment. No new fractures. Hip joint intact without dislocation. Previously seen soft tissue air has resolved. IMPRESSION: Satisfactory appearance of the left hip following recent ORIF. Electronically Signed   By: Davina Poke D.O.   On: 09/15/2021 15:46 "  PATIENT SURVEYS:  FOTO 33  10/26/21 41% function  EDEMA:  Circumferential: ankle circ L 24 cm verse R 23 cm with visibule edema at left foot  POSTURE: forward head, flexed trunk , and weight shift right  PALPATION: TTP at gross left hip into quad, left knee with light touch  LOWER EXTREMITY ROM:  Active ROM Right eval Left eval  Knee flexion WNL WNL  Knee extension WNL WNL  Ankle dorsiflexion WNL WNL  Ankle plantarflexion WNL WNL   (Blank rows = not tested)  LOWER EXTREMITY MMT: eval: modified all testing in sitting secondary to gross trauma/post op comfort 10/26/21:: able to tes tL  hip abduction in partial R sidelying and sitting  MMT Right eval Left eval Right  10/26/21 Left 10/26/21  Hip flexion 4+ 2+ 4+  4  Hip extension 4     Hip abduction 4+ 2+ 4+ 4  Hip adduction 4+ 3    Knee flexion 4+ 3 4+ 4  Knee extension 4+ 3 4+ 4  Ankle dorsiflexion 4+ 3 4+ 4+  Ankle plantarflexion 4+ 3     (Blank rows = not tested)  LOWER EXTREMITY SPECIAL TESTS:  Hip special tests: NA secondary to post op   FUNCTIONAL TESTS:  2 minute walk test: 90 feet with notes in below section  GAIT: Distance walked: 90 feet Assistive device utilized: Walker - 2 wheeled and platform for L UE Level of assistance: SBA Comments: Decreased stride length and decreased push off on L LE, small step through pattern limited flexion use  Reassessment 10/26/21 2MWT 200 feet with SPC MMT FOTO  TODAY'S TREATMENT: 10/26/21 Standing March 2x 10  HR x 15 TR x 15 Reassessment Step up 4 inch 1x 15 bilateral Lateral step up 4 inch 1x 10 bilateral  Forward step down 1x 10 LLE  10/21/21            Gt with cane x 226 ft             Heel raise x 15             Functional squat x 10             Weight shifting reaching for the apple to the rt and LT x 10 each            Stomp to the right and then to the left 5 x each             Marching x 20            Step up 4" step x 15            Step down from 4" step x 5             Lunging onto 4" step x 10            Side step x 2 Rt             Sitting:              Sit to stand on mat raised position x 15   10/18/21 Alternating march 2x 10 bilateral  Standing hip abduction 2# 3x 10 bilateral Standing hip extension 2# 2 x 10  Step up 4 inch 2x 10 bilateral  Gait with SPC 450 feet STS with black foam 2 x 10    10/13/21 HR/TR x 20  Alternating march 2x 10  Step up 4 inch 2x 10 bilateral  Standing hip abduction 2# 2x 10 bilateral Gait  with SPC 226 feet STS with black foam 1x 10      PATIENT EDUCATION:  Education details: 7/31 probable DOMS 09/22/21 - evaluation findings, POC, PT scope of practice, education on edema management and DVT risk, HEP Person educated: Patient Education method: Explanation, Demonstration, and Handouts Education comprehension: verbalized understanding   HOME EXERCISE PROGRAM: Access Code: [email or web address] URL: [email or web address]  10/13/21- Sit to Stand with Arms Crossed  - 1 x daily - 7 x weekly - 2 sets - 10 reps  Date: 09/22/2021 - Seated March  - 2 x daily - 7 x weekly - 2 sets - 10 reps - Seated Isometric Hip Adduction with Ball  - 2 x daily - 7 x weekly - 2 sets - 10 reps - Seated Single  Leg Hip Abduction  - 2 x daily - 7 x weekly - 2 sets - 10 reps 09/24/21 add red theraband - Seated Heel Toe Raises  - 3-5 x daily - 7 x weekly - 2 sets - 10 reps  ASSESSMENT:  CLINICAL IMPRESSION:   Patient states 2/3 short term goals and 0/3 long term goals with ability to complete HEP and improvement in strength. Remaining goals not met at this time due to continued symptoms and deficits in strength, gait, balance, functional mobility, activity tolerance.  Patient has made great progress toward goals and is ambulating with improving mechanics. Patient also with chronic knee pain which is making progress slow and may limit outcomes. Continued with functional strengthening following reassessment. Patient will continue to benefit from physical therapy in order to improve function and reduce impairment.     OBJECTIVE IMPAIRMENTS Abnormal gait, decreased activity tolerance, decreased balance, decreased endurance, decreased mobility, difficulty walking, decreased strength, increased edema, impaired UE functional use, postural dysfunction, and pain.   ACTIVITY LIMITATIONS carrying, lifting, bending, standing, squatting, stairs, bathing, toileting, locomotion level, and caring for others  PARTICIPATION LIMITATIONS: meal prep, cleaning, laundry, driving, community activity, and yard work  PERSONAL FACTORS Age, Transportation, and 3+ comorbidities: DM, L UE trauma, L knee arthritis/pain chronic  are also affecting patient's functional outcome.   REHAB POTENTIAL: Good  CLINICAL DECISION MAKING: Evolving/moderate complexity  EVALUATION COMPLEXITY: Moderate   GOALS: Goals reviewed with patient? Yes  SHORT TERM GOALS: Target date: 10/20/2021  Patient will be independent with initial HEP and self-management strategies to improve functional outcomes   Baseline: 09/22/21 - initiated today Goal status: MET  2.  Patient will be able to demonstrate at least 4-/5 for gross L LE strength to improve functional support on L LE and progress function Baseline: 09/22/21 - see objective measures, L hip 2+/5 for hip flexion 8/8 MET see above Goal status: MET  3.  Patient will be able to demonstrate ability to ambulate with equal step length and no pain in order to progress L LE functional strength and [place].  Baseline: 09/22/21 - decreased stride length left 10/26/21 decreased R step length, decreased L hip extension, slight antalgic on L 8/8 not equal and continued  symptoms Goal status: IN PROGRESS   LONG TERM GOALS: Target date: 11/17/21   Patient will be independent with advanced HEP and self-management strategies to improve functional outcomes   Baseline: 09/22/21 - to be established Goal status: IN PROGRESS  2.  Patient will demonstrate improved functional abilities by improving FOTO by at least 15 points to 47 or higher.    Baseline: 09/22/21 - current 33  10/26/21 41% function Goal status: IN PROGRESS  3.  Patient will be able to ambulate without AD independently for at least 250 feet during 2 minute walk test in order to access community distance.  Baseline: 09/22/21 - 90 feet 10/26/21 200 feet with SPC Goal status: IN PROGRESS    PLAN: PT FREQUENCY: 2x/week  PT DURATION: 8 weeks  PLANNED INTERVENTIONS: Therapeutic exercises, Therapeutic activity, Neuromuscular re-education, Balance training, Gait training, Patient/Family education, Joint mobilization, Stair training, Spinal mobilization, Cryotherapy, Moist heat, scar mobilization, Taping, Manual therapy, and Re-evaluation  PLAN FOR NEXT SESSION:  Continue to progress Lt hip stabilization and slow increased L LE WB strengthening.      2:33 PM, 10/26/21 Mearl Latin PT, DPT Physical Therapist at [facility]

## 2021-10-26 NOTE — Therapy (Signed)
OUTPATIENT OCCUPATIONAL THERAPY TREATMENT NOTE   Patient Name: Jennifer Porter MRN: [id number] DOB:January 30, 1946, 76 y.o., female Today's Date: 10/26/2021   Progress Note Reporting Period 09/22/21 to 10/21/21  See note below for Objective Data and Assessment of Progress/Goals.    PCP: Dr. Celene Squibb REFERRING PROVIDER: Lauraine Rinne, PA-C (surgeon is Dr. Katha Hamming)   OT End of Session - 10/26/21 1304     Visit Number 11    Number of Visits 16    Date for OT Re-Evaluation 11/21/21    Authorization Type 1) BCBS 2) [employer] Medicare; $30 copay    Progress Note Due on Visit 10    OT Start Time 1301    OT Stop Time 1341    OT Time Calculation (min) 40 min    Activity Tolerance Patient tolerated treatment well    Behavior During Therapy WFL for tasks assessed/performed             Past Medical History:  Diagnosis Date   Allergy    seasonal   Asthma    Chronic pancreatitis ([place])    Complication of anesthesia    Diabetes mellitus without complication ([place])    GERD (gastroesophageal reflux disease)    hiatal hernia   H/O: gout    Hyperlipidemia    Hypertension    Pancreatitis    PONV (postoperative nausea and vomiting)    Past Surgical History:  Procedure Laterality Date   ABDOMINAL HYSTERECTOMY     APPENDECTOMY     Arthroscopic Left Knee Left 03/22/2003   CHOLECYSTECTOMY     in [place], 2010, without polyps   ESOPHAGOGASTRODUODENOSCOPY (EGD) WITH PROPOFOL N/A 11/08/2018   Procedure: ESOPHAGOGASTRODUODENOSCOPY (EGD) WITH PROPOFOL;  Surgeon: [name], MD;  [facility];  Service: Endoscopy;  Laterality: N/A;   EUS N/A 11/08/2018   Procedure: UPPER ENDOSCOPIC ULTRASOUND (EUS) RADIAL;  Surgeon: [name], MD;  [facility];  Service: Endoscopy;  Laterality: N/A;   INTRAMEDULLARY (IM) NAIL INTERTROCHANTERIC Left 08/27/2021   Procedure: INTRAMEDULLARY (IM) NAIL INTERTROCHANTRIC;  Surgeon: Shona Needles, MD;   Location: [place];  Service: Orthopedics;  Laterality: Left;   ORIF WRIST FRACTURE Left 08/27/2021   Procedure: OPEN REDUCTION INTERNAL FIXATION (ORIF) WRIST FRACTURE;  Surgeon: Shona Needles, MD;  Location: [place];  Service: Orthopedics;  Laterality: Left;   Patient Active Problem List   Diagnosis Date Noted   Pressure injury of skin 09/12/2021   Trauma 09/01/2021   Subdural hematoma (HCC)    Subarachnoid hemorrhage (HCC)    Acute blood loss anemia    Multiple injuries due to trauma 08/26/2021   Colon cancer screening 07/29/2021   Hyperlipidemia associated with type 2 diabetes mellitus ([place]) 11/28/2020   Acute recurrent pansinusitis 07/20/2020   Urinary retention 01/15/2020   Urinary urgency 01/15/2020   Abdominal cramping 08/21/2019   Protein-calorie malnutrition ([place]) 02/18/2019   Chronic pancreatitis ([place]) 08/24/2018   Loose stools 08/24/2018   Pancreatic calcification 08/22/2018   Acute pancreatitis without infection or necrosis 08/09/2018   Sigmoid diverticulosis 08/09/2018   3-vessel CAD 08/09/2018   Aortic atherosclerosis ([place]) 08/09/2018   Myelolipoma of right adrenal gland 08/09/2018   Renal cyst, right 08/09/2018   Hormone replacement therapy 01/12/2017   Hot flashes due to menopause 01/12/2017   Hypomagnesemia 06/27/2016   Diarrhea 03/28/2016   Vitamin D deficiency 10/03/2014   Left knee pain 07/02/2014   Hypertension    Gastroesophageal reflux  disease    Type 2 diabetes mellitus without complication ([place])    Allergy    Asthma    H/O: gout    Hyperlipidemia     ONSET DATE: 08/26/21  REFERRING DIAG: Trauma-s/p ORIF of left distal radius fracture  THERAPY DIAG:  Pain in left wrist  Stiffness of left wrist, not elsewhere classified  Other symptoms and signs involving the musculoskeletal system  Rationale for Evaluation and Treatment Rehabilitation  PERTINENT HISTORY: Pt is a 76 y/o female s/p left wrist ORIF on 08/27/21 after falling backwards and landing on  her left side on 08/26/21. Pt also with left femur fx and SAH/SDH. Pt presents with platform walker, pre-fabricated wrist splint, elbow pad. During evaluation, staff calling surgeon's office as no clearance for wrist mobility yet, surgeon's office stating pt clear for OT and PT on LUE and LLE, sending additional referral.   PRECAUTIONS: Other: Per surgeon's office: no protocol, NWB through left wrist. Progress as tolerated.    SUBJECTIVE: S: "I can use it to turn the faucet and to wash dishes a little more."  PAIN:  Are you having pain? Yes: NPRS scale: 1/10 Pain location: ulnar wrist and base of D1 Pain description: Discomfort, ache Aggravating factors: NA Relieving factors: tylenol, muscle relaxer    OBJECTIVE:     FUNCTIONAL OUTCOME MEASURES: Quick Dash: 75 8/3: 38.64   UPPER EXTREMITY ROM                  Assessed seated, gravity eliminated with exception of wrist extension-against gravity    Active ROM Left eval Left 8/3  Wrist flexion 32 59  Wrist extension 24 35  Wrist ulnar deviation 12 12  Wrist radial deviation 18 21  Wrist pronation 90 90  Wrist supination 75 87  (Blank rows = not tested)     UPPER EXTREMITY MMT:                  Not tested due to pain   MMT Left eval Left  8/3  Elbow flexion   4/5  Elbow extension   5/5  Wrist flexion   4/5  Wrist extension   4-/5  Wrist ulnar deviation   4+/5  Wrist radial deviation   4+/5  Wrist pronation   4/5  Wrist supination   4-/5  (Blank rows = not tested)   HAND FUNCTION: Grip strength: Right: 40 lbs; Left: 0 lbs, Lateral pinch: Right: 12 lbs, Left: 0 lbs, and 3 point pinch: Right: 10 lbs, Left: 0 lbs  8/3 Left Lateral: 6lbs; 3 point: 6lbs Grip Strength: 16 lbs   COORDINATION: 9 Hole Peg test: Right: 19.0" sec; Left: 39.0" sec Left: 29.75"      GOALS: Goals reviewed with patient? Yes   SHORT TERM GOALS: Target date: 10/20/2021     Pt will be provided with and educated on HEP to improve mobility  of left wrist required for use during ADLs.    Goal status: MET    2.  Pt will increase left wrist A/ROM to 35 degrees or greater to improve ability to perform dressing and bathing tasks using LUE as non-dominant.    Goal status: MET   3.  Pt will increase left wrist strength to 3/5 to improve ability to use LUE as assist when completing simple meal preparation tasks.    Goal status: MET   4.  Pt will increase left grip strength to 15# and pinch strength to 4# to improve ability  to hold and use tools for grooming such as brushes, hair dryers, etc.    Goal status: MET     LONG TERM GOALS: Target date: 11/17/2021     Pt will decrease pain in LUE to 3/10 or less to improve ability to use as non-dominant during daily task completion.    Goal status: MET   2.  Pt will increase left wrist A/ROM to 50 degrees or greater to improve ability to use LUE to complete laundry tasks.    Goal status: ongoing   3.  Pt will increase left wrist strength to 4/5 or greater to improve ability to complete heavy housework tasks such as vacuuming.    Goal status: ongoing   4.  Pt will increase left grip strength to 25# and pinch strength to 8# or greater to improve ability to maintain hold on pots and pans during meal preparation.   Goal status: ongoing   TODAY'S TREATMENT:    10/26/21 -Wrist strengthening: 2# 2x10 flexion and extension with wrist in neutral resting off of table, 2x10 pronation/supination, 2x10 radial/ulnar deviation -Rubber Band finger extension, 2x15 -Radial/ulnar deviation to manipulate nuts and bolts, 1x7, with D1/5 -Coordination: Translation to retrieve and place small pegs on pegboard, 1x15 -Grip Strength: 2x15, 3 lbs -Pinch Strength: red clothespin used to grasp 2x 6 foam cubes, maintained contraction throughout  -Manual therapy: soft tissue mobilization completed to flexors and extensors and hand musculature to decrease pain and fascial restrictions -Scar tissue  mobilization    10/21/21 -Weighted Wrist Stretch: 2x30 seconds flexion and extension with use of a half foam roll and 2# weight -Wrist strengthening: 2# 2x10 flexion and extension with wrist in neutral resting off of table, 2x10 pronation/supination, 2x10 radial/ulnar deviation -Pinch Strength: Red clothespin used to grasp 10 sponges, 5 foam cubes -Coordination: Translation, 1x15 palm-finger-palm translation with small bead, focus on D1/5    10/18/21 -Weighted Wrist Stretch: 2x30 seconds flexion and extension with use of a half foam roll and 2# weight -Manual therapy: soft tissue mobilization through forearm flexors and extensors to increase ROM and decrease pain.  -P/ROM: 2x3 with prolonged stretch at end range, all wrist motions -A/ROM: 1x10 wrist flexion and extension  -Wrist strengthening: 2# 2x10 flexion and extension with wrist in neutral resting off of table, 2x10 pronation/supination, 2x10 radial/ulnar deviation -Pinch strength: D1/2 pinch to place and retrieve red and yellow (13) clothespins, three point pinch to place and retrieve 4 green clothespins -Coordination: Translation, 1x15 palm-finger-palm translation with small bead  -Finger isolation: Flicking foam cube 10 reps with each digit, no opposition -Grip Strength: 11# hand gripper 1x10      PATIENT EDUCATION: Education details: Review HEP Person educated: Patient Education method: Customer service manager Education comprehension: verbalized understanding   HOME EXERCISE PROGRAM Eval: continue CIR finger ROM, washcloth squeeze 09/28/21: Tendon glides and wrist A/ROM 7/13: Theraputty, opposition  8/3: Scar Massage and desensitization     ASSESSMENT:   CLINICAL IMPRESSION: A: Strengthening and coordination were the main focus of today's session as that is what pt reports her largest deficit is. Strengthening partially limited by pain at the thumb. Increased endurance noted today, although continues to require  rest breaks. Therapist cuing for decreased elbow compensation with wrist strengthening. Improved coordination today with D1/5 although still requiring increased time. Tolerated scar tissue massage although some discomfort noted.    PLAN:   OT FREQUENCY: 2x/week  OT DURATION: 8 weeks  PLANNED INTERVENTIONS:  self care/ADL training, therapeutic exercise, therapeutic activity, manual  therapy, passive range of motion, splinting, electrical stimulation, ultrasound, moist heat, cryotherapy, patient/family education, and DME and/or AE instructions  CONSULTED AND AGREED WITH PLAN OF CARE: Patient  PLAN FOR NEXT SESSION: P: Progress strengthening as tolerated. Scar Massage and desensitization     Mathews Robinsons, OTR/L [phone]  10/26/2021, 3:47 PM

## 2021-11-02 ENCOUNTER — Ambulatory Visit (HOSPITAL_COMMUNITY): Payer: Medicare Other

## 2021-11-02 ENCOUNTER — Ambulatory Visit (HOSPITAL_COMMUNITY): Payer: Medicare Other | Admitting: Physical Therapy

## 2021-11-02 ENCOUNTER — Encounter (HOSPITAL_COMMUNITY): Payer: Self-pay | Admitting: Physical Therapy

## 2021-11-02 DIAGNOSIS — R262 Difficulty in walking, not elsewhere classified: Secondary | ICD-10-CM

## 2021-11-02 DIAGNOSIS — M25552 Pain in left hip: Secondary | ICD-10-CM

## 2021-11-02 DIAGNOSIS — R29898 Other symptoms and signs involving the musculoskeletal system: Secondary | ICD-10-CM

## 2021-11-02 DIAGNOSIS — M25532 Pain in left wrist: Secondary | ICD-10-CM | POA: Diagnosis not present

## 2021-11-02 DIAGNOSIS — M25652 Stiffness of left hip, not elsewhere classified: Secondary | ICD-10-CM | POA: Diagnosis not present

## 2021-11-02 DIAGNOSIS — M25632 Stiffness of left wrist, not elsewhere classified: Secondary | ICD-10-CM

## 2021-11-02 DIAGNOSIS — M6281 Muscle weakness (generalized): Secondary | ICD-10-CM | POA: Diagnosis not present

## 2021-11-02 NOTE — Therapy (Signed)
OUTPATIENT OCCUPATIONAL THERAPY TREATMENT NOTE   Patient Name: Jennifer Porter MRN: [id number] DOB:1945-04-14, 76 y.o., female Today's Date: 11/02/2021   Progress Note Reporting Period 09/22/21 to 10/21/21  See note below for Objective Data and Assessment of Progress/Goals.    PCP: Dr. Celene Squibb REFERRING PROVIDER: Lauraine Rinne, PA-C (surgeon is Dr. Katha Hamming)   OT End of Session - 11/02/21 1411     Visit Number 12    Number of Visits 16    Date for OT Re-Evaluation 11/21/21    Authorization Type 1) BCBS 2) U[employer] Medicare; $30 copay    Progress Note Due on Visit 10    OT Start Time 1300    OT Stop Time 1342    OT Time Calculation (min) 42 min    Activity Tolerance Patient tolerated treatment well    Behavior During Therapy WFL for tasks assessed/performed              Past Medical History:  Diagnosis Date   Allergy    seasonal   Asthma    Chronic pancreatitis ([place])    Complication of anesthesia    Diabetes mellitus without complication ([place])    GERD (gastroesophageal reflux disease)    hiatal hernia   H/O: gout    Hyperlipidemia    Hypertension    Pancreatitis    PONV (postoperative nausea and vomiting)    Past Surgical History:  Procedure Laterality Date   ABDOMINAL HYSTERECTOMY     APPENDECTOMY     Arthroscopic Left Knee Left 03/22/2003   CHOLECYSTECTOMY     in [place], 2010, without polyps   ESOPHAGOGASTRODUODENOSCOPY (EGD) WITH PROPOFOL N/A 11/08/2018   Procedure: ESOPHAGOGASTRODUODENOSCOPY (EGD) WITH PROPOFOL;  Surgeon: [name], MD;  [facility];  Service: Endoscopy;  Laterality: N/A;   EUS N/A 11/08/2018   Procedure: UPPER ENDOSCOPIC ULTRASOUND (EUS) RADIAL;  Surgeon: [name], MD;  [facility];  Service: Endoscopy;  Laterality: N/A;   INTRAMEDULLARY (IM) NAIL INTERTROCHANTERIC Left 08/27/2021   Procedure: INTRAMEDULLARY (IM) NAIL INTERTROCHANTRIC;  Surgeon: Shona Needles, MD;   Location: [facility];  Service: Orthopedics;  Laterality: Left;   ORIF WRIST FRACTURE Left 08/27/2021   Procedure: OPEN REDUCTION INTERNAL FIXATION (ORIF) WRIST FRACTURE;  Surgeon: Shona Needles, MD;  Location: [facility];  Service: Orthopedics;  Laterality: Left;   Patient Active Problem List   Diagnosis Date Noted   Pressure injury of skin 09/12/2021   Trauma 09/01/2021   Subdural hematoma (HCC)    Subarachnoid hemorrhage (HCC)    Acute blood loss anemia    Multiple injuries due to trauma 08/26/2021   Colon cancer screening 07/29/2021   Hyperlipidemia associated with type 2 diabetes mellitus ([place]) 11/28/2020   Acute recurrent pansinusitis 07/20/2020   Urinary retention 01/15/2020   Urinary urgency 01/15/2020   Abdominal cramping 08/21/2019   Protein-calorie malnutrition ([place]) 02/18/2019   Chronic pancreatitis ([place]) 08/24/2018   Loose stools 08/24/2018   Pancreatic calcification 08/22/2018   Acute pancreatitis without infection or necrosis 08/09/2018   Sigmoid diverticulosis 08/09/2018   3-vessel CAD 08/09/2018   Aortic atherosclerosis ([place]) 08/09/2018   Myelolipoma of right adrenal gland 08/09/2018   Renal cyst, right 08/09/2018   Hormone replacement therapy 01/12/2017   Hot flashes due to menopause 01/12/2017   Hypomagnesemia 06/27/2016   Diarrhea 03/28/2016   Vitamin D deficiency 10/03/2014   Left knee pain 07/02/2014   Hypertension    Gastroesophageal  reflux disease    Type 2 diabetes mellitus without complication ([place])    Allergy    Asthma    H/O: gout    Hyperlipidemia     ONSET DATE: 08/26/21  REFERRING DIAG: Trauma-s/p ORIF of left distal radius fracture  THERAPY DIAG:  Stiffness of left wrist, not elsewhere classified  Other symptoms and signs involving the musculoskeletal system  Rationale for Evaluation and Treatment Rehabilitation  PERTINENT HISTORY: Pt is a 76 y/o female s/p left wrist ORIF on 08/27/21 after falling backwards and landing on her left side on  08/26/21. Pt also with left femur fx and SAH/SDH. Pt presents with platform walker, pre-fabricated wrist splint, elbow pad. During evaluation, staff calling surgeon's office as no clearance for wrist mobility yet, surgeon's office stating pt clear for OT and PT on LUE and LLE, sending additional referral.   PRECAUTIONS: Other: Per surgeon's office: no protocol, NWB through left wrist. Progress as tolerated.    SUBJECTIVE: S: "I picked up a bag with some stuff in it." And " I'm able to push the needle into my fingers with my L hand to take my blood sugar"  PAIN:  Are you having pain? No   OBJECTIVE:     FUNCTIONAL OUTCOME MEASURES: Quick Dash: 75 8/3: 38.64   UPPER EXTREMITY ROM                  Assessed seated, gravity eliminated with exception of wrist extension-against gravity    Active ROM Left eval Left 8/3  Wrist flexion 32 59  Wrist extension 24 35  Wrist ulnar deviation 12 12  Wrist radial deviation 18 21  Wrist pronation 90 90  Wrist supination 75 87  (Blank rows = not tested)     UPPER EXTREMITY MMT:                  Not tested due to pain   MMT Left eval Left  8/3  Elbow flexion   4/5  Elbow extension   5/5  Wrist flexion   4/5  Wrist extension   4-/5  Wrist ulnar deviation   4+/5  Wrist radial deviation   4+/5  Wrist pronation   4/5  Wrist supination   4-/5  (Blank rows = not tested)   HAND FUNCTION: Grip strength: Right: 40 lbs; Left: 0 lbs, Lateral pinch: Right: 12 lbs, Left: 0 lbs, and 3 point pinch: Right: 10 lbs, Left: 0 lbs  8/3 Left Lateral: 6lbs; 3 point: 6lbs Grip Strength: 16 lbs   COORDINATION: 9 Hole Peg test: Right: 19.0" sec; Left: 39.0" sec Left: 29.75"      GOALS: Goals reviewed with patient? Yes   SHORT TERM GOALS: Target date: 10/20/2021     Pt will be provided with and educated on HEP to improve mobility of left wrist required for use during ADLs.    Goal status: MET    2.  Pt will increase left wrist A/ROM to 35 degrees  or greater to improve ability to perform dressing and bathing tasks using LUE as non-dominant.    Goal status: MET   3.  Pt will increase left wrist strength to 3/5 to improve ability to use LUE as assist when completing simple meal preparation tasks.    Goal status: MET   4.  Pt will increase left grip strength to 15# and pinch strength to 4# to improve ability to hold and use tools for grooming such as brushes, hair dryers, etc.  Goal status: MET     LONG TERM GOALS: Target date: 11/17/2021     Pt will decrease pain in LUE to 3/10 or less to improve ability to use as non-dominant during daily task completion.    Goal status: MET   2.  Pt will increase left wrist A/ROM to 50 degrees or greater to improve ability to use LUE to complete laundry tasks.    Goal status: ongoing   3.  Pt will increase left wrist strength to 4/5 or greater to improve ability to complete heavy housework tasks such as vacuuming.    Goal status: ongoing   4.  Pt will increase left grip strength to 25# and pinch strength to 8# or greater to improve ability to maintain hold on pots and pans during meal preparation.   Goal status: ongoing   TODAY'S TREATMENT:  11/02/21 -Passive Stretching: flexion, extension, radial/ulnar deviation -Wrist strengthening: 2# 2x10 flexion and extension with wrist in neutral resting off of table, 2x10 pronation/supination, 2x10 radial/ulnar deviation -Coordination: D1/5 pinch to retrieve and place 16 small manipulatives, x2 sets -Finger strengthening: yellow rubber band, abduction x15 reps -grip strength: using a PVC Pipe with a full grip to push down into red theraputty, 1x20 reps -Pinch Strength: green clothespin used to grasp 1x 5 foam cubes, Red clothespin 1x5  foam cubes due to fatigue after green clothespin   10/26/21 -Wrist strengthening: 2# 2x10 flexion and extension with wrist in neutral resting off of table, 2x10 pronation/supination, 2x10 radial/ulnar  deviation -Rubber Band finger extension, 2x15 -Radial/ulnar deviation to manipulate nuts and bolts, 1x7, with D1/5 -Coordination: Translation to retrieve and place small pegs on pegboard, 1x15 -Grip Strength: 2x15, 3 lbs -Pinch Strength: red clothespin used to grasp 2x 6 foam cubes, maintained contraction throughout  -Manual therapy: soft tissue mobilization completed to flexors and extensors and hand musculature to decrease pain and fascial restrictions -Scar tissue mobilization    10/21/21 -Weighted Wrist Stretch: 2x30 seconds flexion and extension with use of a half foam roll and 2# weight -Wrist strengthening: 2# 2x10 flexion and extension with wrist in neutral resting off of table, 2x10 pronation/supination, 2x10 radial/ulnar deviation -Pinch Strength: Red clothespin used to grasp 10 sponges, 5 foam cubes -Coordination: Translation, 1x15 palm-finger-palm translation with small bead, focus on D1/5     PATIENT EDUCATION: Education details: Review HEP Person educated: Patient Education method: Customer service manager Education comprehension: verbalized understanding   HOME EXERCISE PROGRAM Eval: continue CIR finger ROM, washcloth squeeze 09/28/21: Tendon glides and wrist A/ROM 7/13: Theraputty, opposition  8/3: Scar Massage and desensitization     ASSESSMENT:   CLINICAL IMPRESSION: A: This session, patient demonstrated increased strength and coordination with pinching and gripping tasks. She continues to report pain in the thumb with opposition/resistance activities. She continues to improve with endurance, only requiring rest breaks towards the end of the session after multiple exercises. Continues to require cuing for limiting compensation from elbow. Tolerated wrist stretches this session with minimal initial pain/discomfort noted with wrist flexion.     PLAN:   OT FREQUENCY: 2x/week  OT DURATION: 8 weeks  PLANNED INTERVENTIONS:  self care/ADL training,  therapeutic exercise, therapeutic activity, manual therapy, passive range of motion, splinting, electrical stimulation, ultrasound, moist heat, cryotherapy, patient/family education, and DME and/or AE instructions  CONSULTED AND AGREED WITH PLAN OF CARE: Patient  PLAN FOR NEXT SESSION: P: Progress strengthening as tolerated. Scar Massage and desensitization     Mathews Robinsons, OTR/L [phone]  11/02/2021, 2:25 PM

## 2021-11-02 NOTE — Therapy (Signed)
OUTPATIENT PHYSICAL THERAPY TREATMENT   Patient Name: Jennifer Porter MRN: [id number] DOB:29-Apr-1945, 76 y.o., female Today's Date: 11/02/2021  Progress Note   Reporting Period 09/22/21 to 10/26/21   See note below for Objective Data and Assessment of Progress/Goals    PT End of Session - 11/02/21 1339     Visit Number 11    Number of Visits 16    Date for PT Re-Evaluation 11/23/21    Authorization Type BCBS state - ded met; 30 copay; no coins; no auth needed    Progress Note Due on Visit 20    PT Start Time 1345    PT Stop Time 1425    PT Time Calculation (min) 40 min    Equipment Utilized During Treatment Gait belt    Activity Tolerance Patient tolerated treatment well;Patient limited by fatigue    Behavior During Therapy WFL for tasks assessed/performed                Past Medical History:  Diagnosis Date   Allergy    seasonal   Asthma    Chronic pancreatitis ([place])    Complication of anesthesia    Diabetes mellitus without complication ([place])    GERD (gastroesophageal reflux disease)    hiatal hernia   H/O: gout    Hyperlipidemia    Hypertension    Pancreatitis    PONV (postoperative nausea and vomiting)    Past Surgical History:  Procedure Laterality Date   ABDOMINAL HYSTERECTOMY     APPENDECTOMY     Arthroscopic Left Knee Left 03/22/2003   CHOLECYSTECTOMY     in [place], 2010, without polyps   ESOPHAGOGASTRODUODENOSCOPY (EGD) WITH PROPOFOL N/A 11/08/2018   Procedure: ESOPHAGOGASTRODUODENOSCOPY (EGD) WITH PROPOFOL;  Surgeon: [name], MD;  [facility];  Service: Endoscopy;  Laterality: N/A;   EUS N/A 11/08/2018   Procedure: UPPER ENDOSCOPIC ULTRASOUND (EUS) RADIAL;  Surgeon: [name], MD;  [facility];  Service: Endoscopy;  Laterality: N/A;   INTRAMEDULLARY (IM) NAIL INTERTROCHANTERIC Left 08/27/2021   Procedure: INTRAMEDULLARY (IM) NAIL INTERTROCHANTRIC;  Surgeon: Shona Needles, MD;   Location: [facility];  Service: Orthopedics;  Laterality: Left;   ORIF WRIST FRACTURE Left 08/27/2021   Procedure: OPEN REDUCTION INTERNAL FIXATION (ORIF) WRIST FRACTURE;  Surgeon: Shona Needles, MD;  Location: [facility];  Service: Orthopedics;  Laterality: Left;   Patient Active Problem List   Diagnosis Date Noted   Pressure injury of skin 09/12/2021   Trauma 09/01/2021   Subdural hematoma ([facility])    Subarachnoid hemorrhage ([facility])    Acute blood loss anemia    Multiple injuries due to trauma 08/26/2021   Colon cancer screening 07/29/2021   Hyperlipidemia associated with type 2 diabetes mellitus ([place]) 11/28/2020   Acute recurrent pansinusitis 07/20/2020   Urinary retention 01/15/2020   Urinary urgency 01/15/2020   Abdominal cramping 08/21/2019   Protein-calorie malnutrition ([place]) 02/18/2019   Chronic pancreatitis ([place]) 08/24/2018   Loose stools 08/24/2018   Pancreatic calcification 08/22/2018   Acute pancreatitis without infection or necrosis 08/09/2018   Sigmoid diverticulosis 08/09/2018   3-vessel CAD 08/09/2018   Aortic atherosclerosis ([place]) 08/09/2018   Myelolipoma of right adrenal gland 08/09/2018   Renal cyst, right 08/09/2018   Hormone replacement therapy 01/12/2017   Hot flashes due to menopause 01/12/2017   Hypomagnesemia 06/27/2016   Diarrhea 03/28/2016   Vitamin D deficiency 10/03/2014   Left knee pain 07/02/2014   Hypertension  Gastroesophageal reflux disease    Type 2 diabetes mellitus without complication ([facility])    Allergy    Asthma    H/O: gout    Hyperlipidemia     PCP: Celene Squibb, MD  REFERRING PROVIDER: Cathlyn Parsons, PA-C   and verbal from surgeon Haddix, Thomasene Lot, MD  REFERRING DIAG: Trauma   THERAPY DIAG:  Pain in left hip  Difficulty in walking, not elsewhere classified  Stiffness of left hip, not elsewhere classified  Muscle weakness (generalized)  Rationale for Evaluation and Treatment Rehabilitation  ONSET DATE: 08/26/2021 trauma,  08/27/2021 surgery L hip ORIF  SUBJECTIVE:   SUBJECTIVE STATEMENT : Patient states her leg is healing better. No falls. Been doing exercises. Leg still hurts when first getting up.  PERTINENT HISTORY: L hip ORIF surgery 08/27/21 L wrist ORIF sugery 08/27/21 L knee arthritis  DM - denies N/T into feet currently, reports intermittently does get these symptoms present before trauma Hx of back fracture - no current back symptoms  Chronic pancreatitis, gallbladder removed, multiple comorbidities  PAIN:  Are you having pain? Yes: NPRS scale: 0/10 Pain location: L hip  Pain description: sore Aggravating factors: movement Relieving factors: rest    PRECAUTIONS: Fall  WEIGHT BEARING RESTRICTIONS  WBAT L LE - (of note, NWB to L UE being treated by OT but please advise for PT treatments)  FALLS:  Has patient fallen in last 6 months? Yes. Number of falls 1 this current trauma  LIVING ENVIRONMENT: Lives with: lives with their spouse Lives in: House/apartment Stairs: Yes: External: 1 steps; modified with a ramp currently Has following equipment at home: Walker - 2 wheeled and elevated toilet commode seat over regular toilet  OCCUPATION: retired Education officer, [employer]; very active around the house and with husband  PLOF: Independent  PATIENT GOALS "get back to be able to do what I usually do"   OBJECTIVE:   DIAGNOSTIC FINDINGS: Quoted from MD note upon hospital discharge  ''DG [place] 2-3 VIEWS LEFT  Result Date: 09/15/2021 CLINICAL DATA:  Left hip surgery 3 weeks ago EXAM: DG HIP (WITH OR WITHOUT PELVIS) 2-3V LEFT COMPARISON:  08/27/2021 FINDINGS: Postsurgical changes from left femur ORIF of intertrochanteric left femur fracture. Alignment is unchanged compared to the immediate postoperative images including mildly displaced lesser trochanteric fragment. No new fractures. Hip joint intact without dislocation. Previously seen soft tissue air has resolved. IMPRESSION: Satisfactory  appearance of the left hip following recent ORIF. Electronically Signed   By: Davina Poke D.O.   On: 09/15/2021 15:46 "  PATIENT SURVEYS:  FOTO 33  10/26/21 41% function  EDEMA:  Circumferential: ankle circ L 24 cm verse R 23 cm with visibule edema at left foot  POSTURE: forward head, flexed trunk , and weight shift right  PALPATION: TTP at gross left hip into quad, left knee with light touch  LOWER EXTREMITY ROM:  Active ROM Right eval Left eval  Knee flexion WNL WNL  Knee extension WNL WNL  Ankle dorsiflexion WNL WNL  Ankle plantarflexion WNL WNL   (Blank rows = not tested)  LOWER EXTREMITY MMT: eval: modified all testing in sitting secondary to gross trauma/post op comfort 10/26/21:: able to tes tL  hip abduction in partial R sidelying and sitting  MMT Right eval Left eval Right  10/26/21 Left 10/26/21  Hip flexion 4+ 2+ 4+ 4  Hip extension 4     Hip abduction 4+ 2+ 4+ 4  Hip adduction 4+ 3  Knee flexion 4+ 3 4+ 4  Knee extension 4+ 3 4+ 4  Ankle dorsiflexion 4+ 3 4+ 4+  Ankle plantarflexion 4+ 3     (Blank rows = not tested)  LOWER EXTREMITY SPECIAL TESTS:  Hip special tests: NA secondary to post op   FUNCTIONAL TESTS:  2 minute walk test: 90 feet with notes in below section  GAIT: Distance walked: 90 feet Assistive device utilized: Walker - 2 wheeled and platform for L UE Level of assistance: SBA Comments: Decreased stride length and decreased push off on L LE, small step through pattern limited flexion use  Reassessment 10/26/21 2MWT 200 feet with SPC MMT FOTO  TODAY'S TREATMENT: 11/02/21 HR x 15 TR x 15 Step up 6 inch 2 x 10 Lateral step up 6 inch 2x 10 Gait with SPC 450 feet with SPC Standing hip abduction RTB 2x 10 bilateral  Standing hip extension RTB 2x 10 bilateral  Forward step down 4 inch 1 x 10 LLE Nu step seat 5 level 5 5 minutes    10/26/21 Standing March 2x 10  HR x 15 TR x 15 Reassessment Step up 4 inch 1x 15 bilateral Lateral  step up 4 inch 1x 10 bilateral  Forward step down 1x 10 LLE  10/21/21            Gt with cane x 226 ft             Heel raise x 15            Functional squat x 10             Weight shifting reaching for the apple to the rt and LT x 10 each            Stomp to the right and then to the left 5 x each             Marching x 20            Step up 4" step x 15            Step down from 4" step x 5             Lunging onto 4" step x 10            Side step x 2 Rt             Sitting:              Sit to stand on mat raised position x 15   10/18/21 Alternating march 2x 10 bilateral  Standing hip abduction 2# 3x 10 bilateral Standing hip extension 2# 2 x 10  Step up 4 inch 2x 10 bilateral  Gait with SPC 450 feet STS with black foam 2 x 10    10/13/21 HR/TR x 20  Alternating march 2x 10  Step up 4 inch 2x 10 bilateral  Standing hip abduction 2# 2x 10 bilateral Gait  with SPC 226 feet STS with black foam 1x 10      PATIENT EDUCATION:  Education details: 7/31 probable DOMS 09/22/21 - evaluation findings, POC, PT scope of practice, education on edema management and DVT risk, HEP Person educated: Patient Education method: Explanation, Demonstration, and Handouts Education comprehension: verbalized understanding   HOME EXERCISE PROGRAM: Access Code: [email or web address] URL: [email or web address]  10/13/21- Sit to Stand with Arms Crossed  - 1 x daily - 7 x weekly - 2 sets - 10 reps  Date: 09/22/2021 - Seated  March  - 2 x daily - 7 x weekly - 2 sets - 10 reps - Seated Isometric Hip Adduction with Ball  - 2 x daily - 7 x weekly - 2 sets - 10 reps - Seated Single Leg Hip Abduction  - 2 x daily - 7 x weekly - 2 sets - 10 reps 09/24/21 add red theraband - Seated Heel Toe Raises  - 3-5 x daily - 7 x weekly - 2 sets - 10 reps  ASSESSMENT:  CLINICAL IMPRESSION: Patient demonstrates improving functional strength with ability to transition to 6 inch steps today. She continues to  require UE support due to weakness. She demonstrates improving gait mechanics with SPC but antalgic with fatigue. Continued with functional strengthening and glute strength which is tolerated well. Patient will continue to benefit from physical therapy in order to improve function and reduce impairment.     OBJECTIVE IMPAIRMENTS Abnormal gait, decreased activity tolerance, decreased balance, decreased endurance, decreased mobility, difficulty walking, decreased strength, increased edema, impaired UE functional use, postural dysfunction, and pain.   ACTIVITY LIMITATIONS carrying, lifting, bending, standing, squatting, stairs, bathing, toileting, locomotion level, and caring for others  PARTICIPATION LIMITATIONS: meal prep, cleaning, laundry, driving, community activity, and yard work  PERSONAL FACTORS Age, Transportation, and 3+ comorbidities: DM, L UE trauma, L knee arthritis/pain chronic  are also affecting patient's functional outcome.   REHAB POTENTIAL: Good  CLINICAL DECISION MAKING: Evolving/moderate complexity  EVALUATION COMPLEXITY: Moderate   GOALS: Goals reviewed with patient? Yes  SHORT TERM GOALS: Target date: 10/20/2021  Patient will be independent with initial HEP and self-management strategies to improve functional outcomes   Baseline: 09/22/21 - initiated today Goal status: MET  2.  Patient will be able to demonstrate at least 4-/5 for gross L LE strength to improve functional support on L LE and progress function Baseline: 09/22/21 - see objective measures, L hip 2+/5 for hip flexion 8/8 MET see above Goal status: MET  3.  Patient will be able to demonstrate ability to ambulate with equal step length and no pain in order to progress L LE functional strength and Wbing.  Baseline: 09/22/21 - decreased stride length left 10/26/21 decreased R step length, decreased L hip extension, slight antalgic on L 8/8 not equal and continued symptoms Goal status: IN PROGRESS   LONG TERM  GOALS: Target date: 11/17/21   Patient will be independent with advanced HEP and self-management strategies to improve functional outcomes   Baseline: 09/22/21 - to be established Goal status: IN PROGRESS  2.  Patient will demonstrate improved functional abilities by improving FOTO by at least 15 points to 47 or higher.    Baseline: 09/22/21 - current 33  10/26/21 41% function Goal status: IN PROGRESS  3.  Patient will be able to ambulate without AD independently for at least 250 feet during 2 minute walk test in order to access community distance.  Baseline: 09/22/21 - 90 feet 10/26/21 200 feet with SPC Goal status: IN PROGRESS    PLAN: PT FREQUENCY: 2x/week  PT DURATION: 8 weeks  PLANNED INTERVENTIONS: Therapeutic exercises, Therapeutic activity, Neuromuscular re-education, Balance training, Gait training, Patient/Family education, Joint mobilization, Stair training, Spinal mobilization, Cryotherapy, Moist heat, scar mobilization, Taping, Manual therapy, and Re-evaluation  PLAN FOR NEXT SESSION:  Continue to progress Lt hip stabilization and slow increased L LE WB strengthening.      1:40 PM, 11/02/21 Mearl Latin PT, DPT Physical Therapist at [facility]

## 2021-11-04 ENCOUNTER — Encounter (HOSPITAL_COMMUNITY): Payer: Self-pay

## 2021-11-04 ENCOUNTER — Encounter (HOSPITAL_COMMUNITY): Payer: Self-pay | Admitting: Physical Therapy

## 2021-11-04 ENCOUNTER — Ambulatory Visit (HOSPITAL_COMMUNITY): Payer: Medicare Other

## 2021-11-04 ENCOUNTER — Ambulatory Visit (HOSPITAL_COMMUNITY): Payer: Medicare Other | Admitting: Physical Therapy

## 2021-11-04 DIAGNOSIS — M25532 Pain in left wrist: Secondary | ICD-10-CM | POA: Diagnosis not present

## 2021-11-04 DIAGNOSIS — R29898 Other symptoms and signs involving the musculoskeletal system: Secondary | ICD-10-CM | POA: Diagnosis not present

## 2021-11-04 DIAGNOSIS — R262 Difficulty in walking, not elsewhere classified: Secondary | ICD-10-CM

## 2021-11-04 DIAGNOSIS — M25652 Stiffness of left hip, not elsewhere classified: Secondary | ICD-10-CM

## 2021-11-04 DIAGNOSIS — M25632 Stiffness of left wrist, not elsewhere classified: Secondary | ICD-10-CM | POA: Diagnosis not present

## 2021-11-04 DIAGNOSIS — M6281 Muscle weakness (generalized): Secondary | ICD-10-CM

## 2021-11-04 DIAGNOSIS — M25552 Pain in left hip: Secondary | ICD-10-CM | POA: Diagnosis not present

## 2021-11-04 NOTE — Therapy (Signed)
OUTPATIENT OCCUPATIONAL THERAPY TREATMENT NOTE   Patient Name: Jennifer Porter MRN: [id number] DOB:04-20-1945, 76 y.o., female Today's Date: 11/04/2021   PCP: Dr. Celene Squibb REFERRING PROVIDER: Lauraine Rinne, PA-C (surgeon is Dr. Katha Hamming)   OT End of Session - 11/04/21 1304     Visit Number 13    Number of Visits 16    Date for OT Re-Evaluation 11/21/21    Authorization Type 1) BCBS 2) [employer] Medicare; $30 copay    Progress Note Due on Visit 10    OT Start Time 1301    OT Stop Time 1341    OT Time Calculation (min) 40 min    Activity Tolerance Patient tolerated treatment well    Behavior During Therapy WFL for tasks assessed/performed              Past Medical History:  Diagnosis Date   Allergy    seasonal   Asthma    Chronic pancreatitis ([place])    Complication of anesthesia    Diabetes mellitus without complication ([place])    GERD (gastroesophageal reflux disease)    hiatal hernia   H/O: gout    Hyperlipidemia    Hypertension    Pancreatitis    PONV (postoperative nausea and vomiting)    Past Surgical History:  Procedure Laterality Date   ABDOMINAL HYSTERECTOMY     APPENDECTOMY     Arthroscopic Left Knee Left 03/22/2003   CHOLECYSTECTOMY     in [place], 2010, without polyps   ESOPHAGOGASTRODUODENOSCOPY (EGD) WITH PROPOFOL N/A 11/08/2018   Procedure: ESOPHAGOGASTRODUODENOSCOPY (EGD) WITH PROPOFOL;  Surgeon: [name], MD;  [facility];  Service: Endoscopy;  Laterality: N/A;   EUS N/A 11/08/2018   Procedure: UPPER ENDOSCOPIC ULTRASOUND (EUS) RADIAL;  Surgeon: [name], MD;  Location: [facility];  Service: Endoscopy;  Laterality: N/A;   INTRAMEDULLARY (IM) NAIL INTERTROCHANTERIC Left 08/27/2021   Procedure: INTRAMEDULLARY (IM) NAIL INTERTROCHANTRIC;  Surgeon: Shona Needles, MD;  Location: [facility];  Service: Orthopedics;  Laterality: Left;   ORIF WRIST FRACTURE Left 08/27/2021   Procedure: OPEN REDUCTION  INTERNAL FIXATION (ORIF) WRIST FRACTURE;  Surgeon: Shona Needles, MD;  Location: [facility];  Service: Orthopedics;  Laterality: Left;   Patient Active Problem List   Diagnosis Date Noted   Pressure injury of skin 09/12/2021   Trauma 09/01/2021   Subdural hematoma (H[facility])    Subarachnoid hemorrhage (H[facility])    Acute blood loss anemia    Multiple injuries due to trauma 08/26/2021   Colon cancer screening 07/29/2021   Hyperlipidemia associated with type 2 diabetes mellitus ([place]) 11/28/2020   Acute recurrent pansinusitis 07/20/2020   Urinary retention 01/15/2020   Urinary urgency 01/15/2020   Abdominal cramping 08/21/2019   Protein-calorie malnutrition ([place]) 02/18/2019   Chronic pancreatitis ([place]) 08/24/2018   Loose stools 08/24/2018   Pancreatic calcification 08/22/2018   Acute pancreatitis without infection or necrosis 08/09/2018   Sigmoid diverticulosis 08/09/2018   3-vessel CAD 08/09/2018   Aortic atherosclerosis ([place]) 08/09/2018   Myelolipoma of right adrenal gland 08/09/2018   Renal cyst, right 08/09/2018   Hormone replacement therapy 01/12/2017   Hot flashes due to menopause 01/12/2017   Hypomagnesemia 06/27/2016   Diarrhea 03/28/2016   Vitamin D deficiency 10/03/2014   Left knee pain 07/02/2014   Hypertension    Gastroesophageal reflux disease    Type 2 diabetes mellitus without complication ([place])    Allergy    Asthma  H/O: gout    Hyperlipidemia     ONSET DATE: 08/26/21  REFERRING DIAG: Trauma-s/p ORIF of left distal radius fracture  THERAPY DIAG:  Stiffness of left wrist, not elsewhere classified  Other symptoms and signs involving the musculoskeletal system  Pain in left wrist  Rationale for Evaluation and Treatment Rehabilitation  PERTINENT HISTORY: Pt is a 76 y/o female s/p left wrist ORIF on 08/27/21 after falling backwards and landing on her left side on 08/26/21. Pt also with left femur fx and SAH/SDH. Pt presents with platform walker, pre-fabricated wrist  splint, elbow pad. During evaluation, staff calling surgeon's office as no clearance for wrist mobility yet, surgeon's office stating pt clear for OT and PT on LUE and LLE, sending additional referral.   PRECAUTIONS: Other: Per surgeon's office: no protocol, NWB through left wrist. Progress as tolerated.    SUBJECTIVE: S: "It hurts when I use it a lot. It just takes time, I know that."  PAIN:  Are you having pain? No   OBJECTIVE:     FUNCTIONAL OUTCOME MEASURES: Quick Dash: 75 8/3: 38.64   UPPER EXTREMITY ROM                  Assessed seated, gravity eliminated with exception of wrist extension-against gravity    Active ROM Left eval Left 8/3  Wrist flexion 32 59  Wrist extension 24 35  Wrist ulnar deviation 12 12  Wrist radial deviation 18 21  Wrist pronation 90 90  Wrist supination 75 87  (Blank rows = not tested)     UPPER EXTREMITY MMT:                  Not tested due to pain   MMT Left eval Left  8/3  Elbow flexion   4/5  Elbow extension   5/5  Wrist flexion   4/5  Wrist extension   4-/5  Wrist ulnar deviation   4+/5  Wrist radial deviation   4+/5  Wrist pronation   4/5  Wrist supination   4-/5  (Blank rows = not tested)   HAND FUNCTION: Grip strength: Right: 40 lbs; Left: 0 lbs, Lateral pinch: Right: 12 lbs, Left: 0 lbs, and 3 point pinch: Right: 10 lbs, Left: 0 lbs  8/3 Left Lateral: 6lbs; 3 point: 6lbs Grip Strength: 16 lbs   COORDINATION: 9 Hole Peg test: Right: 19.0" sec; Left: 39.0" sec Left: 29.75"      GOALS: Goals reviewed with patient? Yes   SHORT TERM GOALS: Target date: 10/20/2021     Pt will be provided with and educated on HEP to improve mobility of left wrist required for use during ADLs.    Goal status: MET    2.  Pt will increase left wrist A/ROM to 35 degrees or greater to improve ability to perform dressing and bathing tasks using LUE as non-dominant.    Goal status: MET   3.  Pt will increase left wrist strength to 3/5  to improve ability to use LUE as assist when completing simple meal preparation tasks.    Goal status: MET   4.  Pt will increase left grip strength to 15# and pinch strength to 4# to improve ability to hold and use tools for grooming such as brushes, hair dryers, etc.    Goal status: MET     LONG TERM GOALS: Target date: 11/17/2021     Pt will decrease pain in LUE to 3/10 or less to improve ability  to use as non-dominant during daily task completion.    Goal status: MET   2.  Pt will increase left wrist A/ROM to 50 degrees or greater to improve ability to use LUE to complete laundry tasks.    Goal status: ongoing   3.  Pt will increase left wrist strength to 4/5 or greater to improve ability to complete heavy housework tasks such as vacuuming.    Goal status: ongoing   4.  Pt will increase left grip strength to 25# and pinch strength to 8# or greater to improve ability to maintain hold on pots and pans during meal preparation.   Goal status: ongoing   TODAY'S TREATMENT:   11/04/21 -Digit isometrics: 3x10" abduction and adduction, D2&3, D3&4, D4&5 -Digit isometrics: 2x10" flexion and extension at each digit  -Passive Stretching: flexion, extension, radial/ulnar deviation -Wrist strengthening: 2# 2x10 flexion and extension with wrist in neutral resting off of table, 2x10 pronation/supination, 2x10 radial/ulnar deviation  -Pinch Strength: green clothespin used to grasp 1x 5 foam cubes, Red clothespin 1x10 foam cubes due to fatigue after green clothespin -Coordination: grooved pegs, D1/2 pinch to retrieve and place grooved pegs  11/02/21 -Passive Stretching: flexion, extension, radial/ulnar deviation -Wrist strengthening: 2# 2x10 flexion and extension with wrist in neutral resting off of table, 2x10 pronation/supination, 2x10 radial/ulnar deviation -Coordination: D1/5 pinch to retrieve and place 16 small manipulatives, x2 sets -Finger strengthening: yellow rubber band, abduction  x15 reps -grip strength: using a PVC Pipe with a full grip to push down into red theraputty, 1x20 reps -Pinch Strength: green clothespin used to grasp 1x 5 foam cubes, Red clothespin 1x5  foam cubes due to fatigue after green clothespin   10/26/21 -Wrist strengthening: 2# 2x10 flexion and extension with wrist in neutral resting off of table, 2x10 pronation/supination, 2x10 radial/ulnar deviation -Rubber Band finger extension, 2x15 -Radial/ulnar deviation to manipulate nuts and bolts, 1x7, with D1/5 -Coordination: Translation to retrieve and place small pegs on pegboard, 1x15 -Grip Strength: 2x15, 3 lbs -Pinch Strength: red clothespin used to grasp 2x 6 foam cubes, maintained contraction throughout  -Manual therapy: soft tissue mobilization completed to flexors and extensors and hand musculature to decrease pain and fascial restrictions -Scar tissue mobilization    PATIENT EDUCATION: Education details: Digit isometrics Person educated: Patient Education method: Customer service manager Education comprehension: verbalized understanding   HOME EXERCISE PROGRAM Eval: continue CIR finger ROM, washcloth squeeze 09/28/21: Tendon glides and wrist A/ROM 7/13: Theraputty, opposition  8/3: Scar Massage and desensitization  8/17: Digit isometrics     ASSESSMENT:   CLINICAL IMPRESSION: A: Start of session focusing on strengthening intrinsic musculature of the hand and forearm without resistance at the thumb with use of isometric. Added to HEP. Pt reporting fatigue and increased tightness following exercises. Addressed with manual therapy and pt noting relief. Therapist providing verbal cues and demonstration for pinch pattern with use of thumb to decrease discomfort.     PLAN:   OT FREQUENCY: 2x/week  OT DURATION: 8 weeks  PLANNED INTERVENTIONS:  self care/ADL training, therapeutic exercise, therapeutic activity, manual therapy, passive range of motion, splinting, electrical  stimulation, ultrasound, moist heat, cryotherapy, patient/family education, and DME and/or AE instructions  CONSULTED AND AGREED WITH PLAN OF CARE: Patient  PLAN FOR NEXT SESSION: P: Reassess for discharge     Mathews Robinsons, OTR/L [phone]  11/04/2021, 1:44 PM

## 2021-11-04 NOTE — Therapy (Signed)
OUTPATIENT PHYSICAL THERAPY TREATMENT   Patient Name: Jennifer Porter MRN: [id number] DOB:[birth year], 76 y.o., female Today's Date: 11/04/2021  Progress Note   Reporting Period 09/22/21 to 10/26/21   See note below for Objective Data and Assessment of Progress/Goals    PT End of Session - 11/04/21 1349     Visit Number 12    Number of Visits 16    Date for PT Re-Evaluation 11/23/21    Authorization Type BCBS state - ded met; 30 copay; no coins; no auth needed    Progress Note Due on Visit 20    PT Start Time 1347    PT Stop Time 1427    PT Time Calculation (min) 40 min    Equipment Utilized During Treatment Gait belt    Activity Tolerance Patient tolerated treatment well;Patient limited by fatigue    Behavior During Therapy WFL for tasks assessed/performed                Past Medical History:  Diagnosis Date   Allergy    seasonal   Asthma    Chronic pancreatitis ([place])    Complication of anesthesia    Diabetes mellitus without complication ([place])    GERD (gastroesophageal reflux disease)    hiatal hernia   H/O: gout    Hyperlipidemia    Hypertension    Pancreatitis    PONV (postoperative nausea and vomiting)    Past Surgical History:  Procedure Laterality Date   ABDOMINAL HYSTERECTOMY     APPENDECTOMY     Arthroscopic Left Knee Left 03/22/2003   CHOLECYSTECTOMY     in [place], 2010, without polyps   ESOPHAGOGASTRODUODENOSCOPY (EGD) WITH PROPOFOL N/A 11/08/2018   Procedure: ESOPHAGOGASTRODUODENOSCOPY (EGD) WITH PROPOFOL;  Surgeon: [name], MD;  [facility];  Service: Endoscopy;  Laterality: N/A;   EUS N/A 11/08/2018   Procedure: UPPER ENDOSCOPIC ULTRASOUND (EUS) RADIAL;  Surgeon: [name], MD;  [facility];  Service: Endoscopy;  Laterality: N/A;   INTRAMEDULLARY (IM) NAIL INTERTROCHANTERIC Left 08/27/2021   Procedure: INTRAMEDULLARY (IM) NAIL INTERTROCHANTRIC;  Surgeon: Shona Needles, MD;   Location: [facility];  Service: Orthopedics;  Laterality: Left;   ORIF WRIST FRACTURE Left 08/27/2021   Procedure: OPEN REDUCTION INTERNAL FIXATION (ORIF) WRIST FRACTURE;  Surgeon: Shona Needles, MD;  Location: [facility];  Service: Orthopedics;  Laterality: Left;   Patient Active Problem List   Diagnosis Date Noted   Pressure injury of skin 09/12/2021   Trauma 09/01/2021   Subdural hematoma ([facility])    Subarachnoid hemorrhage ([facility])    Acute blood loss anemia    Multiple injuries due to trauma 08/26/2021   Colon cancer screening 07/29/2021   Hyperlipidemia associated with type 2 diabetes mellitus ([place]) 11/28/2020   Acute recurrent pansinusitis 07/20/2020   Urinary retention 01/15/2020   Urinary urgency 01/15/2020   Abdominal cramping 08/21/2019   Protein-calorie malnutrition ([place]) 02/18/2019   Chronic pancreatitis ([place]) 08/24/2018   Loose stools 08/24/2018   Pancreatic calcification 08/22/2018   Acute pancreatitis without infection or necrosis 08/09/2018   Sigmoid diverticulosis 08/09/2018   3-vessel CAD 08/09/2018   Aortic atherosclerosis ([place]) 08/09/2018   Myelolipoma of right adrenal gland 08/09/2018   Renal cyst, right 08/09/2018   Hormone replacement therapy 01/12/2017   Hot flashes due to menopause 01/12/2017   Hypomagnesemia 06/27/2016   Diarrhea 03/28/2016   Vitamin D deficiency 10/03/2014   Left knee pain 07/02/2014   Hypertension  Gastroesophageal reflux disease    Type 2 diabetes mellitus without complication ([facility])    Allergy    Asthma    H/O: gout    Hyperlipidemia     PCP: Celene Squibb, MD  REFERRING PROVIDER: Cathlyn Parsons, PA-C   and verbal from surgeon Haddix, Thomasene Lot, MD  REFERRING DIAG: Trauma   THERAPY DIAG:  Pain in left hip  Difficulty in walking, not elsewhere classified  Stiffness of left hip, not elsewhere classified  Muscle weakness (generalized)  Rationale for Evaluation and Treatment Rehabilitation  ONSET DATE: 08/26/2021 trauma,  08/27/2021 surgery L hip ORIF  SUBJECTIVE:   SUBJECTIVE STATEMENT : Patient states her leg hurts when she turns on it certain ways. Has been walking inside some but not much outside.   PERTINENT HISTORY: L hip ORIF surgery 08/27/21 L wrist ORIF sugery 08/27/21 L knee arthritis  DM - denies N/T into feet currently, reports intermittently does get these symptoms present before trauma Hx of back fracture - no current back symptoms  Chronic pancreatitis, gallbladder removed, multiple comorbidities  PAIN:  Are you having pain? Yes: NPRS scale: 0/10 Pain location: L hip  Pain description: sore Aggravating factors: movement Relieving factors: rest    PRECAUTIONS: Fall  WEIGHT BEARING RESTRICTIONS  WBAT L LE - (of note, NWB to L UE being treated by OT but please advise for PT treatments)  FALLS:  Has patient fallen in last 6 months? Yes. Number of falls 1 this current trauma  LIVING ENVIRONMENT: Lives with: lives with their spouse Lives in: House/apartment Stairs: Yes: External: 1 steps; modified with a ramp currently Has following equipment at home: Walker - 2 wheeled and elevated toilet commode seat over regular toilet  OCCUPATION: retired Education officer, [employer]; very active around the house and with husband  PLOF: Independent  PATIENT GOALS "get back to be able to do what I usually do"   OBJECTIVE:   DIAGNOSTIC FINDINGS: Quoted from MD note upon hospital discharge  ''DG Branch 2-3 VIEWS LEFT  Result Date: 09/15/2021 CLINICAL DATA:  Left hip surgery 3 weeks ago EXAM: DG HIP (WITH OR WITHOUT PELVIS) 2-3V LEFT COMPARISON:  08/27/2021 FINDINGS: Postsurgical changes from left femur ORIF of intertrochanteric left femur fracture. Alignment is unchanged compared to the immediate postoperative images including mildly displaced lesser trochanteric fragment. No new fractures. Hip joint intact without dislocation. Previously seen soft tissue air has resolved. IMPRESSION: Satisfactory  appearance of the left hip following recent ORIF. Electronically Signed   By: Davina Poke D.O.   On: 09/15/2021 15:46 "  PATIENT SURVEYS:  FOTO 33  10/26/21 41% function  EDEMA:  Circumferential: ankle circ L 24 cm verse R 23 cm with visibule edema at left foot  POSTURE: forward head, flexed trunk , and weight shift right  PALPATION: TTP at gross left hip into quad, left knee with light touch  LOWER EXTREMITY ROM:  Active ROM Right eval Left eval  Knee flexion WNL WNL  Knee extension WNL WNL  Ankle dorsiflexion WNL WNL  Ankle plantarflexion WNL WNL   (Blank rows = not tested)  LOWER EXTREMITY MMT: eval: modified all testing in sitting secondary to gross trauma/post op comfort 10/26/21:: able to tes tL  hip abduction in partial R sidelying and sitting  MMT Right eval Left eval Right  10/26/21 Left 10/26/21  Hip flexion 4+ 2+ 4+ 4  Hip extension 4     Hip abduction 4+ 2+ 4+ 4  Hip adduction 4+  3    Knee flexion 4+ 3 4+ 4  Knee extension 4+ 3 4+ 4  Ankle dorsiflexion 4+ 3 4+ 4+  Ankle plantarflexion 4+ 3     (Blank rows = not tested)  LOWER EXTREMITY SPECIAL TESTS:  Hip special tests: NA secondary to post op   FUNCTIONAL TESTS:  2 minute walk test: 90 feet with notes in below section  GAIT: Distance walked: 90 feet Assistive device utilized: Walker - 2 wheeled and platform for L UE Level of assistance: SBA Comments: Decreased stride length and decreased push off on L LE, small step through pattern limited flexion use  Reassessment 10/26/21 2MWT 200 feet with SPC MMT FOTO  TODAY'S TREATMENT: 11/04/21 Ambulation with SPC in parking lot negotiating curbs, ramps and uneven terrain - 15 minutes Step up and over 6 inch 2x 10 with LLE STS 1 x 10 with black foam   11/02/21 HR x 15 TR x 15 Step up 6 inch 2 x 10 Lateral step up 6 inch 2x 10 Gait with SPC 450 feet with SPC Standing hip abduction RTB 2x 10 bilateral  Standing hip extension RTB 2x 10 bilateral   Forward step down 4 inch 1 x 10 LLE Nu step seat 5 level 5 5 minutes    10/26/21 Standing March 2x 10  HR x 15 TR x 15 Reassessment Step up 4 inch 1x 15 bilateral Lateral step up 4 inch 1x 10 bilateral  Forward step down 1x 10 LLE  10/21/21            Gt with cane x 226 ft             Heel raise x 15            Functional squat x 10             Weight shifting reaching for the apple to the rt and LT x 10 each            Stomp to the right and then to the left 5 x each             Marching x 20            Step up 4" step x 15            Step down from 4" step x 5             Lunging onto 4" step x 10            Side step x 2 Rt             Sitting:              Sit to stand on mat raised position x 15   10/18/21 Alternating march 2x 10 bilateral  Standing hip abduction 2# 3x 10 bilateral Standing hip extension 2# 2 x 10  Step up 4 inch 2x 10 bilateral  Gait with SPC 450 feet STS with black foam 2 x 10    10/13/21 HR/TR x 20  Alternating march 2x 10  Step up 4 inch 2x 10 bilateral  Standing hip abduction 2# 2x 10 bilateral Gait  with SPC 226 feet STS with black foam 1x 10      PATIENT EDUCATION:  Education details: 7/31 probable DOMS 09/22/21 - evaluation findings, POC, PT scope of practice, education on edema management and DVT risk, HEP Person educated: Patient Education method: Explanation, Demonstration, and Handouts Education comprehension: verbalized  understanding   HOME EXERCISE PROGRAM: Access Code: [email or web address] URL: [email or web address]  10/13/21- Sit to Stand with Arms Crossed  - 1 x daily - 7 x weekly - 2 sets - 10 reps  Date: 09/22/2021 - Seated March  - 2 x daily - 7 x weekly - 2 sets - 10 reps - Seated Isometric Hip Adduction with Ball  - 2 x daily - 7 x weekly - 2 sets - 10 reps - Seated Single Leg Hip Abduction  - 2 x daily - 7 x weekly - 2 sets - 10 reps 09/24/21 add red theraband - Seated Heel Toe Raises  - 3-5 x daily - 7 x weekly -  2 sets - 10 reps  ASSESSMENT:  CLINICAL IMPRESSION: Began session with ambulation with SPC outside in parking lot navigating uneven terrain, ramps and curbs with cueing on proper SPC use. Patient demonstrating good strength and intermittent compensatory strategy  but mostly apprehension especially with curb negotiation. She demonstrates improving balance and strength with deficit still present overall. Continues to demonstrate weakness with STS requiring elevated surface to complete. Patient will continue to benefit from physical therapy in order to improve function and reduce impairment.     OBJECTIVE IMPAIRMENTS Abnormal gait, decreased activity tolerance, decreased balance, decreased endurance, decreased mobility, difficulty walking, decreased strength, increased edema, impaired UE functional use, postural dysfunction, and pain.   ACTIVITY LIMITATIONS carrying, lifting, bending, standing, squatting, stairs, bathing, toileting, locomotion level, and caring for others  PARTICIPATION LIMITATIONS: meal prep, cleaning, laundry, driving, community activity, and yard work  PERSONAL FACTORS Age, Transportation, and 3+ comorbidities: DM, L UE trauma, L knee arthritis/pain chronic  are also affecting patient's functional outcome.   REHAB POTENTIAL: Good  CLINICAL DECISION MAKING: Evolving/moderate complexity  EVALUATION COMPLEXITY: Moderate   GOALS: Goals reviewed with patient? Yes  SHORT TERM GOALS: Target date: 10/20/2021  Patient will be independent with initial HEP and self-management strategies to improve functional outcomes   Baseline: 09/22/21 - initiated today Goal status: MET  2.  Patient will be able to demonstrate at least 4-/5 for gross L LE strength to improve functional support on L LE and progress function Baseline: 09/22/21 - see objective measures, L hip 2+/5 for hip flexion 8/8 MET see above Goal status: MET  3.  Patient will be able to demonstrate ability to ambulate with  equal step length and no pain in order to progress L LE functional strength and [place].  Baseline: 09/22/21 - decreased stride length left 10/26/21 decreased R step length, decreased L hip extension, slight antalgic on L 8/8 not equal and continued symptoms Goal status: IN PROGRESS   LONG TERM GOALS: Target date: 11/17/21   Patient will be independent with advanced HEP and self-management strategies to improve functional outcomes   Baseline: 09/22/21 - to be established Goal status: IN PROGRESS  2.  Patient will demonstrate improved functional abilities by improving FOTO by at least 15 points to 47 or higher.    Baseline: 09/22/21 - current 33  10/26/21 41% function Goal status: IN PROGRESS  3.  Patient will be able to ambulate without AD independently for at least 250 feet during 2 minute walk test in order to access community distance.  Baseline: 09/22/21 - 90 feet 10/26/21 200 feet with SPC Goal status: IN PROGRESS    PLAN: PT FREQUENCY: 2x/week  PT DURATION: 8 weeks  PLANNED INTERVENTIONS: Therapeutic exercises, Therapeutic activity, Neuromuscular re-education, Balance training, Gait training, Patient/Family education, Joint mobilization, Stair training, Spinal mobilization,  Cryotherapy, Moist heat, scar mobilization, Taping, Manual therapy, and Re-evaluation  PLAN FOR NEXT SESSION:  Continue to progress Lt hip stabilization and slow increased L LE WB strengthening.      1:50 PM, 11/04/21 Mearl Latin PT, DPT Physical Therapist at [facility]

## 2021-11-08 ENCOUNTER — Telehealth: Payer: Self-pay | Admitting: *Deleted

## 2021-11-08 NOTE — Telephone Encounter (Signed)
Spoke to pt, and she informed me that she is out of Creon and she called her pharmacy and they stated that it was on back order and they were not sure when it would be in. She states she has called around to other pharmacy in the area and none of them have any. Please advise.

## 2021-11-08 NOTE — Telephone Encounter (Signed)
Can you reach out to the drug rep to see if we can get samples of Creon?

## 2021-11-09 ENCOUNTER — Ambulatory Visit (HOSPITAL_COMMUNITY): Payer: Medicare Other

## 2021-11-09 ENCOUNTER — Ambulatory Visit (HOSPITAL_COMMUNITY): Payer: Medicare Other | Admitting: Physical Therapy

## 2021-11-09 ENCOUNTER — Encounter (HOSPITAL_COMMUNITY): Payer: Self-pay | Admitting: Physical Therapy

## 2021-11-09 ENCOUNTER — Encounter (HOSPITAL_COMMUNITY): Payer: Self-pay

## 2021-11-09 DIAGNOSIS — M25532 Pain in left wrist: Secondary | ICD-10-CM | POA: Diagnosis not present

## 2021-11-09 DIAGNOSIS — M25632 Stiffness of left wrist, not elsewhere classified: Secondary | ICD-10-CM | POA: Diagnosis not present

## 2021-11-09 DIAGNOSIS — R29898 Other symptoms and signs involving the musculoskeletal system: Secondary | ICD-10-CM | POA: Diagnosis not present

## 2021-11-09 DIAGNOSIS — R262 Difficulty in walking, not elsewhere classified: Secondary | ICD-10-CM

## 2021-11-09 DIAGNOSIS — M25652 Stiffness of left hip, not elsewhere classified: Secondary | ICD-10-CM

## 2021-11-09 DIAGNOSIS — M6281 Muscle weakness (generalized): Secondary | ICD-10-CM

## 2021-11-09 DIAGNOSIS — M25552 Pain in left hip: Secondary | ICD-10-CM

## 2021-11-09 NOTE — Telephone Encounter (Signed)
Call around to pharmacies in the area and they are on back order. Not sure when they will be in. Also, called and spoke to a rep at [employer] and they don't have any either.

## 2021-11-09 NOTE — Therapy (Signed)
OUTPATIENT PHYSICAL THERAPY TREATMENT   Patient Name: Jennifer Porter MRN: [id number] DOB:1945/05/25, 76 y.o., female Today's Date: 11/09/2021  Progress Note   Reporting Period 09/22/21 to 10/26/21   See note below for Objective Data and Assessment of Progress/Goals    PT End of Session - 11/09/21 1352     Visit Number 13    Number of Visits 16    Date for PT Re-Evaluation 11/23/21    Authorization Type BCBS state - ded met; 30 copay; no coins; no auth needed    Progress Note Due on Visit 20    PT Start Time 1351    PT Stop Time 1430    PT Time Calculation (min) 39 min    Equipment Utilized During Treatment Gait belt    Activity Tolerance Patient tolerated treatment well;Patient limited by fatigue    Behavior During Therapy WFL for tasks assessed/performed                Past Medical History:  Diagnosis Date   Allergy    seasonal   Asthma    Chronic pancreatitis ([place])    Complication of anesthesia    Diabetes mellitus without complication ([place])    GERD (gastroesophageal reflux disease)    hiatal hernia   H/O: gout    Hyperlipidemia    Hypertension    Pancreatitis    PONV (postoperative nausea and vomiting)    Past Surgical History:  Procedure Laterality Date   ABDOMINAL HYSTERECTOMY     APPENDECTOMY     Arthroscopic Left Knee Left 03/22/2003   CHOLECYSTECTOMY     in [place], 2010, without polyps   ESOPHAGOGASTRODUODENOSCOPY (EGD) WITH PROPOFOL N/A 11/08/2018   Procedure: ESOPHAGOGASTRODUODENOSCOPY (EGD) WITH PROPOFOL;  Surgeon: [name], MD;  [facility]: [facility];  Service: Endoscopy;  Laterality: N/A;   EUS N/A 11/08/2018   Procedure: UPPER ENDOSCOPIC ULTRASOUND (EUS) RADIAL;  Surgeon: [name], MD;  [facility];  Service: Endoscopy;  Laterality: N/A;   INTRAMEDULLARY (IM) NAIL INTERTROCHANTERIC Left 08/27/2021   Procedure: INTRAMEDULLARY (IM) NAIL INTERTROCHANTRIC;  Surgeon: Shona Needles, MD;   Location: [facility];  Service: Orthopedics;  Laterality: Left;   ORIF WRIST FRACTURE Left 08/27/2021   Procedure: OPEN REDUCTION INTERNAL FIXATION (ORIF) WRIST FRACTURE;  Surgeon: Shona Needles, MD;  Location: [facility];  Service: Orthopedics;  Laterality: Left;   Patient Active Problem List   Diagnosis Date Noted   Pressure injury of skin 09/12/2021   Trauma 09/01/2021   Subdural hematoma (HCC)    Subarachnoid hemorrhage (HCC)    Acute blood loss anemia    Multiple injuries due to trauma 08/26/2021   Colon cancer screening 07/29/2021   Hyperlipidemia associated with type 2 diabetes mellitus ([place]) 11/28/2020   Acute recurrent pansinusitis 07/20/2020   Urinary retention 01/15/2020   Urinary urgency 01/15/2020   Abdominal cramping 08/21/2019   Protein-calorie malnutrition ([place]) 02/18/2019   Chronic pancreatitis ([place]) 08/24/2018   Loose stools 08/24/2018   Pancreatic calcification 08/22/2018   Acute pancreatitis without infection or necrosis 08/09/2018   Sigmoid diverticulosis 08/09/2018   3-vessel CAD 08/09/2018   Aortic atherosclerosis ([address]) 08/09/2018   Myelolipoma of right adrenal gland 08/09/2018   Renal cyst, right 08/09/2018   Hormone replacement therapy 01/12/2017   Hot flashes due to menopause 01/12/2017   Hypomagnesemia 06/27/2016   Diarrhea 03/28/2016   Vitamin D deficiency 10/03/2014   Left knee pain 07/02/2014   Hypertension  Gastroesophageal reflux disease    Type 2 diabetes mellitus without complication (HCC)    Allergy    Asthma    H/O: gout    Hyperlipidemia     PCP: Celene Squibb, MD  REFERRING PROVIDER: Cathlyn Parsons, PA-C   and verbal from surgeon Haddix, Thomasene Lot, MD  REFERRING DIAG: Trauma   THERAPY DIAG:  Pain in left hip  Difficulty in walking, not elsewhere classified  Stiffness of left hip, not elsewhere classified  Muscle weakness (generalized)  Other symptoms and signs involving the musculoskeletal system  Rationale for Evaluation and  Treatment Rehabilitation  ONSET DATE: 08/26/2021 trauma, 08/27/2021 surgery L hip ORIF  SUBJECTIVE:   SUBJECTIVE STATEMENT : Patient states pain sometimes. Walking with cane has been goin alright. Increased symptoms after sitting.   PERTINENT HISTORY: L hip ORIF surgery 08/27/21 L wrist ORIF sugery 08/27/21 L knee arthritis  DM - denies N/T into feet currently, reports intermittently does get these symptoms present before trauma Hx of back fracture - no current back symptoms  Chronic pancreatitis, gallbladder removed, multiple comorbidities  PAIN:  Are you having pain? Yes: NPRS scale: 0/10 Pain location: L hip  Pain description: sore Aggravating factors: movement Relieving factors: rest    PRECAUTIONS: Fall  WEIGHT BEARING RESTRICTIONS  WBAT L LE - (of note, NWB to L UE being treated by OT but please advise for PT treatments)  FALLS:  Has patient fallen in last 6 months? Yes. Number of falls 1 this current trauma  LIVING ENVIRONMENT: Lives with: lives with their spouse Lives in: House/apartment Stairs: Yes: External: 1 steps; modified with a ramp currently Has following equipment at home: Walker - 2 wheeled and elevated toilet commode seat over regular toilet  OCCUPATION: retired Education officer, [employer]; very active around the house and with husband  PLOF: Independent  PATIENT GOALS "get back to be able to do what I usually do"   OBJECTIVE:   DIAGNOSTIC FINDINGS: Quoted from MD note upon hospital discharge  ''DG [place] 2-3 VIEWS LEFT  Result Date: 09/15/2021 CLINICAL DATA:  Left hip surgery 3 weeks ago EXAM: DG HIP (WITH OR WITHOUT PELVIS) 2-3V LEFT COMPARISON:  08/27/2021 FINDINGS: Postsurgical changes from left femur ORIF of intertrochanteric left femur fracture. Alignment is unchanged compared to the immediate postoperative images including mildly displaced lesser trochanteric fragment. No new fractures. Hip joint intact without dislocation. Previously seen soft  tissue air has resolved. IMPRESSION: Satisfactory appearance of the left hip following recent ORIF. Electronically Signed   By: Davina Poke D.O.   On: 09/15/2021 15:46 "  PATIENT SURVEYS:  FOTO 33  10/26/21 41% function  EDEMA:  Circumferential: ankle circ L 24 cm verse R 23 cm with visibule edema at left foot  POSTURE: forward head, flexed trunk , and weight shift right  PALPATION: TTP at gross left hip into quad, left knee with light touch  LOWER EXTREMITY ROM:  Active ROM Right eval Left eval  Knee flexion WNL WNL  Knee extension WNL WNL  Ankle dorsiflexion WNL WNL  Ankle plantarflexion WNL WNL   (Blank rows = not tested)  LOWER EXTREMITY MMT: eval: modified all testing in sitting secondary to gross trauma/post op comfort 10/26/21:: able to tes tL  hip abduction in partial R sidelying and sitting  MMT Right eval Left eval Right  10/26/21 Left 10/26/21  Hip flexion 4+ 2+ 4+ 4  Hip extension 4     Hip abduction 4+ 2+ 4+ 4  Hip adduction 4+ 3    Knee flexion 4+ 3 4+ 4  Knee extension 4+ 3 4+ 4  Ankle dorsiflexion 4+ 3 4+ 4+  Ankle plantarflexion 4+ 3     (Blank rows = not tested)  LOWER EXTREMITY SPECIAL TESTS:  Hip special tests: NA secondary to post op   FUNCTIONAL TESTS:  2 minute walk test: 90 feet with notes in below section  GAIT: Distance walked: 90 feet Assistive device utilized: Walker - 2 wheeled and platform for L UE Level of assistance: SBA Comments: Decreased stride length and decreased push off on L LE, small step through pattern limited flexion use  Reassessment 10/26/21 2MWT 200 feet with SPC MMT FOTO  TODAY'S TREATMENT: 11/09/21 Ambulation with SPC in parking lot negotiating curbs, ramps and uneven terrain  - 10 minutes  Step up and over 6 inch 2x 10 with LLE Standing hip abduction RTB at knees 2x 10 bilateral  Nu step seat 5 level 5 5 minutes   11/04/21 Ambulation with SPC in parking lot negotiating curbs, ramps and uneven terrain - 15  minutes Step up and over 6 inch 2x 10 with LLE STS 1 x 10 with black foam   11/02/21 HR x 15 TR x 15 Step up 6 inch 2 x 10 Lateral step up 6 inch 2x 10 Gait with SPC 450 feet with SPC Standing hip abduction RTB 2x 10 bilateral  Standing hip extension RTB 2x 10 bilateral  Forward step down 4 inch 1 x 10 LLE Nu step seat 5 level 5 5 minutes    10/26/21 Standing March 2x 10  HR x 15 TR x 15 Reassessment Step up 4 inch 1x 15 bilateral Lateral step up 4 inch 1x 10 bilateral  Forward step down 1x 10 LLE  10/21/21            Gt with cane x 226 ft             Heel raise x 15            Functional squat x 10             Weight shifting reaching for the apple to the rt and LT x 10 each            Stomp to the right and then to the left 5 x each             Marching x 20            Step up 4" step x 15            Step down from 4" step x 5             Lunging onto 4" step x 10            Side step x 2 Rt             Sitting:              Sit to stand on mat raised position x 15   10/18/21 Alternating march 2x 10 bilateral  Standing hip abduction 2# 3x 10 bilateral Standing hip extension 2# 2 x 10  Step up 4 inch 2x 10 bilateral  Gait with SPC 450 feet STS with black foam 2 x 10    10/13/21 HR/TR x 20  Alternating march 2x 10  Step up 4 inch 2x 10 bilateral  Standing hip abduction 2# 2x 10 bilateral Gait  with  SPC 226 feet STS with black foam 1x 10      PATIENT EDUCATION:  Education details: 7/31 probable DOMS 09/22/21 - evaluation findings, POC, PT scope of practice, education on edema management and DVT risk, HEP Person educated: Patient Education method: Explanation, Demonstration, and Handouts Education comprehension: verbalized understanding   HOME EXERCISE PROGRAM: Access Code: [email or web address] URL: [email or web address]  10/13/21- Sit to Stand with Arms Crossed  - 1 x daily - 7 x weekly - 2 sets - 10 reps  Date: 09/22/2021 - Seated March  - 2 x daily  - 7 x weekly - 2 sets - 10 reps - Seated Isometric Hip Adduction with Ball  - 2 x daily - 7 x weekly - 2 sets - 10 reps - Seated Single Leg Hip Abduction  - 2 x daily - 7 x weekly - 2 sets - 10 reps 09/24/21 add red theraband - Seated Heel Toe Raises  - 3-5 x daily - 7 x weekly - 2 sets - 10 reps  ASSESSMENT:  CLINICAL IMPRESSION: Patient with increased symptoms compared to prior session. Continued with ambulation over unsteady surfaces and navigating curbs outside front of clinic. Continued with functional strengthening. Ended session with nu step for endurance and LE strength as patient was having increasing symptoms with standing today. Patient will continue to benefit from physical therapy in order to improve function and reduce impairment.     OBJECTIVE IMPAIRMENTS Abnormal gait, decreased activity tolerance, decreased balance, decreased endurance, decreased mobility, difficulty walking, decreased strength, increased edema, impaired UE functional use, postural dysfunction, and pain.   ACTIVITY LIMITATIONS carrying, lifting, bending, standing, squatting, stairs, bathing, toileting, locomotion level, and caring for others  PARTICIPATION LIMITATIONS: meal prep, cleaning, laundry, driving, community activity, and yard work  PERSONAL FACTORS Age, Transportation, and 3+ comorbidities: DM, L UE trauma, L knee arthritis/pain chronic  are also affecting patient's functional outcome.   REHAB POTENTIAL: Good  CLINICAL DECISION MAKING: Evolving/moderate complexity  EVALUATION COMPLEXITY: Moderate   GOALS: Goals reviewed with patient? Yes  SHORT TERM GOALS: Target date: 10/20/2021  Patient will be independent with initial HEP and self-management strategies to improve functional outcomes   Baseline: 09/22/21 - initiated today Goal status: MET  2.  Patient will be able to demonstrate at least 4-/5 for gross L LE strength to improve functional support on L LE and progress function Baseline:  09/22/21 - see objective measures, L hip 2+/5 for hip flexion 8/8 MET see above Goal status: MET  3.  Patient will be able to demonstrate ability to ambulate with equal step length and no pain in order to progress L LE functional strength and [place].  Baseline: 09/22/21 - decreased stride length left 10/26/21 decreased R step length, decreased L hip extension, slight antalgic on L 8/8 not equal and continued symptoms Goal status: IN PROGRESS   LONG TERM GOALS: Target date: 11/17/21   Patient will be independent with advanced HEP and self-management strategies to improve functional outcomes   Baseline: 09/22/21 - to be established Goal status: IN PROGRESS  2.  Patient will demonstrate improved functional abilities by improving FOTO by at least 15 points to 47 or higher.    Baseline: 09/22/21 - current 33  10/26/21 41% function Goal status: IN PROGRESS  3.  Patient will be able to ambulate without AD independently for at least 250 feet during 2 minute walk test in order to access community distance.  Baseline: 09/22/21 - 90 feet 10/26/21 200 feet with SPC Goal status:  IN PROGRESS    PLAN: PT FREQUENCY: 2x/week  PT DURATION: 8 weeks  PLANNED INTERVENTIONS: Therapeutic exercises, Therapeutic activity, Neuromuscular re-education, Balance training, Gait training, Patient/Family education, Joint mobilization, Stair training, Spinal mobilization, Cryotherapy, Moist heat, scar mobilization, Taping, Manual therapy, and Re-evaluation  PLAN FOR NEXT SESSION:  Continue to progress Lt hip stabilization and slow increased L LE WB strengthening.      1:53 PM, 11/09/21 Mearl Latin PT, DPT Physical Therapist at [facility]

## 2021-11-09 NOTE — Telephone Encounter (Signed)
We can give her samples of Zenpep.  This is another type of pancreatic enzyme.  We currently have samples of Zenpep 40,000 units.  She can take 2 capsules with meals and 1 with snacks.  Please provide 7 boxes.  Hopefully, this will cover her until her pharmacy has Creon in stock.  She can call back if any further questions.

## 2021-11-09 NOTE — Telephone Encounter (Signed)
Spoke to pt, informed her that samples of  Zenpep are at the front desk. Pt voiced understanding. Informed on how to take Zenpep and to call if any questions or concerns about  Zenpep.

## 2021-11-09 NOTE — Therapy (Signed)
OUTPATIENT OCCUPATIONAL THERAPY TREATMENT NOTE   Patient Name: Jennifer Porter MRN: [id number] DOB:May 08, 1945, 76 y.o., female Today's Date: 11/09/2021   PCP: Dr. Celene Squibb REFERRING PROVIDER: Lauraine Rinne, PA-C (surgeon is Dr. Lennette Bihari Haddix)  OCCUPATIONAL THERAPY DISCHARGE SUMMARY  Visits from Start of Care: 14  Current functional level related to goals / functional outcomes: Pt has met all STGs and 3 of 4 LTGs.  Pt reports some remaining strength deficits but overall feels that she is able to complete all ADLs and IADLs without significant limitation.   Remaining deficits: Pt continues with some decreased pinch and grip strength   Education / Equipment: HEP established with theraputty  Plan: Patient agrees to discharge. Patient is being discharged due to meeting the stated rehab goals.        OT End of Session - 11/09/21 1426     Visit Number 14    Number of Visits 16    Date for OT Re-Evaluation 11/21/21    Authorization Type 1) BCBS 2) [employer] Medicare; $30 copay    Progress Note Due on Visit 10    OT Start Time 1301    OT Stop Time 1343    OT Time Calculation (min) 42 min    Activity Tolerance Patient tolerated treatment well    Behavior During Therapy WFL for tasks assessed/performed               Past Medical History:  Diagnosis Date   Allergy    seasonal   Asthma    Chronic pancreatitis ([place])    Complication of anesthesia    Diabetes mellitus without complication ([place])    GERD (gastroesophageal reflux disease)    hiatal hernia   H/O: gout    Hyperlipidemia    Hypertension    Pancreatitis    PONV (postoperative nausea and vomiting)    Past Surgical History:  Procedure Laterality Date   ABDOMINAL HYSTERECTOMY     APPENDECTOMY     Arthroscopic Left Knee Left 03/22/2003   CHOLECYSTECTOMY     in [place], 2010, without polyps   ESOPHAGOGASTRODUODENOSCOPY (EGD) WITH PROPOFOL N/A 11/08/2018   Procedure:  ESOPHAGOGASTRODUODENOSCOPY (EGD) WITH PROPOFOL;  Surgeon: [name], MD;  [facility];  Service: Endoscopy;  Laterality: N/A;   EUS N/A 11/08/2018   Procedure: UPPER ENDOSCOPIC ULTRASOUND (EUS) RADIAL;  Surgeon: [name], MD;  [facility];  Service: Endoscopy;  Laterality: N/A;   INTRAMEDULLARY (IM) NAIL INTERTROCHANTERIC Left 08/27/2021   Procedure: INTRAMEDULLARY (IM) NAIL INTERTROCHANTRIC;  Surgeon: Shona Needles, MD;  Location: [facility];  Service: Orthopedics;  Laterality: Left;   ORIF WRIST FRACTURE Left 08/27/2021   Procedure: OPEN REDUCTION INTERNAL FIXATION (ORIF) WRIST FRACTURE;  Surgeon: Shona Needles, MD;  Location: [facility];  Service: Orthopedics;  Laterality: Left;   Patient Active Problem List   Diagnosis Date Noted   Pressure injury of skin 09/12/2021   Trauma 09/01/2021   Subdural hematoma ([facility])    Subarachnoid hemorrhage ([facility])    Acute blood loss anemia    Multiple injuries due to trauma 08/26/2021   Colon cancer screening 07/29/2021   Hyperlipidemia associated with type 2 diabetes mellitus ([place]) 11/28/2020   Acute recurrent pansinusitis 07/20/2020   Urinary retention 01/15/2020   Urinary urgency 01/15/2020   Abdominal cramping 08/21/2019   Protein-calorie malnutrition ([place]) 02/18/2019   Chronic pancreatitis ([place]) 08/24/2018   Loose stools 08/24/2018   Pancreatic calcification 08/22/2018  Acute pancreatitis without infection or necrosis 08/09/2018   Sigmoid diverticulosis 08/09/2018   3-vessel CAD 08/09/2018   Aortic atherosclerosis ([place]) 08/09/2018   Myelolipoma of right adrenal gland 08/09/2018   Renal cyst, right 08/09/2018   Hormone replacement therapy 01/12/2017   Hot flashes due to menopause 01/12/2017   Hypomagnesemia 06/27/2016   Diarrhea 03/28/2016   Vitamin D deficiency 10/03/2014   Left knee pain 07/02/2014   Hypertension    Gastroesophageal reflux disease    Type 2 diabetes mellitus without complication ([place])     Allergy    Asthma    H/O: gout    Hyperlipidemia     ONSET DATE: 08/26/21  REFERRING DIAG: Trauma-s/p ORIF of left distal radius fracture  THERAPY DIAG:  Stiffness of left wrist, not elsewhere classified  Other symptoms and signs involving the musculoskeletal system  Pain in left wrist  Rationale for Evaluation and Treatment Rehabilitation  PERTINENT HISTORY: Pt is a 76 y/o female s/p left wrist ORIF on 08/27/21 after falling backwards and landing on her left side on 08/26/21. Pt also with left femur fx and SAH/SDH. Pt presents with platform walker, pre-fabricated wrist splint, elbow pad. During evaluation, staff calling surgeon's office as no clearance for wrist mobility yet, surgeon's office stating pt clear for OT and PT on LUE and LLE, sending additional referral.   PRECAUTIONS: Other: Per surgeon's office: no protocol, NWB through left wrist. Progress as tolerated.    SUBJECTIVE: S: "I can turn the facet better, I've been carrying more things with it."  PAIN:  Are you having pain? No   OBJECTIVE:     FUNCTIONAL OUTCOME MEASURES: Quick Dash: 75 8/3: 38.64 8/22: 9.09   UPPER EXTREMITY ROM                  Assessed seated, gravity eliminated with exception of wrist extension-against gravity    Active ROM Left eval Left 8/3 Left  8/22  Wrist flexion 32 59 56  Wrist extension 24 35 51  Wrist ulnar deviation _0 Wrist radial deviation _1 Wrist pronation 90 90 90  Wrist supination 75 87 90  (Blank rows = not tested)     UPPER EXTREMITY MMT:                  Not tested due to pain   MMT Left eval Left  8/3 Left 8/22  Elbow flexion   4/5 5/5  Elbow extension   5/5 5/5  Wrist flexion   4/5 5/5  Wrist extension   4-/5 5/5  Wrist ulnar deviation   4+/5 5/5  Wrist radial deviation   4+/5 5/5  Wrist pronation   4/5 5/5  Wrist supination   4-/5 4+/5  (Blank rows = not tested)   HAND FUNCTION: Grip strength: Right: 40 lbs; Left: 0 lbs, Lateral  pinch: Right: 12 lbs, Left: 0 lbs, and 3 point pinch: Right: 10 lbs, Left: 0 lbs  8/3 Left Lateral: 6lbs; 3 point: 6lbs Grip Strength: 16 lbs  8/22 Left  Lateral: 6 lbs; 3 point: 8 lbs Grip Strength: 15 lbs   COORDINATION: 9 Hole Peg test: Right: 19.0" sec; Left: 39.0" sec Left: 29.75"  8/22 Left: 27.52"      GOALS: Goals reviewed with patient? Yes   SHORT TERM GOALS: Target date: 10/20/2021     Pt will be provided with and educated on HEP to improve mobility of left wrist required for use during ADLs.  Goal status: MET    2.  Pt will increase left wrist A/ROM to 35 degrees or greater to improve ability to perform dressing and bathing tasks using LUE as non-dominant.    Goal status: MET   3.  Pt will increase left wrist strength to 3/5 to improve ability to use LUE as assist when completing simple meal preparation tasks.    Goal status: MET   4.  Pt will increase left grip strength to 15# and pinch strength to 4# to improve ability to hold and use tools for grooming such as brushes, hair dryers, etc.    Goal status: MET     LONG TERM GOALS: Target date: 11/17/2021     Pt will decrease pain in LUE to 3/10 or less to improve ability to use as non-dominant during daily task completion.    Goal status: MET   2.  Pt will increase left wrist A/ROM to 50 degrees or greater to improve ability to use LUE to complete laundry tasks.    Goal status: MET    3.  Pt will increase left wrist strength to 4/5 or greater to improve ability to complete heavy housework tasks such as vacuuming.    Goal status: MET    4.  Pt will increase left grip strength to 25# and pinch strength to 8# or greater to improve ability to maintain hold on pots and pans during meal preparation.   Goal status: PARTIALLY MET    TODAY'S TREATMENT:   11/09/21 -Digit isometrics: 3x10" abduction and adduction, D2&3, D3&4, D4&5 -Digit isometrics: 2x10" flexion and extension at each digit  -Passive  Stretching: flexion, extension, radial/ulnar deviation, 3x10" -Wrist strengthening: 2# 1x12 flexion and extension with wrist in neutral resting off of table, 1x12 pronation/supination, 1x12 radial/ulnar deviation  -Wrist strengthening: 3# 1x12 flexion and extension with wrist in neutral resting off of table, 1x12 pronation/supination, 1x12 radial/ulnar deviation  -Pinch Strength: green clothespin used to grasp 1x 8 foam cubes -Coordination and in hand manipulation: D1/2 to retrieve small peg from table, finger-palm-finger translation, 1x10  -Manual therapy: soft tissue mobilization and myofascial release to decrease fascial restrictions and decrease pain.   11/04/21 -Digit isometrics: 3x10" abduction and adduction, D2&3, D3&4, D4&5 -Digit isometrics: 2x10" flexion and extension at each digit  -Passive Stretching: flexion, extension, radial/ulnar deviation -Wrist strengthening: 2# 2x10 flexion and extension with wrist in neutral resting off of table, 2x10 pronation/supination, 2x10 radial/ulnar deviation  -Pinch Strength: green clothespin used to grasp 1x 5 foam cubes, Red clothespin 1x10 foam cubes due to fatigue after green clothespin -Coordination: grooved pegs, D1/2 pinch to retrieve and place grooved pegs  11/02/21 -Passive Stretching: flexion, extension, radial/ulnar deviation -Wrist strengthening: 2# 2x10 flexion and extension with wrist in neutral resting off of table, 2x10 pronation/supination, 2x10 radial/ulnar deviation -Coordination: D1/5 pinch to retrieve and place 16 small manipulatives, x2 sets -Finger strengthening: yellow rubber band, abduction x15 reps -grip strength: using a PVC Pipe with a full grip to push down into red theraputty, 1x20 reps -Pinch Strength: green clothespin used to grasp 1x 5 foam cubes, Red clothespin 1x5  foam cubes due to fatigue after green clothespin   PATIENT EDUCATION: Education details: Review HEP Person educated: Patient Education method:  Explanation and Demonstration Education comprehension: verbalized understanding   HOME EXERCISE PROGRAM Eval: continue CIR finger ROM, washcloth squeeze 09/28/21: Tendon glides and wrist A/ROM 7/13: Theraputty, opposition  8/3: Scar Massage and desensitization  8/17: Digit isometrics     ASSESSMENT:  CLINICAL IMPRESSION: A: Pt able to tolerate increased resistance and weight with forearm strengthening. Pt also with increased endurance with pinch strength. Continues with some discomfort in D1 with resistive exercise. Measurements taken for discharge today with pt progressing with strength and ROM, WFL, and appropriate for discharge. Pt agreeable to discharge with HEP for grip and pinch strength.     PLAN:   OT FREQUENCY: Discharge     Mathews Robinsons, OTR/L [phone]  11/09/2021, 4:16 PM

## 2021-11-09 NOTE — Telephone Encounter (Signed)
Noted  

## 2021-11-16 ENCOUNTER — Ambulatory Visit (HOSPITAL_COMMUNITY): Payer: Medicare Other | Admitting: Physical Therapy

## 2021-11-16 ENCOUNTER — Encounter (HOSPITAL_COMMUNITY): Payer: BC Managed Care – PPO

## 2021-11-18 DIAGNOSIS — U071 COVID-19: Secondary | ICD-10-CM | POA: Diagnosis not present

## 2021-11-18 DIAGNOSIS — R059 Cough, unspecified: Secondary | ICD-10-CM | POA: Diagnosis not present

## 2021-12-01 ENCOUNTER — Telehealth (HOSPITAL_COMMUNITY): Payer: Self-pay

## 2021-12-01 NOTE — Telephone Encounter (Signed)
She will not come she has something going on and has a call into to MD. She will call us back to r/s

## 2021-12-02 ENCOUNTER — Encounter (HOSPITAL_COMMUNITY): Payer: Medicare Other

## 2021-12-07 DIAGNOSIS — S52502D Unspecified fracture of the lower end of left radius, subsequent encounter for closed fracture with routine healing: Secondary | ICD-10-CM | POA: Diagnosis not present

## 2021-12-07 DIAGNOSIS — S72142D Displaced intertrochanteric fracture of left femur, subsequent encounter for closed fracture with routine healing: Secondary | ICD-10-CM | POA: Diagnosis not present

## 2021-12-20 ENCOUNTER — Ambulatory Visit (HOSPITAL_COMMUNITY): Payer: Medicare Other | Attending: Internal Medicine | Admitting: Physical Therapy

## 2021-12-20 DIAGNOSIS — R262 Difficulty in walking, not elsewhere classified: Secondary | ICD-10-CM | POA: Diagnosis not present

## 2021-12-20 DIAGNOSIS — M25552 Pain in left hip: Secondary | ICD-10-CM | POA: Diagnosis not present

## 2021-12-20 DIAGNOSIS — M25652 Stiffness of left hip, not elsewhere classified: Secondary | ICD-10-CM | POA: Diagnosis not present

## 2021-12-20 DIAGNOSIS — R29898 Other symptoms and signs involving the musculoskeletal system: Secondary | ICD-10-CM | POA: Diagnosis not present

## 2021-12-20 DIAGNOSIS — M6281 Muscle weakness (generalized): Secondary | ICD-10-CM | POA: Insufficient documentation

## 2021-12-20 DIAGNOSIS — R2689 Other abnormalities of gait and mobility: Secondary | ICD-10-CM | POA: Insufficient documentation

## 2021-12-20 NOTE — Therapy (Signed)
OUTPATIENT PHYSICAL THERAPY TREATMENT   Patient Name: Jennifer Porter MRN: [id number] DOB:[birth year], 76 y.o., female Today's Date: 12/20/2021  Progress Note   Reporting Period 10/26/21 to 12/20/21   See note below for Objective Data and Assessment of Progress/Goals    PT End of Session - 12/20/21 1643     Visit Number 14    Number of Visits 16    Date for PT Re-Evaluation 11/23/21    Authorization Type BCBS state - ded met; 30 copay; no coins; no auth needed    Progress Note Due on Visit 20    PT Start Time 1645    PT Stop Time 1723    PT Time Calculation (min) 38 min    Equipment Utilized During Treatment Gait belt    Activity Tolerance Patient tolerated treatment well;Patient limited by fatigue    Behavior During Therapy WFL for tasks assessed/performed                Past Medical History:  Diagnosis Date   Allergy    seasonal   Asthma    Chronic pancreatitis ([place])    Complication of anesthesia    Diabetes mellitus without complication ([place])    GERD (gastroesophageal reflux disease)    hiatal hernia   H/O: gout    Hyperlipidemia    Hypertension    Pancreatitis    PONV (postoperative nausea and vomiting)    Past Surgical History:  Procedure Laterality Date   ABDOMINAL HYSTERECTOMY     APPENDECTOMY     Arthroscopic Left Knee Left 03/22/2003   CHOLECYSTECTOMY     in [place], 2010, without polyps   ESOPHAGOGASTRODUODENOSCOPY (EGD) WITH PROPOFOL N/A 11/08/2018   Procedure: ESOPHAGOGASTRODUODENOSCOPY (EGD) WITH PROPOFOL;  Surgeon: [name], MD;  Location: [facility];  Service: Endoscopy;  Laterality: N/A;   EUS N/A 11/08/2018   Procedure: UPPER ENDOSCOPIC ULTRASOUND (EUS) RADIAL;  Surgeon: [name], MD;  Location: [facility];  Service: Endoscopy;  Laterality: N/A;   INTRAMEDULLARY (IM) NAIL INTERTROCHANTERIC Left 08/27/2021   Procedure: INTRAMEDULLARY (IM) NAIL INTERTROCHANTRIC;  Surgeon: Shona Needles, MD;   Location: [facility];  Service: Orthopedics;  Laterality: Left;   ORIF WRIST FRACTURE Left 08/27/2021   Procedure: OPEN REDUCTION INTERNAL FIXATION (ORIF) WRIST FRACTURE;  Surgeon: Shona Needles, MD;  Location: [facility];  Service: Orthopedics;  Laterality: Left;   Patient Active Problem List   Diagnosis Date Noted   Pressure injury of skin 09/12/2021   Trauma 09/01/2021   Subdural hematoma (HCC)    Subarachnoid hemorrhage (HCC)    Acute blood loss anemia    Multiple injuries due to trauma 08/26/2021   Colon cancer screening 07/29/2021   Hyperlipidemia associated with type 2 diabetes mellitus ([place]) 11/28/2020   Acute recurrent pansinusitis 07/20/2020   Urinary retention 01/15/2020   Urinary urgency 01/15/2020   Abdominal cramping 08/21/2019   Protein-calorie malnutrition ([place]) 02/18/2019   Chronic pancreatitis ([place]) 08/24/2018   Loose stools 08/24/2018   Pancreatic calcification 08/22/2018   Acute pancreatitis without infection or necrosis 08/09/2018   Sigmoid diverticulosis 08/09/2018   3-vessel CAD 08/09/2018   Aortic atherosclerosis ([place]) 08/09/2018   Myelolipoma of right adrenal gland 08/09/2018   Renal cyst, right 08/09/2018   Hormone replacement therapy 01/12/2017   Hot flashes due to menopause 01/12/2017   Hypomagnesemia 06/27/2016   Diarrhea 03/28/2016   Vitamin D deficiency 10/03/2014   Left knee pain 07/02/2014   Hypertension  Gastroesophageal reflux disease    Type 2 diabetes mellitus without complication (HCC)    Allergy    Asthma    H/O: gout    Hyperlipidemia     PCP: Celene Squibb, MD  REFERRING PROVIDER: Cathlyn Parsons, PA-C   and verbal from surgeon Haddix, Thomasene Lot, MD  REFERRING DIAG: Trauma   THERAPY DIAG:  Pain in left hip  Difficulty in walking, not elsewhere classified  Muscle weakness (generalized)  Rationale for Evaluation and Treatment Rehabilitation  ONSET DATE: 08/26/2021 trauma, 08/27/2021 surgery L hip ORIF  SUBJECTIVE:    SUBJECTIVE STATEMENT : Patient returns after extended absence. She and her family had COVID and she has just recently gotten over it. She is still having issues with hip pain/ stiffness/ weakness.   PERTINENT HISTORY: L hip ORIF surgery 08/27/21 L wrist ORIF sugery 08/27/21 L knee arthritis  DM - denies N/T into feet currently, reports intermittently does get these symptoms present before trauma Hx of back fracture - no current back symptoms  Chronic pancreatitis, gallbladder removed, multiple comorbidities  PAIN:  Are you having pain? No   PRECAUTIONS: Fall  WEIGHT BEARING RESTRICTIONS  WBAT L LE - (of note, NWB to L UE being treated by OT but please advise for PT treatments)  FALLS:  Has patient fallen in last 6 months? Yes. Number of falls 1 this current trauma  LIVING ENVIRONMENT: Lives with: lives with their spouse Lives in: House/apartment Stairs: Yes: External: 1 steps; modified with a ramp currently Has following equipment at home: Walker - 2 wheeled and elevated toilet commode seat over regular toilet  OCCUPATION: retired Education officer, [employer]; very active around the house and with husband  PLOF: Independent  PATIENT GOALS "get back to be able to do what I usually do"   OBJECTIVE:   DIAGNOSTIC FINDINGS: Quoted from MD note upon hospital discharge  ''DG [place] 2-3 VIEWS LEFT  Result Date: 09/15/2021 CLINICAL DATA:  Left hip surgery 3 weeks ago EXAM: DG HIP (WITH OR WITHOUT PELVIS) 2-3V LEFT COMPARISON:  08/27/2021 FINDINGS: Postsurgical changes from left femur ORIF of intertrochanteric left femur fracture. Alignment is unchanged compared to the immediate postoperative images including mildly displaced lesser trochanteric fragment. No new fractures. Hip joint intact without dislocation. Previously seen soft tissue air has resolved. IMPRESSION: Satisfactory appearance of the left hip following recent ORIF. Electronically Signed   By: Davina Poke D.O.   On:  09/15/2021 15:46 "  PATIENT SURVEYS:  FOTO 33  10/26/21 41% function; 55% as of 12/20/21  EDEMA:  Circumferential: ankle circ L 24 cm verse R 23 cm with visibule edema at left foot  POSTURE: forward head, flexed trunk , and weight shift right  PALPATION: TTP at gross left hip into quad, left knee with light touch  LOWER EXTREMITY ROM:  Active ROM Right eval Left eval  Knee flexion WNL WNL  Knee extension WNL WNL  Ankle dorsiflexion WNL WNL  Ankle plantarflexion WNL WNL   (Blank rows = not tested)  LOWER EXTREMITY MMT: eval: modified all testing in sitting secondary to gross trauma/post op comfort 10/26/21:: able to tes tL  hip abduction in partial R sidelying and sitting  MMT Right eval Left eval Right  10/26/21 Left 10/26/21 RT 12/20/21 LT 12/20/21  Hip flexion 4+ 2+ 4+ 4 4+ 4+  Hip extension 4       Hip abduction 4+ 2+ 4+ 4 4+ 4-  Hip adduction 4+ 3  Knee flexion 4+ 3 4+ 4    Knee extension 4+ 3 4+ _0 Ankle dorsiflexion 4+ 3 4+ 4+ 4+ 4+  Ankle plantarflexion 4+ 3       (Blank rows = not tested)  LOWER EXTREMITY SPECIAL TESTS:  Hip special tests: NA secondary to post op   FUNCTIONAL TESTS:  2 minute walk test: 290 feet with notes in below section  GAIT: Distance walked: 290 feet Assistive device utilized: SPC Level of assistance: Supervision  Comments: Decreased stride length   Reassessment 10/26/21 2MWT 200 feet with SPC MMT FOTO  TODAY'S TREATMENT: 12/20/21 Reassessment  FOTO 2 MWT MMT  STS x 5: 12.3 sec with UE assist   11/09/21 Ambulation with SPC in parking lot negotiating curbs, ramps and uneven terrain  - 10 minutes  Step up and over 6 inch 2x 10 with LLE Standing hip abduction RTB at knees 2x 10 bilateral  Nu step seat 5 level 5 5 minutes   11/04/21 Ambulation with SPC in parking lot negotiating curbs, ramps and uneven terrain - 15 minutes Step up and over 6 inch 2x 10 with LLE STS 1 x 10 with black foam   PATIENT EDUCATION:   Education details: 7/31 probable DOMS 09/22/21 - evaluation findings, POC, PT scope of practice, education on edema management and DVT risk, HEP Person educated: Patient Education method: Explanation, Demonstration, and Handouts Education comprehension: verbalized understanding   HOME EXERCISE PROGRAM: Access Code: [email or web address] URL: [email or web address]  10/13/21- Sit to Stand with Arms Crossed  - 1 x daily - 7 x weekly - 2 sets - 10 reps  Date: 09/22/2021 - Seated March  - 2 x daily - 7 x weekly - 2 sets - 10 reps - Seated Isometric Hip Adduction with Ball  - 2 x daily - 7 x weekly - 2 sets - 10 reps - Seated Single Leg Hip Abduction  - 2 x daily - 7 x weekly - 2 sets - 10 reps 09/24/21 add red theraband - Seated Heel Toe Raises  - 3-5 x daily - 7 x weekly - 2 sets - 10 reps  ASSESSMENT:  CLINICAL IMPRESSION: Patient returns after extended absence due to [place]. Patient does show improved gait speed and gait mechanics. Patient continues to be limited by LE weakness, decreased balance and reduced activity tolerance which are negatively impacting functional ability. At this time, patient will continue to benefit from skilled therapy services to address these remaining deficits for reduced hip pain, improved LOF with ADLs and reduced risk for future falls.   OBJECTIVE IMPAIRMENTS Abnormal gait, decreased activity tolerance, decreased balance, decreased endurance, decreased mobility, difficulty walking, decreased strength, increased edema, impaired UE functional use, postural dysfunction, and pain.   ACTIVITY LIMITATIONS carrying, lifting, bending, standing, squatting, stairs, bathing, toileting, locomotion level, and caring for others  PARTICIPATION LIMITATIONS: meal prep, cleaning, laundry, driving, community activity, and yard work  PERSONAL FACTORS Age, Transportation, and 3+ comorbidities: DM, L UE trauma, L knee arthritis/pain chronic  are also affecting patient's functional  outcome.   REHAB POTENTIAL: Good  CLINICAL DECISION MAKING: Evolving/moderate complexity  EVALUATION COMPLEXITY: Moderate   GOALS: Goals reviewed with patient? Yes  SHORT TERM GOALS: Target date: 10/20/2021  Patient will be independent with initial HEP and self-management strategies to improve functional outcomes   Baseline: 09/22/21 - initiated today Goal status: MET  2.  Patient will be able to demonstrate at least 4-/5 for gross L LE strength to improve functional support on  L LE and progress function Baseline: 09/22/21 - see objective measures, L hip 2+/5 for hip flexion 8/8 MET see above Goal status: MET  3.  Patient will be able to demonstrate ability to ambulate with equal step length and no pain in order to progress L LE functional strength and [place].  Baseline: 09/22/21 - decreased stride length left 10/26/21 decreased R step length, decreased L hip extension, slight antalgic on L 8/8 not equal and continued symptoms (12/20/21: see GAIT) Goal status: MET   LONG TERM GOALS: Target date: 11/17/21   Patient will be independent with advanced HEP and self-management strategies to improve functional outcomes   Baseline: 09/22/21 - to be established Goal status: IN PROGRESS  2.  Patient will demonstrate improved functional abilities by improving FOTO by at least 15 points to 47 or higher.    Baseline: 55% function Goal status: MET  3.  Patient will be able to ambulate without AD independently for at least 325 feet during 2 minute walk test in order to access community distance.  Baseline: 290 feet with SPC Goal status: REVISED   4.  Patient will have equal to or > 4+/5 MMT throughout LLE to improve ability to perform functional mobility, stair ambulation and ADLs.  Baseline: See MMT Goal status: NEW   5. Patient will be able to perform stand x 5 in < 11.4 seconds to demonstrate improvement in functional mobility and reduced risk for falls.  Baseline: 12.3 sec with UE assist  Goal  status: NEW    PLAN: PT FREQUENCY: 2x/week  PT DURATION: 4 weeks  PLANNED INTERVENTIONS: Therapeutic exercises, Therapeutic activity, Neuromuscular re-education, Balance training, Gait training, Patient/Family education, Joint mobilization, Stair training, Spinal mobilization, Cryotherapy, Moist heat, scar mobilization, Taping, Manual therapy, and Re-evaluation  PLAN FOR NEXT SESSION:  Progress LE strength, balance and gait as tolerated.     4:46 PM, 12/20/21 Josue Hector PT DPT  Physical Therapist with [facility]  [phone]

## 2021-12-23 ENCOUNTER — Ambulatory Visit (HOSPITAL_COMMUNITY): Payer: Medicare Other

## 2021-12-23 DIAGNOSIS — R262 Difficulty in walking, not elsewhere classified: Secondary | ICD-10-CM

## 2021-12-23 DIAGNOSIS — M25652 Stiffness of left hip, not elsewhere classified: Secondary | ICD-10-CM | POA: Diagnosis not present

## 2021-12-23 DIAGNOSIS — M6281 Muscle weakness (generalized): Secondary | ICD-10-CM | POA: Diagnosis not present

## 2021-12-23 DIAGNOSIS — M25552 Pain in left hip: Secondary | ICD-10-CM | POA: Diagnosis not present

## 2021-12-23 DIAGNOSIS — R29898 Other symptoms and signs involving the musculoskeletal system: Secondary | ICD-10-CM | POA: Diagnosis not present

## 2021-12-23 DIAGNOSIS — R2689 Other abnormalities of gait and mobility: Secondary | ICD-10-CM | POA: Diagnosis not present
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
## 2021-12-24 DIAGNOSIS — I10 Essential (primary) hypertension: Secondary | ICD-10-CM | POA: Diagnosis not present

## 2021-12-24 DIAGNOSIS — Z23 Encounter for immunization: Secondary | ICD-10-CM | POA: Diagnosis not present

## 2021-12-24 DIAGNOSIS — Z0000 Encounter for general adult medical examination without abnormal findings: Secondary | ICD-10-CM | POA: Diagnosis not present

## 2021-12-28 ENCOUNTER — Ambulatory Visit (HOSPITAL_COMMUNITY): Payer: Medicare Other | Admitting: Physical Therapy

## 2021-12-28 ENCOUNTER — Encounter (HOSPITAL_COMMUNITY): Payer: Self-pay | Admitting: Physical Therapy

## 2021-12-28 DIAGNOSIS — M25552 Pain in left hip: Secondary | ICD-10-CM | POA: Diagnosis not present

## 2021-12-28 DIAGNOSIS — R29898 Other symptoms and signs involving the musculoskeletal system: Secondary | ICD-10-CM | POA: Diagnosis not present

## 2021-12-28 DIAGNOSIS — R2689 Other abnormalities of gait and mobility: Secondary | ICD-10-CM | POA: Diagnosis not present

## 2021-12-28 DIAGNOSIS — R262 Difficulty in walking, not elsewhere classified: Secondary | ICD-10-CM | POA: Diagnosis not present

## 2021-12-28 DIAGNOSIS — M6281 Muscle weakness (generalized): Secondary | ICD-10-CM

## 2021-12-28 DIAGNOSIS — M25652 Stiffness of left hip, not elsewhere classified: Secondary | ICD-10-CM

## 2021-12-28 NOTE — Therapy (Signed)
OUTPATIENT PHYSICAL THERAPY TREATMENT   Patient Name: Jennifer Porter MRN: [id number] DOB:[birth year], 76 y.o., female Today's Date: 12/28/2021  Progress Note   Reporting Period 10/26/21 to 12/20/21   See note below for Objective Data and Assessment of Progress/Goals    PT End of Session - 12/28/21 1344     Visit Number 16    Number of Visits 22    Date for PT Re-Evaluation 01/17/22    Authorization Type BCBS state - ded met; 30 copay; no coins; no auth needed    Progress Note Due on Visit 66    PT Start Time 1345    PT Stop Time 1425    PT Time Calculation (min) 40 min    Equipment Utilized During Treatment Gait belt    Activity Tolerance Patient tolerated treatment well;Patient limited by fatigue    Behavior During Therapy WFL for tasks assessed/performed                 Past Medical History:  Diagnosis Date   Allergy    seasonal   Asthma    Chronic pancreatitis ([place])    Complication of anesthesia    Diabetes mellitus without complication ([place])    GERD (gastroesophageal reflux disease)    hiatal hernia   H/O: gout    Hyperlipidemia    Hypertension    Pancreatitis    PONV (postoperative nausea and vomiting)    Past Surgical History:  Procedure Laterality Date   ABDOMINAL HYSTERECTOMY     APPENDECTOMY     Arthroscopic Left Knee Left 03/22/2003   CHOLECYSTECTOMY     in [place], 2010, without polyps   ESOPHAGOGASTRODUODENOSCOPY (EGD) WITH PROPOFOL N/A 11/08/2018   Procedure: ESOPHAGOGASTRODUODENOSCOPY (EGD) WITH PROPOFOL;  Surgeon: [name], MD;  Location: [facility];  Service: Endoscopy;  Laterality: N/A;   EUS N/A 11/08/2018   Procedure: UPPER ENDOSCOPIC ULTRASOUND (EUS) RADIAL;  Surgeon: [name], MD;  Location: [facility];  Service: Endoscopy;  Laterality: N/A;   INTRAMEDULLARY (IM) NAIL INTERTROCHANTERIC Left 08/27/2021   Procedure: INTRAMEDULLARY (IM) NAIL INTERTROCHANTRIC;  Surgeon: Shona Needles, MD;   Location: [place];  Service: Orthopedics;  Laterality: Left;   ORIF WRIST FRACTURE Left 08/27/2021   Procedure: OPEN REDUCTION INTERNAL FIXATION (ORIF) WRIST FRACTURE;  Surgeon: Shona Needles, MD;  Location: [place];  Service: Orthopedics;  Laterality: Left;   Patient Active Problem List   Diagnosis Date Noted   Pressure injury of skin 09/12/2021   Trauma 09/01/2021   Subdural hematoma (H[facility])    Subarachnoid hemorrhage (H[facility])    Acute blood loss anemia    Multiple injuries due to trauma 08/26/2021   Colon cancer screening 07/29/2021   Hyperlipidemia associated with type 2 diabetes mellitus ([place]) 11/28/2020   Acute recurrent pansinusitis 07/20/2020   Urinary retention 01/15/2020   Urinary urgency 01/15/2020   Abdominal cramping 08/21/2019   Protein-calorie malnutrition ([place]) 02/18/2019   Chronic pancreatitis ([place]) 08/24/2018   Loose stools 08/24/2018   Pancreatic calcification 08/22/2018   Acute pancreatitis without infection or necrosis 08/09/2018   Sigmoid diverticulosis 08/09/2018   3-vessel CAD 08/09/2018   Aortic atherosclerosis ([place]) 08/09/2018   Myelolipoma of right adrenal gland 08/09/2018   Renal cyst, right 08/09/2018   Hormone replacement therapy 01/12/2017   Hot flashes due to menopause 01/12/2017   Hypomagnesemia 06/27/2016   Diarrhea 03/28/2016   Vitamin D deficiency 10/03/2014   Left knee pain 07/02/2014   Hypertension  Gastroesophageal reflux disease    Type 2 diabetes mellitus without complication (HCC)    Allergy    Asthma    H/O: gout    Hyperlipidemia     PCP: Celene Squibb, MD  REFERRING PROVIDER: Cathlyn Parsons, PA-C   and verbal from surgeon Haddix, Thomasene Lot, MD  REFERRING DIAG: Trauma   THERAPY DIAG:  Pain in left hip  Difficulty in walking, not elsewhere classified  Muscle weakness (generalized)  Stiffness of left hip, not elsewhere classified  Other symptoms and signs involving the musculoskeletal system  Rationale for Evaluation and  Treatment Rehabilitation  ONSET DATE: 08/26/2021 trauma, 08/27/2021 surgery L hip ORIF  SUBJECTIVE:   SUBJECTIVE STATEMENT : Did a lot over the weekend. Sore and tired following. Still having trouble with walking but wants to be able to do it more normal.   PERTINENT HISTORY: L hip ORIF surgery 08/27/21 L wrist ORIF sugery 08/27/21 L knee arthritis  DM - denies N/T into feet currently, reports intermittently does get these symptoms present before trauma Hx of back fracture - no current back symptoms  Chronic pancreatitis, gallbladder removed, multiple comorbidities  PAIN:  Are you having pain? No   PRECAUTIONS: Fall  WEIGHT BEARING RESTRICTIONS  WBAT L LE - (of note, NWB to L UE being treated by OT but please advise for PT treatments)  FALLS:  Has patient fallen in last 6 months? Yes. Number of falls 1 this current trauma  LIVING ENVIRONMENT: Lives with: lives with their spouse Lives in: House/apartment Stairs: Yes: External: 1 steps; modified with a ramp currently Has following equipment at home: Walker - 2 wheeled and elevated toilet commode seat over regular toilet  OCCUPATION: retired Education officer, [employer]; very active around the house and with husband  PLOF: Independent  PATIENT GOALS "get back to be able to do what I usually do"   OBJECTIVE:   DIAGNOSTIC FINDINGS: Quoted from MD note upon hospital discharge  ''DG Newborn 2-3 VIEWS LEFT  Result Date: 09/15/2021 CLINICAL DATA:  Left hip surgery 3 weeks ago EXAM: DG HIP (WITH OR WITHOUT PELVIS) 2-3V LEFT COMPARISON:  08/27/2021 FINDINGS: Postsurgical changes from left femur ORIF of intertrochanteric left femur fracture. Alignment is unchanged compared to the immediate postoperative images including mildly displaced lesser trochanteric fragment. No new fractures. Hip joint intact without dislocation. Previously seen soft tissue air has resolved. IMPRESSION: Satisfactory appearance of the left hip following recent  ORIF. Electronically Signed   By: Davina Poke D.O.   On: 09/15/2021 15:46 "  PATIENT SURVEYS:  FOTO 33  10/26/21 41% function; 55% as of 12/20/21  EDEMA:  Circumferential: ankle circ L 24 cm verse R 23 cm with visibule edema at left foot  POSTURE: forward head, flexed trunk , and weight shift right  PALPATION: TTP at gross left hip into quad, left knee with light touch  LOWER EXTREMITY ROM:  Active ROM Right eval Left eval  Knee flexion WNL WNL  Knee extension WNL WNL  Ankle dorsiflexion WNL WNL  Ankle plantarflexion WNL WNL   (Blank rows = not tested)  LOWER EXTREMITY MMT: eval: modified all testing in sitting secondary to gross trauma/post op comfort 10/26/21:: able to tes tL  hip abduction in partial R sidelying and sitting  MMT Right eval Left eval Right  10/26/21 Left 10/26/21 RT 12/20/21 LT 12/20/21  Hip flexion 4+ 2+ 4+ 4 4+ 4+  Hip extension 4       Hip abduction 4+  2+ 4+ 4 4+ 4-  Hip adduction 4+ 3      Knee flexion 4+ 3 4+ 4    Knee extension 4+ 3 4+ '4 4 4  ' Ankle dorsiflexion 4+ 3 4+ 4+ 4+ 4+  Ankle plantarflexion 4+ 3       (Blank rows = not tested)  LOWER EXTREMITY SPECIAL TESTS:  Hip special tests: NA secondary to post op   FUNCTIONAL TESTS:  2 minute walk test: 290 feet with notes in below section  GAIT: Distance walked: 290 feet Assistive device utilized: SPC Level of assistance: Supervision  Comments: Decreased stride length   Reassessment 10/26/21 2MWT 200 feet with SPC MMT FOTO  TODAY'S TREATMENT: 12/28/21 Nustep 5 minutes, seat 5, level 1 Heel/toe raises 2 x 10 Standing hip abduction RTB at knees 3x 10  Step up and over 6 inch 2x 10 with LLE Hurdles 6 inch 5RT Lateral hurdles 6 inch 4RT STS 2x 10   12/23/2021 Standing:  Heel/toe raises 2 x 10  Marching 2 x 10  Mini squats 2 x 10  Hip abduction 2 x 10  4" step ups x 10 each   Nustep seat 5 x 5' level 1       12/20/21 Reassessment  FOTO 2 MWT MMT  STS x 5: 12.3 sec with  UE assist   11/09/21 Ambulation with SPC in parking lot negotiating curbs, ramps and uneven terrain  - 10 minutes  Step up and over 6 inch 2x 10 with LLE Standing hip abduction RTB at knees 2x 10 bilateral  Nu step seat 5 level 5 5 minutes   11/04/21 Ambulation with SPC in parking lot negotiating curbs, ramps and uneven terrain - 15 minutes Step up and over 6 inch 2x 10 with LLE STS 1 x 10 with black foam   PATIENT EDUCATION:  Education details: 7/31 probable DOMS 09/22/21 - evaluation findings, POC, PT scope of practice, education on edema management and DVT risk, HEP Person educated: Patient Education method: Explanation, Demonstration, and Handouts Education comprehension: verbalized understanding   HOME EXERCISE PROGRAM: Access Code: [email or web address] URL: [email or web address]  10/13/21- Sit to Stand with Arms Crossed  - 1 x daily - 7 x weekly - 2 sets - 10 reps  Date: 09/22/2021 - Seated March  - 2 x daily - 7 x weekly - 2 sets - 10 reps - Seated Isometric Hip Adduction with Ball  - 2 x daily - 7 x weekly - 2 sets - 10 reps - Seated Single Leg Hip Abduction  - 2 x daily - 7 x weekly - 2 sets - 10 reps 09/24/21 add red theraband - Seated Heel Toe Raises  - 3-5 x daily - 7 x weekly - 2 sets - 10 reps  ASSESSMENT:  CLINICAL IMPRESSION: Began session on nu step for dynamic warm up and conditioning. Progressed back to previously completed strengthening exercises before patient got COVID. Completes functional strengthening and balance training which is tolerated well. Patient will continue to benefit from physical therapy in order to improve function and reduce impairment.    OBJECTIVE IMPAIRMENTS Abnormal gait, decreased activity tolerance, decreased balance, decreased endurance, decreased mobility, difficulty walking, decreased strength, increased edema, impaired UE functional use, postural dysfunction, and pain.   ACTIVITY LIMITATIONS carrying, lifting, bending, standing,  squatting, stairs, bathing, toileting, locomotion level, and caring for others  PARTICIPATION LIMITATIONS: meal prep, cleaning, laundry, driving, community activity, and yard work  PERSONAL FACTORS Age, Transportation, and 3+ comorbidities: DM, L UE  trauma, L knee arthritis/pain chronic  are also affecting patient's functional outcome.   REHAB POTENTIAL: Good  CLINICAL DECISION MAKING: Evolving/moderate complexity  EVALUATION COMPLEXITY: Moderate   GOALS: Goals reviewed with patient? Yes  SHORT TERM GOALS: Target date: 10/20/2021  Patient will be independent with initial HEP and self-management strategies to improve functional outcomes   Baseline: 09/22/21 - initiated today Goal status: MET  2.  Patient will be able to demonstrate at least 4-/5 for gross L LE strength to improve functional support on L LE and progress function Baseline: 09/22/21 - see objective measures, L hip 2+/5 for hip flexion 8/8 MET see above Goal status: MET  3.  Patient will be able to demonstrate ability to ambulate with equal step length and no pain in order to progress L LE functional strength and Wbing.  Baseline: 09/22/21 - decreased stride length left 10/26/21 decreased R step length, decreased L hip extension, slight antalgic on L 8/8 not equal and continued symptoms (12/20/21: see GAIT) Goal status: MET   LONG TERM GOALS: Target date: 11/17/21   Patient will be independent with advanced HEP and self-management strategies to improve functional outcomes   Baseline: 09/22/21 - to be established Goal status: IN PROGRESS  2.  Patient will demonstrate improved functional abilities by improving FOTO by at least 15 points to 47 or higher.    Baseline: 55% function Goal status: MET  3.  Patient will be able to ambulate without AD independently for at least 325 feet during 2 minute walk test in order to access community distance.  Baseline: 290 feet with SPC Goal status: REVISED   4.  Patient will have equal to or  > 4+/5 MMT throughout LLE to improve ability to perform functional mobility, stair ambulation and ADLs.  Baseline: See MMT Goal status: NEW   5. Patient will be able to perform stand x 5 in < 11.4 seconds to demonstrate improvement in functional mobility and reduced risk for falls.  Baseline: 12.3 sec with UE assist  Goal status: NEW    PLAN: PT FREQUENCY: 2x/week  PT DURATION: 4 weeks  PLANNED INTERVENTIONS: Therapeutic exercises, Therapeutic activity, Neuromuscular re-education, Balance training, Gait training, Patient/Family education, Joint mobilization, Stair training, Spinal mobilization, Cryotherapy, Moist heat, scar mobilization, Taping, Manual therapy, and Re-evaluation  PLAN FOR NEXT SESSION:  Progress LE strength, balance and gait as tolerated.     1:45 PM, 12/28/21 Mearl Latin PT, DPT Physical Therapist at [facility]

## 2021-12-29 ENCOUNTER — Ambulatory Visit (HOSPITAL_COMMUNITY): Payer: Medicare Other | Admitting: Physical Therapy

## 2021-12-29 DIAGNOSIS — M6281 Muscle weakness (generalized): Secondary | ICD-10-CM | POA: Diagnosis not present

## 2021-12-29 DIAGNOSIS — R262 Difficulty in walking, not elsewhere classified: Secondary | ICD-10-CM

## 2021-12-29 DIAGNOSIS — R2689 Other abnormalities of gait and mobility: Secondary | ICD-10-CM | POA: Diagnosis not present

## 2021-12-29 DIAGNOSIS — M25552 Pain in left hip: Secondary | ICD-10-CM

## 2021-12-29 DIAGNOSIS — M25652 Stiffness of left hip, not elsewhere classified: Secondary | ICD-10-CM

## 2021-12-29 DIAGNOSIS — R29898 Other symptoms and signs involving the musculoskeletal system: Secondary | ICD-10-CM | POA: Diagnosis not present

## 2021-12-29 NOTE — Therapy (Signed)
OUTPATIENT PHYSICAL THERAPY TREATMENT   Patient Name: Jennifer Porter MRN: [id number] DOB:Nov 03, 1945, 76 y.o., female Today's Date: 12/29/2021   PT End of Session - 12/29/21 1556    Visit Number 17    Number of Visits 22    Date for PT Re-Evaluation 01/17/22    Authorization Type BCBS state - ded met; 30 copay; no coins; no auth needed    Progress Note Due on Visit 69    PT Start Time 1518    PT Stop Time 1557    PT Time Calculation (min) 39 min    Equipment Utilized During Treatment Gait belt    Activity Tolerance Patient tolerated treatment well;Patient limited by fatigue    Behavior During Therapy WFL for tasks assessed/performed                 Past Medical History:  Diagnosis Date   Allergy    seasonal   Asthma    Chronic pancreatitis ([place])    Complication of anesthesia    Diabetes mellitus without complication ([place])    GERD (gastroesophageal reflux disease)    hiatal hernia   H/O: gout    Hyperlipidemia    Hypertension    Pancreatitis    PONV (postoperative nausea and vomiting)    Past Surgical History:  Procedure Laterality Date   ABDOMINAL HYSTERECTOMY     APPENDECTOMY     Arthroscopic Left Knee Left 03/22/2003   CHOLECYSTECTOMY     in [place], 2010, without polyps   ESOPHAGOGASTRODUODENOSCOPY (EGD) WITH PROPOFOL N/A 11/08/2018   Procedure: ESOPHAGOGASTRODUODENOSCOPY (EGD) WITH PROPOFOL;  Surgeon: [name], MD;  Location: [facility];  Service: Endoscopy;  Laterality: N/A;   EUS N/A 11/08/2018   Procedure: UPPER ENDOSCOPIC ULTRASOUND (EUS) RADIAL;  Surgeon: [name], MD;  Location: [facility];  Service: Endoscopy;  Laterality: N/A;   INTRAMEDULLARY (IM) NAIL INTERTROCHANTERIC Left 08/27/2021   Procedure: INTRAMEDULLARY (IM) NAIL INTERTROCHANTRIC;  Surgeon: Shona Needles, MD;  Location: [place];  Service: Orthopedics;  Laterality: Left;   ORIF WRIST FRACTURE Left 08/27/2021   Procedure: OPEN REDUCTION  INTERNAL FIXATION (ORIF) WRIST FRACTURE;  Surgeon: Shona Needles, MD;  Location: [place];  Service: Orthopedics;  Laterality: Left;   Patient Active Problem List   Diagnosis Date Noted   Pressure injury of skin 09/12/2021   Trauma 09/01/2021   Subdural hematoma ([facility])    Subarachnoid hemorrhage ([facility])    Acute blood loss anemia    Multiple injuries due to trauma 08/26/2021   Colon cancer screening 07/29/2021   Hyperlipidemia associated with type 2 diabetes mellitus ([place]) 11/28/2020   Acute recurrent pansinusitis 07/20/2020   Urinary retention 01/15/2020   Urinary urgency 01/15/2020   Abdominal cramping 08/21/2019   Protein-calorie malnutrition ([place]) 02/18/2019   Chronic pancreatitis ([place]) 08/24/2018   Loose stools 08/24/2018   Pancreatic calcification 08/22/2018   Acute pancreatitis without infection or necrosis 08/09/2018   Sigmoid diverticulosis 08/09/2018   3-vessel CAD 08/09/2018   Aortic atherosclerosis ([place]) 08/09/2018   Myelolipoma of right adrenal gland 08/09/2018   Renal cyst, right 08/09/2018   Hormone replacement therapy 01/12/2017   Hot flashes due to menopause 01/12/2017   Hypomagnesemia 06/27/2016   Diarrhea 03/28/2016   Vitamin D deficiency 10/03/2014   Left knee pain 07/02/2014   Hypertension    Gastroesophageal reflux disease    Type 2 diabetes mellitus without complication ([place])    Allergy    Asthma  H/O: gout    Hyperlipidemia     PCP: Celene Squibb, MD  REFERRING PROVIDER: Cathlyn Parsons, PA-C   and verbal from surgeon Haddix, Thomasene Lot, MD  REFERRING DIAG: Trauma   THERAPY DIAG:  Pain in left hip  Difficulty in walking, not elsewhere classified  Muscle weakness (generalized)  Stiffness of left hip, not elsewhere classified  Rationale for Evaluation and Treatment Rehabilitation  ONSET DATE: 08/26/2021 trauma, 08/27/2021 surgery L hip ORIF  SUBJECTIVE:   SUBJECTIVE STATEMENT :  Pt states that she is doing her exercises once a day.  She  is using her cane all the time now and at times in the house going without an assistive device.   PERTINENT HISTORY: L hip ORIF surgery 08/27/21 L wrist ORIF sugery 08/27/21 L knee arthritis  DM - denies N/T into feet currently, reports intermittently does get these symptoms present before trauma Hx of back fracture - no current back symptoms  Chronic pancreatitis, gallbladder removed, multiple comorbidities  PAIN:  Are you having pain? No   PRECAUTIONS: Fall  WEIGHT BEARING RESTRICTIONS  WBAT L LE - (of note, NWB to L UE being treated by OT but please advise for PT treatments)  FALLS:  Has patient fallen in last 6 months? Yes. Number of falls 1 this current trauma  LIVING ENVIRONMENT: Lives with: lives with their spouse Lives in: House/apartment Stairs: Yes: External: 1 steps; modified with a ramp currently Has following equipment at home: Walker - 2 wheeled and elevated toilet commode seat over regular toilet  OCCUPATION: retired Education officer, [employer]; very active around the house and with husband  PLOF: Independent  PATIENT GOALS "get back to be able to do what I usually do"   OBJECTIVE:   DIAGNOSTIC FINDINGS: Quoted from MD note upon hospital discharge  ''DG [place] 2-3 VIEWS LEFT  Result Date: 09/15/2021 CLINICAL DATA:  Left hip surgery 3 weeks ago EXAM: DG HIP (WITH OR WITHOUT PELVIS) 2-3V LEFT COMPARISON:  08/27/2021 FINDINGS: Postsurgical changes from left femur ORIF of intertrochanteric left femur fracture. Alignment is unchanged compared to the immediate postoperative images including mildly displaced lesser trochanteric fragment. No new fractures. Hip joint intact without dislocation. Previously seen soft tissue air has resolved. IMPRESSION: Satisfactory appearance of the left hip following recent ORIF. Electronically Signed   By: Davina Poke D.O.   On: 09/15/2021 15:46 "  PATIENT SURVEYS:  FOTO 33  10/26/21 41% function; 55% as of 12/20/21  EDEMA:   Circumferential: ankle circ L 24 cm verse R 23 cm with visibule edema at left foot  POSTURE: forward head, flexed trunk , and weight shift right  PALPATION: TTP at gross left hip into quad, left knee with light touch  LOWER EXTREMITY ROM:  Active ROM Right eval Left eval  Knee flexion WNL WNL  Knee extension WNL WNL  Ankle dorsiflexion WNL WNL  Ankle plantarflexion WNL WNL   (Blank rows = not tested)  LOWER EXTREMITY MMT: eval: modified all testing in sitting secondary to gross trauma/post op comfort 10/26/21:: able to tes tL  hip abduction in partial R sidelying and sitting  MMT Right eval Left eval Right  10/26/21 Left 10/26/21 RT 12/20/21 LT 12/20/21  Hip flexion 4+ 2+ 4+ 4 4+ 4+  Hip extension 4       Hip abduction 4+ 2+ 4+ 4 4+ 4-  Hip adduction 4+ 3      Knee flexion 4+ 3 4+ 4    Knee  extension 4+ 3 4+ '4 4 4  ' Ankle dorsiflexion 4+ 3 4+ 4+ 4+ 4+  Ankle plantarflexion 4+ 3       (Blank rows = not tested)  LOWER EXTREMITY SPECIAL TESTS:  Hip special tests: NA secondary to post op   FUNCTIONAL TESTS:  2 minute walk test: 290 feet with notes in below section  GAIT: Distance walked: 290 feet Assistive device utilized: SPC Level of assistance: Supervision  Comments: Decreased stride length   Reassessment 10/26/21 2MWT 200 feet with SPC MMT FOTO  TODAY'S TREATMENT: 12/29/21 Standing: Side step with blue tband x 2 RT Marching with blue theraband x 15  Squat with theraband x 15 Step up B 6" x 15  Laterals step up B 4" x 15  Standing Lt hip extension x 15  Reverse squat B x 10 Tandem stance x 3  Heel raises x 15 Sitting : LAQ LT  with blue theraband   12/28/21 Nustep 5 minutes, seat 5, level 1 Heel/toe raises 2 x 10 Standing hip abduction RTB at knees 3x 10  Step up and over 6 inch 2x 10 with LLE Hurdles 6 inch 5RT Lateral hurdles 6 inch 4RT STS 2x 10   12/23/2021 Standing:  Heel/toe raises 2 x 10  Marching 2 x 10  Mini squats 2 x 10  Hip  abduction 2 x 10  4" step ups x 10 each   Nustep seat 5 x 5' level 1        PATIENT EDUCATION:  Education details: 7/31 probable DOMS 09/22/21 - evaluation findings, POC, PT scope of practice, education on edema management and DVT risk, HEP Person educated: Patient Education method: Explanation, Demonstration, and Handouts Education comprehension: verbalized understanding   HOME EXERCISE PROGRAM: Access Code: [email or web address] URL: [email or web address]  10/13/21- Sit to Stand with Arms Crossed  - 1 x daily - 7 x weekly - 2 sets - 10 reps  Date: 09/22/2021 - Seated March  - 2 x daily - 7 x weekly - 2 sets - 10 reps - Seated Isometric Hip Adduction with Ball  - 2 x daily - 7 x weekly - 2 sets - 10 reps - Seated Single Leg Hip Abduction  - 2 x daily - 7 x weekly - 2 sets - 10 reps 09/24/21 add red theraband - Seated Heel Toe Raises  - 3-5 x daily - 7 x weekly - 2 sets - 10 reps  ASSESSMENT:  CLINICAL IMPRESSION: Began session gt training with cane as pt is not equalizing step length. . Added resistance to standing activity for increased strengthening. Patient will continue to benefit from physical therapy in order to improve function and reduce impairment.    OBJECTIVE IMPAIRMENTS Abnormal gait, decreased activity tolerance, decreased balance, decreased endurance, decreased mobility, difficulty walking, decreased strength, increased edema, impaired UE functional use, postural dysfunction, and pain.   ACTIVITY LIMITATIONS carrying, lifting, bending, standing, squatting, stairs, bathing, toileting, locomotion level, and caring for others  PARTICIPATION LIMITATIONS: meal prep, cleaning, laundry, driving, community activity, and yard work  PERSONAL FACTORS Age, Transportation, and 3+ comorbidities: DM, L UE trauma, L knee arthritis/pain chronic  are also affecting patient's functional outcome.   REHAB POTENTIAL: Good  CLINICAL DECISION MAKING: Evolving/moderate  complexity  EVALUATION COMPLEXITY: Moderate   GOALS: Goals reviewed with patient? Yes  SHORT TERM GOALS: Target date: 10/20/2021  Patient will be independent with initial HEP and self-management strategies to improve functional outcomes   Baseline: 09/22/21 - initiated today Goal status: MET  2.  Patient will be able to demonstrate at least 4-/5 for gross L LE strength to improve functional support on L LE and progress function Baseline: 09/22/21 - see objective measures, L hip 2+/5 for hip flexion 8/8 MET see above Goal status: MET  3.  Patient will be able to demonstrate ability to ambulate with equal step length and no pain in order to progress L LE functional strength and W[place].  Baseline: 09/22/21 - decreased stride length left 10/26/21 decreased R step length, decreased L hip extension, slight antalgic on L 8/8 not equal and continued symptoms (12/20/21: see GAIT) Goal status: MET   LONG TERM GOALS: Target date: 11/17/21   Patient will be independent with advanced HEP and self-management strategies to improve functional outcomes   Baseline: 09/22/21 - to be established Goal status: IN PROGRESS  2.  Patient will demonstrate improved functional abilities by improving FOTO by at least 15 points to 47 or higher.    Baseline: 55% function Goal status: MET  3.  Patient will be able to ambulate without AD independently for at least 325 feet during 2 minute walk test in order to access community distance.  Baseline: 290 feet with SPC Goal status: REVISED   4.  Patient will have equal to or > 4+/5 MMT throughout LLE to improve ability to perform functional mobility, stair ambulation and ADLs.  Baseline: See MMT Goal status: NEW   5. Patient will be able to perform stand x 5 in < 11.4 seconds to demonstrate improvement in functional mobility and reduced risk for falls.  Baseline: 12.3 sec with UE assist  Goal status: NEW    PLAN: PT FREQUENCY: 2x/week  PT DURATION: 4 weeks  PLANNED  INTERVENTIONS: Therapeutic exercises, Therapeutic activity, Neuromuscular re-education, Balance training, Gait training, Patient/Family education, Joint mobilization, Stair training, Spinal mobilization, Cryotherapy, Moist heat, scar mobilization, Taping, Manual therapy, and Re-evaluation  PLAN FOR NEXT SESSION:  Progress LE strength, balance and gait as tolerated.   Rayetta Humphrey, PT CLT [phone]  [phone]

## 2022-01-05 ENCOUNTER — Ambulatory Visit (HOSPITAL_COMMUNITY): Payer: Medicare Other | Admitting: Physical Therapy

## 2022-01-05 DIAGNOSIS — R262 Difficulty in walking, not elsewhere classified: Secondary | ICD-10-CM

## 2022-01-05 DIAGNOSIS — M25552 Pain in left hip: Secondary | ICD-10-CM

## 2022-01-05 DIAGNOSIS — M6281 Muscle weakness (generalized): Secondary | ICD-10-CM | POA: Diagnosis not present

## 2022-01-05 DIAGNOSIS — R29898 Other symptoms and signs involving the musculoskeletal system: Secondary | ICD-10-CM

## 2022-01-05 DIAGNOSIS — R2689 Other abnormalities of gait and mobility: Secondary | ICD-10-CM | POA: Diagnosis not present

## 2022-01-05 DIAGNOSIS — M25652 Stiffness of left hip, not elsewhere classified: Secondary | ICD-10-CM | POA: Diagnosis not present

## 2022-01-05 NOTE — Therapy (Signed)
OUTPATIENT PHYSICAL THERAPY TREATMENT   Patient Name: Jennifer Porter MRN: [id number] DOB:16-Jan-1946, 76 y.o., female Today's Date: 01/05/2022      PT End of Session - 01/05/22 1650     Visit Number 18    Number of Visits 22    Date for PT Re-Evaluation 01/17/22    Authorization Type BCBS state - ded met; 30 copay; no coins; no auth needed    Progress Note Due on Visit 86    PT Start Time 1600    PT Stop Time 9833    PT Time Calculation (min) 47 min    Equipment Utilized During Treatment Gait belt    Activity Tolerance Patient tolerated treatment well;Patient limited by fatigue    Behavior During Therapy WFL for tasks assessed/performed                     Past Medical History:  Diagnosis Date   Allergy    seasonal   Asthma    Chronic pancreatitis ([place])    Complication of anesthesia    Diabetes mellitus without complication ([place])    GERD (gastroesophageal reflux disease)    hiatal hernia   H/O: gout    Hyperlipidemia    Hypertension    Pancreatitis    PONV (postoperative nausea and vomiting)    Past Surgical History:  Procedure Laterality Date   ABDOMINAL HYSTERECTOMY     APPENDECTOMY     Arthroscopic Left Knee Left 03/22/2003   CHOLECYSTECTOMY     in [place], 2010, without polyps   ESOPHAGOGASTRODUODENOSCOPY (EGD) WITH PROPOFOL N/A 11/08/2018   Procedure: ESOPHAGOGASTRODUODENOSCOPY (EGD) WITH PROPOFOL;  Surgeon: [name], MD;  Location: [facility];  Service: Endoscopy;  Laterality: N/A;   EUS N/A 11/08/2018   Procedure: UPPER ENDOSCOPIC ULTRASOUND (EUS) RADIAL;  Surgeon: [name], MD;  Location: [facility];  Service: Endoscopy;  Laterality: N/A;   INTRAMEDULLARY (IM) NAIL INTERTROCHANTERIC Left 08/27/2021   Procedure: INTRAMEDULLARY (IM) NAIL INTERTROCHANTRIC;  Surgeon: Shona Needles, MD;  Location: [facility];  Service: Orthopedics;  Laterality: Left;   ORIF WRIST FRACTURE Left 08/27/2021   Procedure: OPEN  REDUCTION INTERNAL FIXATION (ORIF) WRIST FRACTURE;  Surgeon: Shona Needles, MD;  Location: [facility];  Service: Orthopedics;  Laterality: Left;   Patient Active Problem List   Diagnosis Date Noted   Pressure injury of skin 09/12/2021   Trauma 09/01/2021   Subdural hematoma (H[facility])    Subarachnoid hemorrhage ([facility])    Acute blood loss anemia    Multiple injuries due to trauma 08/26/2021   Colon cancer screening 07/29/2021   Hyperlipidemia associated with type 2 diabetes mellitus ([place]) 11/28/2020   Acute recurrent pansinusitis 07/20/2020   Urinary retention 01/15/2020   Urinary urgency 01/15/2020   Abdominal cramping 08/21/2019   Protein-calorie malnutrition ([place]) 02/18/2019   Chronic pancreatitis ([place]) 08/24/2018   Loose stools 08/24/2018   Pancreatic calcification 08/22/2018   Acute pancreatitis without infection or necrosis 08/09/2018   Sigmoid diverticulosis 08/09/2018   3-vessel CAD 08/09/2018   Aortic atherosclerosis ([place]) 08/09/2018   Myelolipoma of right adrenal gland 08/09/2018   Renal cyst, right 08/09/2018   Hormone replacement therapy 01/12/2017   Hot flashes due to menopause 01/12/2017   Hypomagnesemia 06/27/2016   Diarrhea 03/28/2016   Vitamin D deficiency 10/03/2014   Left knee pain 07/02/2014   Hypertension    Gastroesophageal reflux disease    Type 2 diabetes mellitus without complication ([place])  Allergy    Asthma    H/O: gout    Hyperlipidemia     PCP: Celene Squibb, MD  REFERRING PROVIDER: Cathlyn Parsons, PA-C   and verbal from surgeon Haddix, Thomasene Lot, MD  REFERRING DIAG: Trauma   THERAPY DIAG:  Pain in left hip  Difficulty in walking, not elsewhere classified  Muscle weakness (generalized)  Other symptoms and signs involving the musculoskeletal system  Rationale for Evaluation and Treatment Rehabilitation  ONSET DATE: 08/26/2021 trauma, 08/27/2021 surgery L hip ORIF  SUBJECTIVE:   SUBJECTIVE STATEMENT :  Pt states that she is having a bad  day as her back, hip and knee are bothering her.   PERTINENT HISTORY: L hip ORIF surgery 08/27/21 L wrist ORIF sugery 08/27/21 L knee arthritis  DM - denies N/T into feet currently, reports intermittently does get these symptoms present before trauma Hx of back fracture - no current back symptoms  Chronic pancreatitis, gallbladder removed, multiple comorbidities  PAIN:  Are you having pain? No   PRECAUTIONS: Fall  WEIGHT BEARING RESTRICTIONS  WBAT L LE - (of note, NWB to L UE being treated by OT but please advise for PT treatments)  FALLS:  Has patient fallen in last 6 months? Yes. Number of falls 1 this current trauma  LIVING ENVIRONMENT: Lives with: lives with their spouse Lives in: House/apartment Stairs: Yes: External: 1 steps; modified with a ramp currently Has following equipment at home: Walker - 2 wheeled and elevated toilet commode seat over regular toilet  OCCUPATION: retired Education officer, [employer]; very active around the house and with husband  PLOF: Independent  PATIENT GOALS "get back to be able to do what I usually do"   OBJECTIVE:   DIAGNOSTIC FINDINGS: Quoted from MD note upon hospital discharge  ''DG [place] 2-3 VIEWS LEFT  Result Date: 09/15/2021 CLINICAL DATA:  Left hip surgery 3 weeks ago EXAM: DG HIP (WITH OR WITHOUT PELVIS) 2-3V LEFT COMPARISON:  08/27/2021 FINDINGS: Postsurgical changes from left femur ORIF of intertrochanteric left femur fracture. Alignment is unchanged compared to the immediate postoperative images including mildly displaced lesser trochanteric fragment. No new fractures. Hip joint intact without dislocation. Previously seen soft tissue air has resolved. IMPRESSION: Satisfactory appearance of the left hip following recent ORIF. Electronically Signed   By: Davina Poke D.O.   On: 09/15/2021 15:46 "  PATIENT SURVEYS:  FOTO 33  10/26/21 41% function; 55% as of 12/20/21  EDEMA:  Circumferential: ankle circ L 24 cm verse R 23 cm with  visibule edema at left foot  POSTURE: forward head, flexed trunk , and weight shift right  PALPATION: TTP at gross left hip into quad, left knee with light touch  LOWER EXTREMITY ROM:  Active ROM Right eval Left eval  Knee flexion WNL WNL  Knee extension WNL WNL  Ankle dorsiflexion WNL WNL  Ankle plantarflexion WNL WNL   (Blank rows = not tested)  LOWER EXTREMITY MMT: eval: modified all testing in sitting secondary to gross trauma/post op comfort 10/26/21:: able to tes tL  hip abduction in partial R sidelying and sitting  MMT Right eval Left eval Right  10/26/21 Left 10/26/21 RT 12/20/21 LT 12/20/21  Hip flexion 4+ 2+ 4+ 4 4+ 4+  Hip extension 4       Hip abduction 4+ 2+ 4+ 4 4+ 4-  Hip adduction 4+ 3      Knee flexion 4+ 3 4+ 4    Knee extension 4+ 3 4+ 4  4 4  Ankle dorsiflexion 4+ 3 4+ 4+ 4+ 4+  Ankle plantarflexion 4+ 3       (Blank rows = not tested)  LOWER EXTREMITY SPECIAL TESTS:  Hip special tests: NA secondary to post op   FUNCTIONAL TESTS:  2 minute walk test: 290 feet with notes in below section  GAIT: Distance walked: 290 feet Assistive device utilized: SPC Level of assistance: Supervision  Comments: Decreased stride length   Reassessment 10/26/21 2MWT 200 feet with SPC MMT FOTO  TODAY'S TREATMENT: 01/05/22 Gait without assistive device x 100 ft Side step with blue tband x 2 RT Marching  x 15  Squat with theraband x 15 Step up B 4" x 15  Laterals step up B 4" x 15  Standing B hip extension x 10 Reverse squat B x 10 Tandem stance x 3  Heel raises x 15 Sitting : Sit to stand x 15 Supine:  Knee to chest hold 1' x 2  Hamstring stretch 1' x 2  Piriformis stretch x 3 for 30 seconds  Marching Bridge x 10  12/29/21 Standing: Side step with blue tband x 2 RT Marching with blue theraband x 15  Squat with theraband x 15 Step up B 6" x 15  Laterals step up B 4" x 15  Standing Lt hip extension x 15  Reverse squat B x 10 Tandem stance x 3  Heel  raises x 15 Sitting : LAQ LT  with blue theraband   12/28/21 Nustep 5 minutes, seat 5, level 1 Heel/toe raises 2 x 10 Standing hip abduction RTB at knees 3x 10  Step up and over 6 inch 2x 10 with LLE Hurdles 6 inch 5RT Lateral hurdles 6 inch 4RT STS 2x 10   12/23/2021 Standing:  Heel/toe raises 2 x 10  Marching 2 x 10  Mini squats 2 x 10  Hip abduction 2 x 10  4" step ups x 10 each   Nustep seat 5 x 5' level 1        PATIENT EDUCATION:  Education details: 7/31 probable DOMS 09/22/21 - evaluation findings, POC, PT scope of practice, education on edema management and DVT risk, HEP Person educated: Patient Education method: Explanation, Demonstration, and Handouts Education comprehension: verbalized understanding   HOME EXERCISE PROGRAM: 01/05/22 Access Code: [email or web address] URL: [email or web address] Date: 01/05/2022 Prepared by: Rayetta Humphrey   - Hooklying Active Hamstring Stretch  - 1 x daily - 7 x weekly - 1 sets - 3 reps - one minute hold - Supine Single Knee to Chest Stretch  - 1 x daily - 7 x weekly - 1 sets - 3 reps - 1 minute  hold - Supine Bridge  - 2 x daily - 7 x weekly - 1 sets - 10 reps - 10" hold - Heel Raises with Counter Support  - 2 x daily - 7 x weekly - 1 sets - 10 reps - 5" hold - Mini Squat with Counter Support  - 2 x daily - 7 x weekly - 1 sets - 10 reps - 5" hold - Side Stepping with Resistance at Thighs  - 2 x daily - 7 x weekly - 3 sets - 10 reps Access Code: [email or web address] URL: [email or web address]  10/13/21- Sit to Stand with Arms Crossed  - 1 x daily - 7 x weekly - 2 sets - 10 reps  Date: 09/22/2021 - Seated March  - 2 x daily - 7 x weekly - 2 sets - 10 reps - Seated  Isometric Hip Adduction with Ball  - 2 x daily - 7 x weekly - 2 sets - 10 reps - Seated Single Leg Hip Abduction  - 2 x daily - 7 x weekly - 2 sets - 10 reps 09/24/21 add red theraband - Seated Heel Toe Raises  - 3-5 x daily - 7 x weekly - 2 sets - 10  reps  ASSESSMENT:  CLINICAL IMPRESSION: Therapy continued to work on both strengthening and stretching to improve safety of gait.  Therapist also updated pt HEP.   Patient will continue to benefit from physical therapy in order to improve function and reduce impairment.    OBJECTIVE IMPAIRMENTS Abnormal gait, decreased activity tolerance, decreased balance, decreased endurance, decreased mobility, difficulty walking, decreased strength, increased edema, impaired UE functional use, postural dysfunction, and pain.   ACTIVITY LIMITATIONS carrying, lifting, bending, standing, squatting, stairs, bathing, toileting, locomotion level, and caring for others  PARTICIPATION LIMITATIONS: meal prep, cleaning, laundry, driving, community activity, and yard work  PERSONAL FACTORS Age, Transportation, and 3+ comorbidities: DM, L UE trauma, L knee arthritis/pain chronic  are also affecting patient's functional outcome.   REHAB POTENTIAL: Good  CLINICAL DECISION MAKING: Evolving/moderate complexity  EVALUATION COMPLEXITY: Moderate   GOALS: Goals reviewed with patient? Yes  SHORT TERM GOALS: Target date: 10/20/2021  Patient will be independent with initial HEP and self-management strategies to improve functional outcomes   Baseline: 09/22/21 - initiated today Goal status: MET  2.  Patient will be able to demonstrate at least 4-/5 for gross L LE strength to improve functional support on L LE and progress function Baseline: 09/22/21 - see objective measures, L hip 2+/5 for hip flexion 8/8 MET see above Goal status: MET  3.  Patient will be able to demonstrate ability to ambulate with equal step length and no pain in order to progress L LE functional strength and W[place].  Baseline: 09/22/21 - decreased stride length left 10/26/21 decreased R step length, decreased L hip extension, slight antalgic on L 8/8 not equal and continued symptoms (12/20/21: see GAIT) Goal status: MET   LONG TERM GOALS: Target date:  11/17/21   Patient will be independent with advanced HEP and self-management strategies to improve functional outcomes   Baseline: 09/22/21 - to be established Goal status: MET  2.  Patient will demonstrate improved functional abilities by improving FOTO by at least 15 points to 47 or higher.    Baseline: 55% function Goal status: MET  3.  Patient will be able to ambulate without AD independently for at least 325 feet during 2 minute walk test in order to access community distance.  Baseline: 290 feet with SPC Goal status: REVISED   4.  Patient will have equal to or > 4+/5 MMT throughout LLE to improve ability to perform functional mobility, stair ambulation and ADLs.  Baseline: See MMT Goal status: NEW   5. Patient will be able to perform stand x 5 in < 11.4 seconds to demonstrate improvement in functional mobility and reduced risk for falls.  Baseline: 12.3 sec with UE assist  Goal status: NEW    PLAN: PT FREQUENCY: 2x/week  PT DURATION: 4 weeks  PLANNED INTERVENTIONS: Therapeutic exercises, Therapeutic activity, Neuromuscular re-education, Balance training, Gait training, Patient/Family education, Joint mobilization, Stair training, Spinal mobilization, Cryotherapy, Moist heat, scar mobilization, Taping, Manual therapy, and Re-evaluation  PLAN FOR NEXT SESSION:  Progress LE strength, balance and gait as tolerated.   Rayetta Humphrey, [place] [phone]  [phone]

## 2022-01-07 ENCOUNTER — Encounter (HOSPITAL_COMMUNITY): Payer: Self-pay | Admitting: Physical Therapy

## 2022-01-07 ENCOUNTER — Ambulatory Visit (HOSPITAL_COMMUNITY): Payer: Medicare Other | Admitting: Physical Therapy

## 2022-01-07 DIAGNOSIS — R2689 Other abnormalities of gait and mobility: Secondary | ICD-10-CM | POA: Diagnosis not present

## 2022-01-07 DIAGNOSIS — R29898 Other symptoms and signs involving the musculoskeletal system: Secondary | ICD-10-CM | POA: Diagnosis not present

## 2022-01-07 DIAGNOSIS — M25652 Stiffness of left hip, not elsewhere classified: Secondary | ICD-10-CM | POA: Diagnosis not present

## 2022-01-07 DIAGNOSIS — M25552 Pain in left hip: Secondary | ICD-10-CM | POA: Diagnosis not present

## 2022-01-07 DIAGNOSIS — R262 Difficulty in walking, not elsewhere classified: Secondary | ICD-10-CM

## 2022-01-07 DIAGNOSIS — M6281 Muscle weakness (generalized): Secondary | ICD-10-CM

## 2022-01-07 NOTE — Therapy (Signed)
OUTPATIENT PHYSICAL THERAPY TREATMENT   Patient Name: Jennifer Porter MRN: [id number] DOB:08/07/1945, 76 y.o., female Today's Date: 01/07/2022      PT End of Session - 01/07/22 1349     Visit Number 19    Number of Visits 22    Date for PT Re-Evaluation 01/17/22    Authorization Type BCBS state - ded met; 30 copay; no coins; no auth needed    Progress Note Due on Visit 47    PT Start Time 1346    PT Stop Time 1430    PT Time Calculation (min) 44 min    Equipment Utilized During Treatment Gait belt    Activity Tolerance Patient tolerated treatment well;Patient limited by fatigue    Behavior During Therapy WFL for tasks assessed/performed                     Past Medical History:  Diagnosis Date   Allergy    seasonal   Asthma    Chronic pancreatitis ([place])    Complication of anesthesia    Diabetes mellitus without complication ([place])    GERD (gastroesophageal reflux disease)    hiatal hernia   H/O: gout    Hyperlipidemia    Hypertension    Pancreatitis    PONV (postoperative nausea and vomiting)    Past Surgical History:  Procedure Laterality Date   ABDOMINAL HYSTERECTOMY     APPENDECTOMY     Arthroscopic Left Knee Left 03/22/2003   CHOLECYSTECTOMY     in [place], 2010, without polyps   ESOPHAGOGASTRODUODENOSCOPY (EGD) WITH PROPOFOL N/A 11/08/2018   Procedure: ESOPHAGOGASTRODUODENOSCOPY (EGD) WITH PROPOFOL;  Surgeon: [name], MD;  Location: [facility];  Service: Endoscopy;  Laterality: N/A;   EUS N/A 11/08/2018   Procedure: UPPER ENDOSCOPIC ULTRASOUND (EUS) RADIAL;  Surgeon: [name], MD;  Location: [facility];  Service: Endoscopy;  Laterality: N/A;   INTRAMEDULLARY (IM) NAIL INTERTROCHANTERIC Left 08/27/2021   Procedure: INTRAMEDULLARY (IM) NAIL INTERTROCHANTRIC;  Surgeon: Shona Needles, MD;  Location: [facility];  Service: Orthopedics;  Laterality: Left;   ORIF WRIST FRACTURE Left 08/27/2021   Procedure: OPEN  REDUCTION INTERNAL FIXATION (ORIF) WRIST FRACTURE;  Surgeon: Shona Needles, MD;  Location: [facility];  Service: Orthopedics;  Laterality: Left;   Patient Active Problem List   Diagnosis Date Noted   Pressure injury of skin 09/12/2021   Trauma 09/01/2021   Subdural hematoma (H[facility])    Subarachnoid hemorrhage (H[facility])    Acute blood loss anemia    Multiple injuries due to trauma 08/26/2021   Colon cancer screening 07/29/2021   Hyperlipidemia associated with type 2 diabetes mellitus ([place]) 11/28/2020   Acute recurrent pansinusitis 07/20/2020   Urinary retention 01/15/2020   Urinary urgency 01/15/2020   Abdominal cramping 08/21/2019   Protein-calorie malnutrition ([place]) 02/18/2019   Chronic pancreatitis ([place]) 08/24/2018   Loose stools 08/24/2018   Pancreatic calcification 08/22/2018   Acute pancreatitis without infection or necrosis 08/09/2018   Sigmoid diverticulosis 08/09/2018   3-vessel CAD 08/09/2018   Aortic atherosclerosis ([place]) 08/09/2018   Myelolipoma of right adrenal gland 08/09/2018   Renal cyst, right 08/09/2018   Hormone replacement therapy 01/12/2017   Hot flashes due to menopause 01/12/2017   Hypomagnesemia 06/27/2016   Diarrhea 03/28/2016   Vitamin D deficiency 10/03/2014   Left knee pain 07/02/2014   Hypertension    Gastroesophageal reflux disease    Type 2 diabetes mellitus without complication ([place])  Allergy    Asthma    H/O: gout    Hyperlipidemia     PCP: Celene Squibb, MD  REFERRING PROVIDER: Cathlyn Parsons, PA-C   and verbal from surgeon Haddix, Thomasene Lot, MD  REFERRING DIAG: Trauma   THERAPY DIAG:  Difficulty in walking, not elsewhere classified  Muscle weakness (generalized)  Pain in left hip  Other abnormalities of gait and mobility  Rationale for Evaluation and Treatment Rehabilitation  ONSET DATE: 08/26/2021 trauma, 08/27/2021 surgery L hip ORIF  SUBJECTIVE:   SUBJECTIVE STATEMENT :   Felt that exercises last time were appropriately  challenging. No new issues since last visit to report. Has ben using her SPC at all times except in her bathroom.   PERTINENT HISTORY: L hip ORIF surgery 08/27/21 L wrist ORIF sugery 08/27/21 L knee arthritis  DM - denies N/T into feet currently, reports intermittently does get these symptoms present before trauma Hx of back fracture - no current back symptoms  Chronic pancreatitis, gallbladder removed, multiple comorbidities  PAIN:  Are you having pain? No   PRECAUTIONS: Fall  WEIGHT BEARING RESTRICTIONS  WBAT L LE - (of note, NWB to L UE being treated by OT but please advise for PT treatments)  FALLS:  Has patient fallen in last 6 months? Yes. Number of falls 1 this current trauma  LIVING ENVIRONMENT: Lives with: lives with their spouse Lives in: House/apartment Stairs: Yes: External: 1 steps; modified with a ramp currently Has following equipment at home: Walker - 2 wheeled and elevated toilet commode seat over regular toilet  OCCUPATION: retired Education officer, [employer]; very active around the house and with husband  PLOF: Independent  PATIENT GOALS "get back to be able to do what I usually do"   OBJECTIVE:   DIAGNOSTIC FINDINGS: Quoted from MD note upon hospital discharge  ''DG [facility] 2-3 VIEWS LEFT  Result Date: 09/15/2021 CLINICAL DATA:  Left hip surgery 3 weeks ago EXAM: DG HIP (WITH OR WITHOUT PELVIS) 2-3V LEFT COMPARISON:  08/27/2021 FINDINGS: Postsurgical changes from left femur ORIF of intertrochanteric left femur fracture. Alignment is unchanged compared to the immediate postoperative images including mildly displaced lesser trochanteric fragment. No new fractures. Hip joint intact without dislocation. Previously seen soft tissue air has resolved. IMPRESSION: Satisfactory appearance of the left hip following recent ORIF. Electronically Signed   By: Davina Poke D.O.   On: 09/15/2021 15:46 "  PATIENT SURVEYS:  FOTO 33  10/26/21 41% function; 55% as of  12/20/21  EDEMA:  Circumferential: ankle circ L 24 cm verse R 23 cm with visibule edema at left foot  POSTURE: forward head, flexed trunk , and weight shift right  PALPATION: TTP at gross left hip into quad, left knee with light touch  LOWER EXTREMITY ROM:  Active ROM Right eval Left eval  Knee flexion WNL WNL  Knee extension WNL WNL  Ankle dorsiflexion WNL WNL  Ankle plantarflexion WNL WNL   (Blank rows = not tested)  LOWER EXTREMITY MMT: eval: modified all testing in sitting secondary to gross trauma/post op comfort 10/26/21:: able to tes tL  hip abduction in partial R sidelying and sitting  MMT Right eval Left eval Right  10/26/21 Left 10/26/21 RT 12/20/21 LT 12/20/21  Hip flexion 4+ 2+ 4+ 4 4+ 4+  Hip extension 4       Hip abduction 4+ 2+ 4+ 4 4+ 4-  Hip adduction 4+ 3      Knee flexion 4+ 3 4+ 4  Knee extension 4+ 3 4+ _0 Ankle dorsiflexion 4+ 3 4+ 4+ 4+ 4+  Ankle plantarflexion 4+ 3       (Blank rows = not tested)  LOWER EXTREMITY SPECIAL TESTS:  Hip special tests: NA secondary to post op   FUNCTIONAL TESTS:  2 minute walk test: 290 feet with notes in below section  GAIT: Distance walked: 290 feet Assistive device utilized: SPC Level of assistance: Supervision  Comments: Decreased stride length   Reassessment 10/26/21 2MWT 200 feet with SPC MMT FOTO  TODAY'S TREATMENT: 01/07/22 Gait with and without assistive device x 100 ft bouts x2 Gait with SPC navigating cones 20' intervals 2RT Side step with blue tband 10' x3 RT Squat with Blue theraband x 15 Marching  2# x 15  Standing B hip extension 2# x 15  Standing B hip abudction 2# x15 Sitting : Sit to stand x 15   01/05/22 Gait without assistive device x 100 ft Side step with blue tband x 2 RT Marching  x 15  Squat with theraband x 15 Step up B 4" x 15  Laterals step up B 4" x 15  Standing B hip extension x 10 Reverse squat B x 10 Tandem stance x 3  Heel raises x 15 Sitting : Sit to  stand x 15 Supine:  Knee to chest hold 1' x 2  Hamstring stretch 1' x 2  Piriformis stretch x 3 for 30 seconds  Marching Bridge x 10  12/29/21 Standing: Side step with blue tband x 2 RT Marching with blue theraband x 15  Squat with theraband x 15 Step up B 6" x 15  Laterals step up B 4" x 15  Standing Lt hip extension x 15  Reverse squat B x 10 Tandem stance x 3  Heel raises x 15 Sitting : LAQ LT  with blue theraband        PATIENT EDUCATION:  Education details: 7/31 probable DOMS 09/22/21 - evaluation findings, POC, PT scope of practice, education on edema management and DVT risk, HEP Person educated: Patient Education method: Explanation, Demonstration, and Handouts Education comprehension: verbalized understanding   HOME EXERCISE PROGRAM: 01/05/22 Access Code: [email or web address] URL: [email or web address] Date: 01/05/2022 Prepared by: Rayetta Humphrey   - Hooklying Active Hamstring Stretch  - 1 x daily - 7 x weekly - 1 sets - 3 reps - one minute hold - Supine Single Knee to Chest Stretch  - 1 x daily - 7 x weekly - 1 sets - 3 reps - 1 minute  hold - Supine Bridge  - 2 x daily - 7 x weekly - 1 sets - 10 reps - 10" hold - Heel Raises with Counter Support  - 2 x daily - 7 x weekly - 1 sets - 10 reps - 5" hold - Mini Squat with Counter Support  - 2 x daily - 7 x weekly - 1 sets - 10 reps - 5" hold - Side Stepping with Resistance at Thighs  - 2 x daily - 7 x weekly - 3 sets - 10 reps Access Code: [email or web address] URL: [email or web address]  10/13/21- Sit to Stand with Arms Crossed  - 1 x daily - 7 x weekly - 2 sets - 10 reps  Date: 09/22/2021 - Seated March  - 2 x daily - 7 x weekly - 2 sets - 10 reps - Seated Isometric Hip Adduction with Ball  - 2 x daily - 7 x weekly - 2 sets - 10 reps -  Seated Single Leg Hip Abduction  - 2 x daily - 7 x weekly - 2 sets - 10 reps 09/24/21 add red theraband - Seated Heel Toe Raises  - 3-5 x daily - 7 x weekly - 2 sets - 10  reps  ASSESSMENT:  CLINICAL IMPRESSION: Further gait training with dynamic challenges, focused on awareness of compensatory mechanics and how to improve symmetry. Progressed to 1 hand sit<>stand but needs cues to monitor and correct Lt knee valgus collapse. Similar cues with mini squats, using light UE support to help stability. Patient will continue to benefit from skilled physical therapy services to further improve independence with functional mobility.   OBJECTIVE IMPAIRMENTS Abnormal gait, decreased activity tolerance, decreased balance, decreased endurance, decreased mobility, difficulty walking, decreased strength, increased edema, impaired UE functional use, postural dysfunction, and pain.   ACTIVITY LIMITATIONS carrying, lifting, bending, standing, squatting, stairs, bathing, toileting, locomotion level, and caring for others  PARTICIPATION LIMITATIONS: meal prep, cleaning, laundry, driving, community activity, and yard work  PERSONAL FACTORS Age, Transportation, and 3+ comorbidities: DM, L UE trauma, L knee arthritis/pain chronic  are also affecting patient's functional outcome.   REHAB POTENTIAL: Good  CLINICAL DECISION MAKING: Evolving/moderate complexity  EVALUATION COMPLEXITY: Moderate   GOALS: Goals reviewed with patient? Yes  SHORT TERM GOALS: Target date: 10/20/2021  Patient will be independent with initial HEP and self-management strategies to improve functional outcomes   Baseline: 09/22/21 - initiated today Goal status: MET  2.  Patient will be able to demonstrate at least 4-/5 for gross L LE strength to improve functional support on L LE and progress function Baseline: 09/22/21 - see objective measures, L hip 2+/5 for hip flexion 8/8 MET see above Goal status: MET  3.  Patient will be able to demonstrate ability to ambulate with equal step length and no pain in order to progress L LE functional strength and W[place].  Baseline: 09/22/21 - decreased stride length left  10/26/21 decreased R step length, decreased L hip extension, slight antalgic on L 8/8 not equal and continued symptoms (12/20/21: see GAIT) Goal status: MET   LONG TERM GOALS: Target date: 11/17/21   Patient will be independent with advanced HEP and self-management strategies to improve functional outcomes   Baseline: 09/22/21 - to be established Goal status: MET  2.  Patient will demonstrate improved functional abilities by improving FOTO by at least 15 points to 47 or higher.    Baseline: 55% function Goal status: MET  3.  Patient will be able to ambulate without AD independently for at least 325 feet during 2 minute walk test in order to access community distance.  Baseline: 290 feet with SPC Goal status: REVISED   4.  Patient will have equal to or > 4+/5 MMT throughout LLE to improve ability to perform functional mobility, stair ambulation and ADLs.  Baseline: See MMT Goal status: NEW   5. Patient will be able to perform stand x 5 in < 11.4 seconds to demonstrate improvement in functional mobility and reduced risk for falls.  Baseline: 12.3 sec with UE assist  Goal status: NEW    PLAN: PT FREQUENCY: 2x/week  PT DURATION: 4 weeks  PLANNED INTERVENTIONS: Therapeutic exercises, Therapeutic activity, Neuromuscular re-education, Balance training, Gait training, Patient/Family education, Joint mobilization, Stair training, Spinal mobilization, Cryotherapy, Moist heat, scar mobilization, Taping, Manual therapy, and Re-evaluation  PLAN FOR NEXT SESSION:  Progress LE strength, balance and gait as tolerated.     Candie Mile, PT, DPT Physical Therapist Acute  [facility]

## 2022-01-12 ENCOUNTER — Ambulatory Visit (HOSPITAL_COMMUNITY): Payer: Medicare Other | Admitting: Physical Therapy

## 2022-01-12 DIAGNOSIS — R2689 Other abnormalities of gait and mobility: Secondary | ICD-10-CM

## 2022-01-12 DIAGNOSIS — R262 Difficulty in walking, not elsewhere classified: Secondary | ICD-10-CM | POA: Diagnosis not present

## 2022-01-12 DIAGNOSIS — M25552 Pain in left hip: Secondary | ICD-10-CM

## 2022-01-12 DIAGNOSIS — R29898 Other symptoms and signs involving the musculoskeletal system: Secondary | ICD-10-CM

## 2022-01-12 DIAGNOSIS — M6281 Muscle weakness (generalized): Secondary | ICD-10-CM

## 2022-01-12 DIAGNOSIS — M25652 Stiffness of left hip, not elsewhere classified: Secondary | ICD-10-CM | POA: Diagnosis not present

## 2022-01-12 NOTE — Therapy (Signed)
OUTPATIENT PHYSICAL THERAPY TREATMENT   Patient Name: Jennifer Porter MRN: [id number] DOB:18-Aug-1945, 76 y.o., female Today's Date: 01/12/2022      PT End of Session - 01/12/22 1643     Visit Number 20    Number of Visits 22    Date for PT Re-Evaluation 01/17/22    Authorization Type BCBS state - ded met; 30 copay; no coins; no auth needed    Progress Note Due on Visit 24    PT Start Time 1601    PT Stop Time 1642    PT Time Calculation (min) 41 min    Equipment Utilized During Treatment Gait belt    Activity Tolerance Patient tolerated treatment well;Patient limited by fatigue    Behavior During Therapy WFL for tasks assessed/performed                      Past Medical History:  Diagnosis Date   Allergy    seasonal   Asthma    Chronic pancreatitis ([place])    Complication of anesthesia    Diabetes mellitus without complication ([place])    GERD (gastroesophageal reflux disease)    hiatal hernia   H/O: gout    Hyperlipidemia    Hypertension    Pancreatitis    PONV (postoperative nausea and vomiting)    Past Surgical History:  Procedure Laterality Date   ABDOMINAL HYSTERECTOMY     APPENDECTOMY     Arthroscopic Left Knee Left 03/22/2003   CHOLECYSTECTOMY     in [place], 2010, without polyps   ESOPHAGOGASTRODUODENOSCOPY (EGD) WITH PROPOFOL N/A 11/08/2018   Procedure: ESOPHAGOGASTRODUODENOSCOPY (EGD) WITH PROPOFOL;  Surgeon: [name], MD;  Location: [facility];  Service: Endoscopy;  Laterality: N/A;   EUS N/A 11/08/2018   Procedure: UPPER ENDOSCOPIC ULTRASOUND (EUS) RADIAL;  Surgeon: [name], MD;  Location: [facility];  Service: Endoscopy;  Laterality: N/A;   INTRAMEDULLARY (IM) NAIL INTERTROCHANTERIC Left 08/27/2021   Procedure: INTRAMEDULLARY (IM) NAIL INTERTROCHANTRIC;  Surgeon: Shona Needles, MD;  Location: [facility];  Service: Orthopedics;  Laterality: Left;   ORIF WRIST FRACTURE Left 08/27/2021   Procedure:  OPEN REDUCTION INTERNAL FIXATION (ORIF) WRIST FRACTURE;  Surgeon: Shona Needles, MD;  Location: [facility];  Service: Orthopedics;  Laterality: Left;   Patient Active Problem List   Diagnosis Date Noted   Pressure injury of skin 09/12/2021   Trauma 09/01/2021   Subdural hematoma (H[facility])    Subarachnoid hemorrhage (HCC)    Acute blood loss anemia    Multiple injuries due to trauma 08/26/2021   Colon cancer screening 07/29/2021   Hyperlipidemia associated with type 2 diabetes mellitus ([place]) 11/28/2020   Acute recurrent pansinusitis 07/20/2020   Urinary retention 01/15/2020   Urinary urgency 01/15/2020   Abdominal cramping 08/21/2019   Protein-calorie malnutrition ([place]) 02/18/2019   Chronic pancreatitis ([place]) 08/24/2018   Loose stools 08/24/2018   Pancreatic calcification 08/22/2018   Acute pancreatitis without infection or necrosis 08/09/2018   Sigmoid diverticulosis 08/09/2018   3-vessel CAD 08/09/2018   Aortic atherosclerosis ([place]) 08/09/2018   Myelolipoma of right adrenal gland 08/09/2018   Renal cyst, right 08/09/2018   Hormone replacement therapy 01/12/2017   Hot flashes due to menopause 01/12/2017   Hypomagnesemia 06/27/2016   Diarrhea 03/28/2016   Vitamin D deficiency 10/03/2014   Left knee pain 07/02/2014   Hypertension    Gastroesophageal reflux disease    Type 2 diabetes mellitus without complication (  [place])    Allergy    Asthma    H/O: gout    Hyperlipidemia     PCP: Celene Squibb, MD  REFERRING PROVIDER: Cathlyn Parsons, PA-C   and verbal from surgeon Haddix, Thomasene Lot, MD  REFERRING DIAG: Trauma   THERAPY DIAG:  Difficulty in walking, not elsewhere classified  Pain in left hip  Muscle weakness (generalized)  Other abnormalities of gait and mobility  Other symptoms and signs involving the musculoskeletal system  Rationale for Evaluation and Treatment Rehabilitation  ONSET DATE: 08/26/2021 trauma, 08/27/2021 surgery L hip ORIF  SUBJECTIVE:    SUBJECTIVE STATEMENT :   Pt states that she went to the grocery store for 30 minutes, walked into the hospital and came upstairs therefore she is having some pain in her Lt hip. PERTINENT HISTORY: L hip ORIF surgery 08/27/21 L wrist ORIF sugery 08/27/21 L knee arthritis  DM - denies N/T into feet currently, reports intermittently does get these symptoms present before trauma Hx of back fracture - no current back symptoms  Chronic pancreatitis, gallbladder removed, multiple comorbidities  PAIN:  Are you having pain? No and Yes: NPRS scale: 3/10 Pain location: Lt hip a Pain description: aching Aggravating factors: prolong wb Relieving factors: rest   PRECAUTIONS: Fall  WEIGHT BEARING RESTRICTIONS  WBAT L LE - (of note, NWB to L UE being treated by OT but please advise for PT treatments)  FALLS:  Has patient fallen in last 6 months? Yes. Number of falls 1 this current trauma  LIVING ENVIRONMENT: Lives with: lives with their spouse Lives in: House/apartment Stairs: Yes: External: 1 steps; modified with a ramp currently Has following equipment at home: Walker - 2 wheeled and elevated toilet commode seat over regular toilet  OCCUPATION: retired Education officer, [employer]; very active around the house and with husband  PLOF: Independent  PATIENT GOALS "get back to be able to do what I usually do"   OBJECTIVE:   DIAGNOSTIC FINDINGS: Quoted from MD note upon hospital discharge  ''DG [facility] 2-3 VIEWS LEFT  Result Date: 09/15/2021 CLINICAL DATA:  Left hip surgery 3 weeks ago EXAM: DG HIP (WITH OR WITHOUT PELVIS) 2-3V LEFT COMPARISON:  08/27/2021 FINDINGS: Postsurgical changes from left femur ORIF of intertrochanteric left femur fracture. Alignment is unchanged compared to the immediate postoperative images including mildly displaced lesser trochanteric fragment. No new fractures. Hip joint intact without dislocation. Previously seen soft tissue air has resolved. IMPRESSION:  Satisfactory appearance of the left hip following recent ORIF. Electronically Signed   By: Davina Poke D.O.   On: 09/15/2021 15:46 "  PATIENT SURVEYS:  FOTO 33  10/26/21 41% function; 55% as of 12/20/21  EDEMA:  Circumferential: ankle circ L 24 cm verse R 23 cm with visibule edema at left foot  POSTURE: forward head, flexed trunk , and weight shift right  PALPATION: TTP at gross left hip into quad, left knee with light touch  LOWER EXTREMITY ROM:  Active ROM Right eval Left eval  Knee flexion WNL WNL  Knee extension WNL WNL  Ankle dorsiflexion WNL WNL  Ankle plantarflexion WNL WNL   (Blank rows = not tested)  LOWER EXTREMITY MMT: eval: modified all testing in sitting secondary to gross trauma/post op comfort 10/26/21:: able to tes tL  hip abduction in partial R sidelying and sitting  MMT Right eval Left eval Right  10/26/21 Left 10/26/21 RT 12/20/21 LT 12/20/21  Hip flexion 4+ 2+ 4+ 4 4+ 4+  Hip  extension 4       Hip abduction 4+ 2+ 4+ 4 4+ 4-  Hip adduction 4+ 3      Knee flexion 4+ 3 4+ 4    Knee extension 4+ 3 4+ _0 Ankle dorsiflexion 4+ 3 4+ 4+ 4+ 4+  Ankle plantarflexion 4+ 3       (Blank rows = not tested)  LOWER EXTREMITY SPECIAL TESTS:  Hip special tests: NA secondary to post op   FUNCTIONAL TESTS:  2 minute walk test: 290 feet with notes in below section  GAIT: Distance walked: 290 feet Assistive device utilized: SPC Level of assistance: Supervision  Comments: Decreased stride length   Reassessment 10/26/21 2MWT 200 feet with SPC MMT FOTO  TODAY'S TREATMENT: 10/25: Marching x 15 Heel raise with squat x 15\ Single leg stance B x 5 Side stepping with blue theraband x 2 rt Retro gait x 2 RT  Step up 6" x 10 B Weight shifting onto forward foot as if pt is taking a step. Lunges onto 6" step 10 B  Sitting: Sit to stand x 15  01/07/22 Gait with and without assistive device x 100 ft bouts x2 Gait with SPC navigating cones 20' intervals 2RT Side  step with blue tband 10' x3 RT Squat with Blue theraband x 15 Marching  2# x 15  Standing B hip extension 2# x 15  Standing B hip abudction 2# x15 Sitting : Sit to stand x 15   01/05/22 Gait without assistive device x 100 ft Side step with blue tband x 2 RT Marching  x 15  Squat with theraband x 15 Step up B 4" x 15  Laterals step up B 4" x 15  Standing B hip extension x 10 Reverse squat B x 10 Tandem stance x 3  Heel raises x 15 Sitting : Sit to stand x 15 Supine:  Knee to chest hold 1' x 2  Hamstring stretch 1' x 2  Piriformis stretch x 3 for 30 seconds  Marching Bridge x 10  12/29/21 Standing: Side step with blue tband x 2 RT Marching with blue theraband x 15  Squat with theraband x 15 Step up B 6" x 15  Laterals step up B 4" x 15  Standing Lt hip extension x 15  Reverse squat B x 10 Tandem stance x 3  Heel raises x 15 Sitting : LAQ LT  with blue theraband        PATIENT EDUCATION:  Education details: 7/31 probable DOMS 09/22/21 - evaluation findings, POC, PT scope of practice, education on edema management and DVT risk, HEP Person educated: Patient Education method: Explanation, Demonstration, and Handouts Education comprehension: verbalized understanding   HOME EXERCISE PROGRAM: 01/05/22 Access Code: [email or web address] URL: [email or web address] Date: 01/05/2022 Prepared by: Rayetta Humphrey   - Hooklying Active Hamstring Stretch  - 1 x daily - 7 x weekly - 1 sets - 3 reps - one minute hold - Supine Single Knee to Chest Stretch  - 1 x daily - 7 x weekly - 1 sets - 3 reps - 1 minute  hold - Supine Bridge  - 2 x daily - 7 x weekly - 1 sets - 10 reps - 10" hold - Heel Raises with Counter Support  - 2 x daily - 7 x weekly - 1 sets - 10 reps - 5" hold - Mini Squat with Counter Support  - 2 x daily - 7 x weekly - 1 sets - 10  reps - 5" hold - Side Stepping with Resistance at Thighs  - 2 x daily - 7 x weekly - 3 sets - 10 reps Access Code: [email or web address] URL:  [email or web address]  10/13/21- Sit to Stand with Arms Crossed  - 1 x daily - 7 x weekly - 2 sets - 10 reps  Date: 09/22/2021 - Seated March  - 2 x daily - 7 x weekly - 2 sets - 10 reps - Seated Isometric Hip Adduction with Ball  - 2 x daily - 7 x weekly - 2 sets - 10 reps - Seated Single Leg Hip Abduction  - 2 x daily - 7 x weekly - 2 sets - 10 reps 09/24/21 add red theraband - Seated Heel Toe Raises  - 3-5 x daily - 7 x weekly - 2 sets - 10 reps  ASSESSMENT:  CLINICAL IMPRESSION:Pt continues to need verbal encouragement to push herself physically, fx was over 4 months ago.  Pt needs verbal cuing on balancing exercises to shift wt with hips to keep in balance.   Patient will continue to benefit from skilled physical therapy services to further improve independence with functional mobility.   OBJECTIVE IMPAIRMENTS Abnormal gait, decreased activity tolerance, decreased balance, decreased endurance, decreased mobility, difficulty walking, decreased strength, increased edema, impaired UE functional use, postural dysfunction, and pain.   ACTIVITY LIMITATIONS carrying, lifting, bending, standing, squatting, stairs, bathing, toileting, locomotion level, and caring for others  PARTICIPATION LIMITATIONS: meal prep, cleaning, laundry, driving, community activity, and yard work  PERSONAL FACTORS Age, Transportation, and 3+ comorbidities: DM, L UE trauma, L knee arthritis/pain chronic  are also affecting patient's functional outcome.   REHAB POTENTIAL: Good  CLINICAL DECISION MAKING: Evolving/moderate complexity  EVALUATION COMPLEXITY: Moderate   GOALS: Goals reviewed with patient? Yes  SHORT TERM GOALS: Target date: 10/20/2021  Patient will be independent with initial HEP and self-management strategies to improve functional outcomes   Baseline: 09/22/21 - initiated today Goal status: MET  2.  Patient will be able to demonstrate at least 4-/5 for gross L LE strength to improve  functional support on L LE and progress function Baseline: 09/22/21 - see objective measures, L hip 2+/5 for hip flexion 8/8 MET see above Goal status: MET  3.  Patient will be able to demonstrate ability to ambulate with equal step length and no pain in order to progress L LE functional strength and W[place].  Baseline: 09/22/21 - decreased stride length left 10/26/21 decreased R step length, decreased L hip extension, slight antalgic on L 8/8 not equal and continued symptoms (12/20/21: see GAIT) Goal status: MET   LONG TERM GOALS: Target date: 11/17/21   Patient will be independent with advanced HEP and self-management strategies to improve functional outcomes   Baseline: 09/22/21 - to be established Goal status: MET  2.  Patient will demonstrate improved functional abilities by improving FOTO by at least 15 points to 47 or higher.    Baseline: 55% function Goal status: MET  3.  Patient will be able to ambulate without AD independently for at least 325 feet during 2 minute walk test in order to access community distance.  Baseline: 290 feet with SPC Goal status: REVISED   4.  Patient will have equal to or > 4+/5 MMT throughout LLE to improve ability to perform functional mobility, stair ambulation and ADLs.  Baseline: See MMT Goal status: NEW   5. Patient will be able to perform stand x 5 in < 11.4 seconds to demonstrate improvement  in functional mobility and reduced risk for falls.  Baseline: 12.3 sec with UE assist  Goal status: NEW    PLAN: PT FREQUENCY: 2x/week  PT DURATION: 4 weeks  PLANNED INTERVENTIONS: Therapeutic exercises, Therapeutic activity, Neuromuscular re-education, Balance training, Gait training, Patient/Family education, Joint mobilization, Stair training, Spinal mobilization, Cryotherapy, Moist heat, scar mobilization, Taping, Manual therapy, and Re-evaluation  PLAN FOR NEXT SESSION: Continue to  Progress LE strength, balance and gait as tolerated.   Rayetta Humphrey,  [place] [phone]  [phone]

## 2022-01-14 ENCOUNTER — Ambulatory Visit (HOSPITAL_COMMUNITY): Payer: Medicare Other | Admitting: Physical Therapy

## 2022-01-14 DIAGNOSIS — R262 Difficulty in walking, not elsewhere classified: Secondary | ICD-10-CM

## 2022-01-14 DIAGNOSIS — M6281 Muscle weakness (generalized): Secondary | ICD-10-CM

## 2022-01-14 DIAGNOSIS — R29898 Other symptoms and signs involving the musculoskeletal system: Secondary | ICD-10-CM

## 2022-01-14 DIAGNOSIS — M25652 Stiffness of left hip, not elsewhere classified: Secondary | ICD-10-CM | POA: Diagnosis not present

## 2022-01-14 DIAGNOSIS — R2689 Other abnormalities of gait and mobility: Secondary | ICD-10-CM | POA: Diagnosis not present

## 2022-01-14 DIAGNOSIS — M25552 Pain in left hip: Secondary | ICD-10-CM | POA: Diagnosis not present

## 2022-01-14 NOTE — Therapy (Signed)
OUTPATIENT PHYSICAL THERAPY TREATMENT   Patient Name: Jennifer Porter MRN: [id number] DOB:03-26-1945, 76 y.o., female Today's Date: 01/14/2022 PHYSICAL THERAPY DISCHARGE SUMMARY  Visits from Start of Care: 21  Current functional level related to goals / functional outcomes: See below   Remaining deficits: Balance; hip extension and hamstring strength   Education / Equipment: HEP   Patient agrees to discharge. Patient goals were partially met. Patient is being discharged due to being pleased with the current functional level.      PT End of Session - 01/14/22 1033     Visit Number 21    Number of Visits 22    Date for PT Re-Evaluation 01/17/22    Authorization Type BCBS state - ded met; 30 copay; no coins; no auth needed    Progress Note Due on Visit 21    PT Start Time 0950    PT Stop Time 1030    PT Time Calculation (min) 40 min    Equipment Utilized During Treatment Gait belt    Activity Tolerance Patient tolerated treatment well;Patient limited by fatigue    Behavior During Therapy WFL for tasks assessed/performed                       Past Medical History:  Diagnosis Date   Allergy    seasonal   Asthma    Chronic pancreatitis ([place])    Complication of anesthesia    Diabetes mellitus without complication ([place])    GERD (gastroesophageal reflux disease)    hiatal hernia   H/O: gout    Hyperlipidemia    Hypertension    Pancreatitis    PONV (postoperative nausea and vomiting)    Past Surgical History:  Procedure Laterality Date   ABDOMINAL HYSTERECTOMY     APPENDECTOMY     Arthroscopic Left Knee Left 03/22/2003   CHOLECYSTECTOMY     in [place], 2010, without polyps   ESOPHAGOGASTRODUODENOSCOPY (EGD) WITH PROPOFOL N/A 11/08/2018   Procedure: ESOPHAGOGASTRODUODENOSCOPY (EGD) WITH PROPOFOL;  Surgeon: [name], MD;  [facility];  Service: Endoscopy;  Laterality: N/A;   EUS N/A 11/08/2018    Procedure: UPPER ENDOSCOPIC ULTRASOUND (EUS) RADIAL;  Surgeon: [name], MD;  [facility]: [facility];  Service: Endoscopy;  Laterality: N/A;   INTRAMEDULLARY (IM) NAIL INTERTROCHANTERIC Left 08/27/2021   Procedure: INTRAMEDULLARY (IM) NAIL INTERTROCHANTRIC;  Surgeon: Shona Needles, MD;  Location: [facility];  Service: Orthopedics;  Laterality: Left;   ORIF WRIST FRACTURE Left 08/27/2021   Procedure: OPEN REDUCTION INTERNAL FIXATION (ORIF) WRIST FRACTURE;  Surgeon: Shona Needles, MD;  Location: [facility];  Service: Orthopedics;  Laterality: Left;   Patient Active Problem List   Diagnosis Date Noted   Pressure injury of skin 09/12/2021   Trauma 09/01/2021   Subdural hematoma (HCC)    Subarachnoid hemorrhage (HCC)    Acute blood loss anemia    Multiple injuries due to trauma 08/26/2021   Colon cancer screening 07/29/2021   Hyperlipidemia associated with type 2 diabetes mellitus ([place]) 11/28/2020   Acute recurrent pansinusitis 07/20/2020   Urinary retention 01/15/2020   Urinary urgency 01/15/2020   Abdominal cramping 08/21/2019   Protein-calorie malnutrition ([place]) 02/18/2019   Chronic pancreatitis ([place]) 08/24/2018   Loose stools 08/24/2018   Pancreatic calcification 08/22/2018   Acute pancreatitis without infection or necrosis 08/09/2018   Sigmoid diverticulosis 08/09/2018   3-vessel CAD 08/09/2018   Aortic atherosclerosis ([place]) 08/09/2018  Myelolipoma of right adrenal gland 08/09/2018   Renal cyst, right 08/09/2018   Hormone replacement therapy 01/12/2017   Hot flashes due to menopause 01/12/2017   Hypomagnesemia 06/27/2016   Diarrhea 03/28/2016   Vitamin D deficiency 10/03/2014   Left knee pain 07/02/2014   Hypertension    Gastroesophageal reflux disease    Type 2 diabetes mellitus without complication ([place])    Allergy    Asthma    H/O: gout    Hyperlipidemia     PCP: Celene Squibb, MD  REFERRING PROVIDER: Cathlyn Parsons, PA-C   and verbal from surgeon Haddix, Thomasene Lot,  MD  REFERRING DIAG: Trauma   THERAPY DIAG:  Difficulty in walking, not elsewhere classified  Pain in left hip  Muscle weakness (generalized)  Other abnormalities of gait and mobility  Other symptoms and signs involving the musculoskeletal system  Rationale for Evaluation and Treatment Rehabilitation  ONSET DATE: 08/26/2021 trauma, 08/27/2021 surgery L hip ORIF  SUBJECTIVE:   SUBJECTIVE STATEMENT :   Pt states that she feels that she is about 70% better prior to therapy.  She is walking around her house without her can.  PERTINENT HISTORY: L hip ORIF surgery 08/27/21 L wrist ORIF sugery 08/27/21 L knee arthritis  DM - denies N/T into feet currently, reports intermittently does get these symptoms present before trauma Hx of back fracture - no current back symptoms  Chronic pancreatitis, gallbladder removed, multiple comorbidities  PAIN:  Are you having pain? No and Yes: NPRS scale: 3/10 Pain location: Lt hip a Pain description: aching Aggravating factors: prolong wb Relieving factors: rest   PRECAUTIONS: Fall  WEIGHT BEARING RESTRICTIONS  WBAT L LE - (of note, NWB to L UE being treated by OT but please advise for PT treatments)  FALLS:  Has patient fallen in last 6 months? Yes. Number of falls 1 this current trauma  LIVING ENVIRONMENT: Lives with: lives with their spouse Lives in: House/apartment Stairs: Yes: External: 1 steps; modified with a ramp currently Has following equipment at home: Walker - 2 wheeled and elevated toilet commode seat over regular toilet  OCCUPATION: retired Education officer, museum; very active around the house and with husband  PLOF: Independent  PATIENT GOALS "get back to be able to do what I usually do"   OBJECTIVE:   DIAGNOSTIC FINDINGS: Quoted from MD note upon hospital discharge  ''DG [place] 2-3 VIEWS LEFT  Result Date: 09/15/2021 CLINICAL DATA:  Left hip surgery 3 weeks ago EXAM: DG HIP (WITH OR WITHOUT PELVIS) 2-3V LEFT  COMPARISON:  08/27/2021 FINDINGS: Postsurgical changes from left femur ORIF of intertrochanteric left femur fracture. Alignment is unchanged compared to the immediate postoperative images including mildly displaced lesser trochanteric fragment. No new fractures. Hip joint intact without dislocation. Previously seen soft tissue air has resolved. IMPRESSION: Satisfactory appearance of the left hip following recent ORIF. Electronically Signed   By: Davina Poke D.O.   On: 09/15/2021 15:46 "  PATIENT SURVEYS:  FOTO 33  10/26/21 41% function; 55% as of 12/20/21,01/14/22 62.7%    EDEMA:  Circumferential: ankle circ L 24 cm verse R 23 cm with visibule edema at left foot  POSTURE: forward head, flexed trunk , and weight shift right  PALPATION: TTP at gross left hip into quad, left knee with light touch  LOWER EXTREMITY ROM:  Active ROM Right eval Left eval  Knee flexion WNL WNL  Knee extension WNL WNL  Ankle dorsiflexion WNL WNL  Ankle plantarflexion WNL WNL   (  Blank rows = not tested)  LOWER EXTREMITY MMT: eval: modified all testing in sitting secondary to gross trauma/post op comfort 10/26/21:: able to tes tL  hip abduction in partial R sidelying and sitting  MMT Right eval Left eval Right  10/26/21 Left 10/26/21 RT 12/20/21 Rt 01/14/22 LT 12/20/21 Lt 10/27  Hip flexion 4+ 2+ 4+ 4 4+ 5- 4+ 5  Hip extension 4     3  3-  Hip abduction 4+ 2+ 4+ 4 4+ 5- 4- 4  Hip adduction 4+ 3        Knee flexion 4+ 3 4+ 4  3+  3+  Knee extension 4+ 3 4+ _0 Ankle dorsiflexion 4+ 3 4+ 4+ 4+ 4+ 4+ 4+  Ankle plantarflexion 4+ 3         (Blank rows = not tested)  LOWER EXTREMITY SPECIAL TESTS:  Hip special tests: NA secondary to post op   FUNCTIONAL TESTS:           01/14/22           2 minute walk test with cane: 316 ft  in 2 minutes.            30 second sit to stand:  10 using UE on chair but not on arms of chair. Between average and below average for age           Single leg stance:  Rt:  unable    , Lt:   unable   12/20/21 2 minute walk test: 290 feet with notes in below section  GAIT: Distance walked: 290 feet Assistive device utilized: SPC Level of assistance: Supervision  Comments: Decreased stride length   Reassessment 10/26/21 2MWT 200 feet with SPC MMT FOTO  TODAY'S TREATMENT: 10/25: Marching x 15 Heel raise with squat x 15\ Single leg stance B x 5 Side stepping with blue theraband x 2 rt Retro gait x 2 RT  Step up 6" x 10 B Weight shifting onto forward foot as if pt is taking a step. Lunges onto 6" step 10 B  Sitting: Sit to stand x 15  01/07/22 Gait with and without assistive device x 100 ft bouts x2 Gait with SPC navigating cones 20' intervals 2RT Side step with blue tband 10' x3 RT Squat with Blue theraband x 15 Marching  2# x 15  Standing B hip extension 2# x 15  Standing B hip abudction 2# x15 Sitting : Sit to stand x 15   01/05/22 Gait without assistive device x 100 ft Side step with blue tband x 2 RT Marching  x 15  Squat with theraband x 15 Step up B 4" x 15  Laterals step up B 4" x 15  Standing B hip extension x 10 Reverse squat B x 10 Tandem stance x 3  Heel raises x 15 Sitting : Sit to stand x 15 Supine:  Knee to chest hold 1' x 2  Hamstring stretch 1' x 2  Piriformis stretch x 3 for 30 seconds  Marching Bridge x 10  12/29/21 Standing: Side step with blue tband x 2 RT Marching with blue theraband x 15  Squat with theraband x 15 Step up B 6" x 15  Laterals step up B 4" x 15  Standing Lt hip extension x 15  Reverse squat B x 10 Tandem stance x 3  Heel raises x 15 Sitting : LAQ LT  with blue theraband  PATIENT EDUCATION:  Education details: 7/31 probable DOMS 09/22/21 - evaluation findings, POC, PT scope of practice, education on edema management and DVT risk, HEP Person educated: Patient Education method: Explanation, Demonstration, and Handouts Education comprehension: verbalized understanding   HOME  EXERCISE PROGRAM: 01/14/22: - Prone Hamstring Curl with Anchored Resistance  - 2 x daily - 7 x weekly - 1 sets - 10 reps - 3" hold - Prone Hip Extension  - 2 x daily - 7 x weekly - 1 sets - 10 reps - 3" hold 01/05/22 Access Code: [email or web address] URL: [email or web address] Date: 01/05/2022 Prepared by: Rayetta Humphrey   - Hooklying Active Hamstring Stretch  - 1 x daily - 7 x weekly - 1 sets - 3 reps - one minute hold - Supine Single Knee to Chest Stretch  - 1 x daily - 7 x weekly - 1 sets - 3 reps - 1 minute  hold - Supine Bridge  - 2 x daily - 7 x weekly - 1 sets - 10 reps - 10" hold - Heel Raises with Counter Support  - 2 x daily - 7 x weekly - 1 sets - 10 reps - 5" hold - Mini Squat with Counter Support  - 2 x daily - 7 x weekly - 1 sets - 10 reps - 5" hold - Side Stepping with Resistance at Thighs  - 2 x daily - 7 x weekly - 3 sets - 10 reps Access Code: [email or web address] URL: [email or web address]  10/13/21- Sit to Stand with Arms Crossed  - 1 x daily - 7 x weekly - 2 sets - 10 reps  Date: 09/22/2021 - Seated March  - 2 x daily - 7 x weekly - 2 sets - 10 reps - Seated Isometric Hip Adduction with Ball  - 2 x daily - 7 x weekly - 2 sets - 10 reps - Seated Single Leg Hip Abduction  - 2 x daily - 7 x weekly - 2 sets - 10 reps 09/24/21 add red theraband - Seated Heel Toe Raises  - 3-5 x daily - 7 x weekly - 2 sets - 10 reps  ASSESSMENT:  CLINICAL IMPRESSION:Pt reassessed today and has progressed but still has significant decreased balance which pt feels she had before the fall and decreased strength in her hip extensors  and knee flexion B which is affecting her gait.  Pt is I in HEP at this time and feels that she can continue with a HEP at home.  Pt is ready for discharge.   OBJECTIVE IMPAIRMENTS Abnormal gait, decreased activity tolerance, decreased balance, decreased endurance, decreased mobility, difficulty walking, decreased strength, increased edema, impaired UE  functional use, postural dysfunction, and pain.   ACTIVITY LIMITATIONS carrying, lifting, bending, standing, squatting, stairs, bathing, toileting, locomotion level, and caring for others  PARTICIPATION LIMITATIONS: meal prep, cleaning, laundry, driving, community activity, and yard work  PERSONAL FACTORS Age, Transportation, and 3+ comorbidities: DM, L UE trauma, L knee arthritis/pain chronic  are also affecting patient's functional outcome.   REHAB POTENTIAL: Good  CLINICAL DECISION MAKING: Evolving/moderate complexity  EVALUATION COMPLEXITY: Moderate   GOALS: Goals reviewed with patient? Yes  SHORT TERM GOALS: Target date: 10/20/2021  Patient will be independent with initial HEP and self-management strategies to improve functional outcomes   Baseline: 09/22/21 - initiated today Goal status: MET  2.  Patient will be able to demonstrate at least 4-/5 for gross L LE strength to improve functional support on L LE and progress function Baseline: 09/22/21 -  see objective measures, L hip 2+/5 for hip flexion 8/8 MET see above Goal status: MET  3.  Patient will be able to demonstrate ability to ambulate with equal step length and no pain in order to progress L LE functional strength and Wbing.  Baseline: 09/22/21 - decreased stride length left 10/26/21 decreased R step length, decreased L hip extension, slight antalgic on L 8/8 not equal and continued symptoms (12/20/21: see GAIT) Goal status: MET   LONG TERM GOALS: Target date: 11/17/21   Patient will be independent with advanced HEP and self-management strategies to improve functional outcomes   Baseline: 09/22/21 - to be established Goal status: MET  2.  Patient will demonstrate improved functional abilities by improving FOTO by at least 15 points to 47 or higher.    Baseline: 55% function Goal status: MET  3.  Patient will be able to ambulate without AD independently for at least 325 feet during 2 minute walk test in order to access  community distance.  Baseline: 290 feet with SPC Goal status: REVISED 10/27:  currently 318'  4.  Patient will have equal to or > 4+/5 MMT throughout LLE to improve ability to perform functional mobility, stair ambulation and ADLs.  Baseline: See MMT Goal status: partially met see above   5. Patient will be able to perform stand x 5 in < 11.4 seconds to demonstrate improvement in functional mobility and reduced risk for falls.  Baseline: 12.3 sec with UE assist  Goal status: ongoing 01/14/22:  13..28    PLAN: PT FREQUENCY: 2x/week  PT DURATION: 4 weeks  PLANNED INTERVENTIONS: Therapeutic exercises, Therapeutic activity, Neuromuscular re-education, Balance training, Gait training, Patient/Family education, Joint mobilization, Stair training, Spinal mobilization, Cryotherapy, Moist heat, scar mobilization, Taping, Manual therapy, and Re-evaluation  PLAN FOR NEXT SESSION: Discharge  Rayetta Humphrey, [place] [phone]  1032 AM

## 2022-01-17 ENCOUNTER — Encounter (HOSPITAL_COMMUNITY): Payer: BC Managed Care – PPO

## 2022-01-19 ENCOUNTER — Encounter (HOSPITAL_COMMUNITY): Payer: BC Managed Care – PPO | Admitting: Physical Therapy

## 2022-01-24 ENCOUNTER — Other Ambulatory Visit: Payer: Self-pay | Admitting: Gastroenterology

## 2022-02-08 DIAGNOSIS — S72142D Displaced intertrochanteric fracture of left femur, subsequent encounter for closed fracture with routine healing: Secondary | ICD-10-CM | POA: Diagnosis not present

## 2022-04-05 DIAGNOSIS — M1712 Unilateral primary osteoarthritis, left knee: Secondary | ICD-10-CM | POA: Diagnosis not present

## 2022-04-12 DIAGNOSIS — I10 Essential (primary) hypertension: Secondary | ICD-10-CM | POA: Diagnosis not present

## 2022-04-12 DIAGNOSIS — E119 Type 2 diabetes mellitus without complications: Secondary | ICD-10-CM | POA: Diagnosis not present

## 2022-04-18 DIAGNOSIS — Z862 Personal history of diseases of the blood and blood-forming organs and certain disorders involving the immune mechanism: Secondary | ICD-10-CM | POA: Diagnosis not present

## 2022-04-18 DIAGNOSIS — E1140 Type 2 diabetes mellitus with diabetic neuropathy, unspecified: Secondary | ICD-10-CM | POA: Diagnosis not present

## 2022-04-18 DIAGNOSIS — Z23 Encounter for immunization: Secondary | ICD-10-CM | POA: Diagnosis not present

## 2022-04-18 DIAGNOSIS — K861 Other chronic pancreatitis: Secondary | ICD-10-CM | POA: Diagnosis not present

## 2022-04-18 DIAGNOSIS — E782 Mixed hyperlipidemia: Secondary | ICD-10-CM | POA: Diagnosis not present

## 2022-04-18 DIAGNOSIS — I10 Essential (primary) hypertension: Secondary | ICD-10-CM | POA: Diagnosis not present

## 2022-04-18 DIAGNOSIS — E119 Type 2 diabetes mellitus without complications: Secondary | ICD-10-CM | POA: Diagnosis not present

## 2022-04-18 DIAGNOSIS — E1129 Type 2 diabetes mellitus with other diabetic kidney complication: Secondary | ICD-10-CM | POA: Diagnosis not present

## 2022-04-18 DIAGNOSIS — Z0000 Encounter for general adult medical examination without abnormal findings: Secondary | ICD-10-CM | POA: Diagnosis not present

## 2022-04-18 DIAGNOSIS — Z96642 Presence of left artificial hip joint: Secondary | ICD-10-CM | POA: Diagnosis not present

## 2022-04-28 DIAGNOSIS — Z1231 Encounter for screening mammogram for malignant neoplasm of breast: Secondary | ICD-10-CM | POA: Diagnosis not present

## 2022-05-26 ENCOUNTER — Other Ambulatory Visit: Payer: Self-pay | Admitting: Gastroenterology

## 2022-05-26 ENCOUNTER — Telehealth: Payer: Self-pay | Admitting: *Deleted

## 2022-05-26 DIAGNOSIS — K861 Other chronic pancreatitis: Secondary | ICD-10-CM

## 2022-05-26 MED ORDER — PANCRELIPASE (LIP-PROT-AMYL) 36000-114000 UNITS PO CPEP: ORAL_CAPSULE | ORAL | 6 refills | Status: DC

## 2022-05-26 NOTE — Telephone Encounter (Signed)
Jennifer Porter from [facility] called and states they need a  new prescription for Creon, because the insurance will not pay for 240. They will pay for 300 or 400.

## 2022-05-26 NOTE — Telephone Encounter (Signed)
New Rx sent.

## 2022-06-02 DIAGNOSIS — J019 Acute sinusitis, unspecified: Secondary | ICD-10-CM | POA: Diagnosis not present

## 2022-07-20 ENCOUNTER — Encounter: Payer: Self-pay | Admitting: Gastroenterology

## 2022-07-22 DIAGNOSIS — E119 Type 2 diabetes mellitus without complications: Secondary | ICD-10-CM | POA: Diagnosis not present

## 2022-07-22 DIAGNOSIS — I10 Essential (primary) hypertension: Secondary | ICD-10-CM | POA: Diagnosis not present

## 2022-07-28 DIAGNOSIS — I700 Atherosclerosis of aorta: Secondary | ICD-10-CM | POA: Diagnosis not present

## 2022-07-28 DIAGNOSIS — D649 Anemia, unspecified: Secondary | ICD-10-CM | POA: Diagnosis not present

## 2022-07-28 DIAGNOSIS — R809 Proteinuria, unspecified: Secondary | ICD-10-CM | POA: Diagnosis not present

## 2022-07-28 DIAGNOSIS — E1165 Type 2 diabetes mellitus with hyperglycemia: Secondary | ICD-10-CM | POA: Diagnosis not present

## 2022-07-28 DIAGNOSIS — I10 Essential (primary) hypertension: Secondary | ICD-10-CM | POA: Diagnosis not present

## 2022-07-28 DIAGNOSIS — E782 Mixed hyperlipidemia: Secondary | ICD-10-CM | POA: Diagnosis not present

## 2022-07-28 DIAGNOSIS — K861 Other chronic pancreatitis: Secondary | ICD-10-CM | POA: Diagnosis not present

## 2022-07-28 DIAGNOSIS — L821 Other seborrheic keratosis: Secondary | ICD-10-CM | POA: Diagnosis not present

## 2022-07-28 DIAGNOSIS — N3281 Overactive bladder: Secondary | ICD-10-CM | POA: Diagnosis not present

## 2022-07-28 DIAGNOSIS — Z96642 Presence of left artificial hip joint: Secondary | ICD-10-CM | POA: Diagnosis not present

## 2022-08-03 ENCOUNTER — Encounter (HOSPITAL_COMMUNITY): Payer: Self-pay | Admitting: Nurse Practitioner

## 2022-08-15 NOTE — Progress Notes (Unsigned)
Referring Provider: Benita Stabile, MD Primary Care Physician:  Benita Stabile, MD Primary GI Physician: Dr. Jena Gauss  No chief complaint on file.   HPI:   Jennifer Porter is a 77 y.o. female with history of GERD and chronic pancreatitis based on EUS/MRCP findings in 2020 with evidence of exocrine/endocrine deficiency, on pancreatic enzymes. Etiology of chronic pancreatitis unknown. No FH of pancreatitis. No etoh history. EUS without evidence of biliary etiology, gallbladder has been removed. No pancreatic duct stone on MRCP.  She is presenting today for 1 year follow-up.  Last seen in the office 07/29/2021.  She was doing well taking Creon 2 capsules with meals and 1 capsule with snacks.  Usually having 2 bowel movements a day.  Occasional breakthrough diarrhea and abdominal cramping for which she will take 1 dicyclomine.  No specific food triggers.  Denies any trouble with dairy.  Her weight is stable.  No alarm symptoms.  GERD well-controlled on Protonix daily, occasionally taking a second dose.  Recommend continuing current medications.  We discussed scheduling lumbar spine colonoscopy, but patient declined.  Today:    Past Medical History:  Diagnosis Date   Allergy    seasonal   Asthma    Chronic pancreatitis ([facility])    Complication of anesthesia    Diabetes mellitus without complication ([facility])    GERD (gastroesophageal reflux disease)    hiatal hernia   H/O: gout    Hyperlipidemia    Hypertension    Pancreatitis    PONV (postoperative nausea and vomiting)     Past Surgical History:  Procedure Laterality Date   ABDOMINAL HYSTERECTOMY     APPENDECTOMY     Arthroscopic Left Knee Left 03/22/2003   CHOLECYSTECTOMY     in 1990s   COLONOSCOPY     [place], 2010, without polyps   ESOPHAGOGASTRODUODENOSCOPY (EGD) WITH PROPOFOL N/A 11/08/2018   Procedure: ESOPHAGOGASTRODUODENOSCOPY (EGD) WITH PROPOFOL;  Surgeon: Rachael Fee, MD;  [facility];  Service: Endoscopy;   Laterality: N/A;   EUS N/A 11/08/2018   Procedure: UPPER ENDOSCOPIC ULTRASOUND (EUS) RADIAL;  Surgeon: Rachael Fee, MD;  [facility];  Service: Endoscopy;  Laterality: N/A;   INTRAMEDULLARY (IM) NAIL INTERTROCHANTERIC Left 08/27/2021   Procedure: INTRAMEDULLARY (IM) NAIL INTERTROCHANTRIC;  Surgeon: Roby Lofts, MD;  [facility];  Service: Orthopedics;  Laterality: Left;   ORIF WRIST FRACTURE Left 08/27/2021   Procedure: OPEN REDUCTION INTERNAL FIXATION (ORIF) WRIST FRACTURE;  Surgeon: Roby Lofts, MD;  [facility];  Service: Orthopedics;  Laterality: Left;    Current Outpatient Medications  Medication Sig Dispense Refill   acetaminophen (TYLENOL) 325 MG tablet Take 2 tablets (650 mg total) by mouth with breakfast, with lunch, and with evening meal.     apixaban (ELIQUIS) 2.5 MG TABS tablet Take 1 tablet (2.5 mg total) by mouth 2 (two) times daily. 60 tablet 0   BD PEN NEEDLE NANO U/F 32G X 4 MM MISC USE UP TO TWICE DAILY 100 each 1   Cholecalciferol (VITAMIN D3) 125 MCG (5000 UT) TABS Take 5,000 Units by mouth daily.     Choline Fenofibrate (FENOFIBRIC ACID) 135 MG CPDR Take 1 capsule by mouth at bedtime. 90 capsule 2   diclofenac Sodium (VOLTAREN) 1 % GEL Apply 2 g topically 3 (three) times daily between meals. 2 g 0   dicyclomine (BENTYL) 10 MG capsule Take 1 capsule (10 mg total) by mouth 2 (two) times daily as needed for spasms.  30 capsule 0   estradiol (ESTRACE) 0.5 MG tablet TAKE ONE TABLET BY MOUTH DAILY 90 tablet 3   ferrous sulfate 325 (65 FE) MG tablet Take 1 tablet (325 mg total) by mouth daily with breakfast. 30 tablet 3   fluticasone (FLONASE) 50 MCG/ACT nasal spray Place 2 sprays into both nostrils daily as needed. (Patient taking differently: Place 2 sprays into both nostrils daily as needed for allergies.) 16 g 5   gabapentin (NEURONTIN) 300 MG capsule Take 1 capsule (300 mg total) by mouth at bedtime. 90 capsule 1   HYDROcodone-acetaminophen  (NORCO/VICODIN) 5-325 MG tablet Take 1 tablet by mouth every 6 (six) hours as needed for moderate pain or severe pain. (Patient not taking: Reported on 10/05/2021) 30 tablet 0   insulin detemir (LEVEMIR) 100 UNIT/ML injection Inject 0.05 mLs (5 Units total) into the skin at bedtime. 10 mL 11   lipase/protease/amylase (CREON) 36000 UNITS CPEP capsule TAKE TWO CAPSULES BY MOUTH WITH MEALS AND ONE WITH SNACKS (UP TO 8 A DAY) 300 capsule 6   lisinopril (ZESTRIL) 10 MG tablet Take 1 tablet (10 mg total) by mouth daily. 30 tablet 0   Magnesium Oxide, Elemental, 400 MG TABS Take 1 tablet by mouth in the morning and at bedtime. (Patient taking differently: Take 1 tablet by mouth in the morning and at bedtime. Takes BID per pt) 180 tablet 3   metFORMIN (GLUCOPHAGE) 850 MG tablet Take 1 tablet (850 mg total) by mouth daily with breakfast. 30 tablet 0   methocarbamol (ROBAXIN) 500 MG tablet Take 1 tablet (500 mg total) by mouth 4 (four) times daily -  before meals and at bedtime. 90 tablet 0   pantoprazole (PROTONIX) 40 MG tablet Take one tablet once or twice daily, 30 minutes before a meal. 180 tablet 3   polyethylene glycol (MIRALAX / GLYCOLAX) 17 g packet Take 17 g by mouth daily as needed for mild constipation. 14 each 0   pravastatin (PRAVACHOL) 10 MG tablet TAKE ONE TABLET (10MG  TOTAL) BY MOUTH ATBEDTIME (Patient taking differently: Take 10 mg by mouth at bedtime.) 90 tablet 0   sodium chloride (OCEAN) 0.65 % SOLN nasal spray Place 1 spray into both nostrils as needed for congestion. 88 mL 0   Vitamin D, Ergocalciferol, (DRISDOL) 1.25 MG (50000 UNIT) CAPS capsule Take 1 capsule (50,000 Units total) by mouth every 7 (seven) days. 5 capsule 0   No current facility-administered medications for this visit.    Allergies as of 08/17/2022 - Review Complete 01/07/2022  Allergen Reaction Noted   Codeine Nausea And Vomiting 11/28/2012   Jardiance [empagliflozin]  02/08/2019   Macrobid [nitrofurantoin] Hives  11/28/2012   Simvastatin  10/26/2017    Family History  Problem Relation Age of Onset   Stroke Mother    Diabetes Mother    Vision loss Mother        eye removed   Cancer Mother        cancer in eye - removed   Diabetes Brother    Heart disease Father    Colon cancer Sister    Uterine cancer Paternal Grandmother    Uterine cancer Maternal Aunt    Pancreatic cancer Maternal Aunt     Social History   Socioeconomic History   Marital status: Married    Spouse name: Not on file   Number of children: 1   Years of education: Not on file   Highest education level: Not on file  Occupational History   Occupation:  retired Runner, broadcasting/film/video  Tobacco Use   Smoking status: Never   Smokeless tobacco: Never  Vaping Use   Vaping Use: Never used  Substance and Sexual Activity   Alcohol use: No   Drug use: No   Sexual activity: Not Currently  Other Topics Concern   Not on file  Social History Narrative   Retired March 2014.   Did teach Kindergarten at [employer]   Social Determinants of Health   Financial Resource Strain: Not on file  Food Insecurity: Not on file  Transportation Needs: No Transportation Needs (09/28/2020)   PRAPARE - Administrator, Civil Service (Medical): No    Lack of Transportation (Non-Medical): No  Physical Activity: Not on file  Stress: Not on file  Social Connections: Not on file    Review of Systems: Gen: Denies fever, chills, anorexia. Denies fatigue, weakness, weight loss.  CV: Denies chest pain, palpitations, syncope, peripheral edema, and claudication. Resp: Denies dyspnea at rest, cough, wheezing, coughing up blood, and pleurisy. GI: Denies vomiting blood, jaundice, and fecal incontinence.   Denies dysphagia or odynophagia. Derm: Denies rash, itching, dry skin Psych: Denies depression, anxiety, memory loss, confusion. No homicidal or suicidal ideation.  Heme: Denies bruising, bleeding, and enlarged lymph nodes.  Physical  Exam: There were no vitals taken for this visit. General:   Alert and oriented. No distress noted. Pleasant and cooperative.  Head:  Normocephalic and atraumatic. Eyes:  Conjuctiva clear without scleral icterus. Heart:  S1, S2 present without murmurs appreciated. Lungs:  Clear to auscultation bilaterally. No wheezes, rales, or rhonchi. No distress.  Abdomen:  +BS, soft, non-tender and non-distended. No rebound or guarding. No HSM or masses noted. Msk:  Symmetrical without gross deformities. Normal posture. Extremities:  Without edema. Neurologic:  Alert and  oriented x4 Psych:  Normal mood and affect.    Assessment:     Plan:  ***   Ermalinda Memos, PA-C [facility] 08/17/2022

## 2022-08-17 ENCOUNTER — Encounter: Payer: Self-pay | Admitting: Gastroenterology

## 2022-08-17 ENCOUNTER — Ambulatory Visit (INDEPENDENT_AMBULATORY_CARE_PROVIDER_SITE_OTHER): Payer: Medicare Other | Admitting: Gastroenterology

## 2022-08-17 VITALS — BP 121/60 | HR 79 | Temp 98.6°F | Ht 64.5 in | Wt 130.4 lb

## 2022-08-17 DIAGNOSIS — K861 Other chronic pancreatitis: Secondary | ICD-10-CM

## 2022-08-17 DIAGNOSIS — K219 Gastro-esophageal reflux disease without esophagitis: Secondary | ICD-10-CM

## 2022-08-17 MED ORDER — DICYCLOMINE HCL 10 MG PO CAPS: 10.0000 mg | ORAL_CAPSULE | Freq: Two times a day (BID) | ORAL | 5 refills | Status: DC | PRN

## 2022-08-17 NOTE — Patient Instructions (Signed)
Continue pantoprazole 40 mg daily.  Continue to use Gaviscon as needed for breakthrough heartburn symptoms.  Continue Creon 2 capsules with meals and 1 capsule with snacks.  Continue dicyclomine as needed for abdominal pain/loose stools.  Will plan to follow-up with you in 1 year.  Do not hesitate to call sooner if you have questions or concerns.  It was great to see you again today!  Ermalinda Memos, PA-C [facility]

## 2022-09-29 ENCOUNTER — Other Ambulatory Visit (HOSPITAL_COMMUNITY): Payer: Self-pay | Admitting: Family Medicine

## 2022-09-29 ENCOUNTER — Ambulatory Visit (HOSPITAL_COMMUNITY)
Admission: RE | Admit: 2022-09-29 | Discharge: 2022-09-29 | Disposition: A | Discharge status: Home / Self Care | Payer: Medicare Other | Source: Ambulatory Visit | Attending: Family Medicine | Admitting: Family Medicine

## 2022-09-29 DIAGNOSIS — J42 Unspecified chronic bronchitis: Secondary | ICD-10-CM | POA: Diagnosis not present

## 2022-09-29 DIAGNOSIS — J019 Acute sinusitis, unspecified: Secondary | ICD-10-CM | POA: Diagnosis not present

## 2022-09-29 DIAGNOSIS — R059 Cough, unspecified: Secondary | ICD-10-CM | POA: Diagnosis not present

## 2022-09-29 DIAGNOSIS — J069 Acute upper respiratory infection, unspecified: Secondary | ICD-10-CM

## 2022-09-29 DIAGNOSIS — I700 Atherosclerosis of aorta: Secondary | ICD-10-CM | POA: Diagnosis not present

## 2022-10-17 DIAGNOSIS — M1712 Unilateral primary osteoarthritis, left knee: Secondary | ICD-10-CM | POA: Diagnosis not present

## 2023-01-23 DIAGNOSIS — E1165 Type 2 diabetes mellitus with hyperglycemia: Secondary | ICD-10-CM | POA: Diagnosis not present

## 2023-01-23 DIAGNOSIS — I10 Essential (primary) hypertension: Secondary | ICD-10-CM | POA: Diagnosis not present

## 2023-01-27 DIAGNOSIS — I700 Atherosclerosis of aorta: Secondary | ICD-10-CM | POA: Diagnosis not present

## 2023-01-27 DIAGNOSIS — K861 Other chronic pancreatitis: Secondary | ICD-10-CM | POA: Diagnosis not present

## 2023-01-27 DIAGNOSIS — I10 Essential (primary) hypertension: Secondary | ICD-10-CM | POA: Diagnosis not present

## 2023-01-27 DIAGNOSIS — R809 Proteinuria, unspecified: Secondary | ICD-10-CM | POA: Diagnosis not present

## 2023-01-27 DIAGNOSIS — N3281 Overactive bladder: Secondary | ICD-10-CM | POA: Diagnosis not present

## 2023-01-27 DIAGNOSIS — E1165 Type 2 diabetes mellitus with hyperglycemia: Secondary | ICD-10-CM | POA: Diagnosis not present

## 2023-01-27 DIAGNOSIS — Z96642 Presence of left artificial hip joint: Secondary | ICD-10-CM | POA: Diagnosis not present

## 2023-01-27 DIAGNOSIS — L821 Other seborrheic keratosis: Secondary | ICD-10-CM | POA: Diagnosis not present

## 2023-01-27 DIAGNOSIS — E782 Mixed hyperlipidemia: Secondary | ICD-10-CM | POA: Diagnosis not present

## 2023-04-28 ENCOUNTER — Other Ambulatory Visit: Payer: Self-pay | Admitting: Gastroenterology

## 2023-04-28 DIAGNOSIS — K861 Other chronic pancreatitis: Secondary | ICD-10-CM

## 2023-05-04 DIAGNOSIS — Z1231 Encounter for screening mammogram for malignant neoplasm of breast: Secondary | ICD-10-CM | POA: Diagnosis not present

## 2023-05-26 ENCOUNTER — Other Ambulatory Visit: Payer: Self-pay | Admitting: Gastroenterology

## 2023-05-26 DIAGNOSIS — K219 Gastro-esophageal reflux disease without esophagitis: Secondary | ICD-10-CM

## 2023-06-15 DIAGNOSIS — J302 Other seasonal allergic rhinitis: Secondary | ICD-10-CM | POA: Diagnosis not present

## 2023-06-15 DIAGNOSIS — J019 Acute sinusitis, unspecified: Secondary | ICD-10-CM | POA: Diagnosis not present

## 2023-07-06 ENCOUNTER — Encounter: Payer: Self-pay | Admitting: Gastroenterology

## 2023-07-21 NOTE — Progress Notes (Unsigned)
 /   Referring Provider: Omie Bickers, MD Primary Care Physician:  Omie Bickers, MD Primary GI Physician: Dr. Riley Cheadle  No chief complaint on file.   HPI:   Jennifer Porter is a 78 y.o. female with history of GERD and chronic pancreatitis based on EUS/MRCP findings in 2020 with evidence of exocrine/endocrine deficiency, on pancreatic enzymes. Etiology of chronic pancreatitis unknown. No FH of pancreatitis. No etoh history. EUS without evidence of biliary etiology, gallbladder has been removed. No pancreatic duct stone on MRCP.  She is presenting today for 1 year follow-up.   Last seen in the office 08/17/23. She was doing well on Creon  and Protonix . Reported occasional episodes of abdominal pain 2-3 times a year for which she would back down to liquid diet and take dicyclomine  with resolution of symptoms. Advised to continue her current medications.    Today:     Past Medical History:  Diagnosis Date   Allergy    seasonal   Asthma    Chronic pancreatitis (HCC)    Complication of anesthesia    Diabetes mellitus without complication (HCC)    GERD (gastroesophageal reflux disease)    hiatal hernia   H/O: gout    Hyperlipidemia    Hypertension    Pancreatitis    PONV (postoperative nausea and vomiting)     Past Surgical History:  Procedure Laterality Date   ABDOMINAL HYSTERECTOMY     APPENDECTOMY     Arthroscopic Left Knee Left 03/22/2003   CHOLECYSTECTOMY     in 1990s   COLONOSCOPY     [place], 2010, without polyps   ESOPHAGOGASTRODUODENOSCOPY (EGD) WITH PROPOFOL  N/A 11/08/2018   Procedure: ESOPHAGOGASTRODUODENOSCOPY (EGD) WITH PROPOFOL ;  Surgeon: Janel Medford, MD;  Location: [facility];  Service: Endoscopy;  Laterality: N/A;   EUS N/A 11/08/2018   Procedure: UPPER ENDOSCOPIC ULTRASOUND (EUS) RADIAL;  Surgeon: Janel Medford, MD;  Location: [facility];  Service: Endoscopy;  Laterality: N/A;   INTRAMEDULLARY (IM) NAIL INTERTROCHANTERIC Left 08/27/2021   Procedure:  INTRAMEDULLARY (IM) NAIL INTERTROCHANTRIC;  Surgeon: Laneta Pintos, MD;  Location: [facility];  Service: Orthopedics;  Laterality: Left;   ORIF WRIST FRACTURE Left 08/27/2021   Procedure: OPEN REDUCTION INTERNAL FIXATION (ORIF) WRIST FRACTURE;  Surgeon: Laneta Pintos, MD;  Location: [facility] OR;  Service: Orthopedics;  Laterality: Left;    Current Outpatient Medications  Medication Sig Dispense Refill   acetaminophen  (TYLENOL ) 325 MG tablet Take 2 tablets (650 mg total) by mouth with breakfast, with lunch, and with evening meal.     aspirin 81 MG chewable tablet Chew 1 tablet every day by oral route.     BD PEN NEEDLE NANO U/F 32G X 4 MM MISC USE UP TO TWICE DAILY 100 each 1   Cholecalciferol (VITAMIN D3) 125 MCG (5000 UT) TABS Take 5,000 Units by mouth daily.     Choline Fenofibrate  (FENOFIBRIC ACID ) 135 MG CPDR Take 1 capsule by mouth at bedtime. 90 capsule 2   CREON  36000-114000 units CPEP capsule TAKE 2 CAPSULES BY MOUTH WITH MEALS AND 1 CAPSULE WITH SNACKS (UP TO 8 CAPSULES PER DAY) 300 capsule 6   diclofenac  Sodium (VOLTAREN ) 1 % GEL Apply 2 g topically 3 (three) times daily between meals. 2 g 0   dicyclomine  (BENTYL ) 10 MG capsule Take 1 capsule (10 mg total) by mouth 2 (two) times daily as needed for spasms. 30 capsule 5   estradiol  (ESTRACE ) 0.5 MG tablet TAKE ONE TABLET BY MOUTH DAILY 90  tablet 3   ferrous sulfate  325 (65 FE) MG tablet Take 1 tablet (325 mg total) by mouth daily with breakfast. 30 tablet 3   fluticasone  (FLONASE ) 50 MCG/ACT nasal spray Place 2 sprays into both nostrils daily as needed. (Patient taking differently: Place 2 sprays into both nostrils daily as needed for allergies.) 16 g 5   gabapentin  (NEURONTIN ) 300 MG capsule Take 1 capsule (300 mg total) by mouth at bedtime. 90 capsule 1   lisinopril  (ZESTRIL ) 10 MG tablet Take 1 tablet (10 mg total) by mouth daily. 30 tablet 0   Magnesium  Oxide, Elemental, 400 MG TABS Take 1 tablet by mouth in the morning and at bedtime.  (Patient taking differently: Take 1 tablet by mouth in the morning and at bedtime. Takes BID per pt) 180 tablet 3   metFORMIN  (GLUCOPHAGE ) 850 MG tablet Take 1 tablet (850 mg total) by mouth daily with breakfast. 30 tablet 0   pantoprazole  (PROTONIX ) 40 MG tablet TAKE ONE TABLET BY MOUTH ONCE OR TWICE DAILY, 30 MINUTES BEFORE A MEAL 180 tablet 0   pravastatin  (PRAVACHOL ) 10 MG tablet TAKE ONE TABLET (10MG  TOTAL) BY MOUTH ATBEDTIME (Patient taking differently: Take 10 mg by mouth at bedtime.) 90 tablet 0   sodium chloride  (OCEAN) 0.65 % SOLN nasal spray Place 1 spray into both nostrils as needed for congestion. 88 mL 0   No current facility-administered medications for this visit.    Allergies as of 07/24/2023 - Review Complete 01/07/2022  Allergen Reaction Noted   Codeine Nausea And Vomiting 11/28/2012   Jardiance  [empagliflozin ]  02/08/2019   Macrobid [nitrofurantoin] Hives 11/28/2012   Simvastatin   10/26/2017    Family History  Problem Relation Age of Onset   Stroke Mother    Diabetes Mother    Vision loss Mother        eye removed   Cancer Mother        cancer in eye - removed   Diabetes Brother    Heart disease Father    Colon cancer Sister    Uterine cancer Paternal Grandmother    Uterine cancer Maternal Aunt    Pancreatic cancer Maternal Aunt     Social History   Socioeconomic History   Marital status: Married    Spouse name: Not on file   Number of children: 1   Years of education: Not on file   Highest education level: Not on file  Occupational History   Occupation: retired Runner, broadcasting/film/video  Tobacco Use   Smoking status: Never   Smokeless tobacco: Never  Vaping Use   Vaping status: Never Used  Substance and Sexual Activity   Alcohol use: No   Drug use: No   Sexual activity: Not Currently  Other Topics Concern   Not on file  Social History Narrative   Retired March 2014.   Did teach Kindergarten at [employer]   Social Drivers of Health   Financial  Resource Strain: Not on file  Food Insecurity: Not on file  Transportation Needs: No Transportation Needs (09/28/2020)   PRAPARE - Administrator, Civil Service (Medical): No    Lack of Transportation (Non-Medical): No  Physical Activity: Not on file  Stress: Not on file  Social Connections: Not on file    Review of Systems: Gen: Denies fever, chills, anorexia. Denies fatigue, weakness, weight loss.  CV: Denies chest pain, palpitations, syncope, peripheral edema, and claudication. Resp: Denies dyspnea at rest, cough, wheezing, coughing up blood, and pleurisy. GI: Denies  vomiting blood, jaundice, and fecal incontinence.   Denies dysphagia or odynophagia. Derm: Denies rash, itching, dry skin Psych: Denies depression, anxiety, memory loss, confusion. No homicidal or suicidal ideation.  Heme: Denies bruising, bleeding, and enlarged lymph nodes.  Physical Exam: There were no vitals taken for this visit. General:   Alert and oriented. No distress noted. Pleasant and cooperative.  Head:  Normocephalic and atraumatic. Eyes:  Conjuctiva clear without scleral icterus. Heart:  S1, S2 present without murmurs appreciated. Lungs:  Clear to auscultation bilaterally. No wheezes, rales, or rhonchi. No distress.  Abdomen:  +BS, soft, non-tender and non-distended. No rebound or guarding. No HSM or masses noted. Msk:  Symmetrical without gross deformities. Normal posture. Extremities:  Without edema. Neurologic:  Alert and  oriented x4 Psych:  Normal mood and affect.    Assessment:     Plan:  ***   Shana Daring, PA-C [facility] 07/24/2023

## 2023-07-24 ENCOUNTER — Encounter: Payer: Self-pay | Admitting: Gastroenterology

## 2023-07-24 ENCOUNTER — Ambulatory Visit (INDEPENDENT_AMBULATORY_CARE_PROVIDER_SITE_OTHER): Admitting: Gastroenterology

## 2023-07-24 VITALS — BP 139/57 | HR 93 | Temp 97.6°F | Ht 64.5 in | Wt 130.4 lb

## 2023-07-24 DIAGNOSIS — K219 Gastro-esophageal reflux disease without esophagitis: Secondary | ICD-10-CM

## 2023-07-24 DIAGNOSIS — K861 Other chronic pancreatitis: Secondary | ICD-10-CM | POA: Diagnosis not present

## 2023-07-24 NOTE — Patient Instructions (Addendum)
 Continue Protonix  40 mg twice daily before meals.   You can try increasing creon  to 3 pills with meals and 2 with snacks.   Try to keep a log of when diarrhea occurs and what you have eaten prior to see if we can identify any specific triggers.   Follow a low fat diet.    I will see you back in 1 year or sooner if needed.   Shana Daring, PA-C [facility]

## 2023-07-27 DIAGNOSIS — E1165 Type 2 diabetes mellitus with hyperglycemia: Secondary | ICD-10-CM | POA: Diagnosis not present

## 2023-07-27 DIAGNOSIS — I10 Essential (primary) hypertension: Secondary | ICD-10-CM | POA: Diagnosis not present

## 2023-07-31 DIAGNOSIS — Z96642 Presence of left artificial hip joint: Secondary | ICD-10-CM | POA: Diagnosis not present

## 2023-07-31 DIAGNOSIS — D649 Anemia, unspecified: Secondary | ICD-10-CM | POA: Diagnosis not present

## 2023-07-31 DIAGNOSIS — E782 Mixed hyperlipidemia: Secondary | ICD-10-CM | POA: Diagnosis not present

## 2023-07-31 DIAGNOSIS — L821 Other seborrheic keratosis: Secondary | ICD-10-CM | POA: Diagnosis not present

## 2023-07-31 DIAGNOSIS — R809 Proteinuria, unspecified: Secondary | ICD-10-CM | POA: Diagnosis not present

## 2023-07-31 DIAGNOSIS — K861 Other chronic pancreatitis: Secondary | ICD-10-CM | POA: Diagnosis not present

## 2023-07-31 DIAGNOSIS — I10 Essential (primary) hypertension: Secondary | ICD-10-CM | POA: Diagnosis not present

## 2023-07-31 DIAGNOSIS — E1165 Type 2 diabetes mellitus with hyperglycemia: Secondary | ICD-10-CM | POA: Diagnosis not present

## 2023-07-31 DIAGNOSIS — N3281 Overactive bladder: Secondary | ICD-10-CM | POA: Diagnosis not present

## 2023-07-31 DIAGNOSIS — I700 Atherosclerosis of aorta: Secondary | ICD-10-CM | POA: Diagnosis not present

## 2023-08-02 ENCOUNTER — Other Ambulatory Visit: Payer: Self-pay | Admitting: Gastroenterology

## 2023-08-02 ENCOUNTER — Telehealth: Payer: Self-pay | Admitting: *Deleted

## 2023-08-02 DIAGNOSIS — K861 Other chronic pancreatitis: Secondary | ICD-10-CM

## 2023-08-02 MED ORDER — PANCRELIPASE (LIP-PROT-AMYL) 36000-114000 UNITS PO CPEP: ORAL_CAPSULE | ORAL | 5 refills | Status: AC

## 2023-08-02 NOTE — Telephone Encounter (Signed)
 Great! Rx has been sent.

## 2023-08-02 NOTE — Telephone Encounter (Signed)
 Pt called and states that 3 Creon  a day is working. She states her diarrhea is better. Needs a new prescription sent to pharmacy.

## 2023-08-02 NOTE — Telephone Encounter (Signed)
 Noted.

## 2023-08-03 IMAGING — RF DG WRIST COMPLETE 3+V*L*
1 series · 4 of 4 positions shown · non-contrast
Comparison: Left wrist x-rays from yesterday.

CLINICAL DATA: Left wrist ORIF.

EXAM:
LEFT WRIST - COMPLETE 3+ VIEW

[Series 1: run · 4 of 4 slices shown]
[im 1/4]
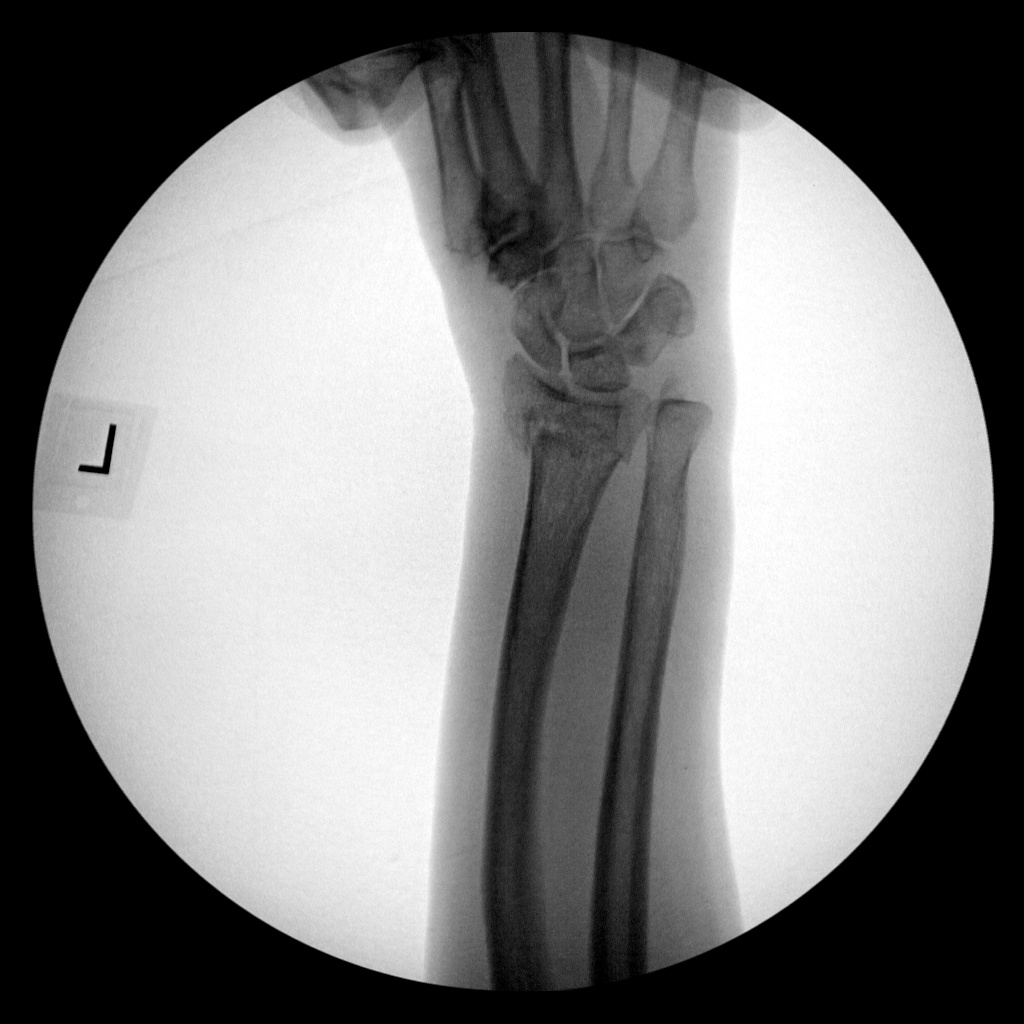
[im 2/4]
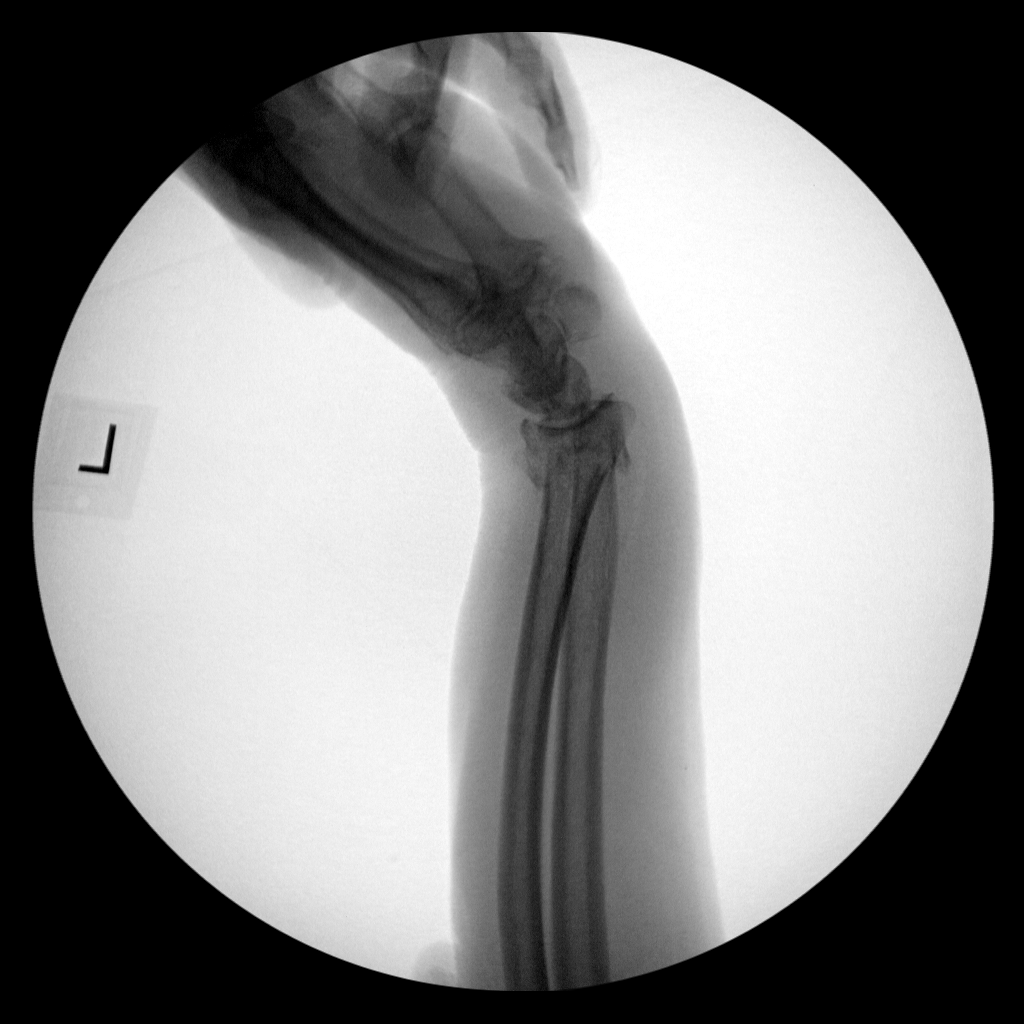
[im 3/4]
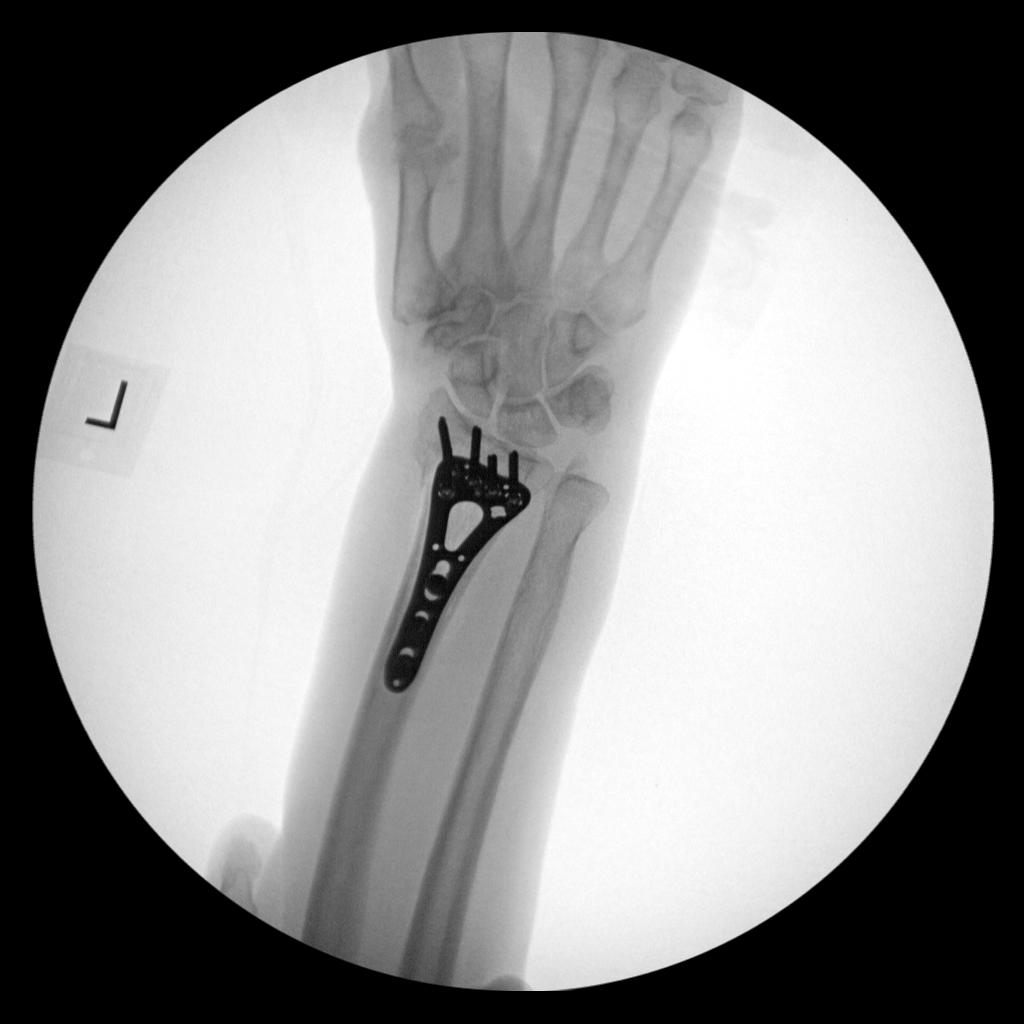
[im 4/4]
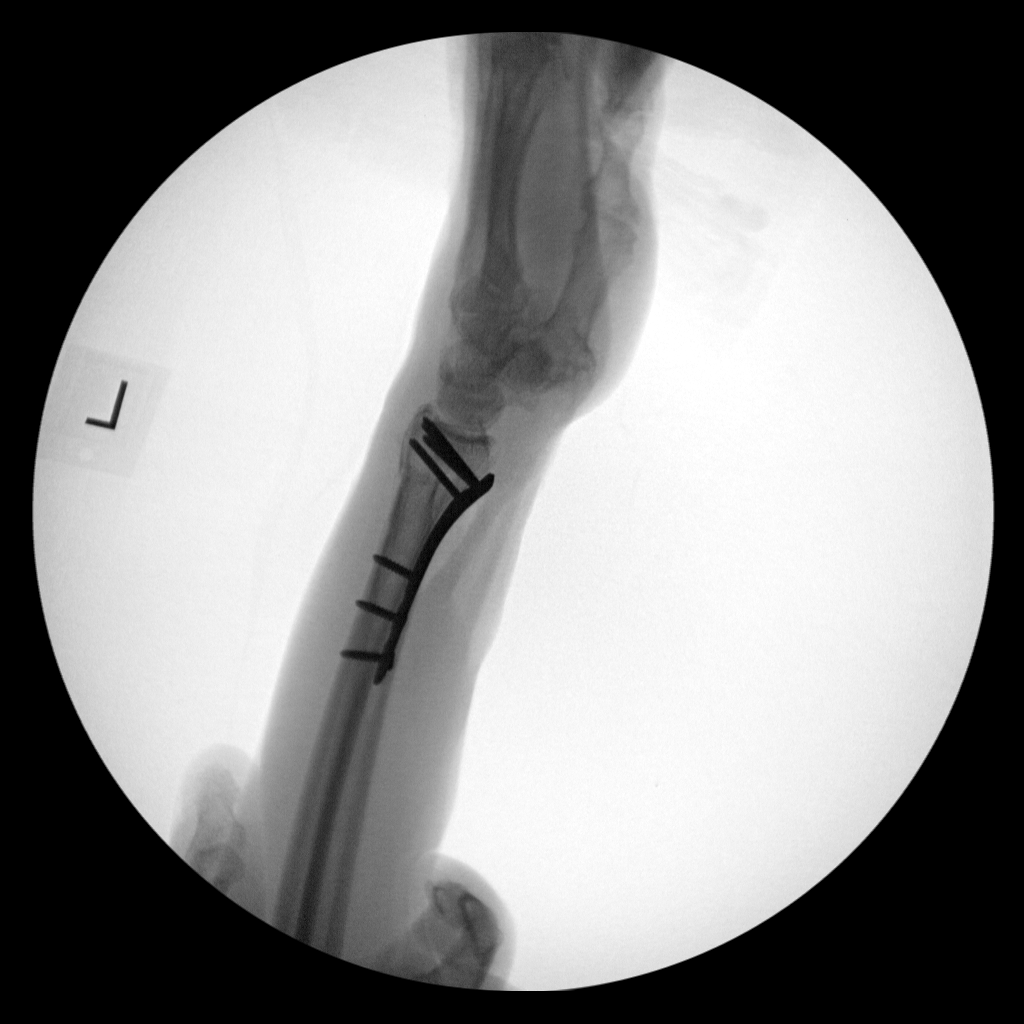

[4 of 4 positions shown; findings below may reference images not displayed]

FLUOROSCOPY TIME:  Radiation Exposure Index (as provided by the
fluoroscopic device): 11.69 mGy Kerma

C-arm fluoroscopic images were obtained intraoperatively and
submitted for post operative interpretation.
FINDINGS: Multiple intraoperative fluoroscopic images demonstrate interval
volar plate and screw fixation of the distal radius fracture, now in
anatomic alignment.
IMPRESSION: 1. Intraoperative fluoroscopic guidance for distal radius fracture
ORIF.

## 2023-08-03 IMAGING — DX DG HIP (WITH OR WITHOUT PELVIS) 1V PORT*L*
2 series · 2 of 2 positions shown · non-contrast
Comparison: 08/26/2021

CLINICAL DATA: Internal fixation of intertrochanteric fracture of
left femur

EXAM:
DG HIP (WITH OR WITHOUT PELVIS) 1V PORT LEFT

[pelvis]
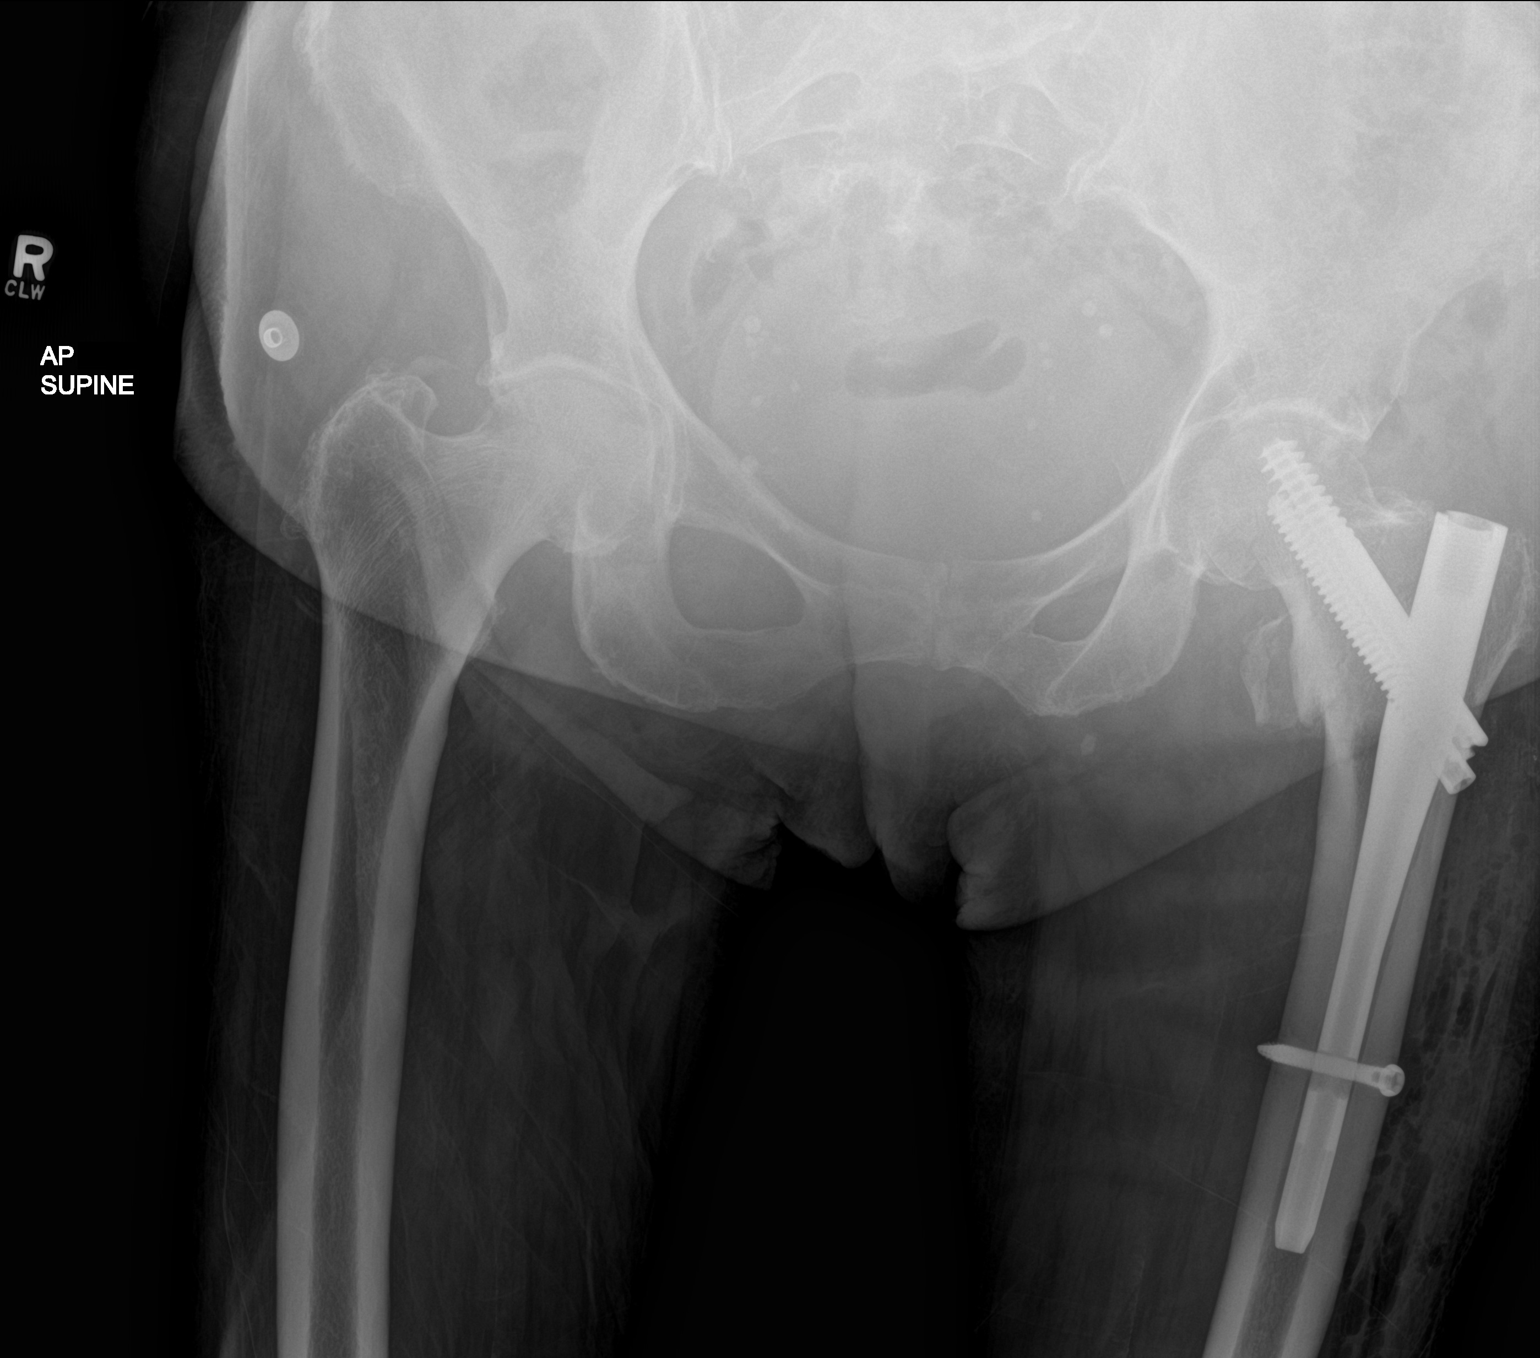

[hip]
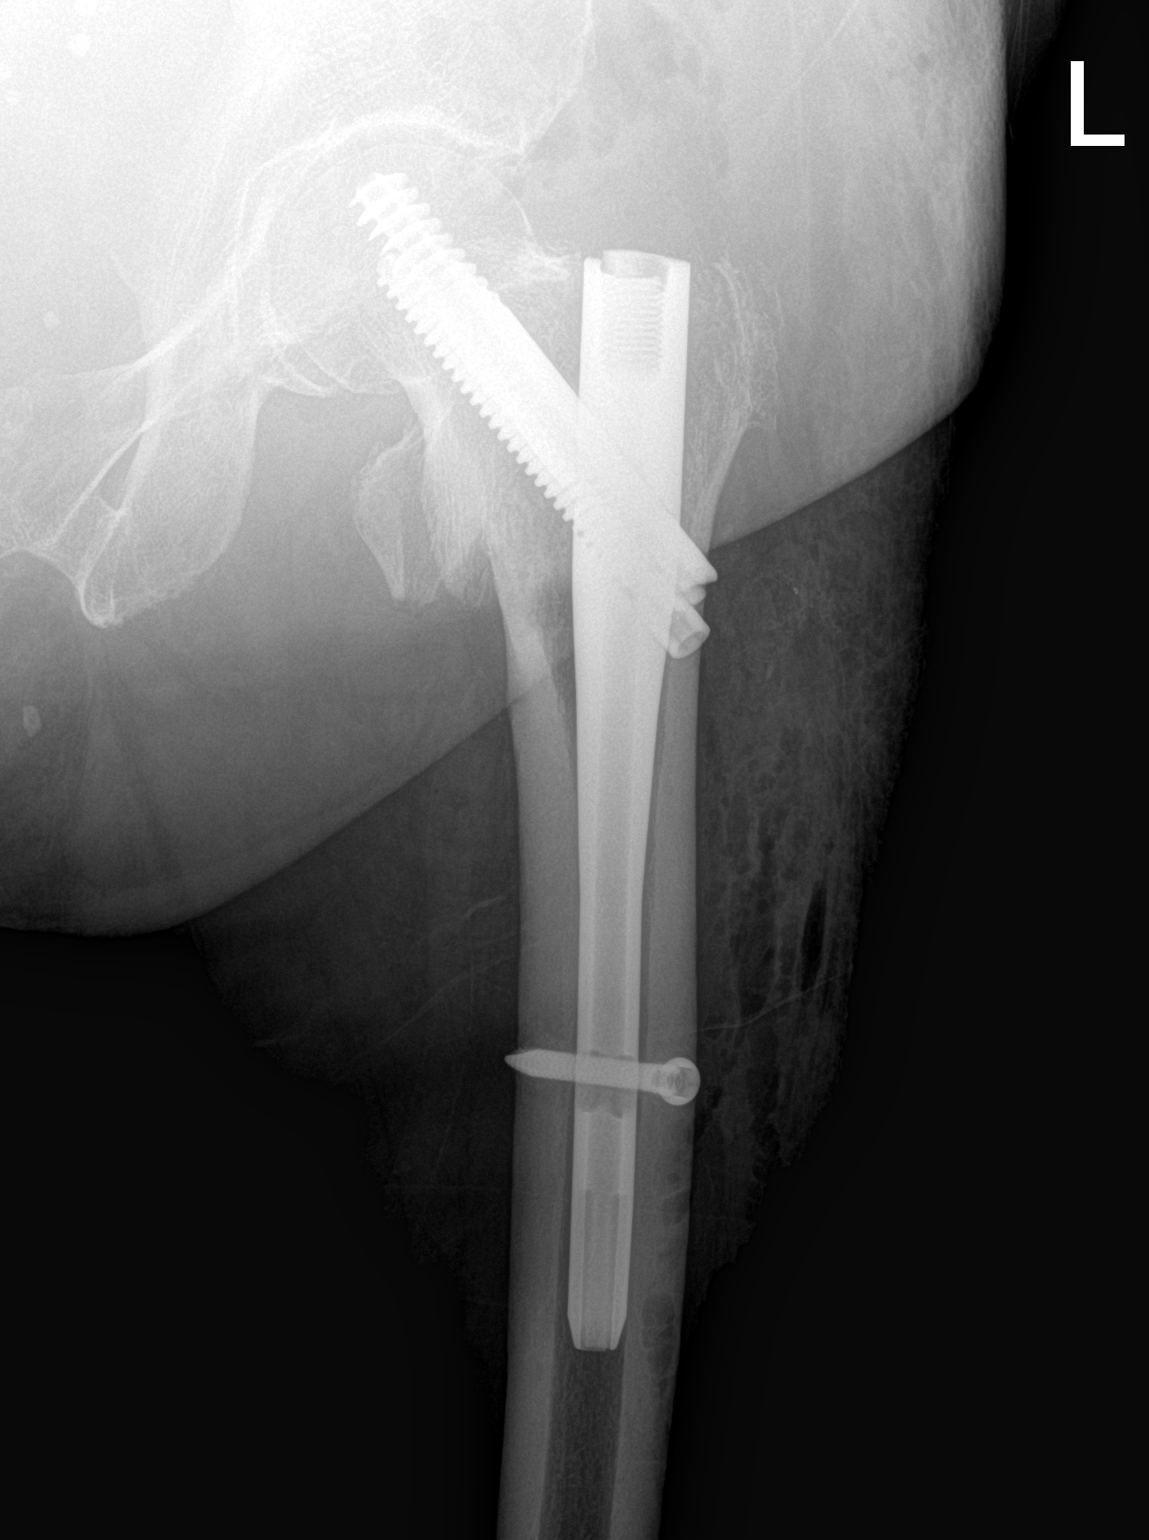

[2 of 2 positions shown; findings below may reference images not displayed]

FINDINGS: There is interval internal fixation of comminuted intertrochanteric
fracture of proximal left femur. There are pockets of air in the
soft tissues. There is medial displacement of lesser trochanter
which has not changed.
IMPRESSION: Interval internal fixation of intertrochanteric fracture of left
femur.

## 2023-08-03 IMAGING — DX DG WRIST 2V*L*
1 series · 2 of 2 positions shown · non-contrast
Comparison: 08/26/2021

CLINICAL DATA: Fracture radius and ulna

EXAM:
LEFT WRIST - 2 VIEW

[Series 1: wrist · 0.14mm/px · 2 of 2 slices shown]
[im 1/2]
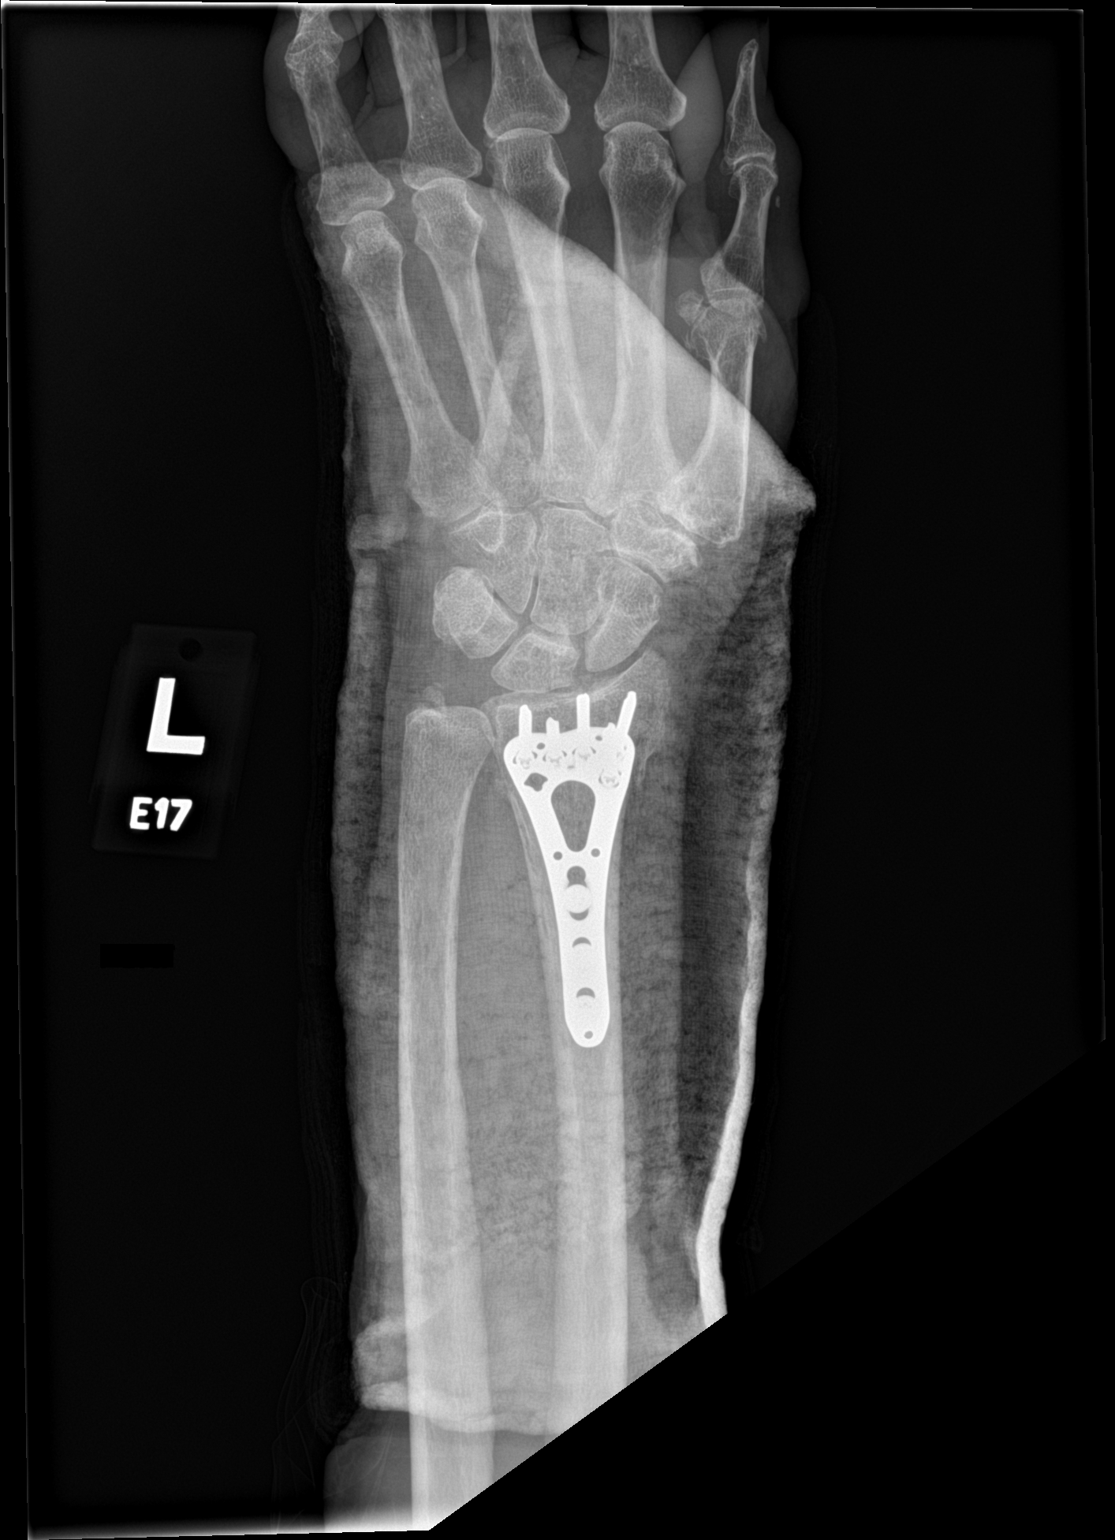
[im 2/2]
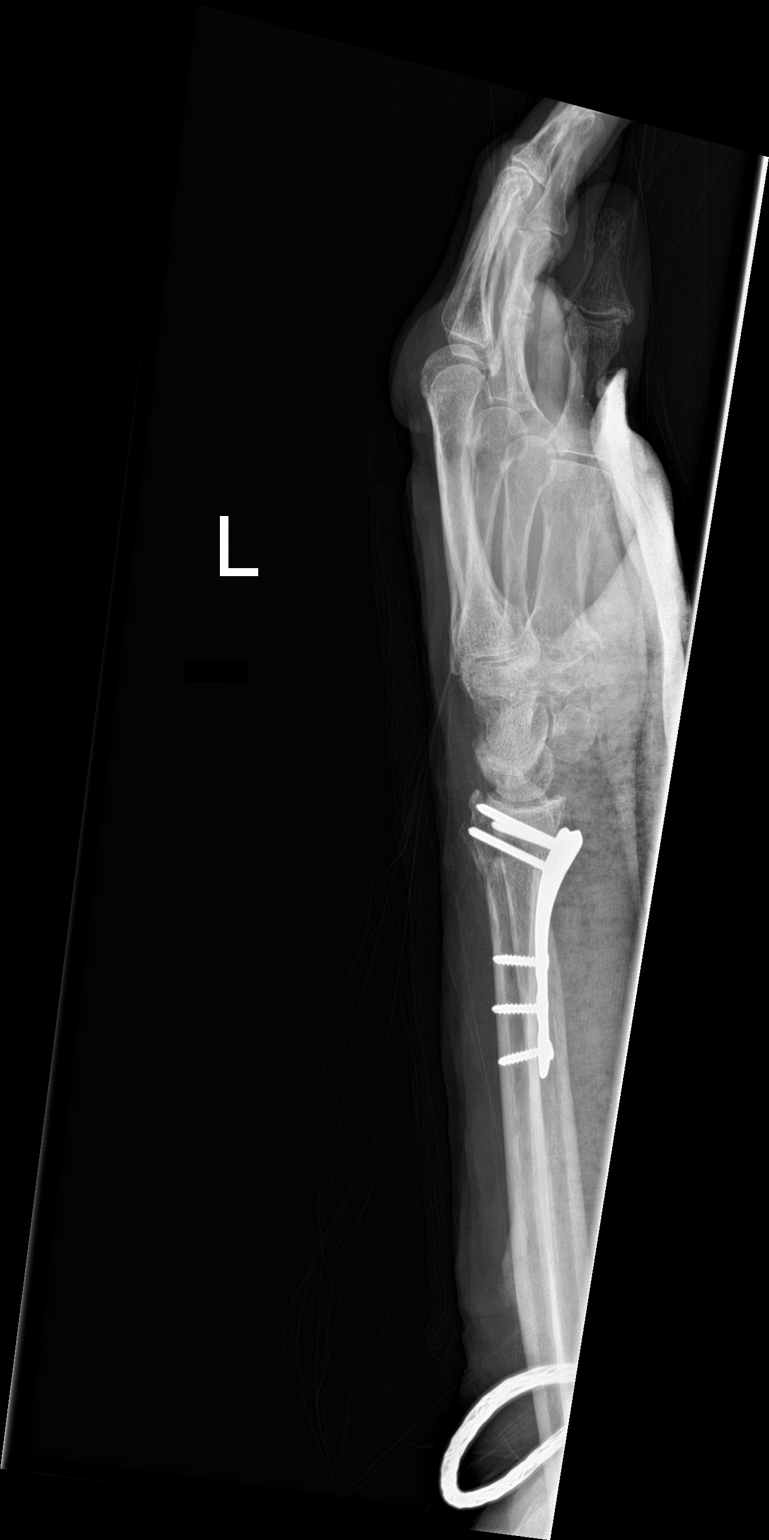

[2 of 2 positions shown; findings below may reference images not displayed]

FINDINGS: There is interval reduction and internal fixation of comminuted
fracture of distal left radius with metallic plate and multiple
surgical screws. There is comminuted fracture in the ulnar styloid
with no significant change. Degenerative changes are noted in first
carpometacarpal and first metacarpophalangeal joints.
IMPRESSION: Interval reduction and internal fixation of comminuted fracture of
distal left radius.

## 2023-08-03 IMAGING — RF DG FEMUR 2+V*L*
1 series · 4 of 4 positions shown · non-contrast
Comparison: 08/26/2021

CLINICAL DATA: Left femoral neck fracture.

EXAM:
LEFT FEMUR 2 VIEWS

[Series 1: run · 4 of 4 slices shown]
[im 1/4]
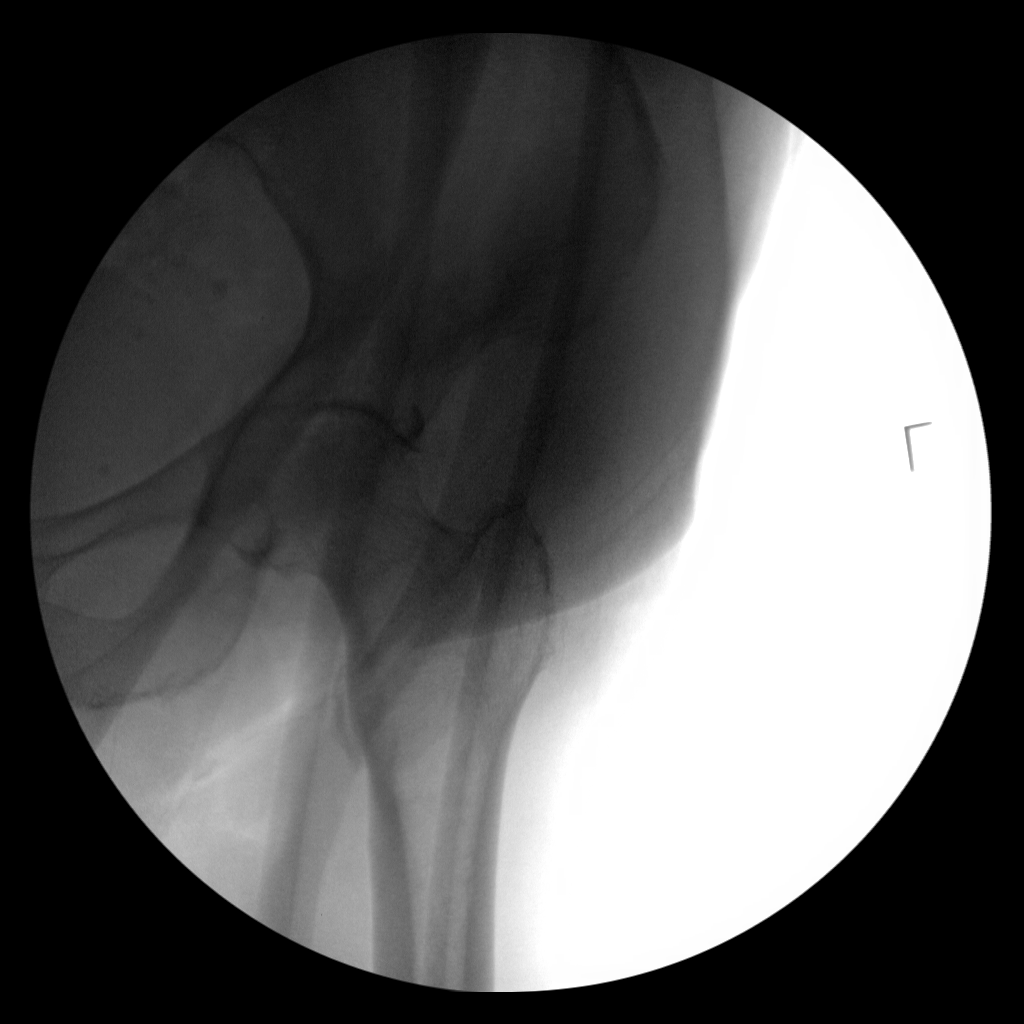
[im 2/4]
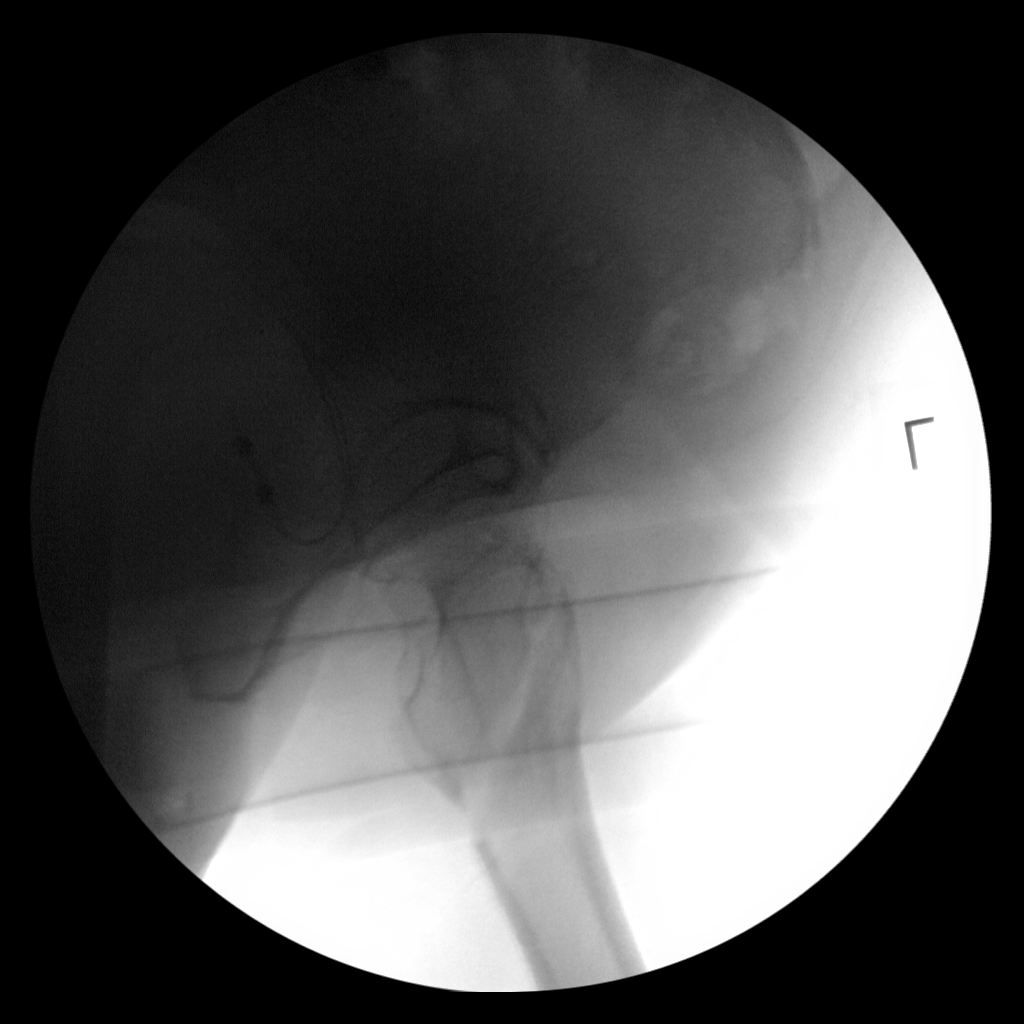
[im 3/4]
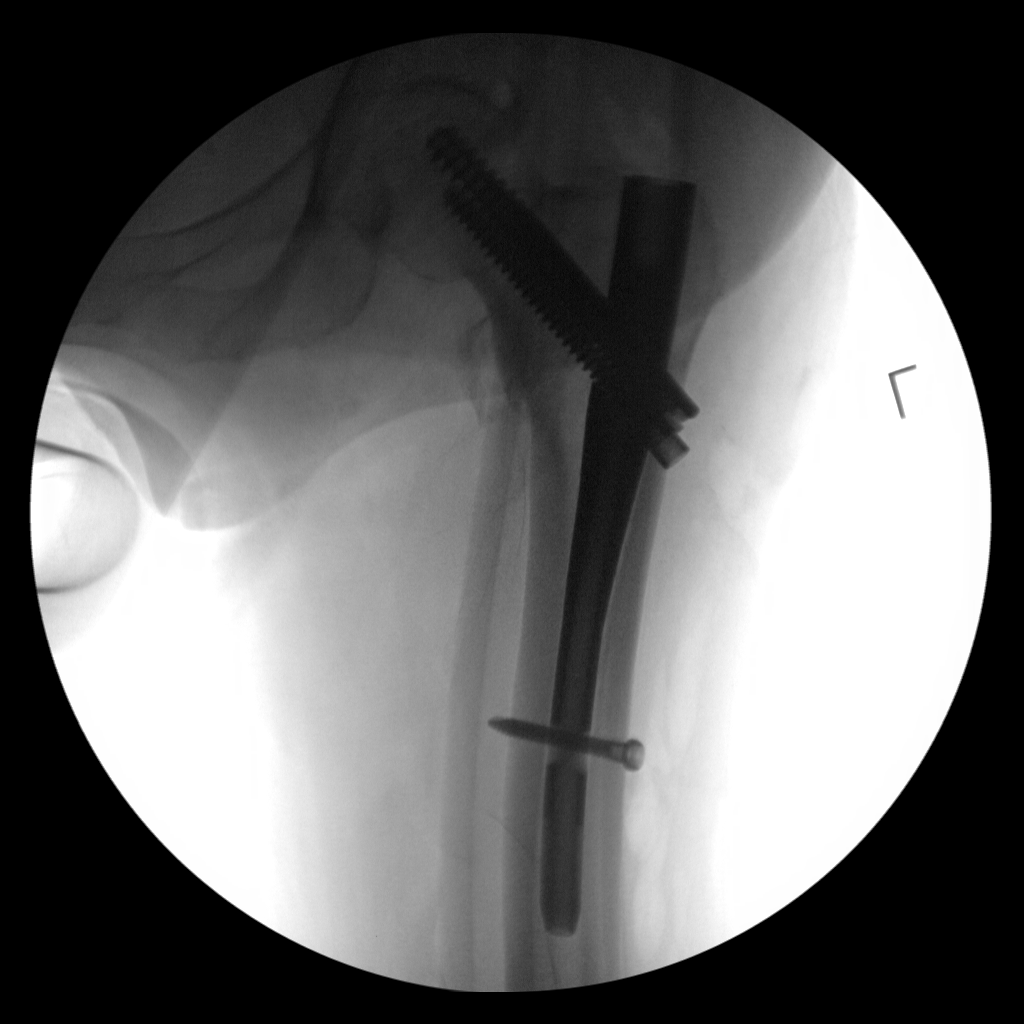
[im 4/4]
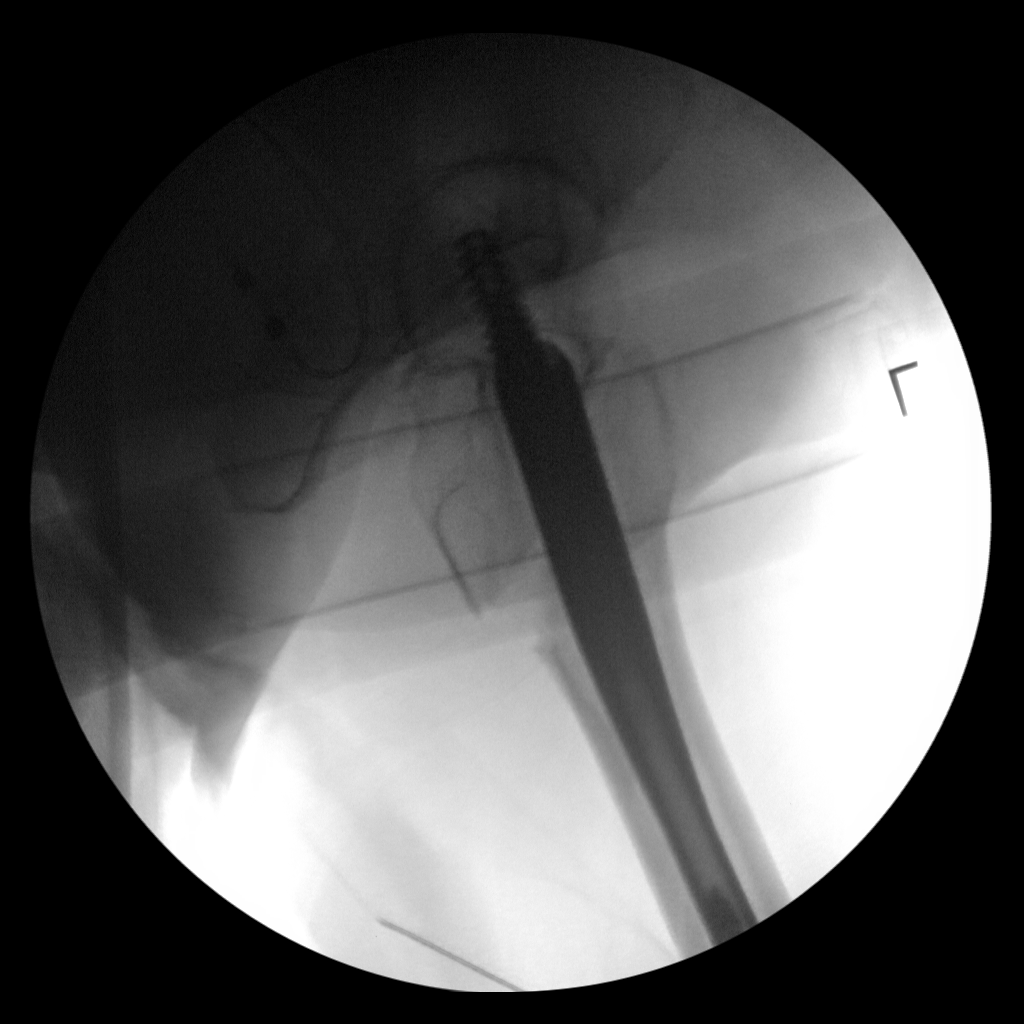

[4 of 4 positions shown; findings below may reference images not displayed]

FINDINGS: 4 intraoperative spot fluoro films obtained during ORIF for
intertrochanteric left femoral neck fracture. No evidence for
immediate hardware complication.
IMPRESSION: Intraoperative assessment during ORIF for intertrochanteric left
femoral neck fracture.

## 2023-10-16 DIAGNOSIS — M25522 Pain in left elbow: Secondary | ICD-10-CM | POA: Diagnosis not present

## 2023-10-24 DIAGNOSIS — R059 Cough, unspecified: Secondary | ICD-10-CM | POA: Diagnosis not present

## 2023-10-24 DIAGNOSIS — J019 Acute sinusitis, unspecified: Secondary | ICD-10-CM | POA: Diagnosis not present

## 2023-10-30 DIAGNOSIS — M1712 Unilateral primary osteoarthritis, left knee: Secondary | ICD-10-CM | POA: Diagnosis not present

## 2023-11-02 DIAGNOSIS — M25522 Pain in left elbow: Secondary | ICD-10-CM | POA: Diagnosis not present

## 2023-11-02 DIAGNOSIS — S52125A Nondisplaced fracture of head of left radius, initial encounter for closed fracture: Secondary | ICD-10-CM | POA: Diagnosis not present

## 2023-11-02 DIAGNOSIS — M1811 Unilateral primary osteoarthritis of first carpometacarpal joint, right hand: Secondary | ICD-10-CM | POA: Diagnosis not present

## 2023-11-09 DIAGNOSIS — M25522 Pain in left elbow: Secondary | ICD-10-CM | POA: Diagnosis not present

## 2023-11-23 DIAGNOSIS — M1811 Unilateral primary osteoarthritis of first carpometacarpal joint, right hand: Secondary | ICD-10-CM | POA: Diagnosis not present

## 2023-11-23 DIAGNOSIS — S52125A Nondisplaced fracture of head of left radius, initial encounter for closed fracture: Secondary | ICD-10-CM | POA: Diagnosis not present

## 2023-12-27 ENCOUNTER — Other Ambulatory Visit: Payer: Self-pay | Admitting: Gastroenterology

## 2023-12-27 DIAGNOSIS — K861 Other chronic pancreatitis: Secondary | ICD-10-CM

## 2024-01-01 DIAGNOSIS — M25522 Pain in left elbow: Secondary | ICD-10-CM | POA: Diagnosis not present
